# Patient Record
Sex: Male | Born: 1935 | Race: White | Hispanic: No | Marital: Married | Smoking: Former smoker
Health system: Southern US, Community
[De-identification: ages and names within clinical notes are randomized; demographics above are authoritative.]

## PROBLEM LIST (undated history)

## (undated) DIAGNOSIS — I2510 Atherosclerotic heart disease of native coronary artery without angina pectoris: Secondary | ICD-10-CM

## (undated) DIAGNOSIS — J449 Chronic obstructive pulmonary disease, unspecified: Secondary | ICD-10-CM

## (undated) DIAGNOSIS — J9601 Acute respiratory failure with hypoxia: Secondary | ICD-10-CM

## (undated) DIAGNOSIS — I495 Sick sinus syndrome: Secondary | ICD-10-CM

## (undated) DIAGNOSIS — J45909 Unspecified asthma, uncomplicated: Secondary | ICD-10-CM

## (undated) DIAGNOSIS — N200 Calculus of kidney: Secondary | ICD-10-CM

## (undated) DIAGNOSIS — I499 Cardiac arrhythmia, unspecified: Secondary | ICD-10-CM

## (undated) DIAGNOSIS — D631 Anemia in chronic kidney disease: Secondary | ICD-10-CM

## (undated) DIAGNOSIS — D472 Monoclonal gammopathy: Secondary | ICD-10-CM

## (undated) DIAGNOSIS — N189 Chronic kidney disease, unspecified: Secondary | ICD-10-CM

## (undated) DIAGNOSIS — I951 Orthostatic hypotension: Secondary | ICD-10-CM

## (undated) DIAGNOSIS — I249 Acute ischemic heart disease, unspecified: Secondary | ICD-10-CM

## (undated) DIAGNOSIS — R0602 Shortness of breath: Secondary | ICD-10-CM

## (undated) DIAGNOSIS — D509 Iron deficiency anemia, unspecified: Secondary | ICD-10-CM

## (undated) DIAGNOSIS — I480 Paroxysmal atrial fibrillation: Secondary | ICD-10-CM

## (undated) DIAGNOSIS — I672 Cerebral atherosclerosis: Secondary | ICD-10-CM

## (undated) HISTORY — DX: Chronic kidney disease, unspecified: N18.9

## (undated) HISTORY — PX: CATARACT EXTRACTION: SUR2

## (undated) HISTORY — DX: Cerebral atherosclerosis: I67.2

## (undated) HISTORY — DX: Monoclonal gammopathy: D47.2

## (undated) HISTORY — DX: Iron deficiency anemia, unspecified: D50.9

## (undated) HISTORY — DX: Sick sinus syndrome: I49.5

## (undated) HISTORY — DX: Unspecified asthma, uncomplicated: J45.909

## (undated) HISTORY — PX: INNER EAR SURGERY: SHX679

## (undated) HISTORY — PX: KIDNEY STONE SURGERY: SHX686

## (undated) HISTORY — DX: Atherosclerotic heart disease of native coronary artery without angina pectoris: I25.10

## (undated) HISTORY — DX: Paroxysmal atrial fibrillation: I48.0

## (undated) HISTORY — DX: Orthostatic hypotension: I95.1

## (undated) HISTORY — DX: Anemia in chronic kidney disease: D63.1

## (undated) HISTORY — DX: Chronic obstructive pulmonary disease, unspecified: J44.9

---

## 1998-11-04 ENCOUNTER — Encounter: Payer: Self-pay | Admitting: Emergency Medicine

## 1998-11-04 ENCOUNTER — Inpatient Hospital Stay (HOSPITAL_COMMUNITY): Admission: EM | Admit: 1998-11-04 | Discharge: 1998-11-05 | Payer: Self-pay | Admitting: Emergency Medicine

## 2000-11-05 ENCOUNTER — Emergency Department (HOSPITAL_COMMUNITY): Admission: EM | Admit: 2000-11-05 | Discharge: 2000-11-05 | Payer: Self-pay | Admitting: Emergency Medicine

## 2000-11-05 ENCOUNTER — Encounter: Payer: Self-pay | Admitting: Emergency Medicine

## 2002-08-24 ENCOUNTER — Encounter: Payer: Self-pay | Admitting: *Deleted

## 2002-08-24 ENCOUNTER — Inpatient Hospital Stay (HOSPITAL_COMMUNITY): Admission: EM | Admit: 2002-08-24 | Discharge: 2002-08-31 | Payer: Self-pay | Admitting: Emergency Medicine

## 2002-08-24 ENCOUNTER — Encounter: Payer: Self-pay | Admitting: Emergency Medicine

## 2002-08-27 ENCOUNTER — Encounter: Payer: Self-pay | Admitting: Internal Medicine

## 2002-09-19 ENCOUNTER — Observation Stay (HOSPITAL_COMMUNITY): Admission: EM | Admit: 2002-09-19 | Discharge: 2002-09-20 | Payer: Self-pay | Admitting: Emergency Medicine

## 2003-05-25 ENCOUNTER — Emergency Department (HOSPITAL_COMMUNITY): Admission: EM | Admit: 2003-05-25 | Discharge: 2003-05-25 | Payer: Self-pay | Admitting: Emergency Medicine

## 2005-12-05 ENCOUNTER — Inpatient Hospital Stay (HOSPITAL_COMMUNITY): Admission: AD | Admit: 2005-12-05 | Discharge: 2005-12-11 | Payer: Self-pay | Admitting: Cardiovascular Disease

## 2005-12-05 HISTORY — PX: CARDIAC CATHETERIZATION: SHX172

## 2005-12-06 HISTORY — PX: CORONARY ARTERY BYPASS GRAFT: SHX141

## 2005-12-26 ENCOUNTER — Emergency Department (HOSPITAL_COMMUNITY): Admission: EM | Admit: 2005-12-26 | Discharge: 2005-12-26 | Payer: Self-pay | Admitting: Emergency Medicine

## 2006-01-28 ENCOUNTER — Encounter: Admission: RE | Admit: 2006-01-28 | Discharge: 2006-01-28 | Payer: Self-pay | Admitting: Surgery

## 2006-06-06 ENCOUNTER — Ambulatory Visit (HOSPITAL_COMMUNITY): Admission: RE | Admit: 2006-06-06 | Discharge: 2006-06-06 | Payer: Self-pay | Admitting: Urology

## 2007-09-07 ENCOUNTER — Encounter: Payer: Self-pay | Admitting: Emergency Medicine

## 2008-02-17 ENCOUNTER — Ambulatory Visit (HOSPITAL_COMMUNITY): Admission: RE | Admit: 2008-02-17 | Discharge: 2008-02-17 | Payer: Self-pay | Admitting: Cardiovascular Disease

## 2008-02-17 ENCOUNTER — Encounter: Payer: Self-pay | Admitting: Emergency Medicine

## 2008-02-18 ENCOUNTER — Encounter: Payer: Self-pay | Admitting: Emergency Medicine

## 2008-02-25 ENCOUNTER — Encounter: Payer: Self-pay | Admitting: Emergency Medicine

## 2008-03-11 ENCOUNTER — Ambulatory Visit: Payer: Self-pay | Admitting: Emergency Medicine

## 2008-03-11 DIAGNOSIS — J449 Chronic obstructive pulmonary disease, unspecified: Secondary | ICD-10-CM

## 2008-03-11 DIAGNOSIS — J4489 Other specified chronic obstructive pulmonary disease: Secondary | ICD-10-CM | POA: Insufficient documentation

## 2008-03-11 DIAGNOSIS — J438 Other emphysema: Secondary | ICD-10-CM | POA: Insufficient documentation

## 2008-03-16 ENCOUNTER — Ambulatory Visit: Payer: Self-pay | Admitting: Emergency Medicine

## 2008-03-18 ENCOUNTER — Telehealth (INDEPENDENT_AMBULATORY_CARE_PROVIDER_SITE_OTHER): Payer: Self-pay | Admitting: *Deleted

## 2008-04-06 ENCOUNTER — Ambulatory Visit: Payer: Self-pay | Admitting: Emergency Medicine

## 2008-04-06 DIAGNOSIS — J309 Allergic rhinitis, unspecified: Secondary | ICD-10-CM | POA: Insufficient documentation

## 2008-05-11 ENCOUNTER — Ambulatory Visit: Payer: Self-pay | Admitting: Emergency Medicine

## 2008-05-16 ENCOUNTER — Telehealth (INDEPENDENT_AMBULATORY_CARE_PROVIDER_SITE_OTHER): Payer: Self-pay | Admitting: *Deleted

## 2008-06-06 ENCOUNTER — Encounter: Payer: Self-pay | Admitting: Emergency Medicine

## 2008-11-16 ENCOUNTER — Ambulatory Visit: Payer: Self-pay | Admitting: Emergency Medicine

## 2008-12-17 ENCOUNTER — Emergency Department (HOSPITAL_COMMUNITY): Admission: EM | Admit: 2008-12-17 | Discharge: 2008-12-17 | Payer: Self-pay | Admitting: Emergency Medicine

## 2008-12-19 ENCOUNTER — Telehealth (INDEPENDENT_AMBULATORY_CARE_PROVIDER_SITE_OTHER): Payer: Self-pay | Admitting: *Deleted

## 2008-12-21 ENCOUNTER — Ambulatory Visit: Payer: Self-pay | Admitting: Internal Medicine

## 2009-01-12 ENCOUNTER — Ambulatory Visit: Payer: Self-pay | Admitting: Emergency Medicine

## 2009-05-06 ENCOUNTER — Emergency Department (HOSPITAL_COMMUNITY): Admission: EM | Admit: 2009-05-06 | Discharge: 2009-05-06 | Payer: Self-pay | Admitting: Emergency Medicine

## 2009-05-15 ENCOUNTER — Telehealth (INDEPENDENT_AMBULATORY_CARE_PROVIDER_SITE_OTHER): Payer: Self-pay | Admitting: *Deleted

## 2009-06-01 ENCOUNTER — Ambulatory Visit: Payer: Self-pay | Admitting: Emergency Medicine

## 2009-07-16 ENCOUNTER — Inpatient Hospital Stay (HOSPITAL_COMMUNITY): Admission: EM | Admit: 2009-07-16 | Discharge: 2009-07-18 | Payer: Self-pay | Admitting: Emergency Medicine

## 2009-11-06 ENCOUNTER — Ambulatory Visit: Payer: Self-pay | Admitting: Emergency Medicine

## 2010-03-14 ENCOUNTER — Encounter: Admission: RE | Admit: 2010-03-14 | Discharge: 2010-03-14 | Payer: Self-pay | Admitting: Otolaryngology

## 2010-05-23 ENCOUNTER — Emergency Department (HOSPITAL_COMMUNITY): Admission: EM | Admit: 2010-05-23 | Discharge: 2010-05-23 | Payer: Self-pay | Admitting: Emergency Medicine

## 2010-05-25 ENCOUNTER — Ambulatory Visit: Payer: Self-pay | Admitting: Emergency Medicine

## 2010-10-01 ENCOUNTER — Inpatient Hospital Stay (HOSPITAL_COMMUNITY): Admission: EM | Admit: 2010-10-01 | Discharge: 2010-10-06 | Payer: Self-pay | Admitting: Emergency Medicine

## 2010-10-02 ENCOUNTER — Ambulatory Visit: Payer: Self-pay | Admitting: Vascular Surgery

## 2010-10-02 ENCOUNTER — Encounter (INDEPENDENT_AMBULATORY_CARE_PROVIDER_SITE_OTHER): Payer: Self-pay | Admitting: Family Medicine

## 2010-12-26 ENCOUNTER — Inpatient Hospital Stay (HOSPITAL_COMMUNITY)
Admission: EM | Admit: 2010-12-26 | Discharge: 2011-01-12 | Disposition: A | Discharge status: Nursing Home (Includes ICF) | Payer: Self-pay | Source: Home / Self Care | Attending: Internal Medicine | Admitting: Internal Medicine

## 2011-01-02 ENCOUNTER — Encounter (INDEPENDENT_AMBULATORY_CARE_PROVIDER_SITE_OTHER): Payer: Self-pay | Admitting: Internal Medicine

## 2011-01-02 LAB — BASIC METABOLIC PANEL
BUN: 13 mg/dL (ref 6–23)
CO2: 23 mEq/L (ref 19–32)
Calcium: 7.9 mg/dL — ABNORMAL LOW (ref 8.4–10.5)
Chloride: 107 mEq/L (ref 96–112)
Creatinine, Ser: 2.5 mg/dL — ABNORMAL HIGH (ref 0.4–1.5)
GFR calc Af Amer: 30 mL/min — ABNORMAL LOW (ref >60)
GFR calc non Af Amer: 25 mL/min — ABNORMAL LOW (ref >60)
Glucose, Bld: 117 mg/dL — ABNORMAL HIGH (ref 70–99)
Potassium: 3.4 mEq/L — ABNORMAL LOW (ref 3.5–5.1)
Sodium: 136 mEq/L (ref 135–145)

## 2011-01-03 LAB — BASIC METABOLIC PANEL
BUN: 14 mg/dL (ref 6–23)
CO2: 23 mEq/L (ref 19–32)
Calcium: 7.9 mg/dL — ABNORMAL LOW (ref 8.4–10.5)
Chloride: 108 mEq/L (ref 96–112)
Creatinine, Ser: 2.48 mg/dL — ABNORMAL HIGH (ref 0.4–1.5)
GFR calc Af Amer: 31 mL/min — ABNORMAL LOW (ref >60)
GFR calc non Af Amer: 25 mL/min — ABNORMAL LOW (ref >60)
Glucose, Bld: 136 mg/dL — ABNORMAL HIGH (ref 70–99)
Potassium: 3.7 mEq/L (ref 3.5–5.1)
Sodium: 137 mEq/L (ref 135–145)

## 2011-01-03 LAB — CBC
HCT: 31.2 % — ABNORMAL LOW (ref 39.0–52.0)
Hemoglobin: 10.4 g/dL — ABNORMAL LOW (ref 13.0–17.0)
MCH: 30.1 pg (ref 26.0–34.0)
MCHC: 33.3 g/dL (ref 30.0–36.0)
MCV: 90.4 fL (ref 78.0–100.0)
Platelets: 174 10*3/uL (ref 150–400)
RBC: 3.45 MIL/uL — ABNORMAL LOW (ref 4.22–5.81)
RDW: 13.3 % (ref 11.5–15.5)
WBC: 9.6 10*3/uL (ref 4.0–10.5)

## 2011-01-03 LAB — CREATININE, URINE, RANDOM: Creatinine, Urine: 66.3 mg/dL

## 2011-01-03 LAB — SODIUM, URINE, RANDOM: Sodium, Ur: 153 mEq/L

## 2011-01-04 LAB — BASIC METABOLIC PANEL
BUN: 13 mg/dL (ref 6–23)
CO2: 26 mEq/L (ref 19–32)
Calcium: 7.9 mg/dL — ABNORMAL LOW (ref 8.4–10.5)
Chloride: 108 mEq/L (ref 96–112)
Creatinine, Ser: 2.49 mg/dL — ABNORMAL HIGH (ref 0.4–1.5)
GFR calc Af Amer: 30 mL/min — ABNORMAL LOW (ref >60)
GFR calc non Af Amer: 25 mL/min — ABNORMAL LOW (ref >60)
Glucose, Bld: 119 mg/dL — ABNORMAL HIGH (ref 70–99)
Potassium: 3.6 mEq/L (ref 3.5–5.1)
Sodium: 139 mEq/L (ref 135–145)

## 2011-01-04 LAB — CBC
HCT: 29.4 % — ABNORMAL LOW (ref 39.0–52.0)
Hemoglobin: 9.7 g/dL — ABNORMAL LOW (ref 13.0–17.0)
MCH: 29.9 pg (ref 26.0–34.0)
MCHC: 33 g/dL (ref 30.0–36.0)
MCV: 90.7 fL (ref 78.0–100.0)
Platelets: 197 10*3/uL (ref 150–400)
RBC: 3.24 MIL/uL — ABNORMAL LOW (ref 4.22–5.81)
RDW: 13.4 % (ref 11.5–15.5)
WBC: 10.1 10*3/uL (ref 4.0–10.5)

## 2011-01-12 ENCOUNTER — Inpatient Hospital Stay
Admission: RE | Admit: 2011-01-12 | Discharge: 2011-02-07 | Disposition: A | Discharge status: Skilled Nursing Facility | Payer: Self-pay | Attending: Internal Medicine | Admitting: Internal Medicine

## 2011-01-14 LAB — PROTEIN ELECTROPHORESIS, SERUM
Albumin ELP: 48 % — ABNORMAL LOW (ref 55.8–66.1)
Alpha-1-Globulin: 8.7 % — ABNORMAL HIGH (ref 2.9–4.9)
Alpha-2-Globulin: 18.6 % — ABNORMAL HIGH (ref 7.1–11.8)
Beta 2: 4 % (ref 3.2–6.5)
Beta Globulin: 5.2 % (ref 4.7–7.2)
Gamma Globulin: 15.5 % (ref 11.1–18.8)
M-Spike, %: 0.24 g/dL
Total Protein ELP: 4.6 g/dL — ABNORMAL LOW (ref 6.0–8.3)

## 2011-01-14 LAB — BASIC METABOLIC PANEL
BUN: 16 mg/dL (ref 6–23)
BUN: 20 mg/dL (ref 6–23)
BUN: 17 mg/dL (ref 6–23)
BUN: 15 mg/dL (ref 6–23)
BUN: 15 mg/dL (ref 6–23)
BUN: 13 mg/dL (ref 6–23)
CO2: 28 mEq/L (ref 19–32)
CO2: 27 mEq/L (ref 19–32)
CO2: 25 mEq/L (ref 19–32)
CO2: 26 mEq/L (ref 19–32)
CO2: 25 mEq/L (ref 19–32)
CO2: 26 mEq/L (ref 19–32)
Calcium: 8.1 mg/dL — ABNORMAL LOW (ref 8.4–10.5)
Calcium: 8 mg/dL — ABNORMAL LOW (ref 8.4–10.5)
Calcium: 8.3 mg/dL — ABNORMAL LOW (ref 8.4–10.5)
Calcium: 8 mg/dL — ABNORMAL LOW (ref 8.4–10.5)
Calcium: 8 mg/dL — ABNORMAL LOW (ref 8.4–10.5)
Calcium: 8.2 mg/dL — ABNORMAL LOW (ref 8.4–10.5)
Chloride: 101 mEq/L (ref 96–112)
Chloride: 106 mEq/L (ref 96–112)
Chloride: 105 mEq/L (ref 96–112)
Chloride: 107 mEq/L (ref 96–112)
Chloride: 105 mEq/L (ref 96–112)
Chloride: 108 mEq/L (ref 96–112)
Creatinine, Ser: 2.74 mg/dL — ABNORMAL HIGH (ref 0.4–1.5)
Creatinine, Ser: 2.64 mg/dL — ABNORMAL HIGH (ref 0.4–1.5)
Creatinine, Ser: 2.35 mg/dL — ABNORMAL HIGH (ref 0.4–1.5)
Creatinine, Ser: 1.79 mg/dL — ABNORMAL HIGH (ref 0.4–1.5)
Creatinine, Ser: 1.48 mg/dL (ref 0.4–1.5)
Creatinine, Ser: 1.35 mg/dL (ref 0.4–1.5)
GFR calc Af Amer: 27 mL/min — ABNORMAL LOW (ref >60)
GFR calc Af Amer: 28 mL/min — ABNORMAL LOW (ref >60)
GFR calc Af Amer: 33 mL/min — ABNORMAL LOW (ref >60)
GFR calc Af Amer: 45 mL/min — ABNORMAL LOW (ref >60)
GFR calc Af Amer: 55 mL/min — ABNORMAL LOW (ref >60)
GFR calc Af Amer: >60 mL/min (ref >60)
GFR calc non Af Amer: 23 mL/min — ABNORMAL LOW (ref >60)
GFR calc non Af Amer: 24 mL/min — ABNORMAL LOW (ref >60)
GFR calc non Af Amer: 27 mL/min — ABNORMAL LOW (ref >60)
GFR calc non Af Amer: 37 mL/min — ABNORMAL LOW (ref >60)
GFR calc non Af Amer: 46 mL/min — ABNORMAL LOW (ref >60)
GFR calc non Af Amer: 51 mL/min — ABNORMAL LOW (ref >60)
Glucose, Bld: 117 mg/dL — ABNORMAL HIGH (ref 70–99)
Glucose, Bld: 111 mg/dL — ABNORMAL HIGH (ref 70–99)
Glucose, Bld: 115 mg/dL — ABNORMAL HIGH (ref 70–99)
Glucose, Bld: 113 mg/dL — ABNORMAL HIGH (ref 70–99)
Glucose, Bld: 116 mg/dL — ABNORMAL HIGH (ref 70–99)
Glucose, Bld: 103 mg/dL — ABNORMAL HIGH (ref 70–99)
Potassium: 3.3 mEq/L — ABNORMAL LOW (ref 3.5–5.1)
Potassium: 3.5 mEq/L (ref 3.5–5.1)
Potassium: 4.2 mEq/L (ref 3.5–5.1)
Potassium: 3.2 mEq/L — ABNORMAL LOW (ref 3.5–5.1)
Potassium: 3.1 mEq/L — ABNORMAL LOW (ref 3.5–5.1)
Potassium: 3.2 mEq/L — ABNORMAL LOW (ref 3.5–5.1)
Sodium: 137 mEq/L (ref 135–145)
Sodium: 141 mEq/L (ref 135–145)
Sodium: 137 mEq/L (ref 135–145)
Sodium: 140 mEq/L (ref 135–145)
Sodium: 138 mEq/L (ref 135–145)
Sodium: 140 mEq/L (ref 135–145)

## 2011-01-14 LAB — CBC
HCT: 30.1 % — ABNORMAL LOW (ref 39.0–52.0)
HCT: 34.6 % — ABNORMAL LOW (ref 39.0–52.0)
HCT: 29 % — ABNORMAL LOW (ref 39.0–52.0)
HCT: 30.1 % — ABNORMAL LOW (ref 39.0–52.0)
HCT: 29.1 % — ABNORMAL LOW (ref 39.0–52.0)
HCT: 30.4 % — ABNORMAL LOW (ref 39.0–52.0)
HCT: 28.4 % — ABNORMAL LOW (ref 39.0–52.0)
Hemoglobin: 9.8 g/dL — ABNORMAL LOW (ref 13.0–17.0)
Hemoglobin: 11.2 g/dL — ABNORMAL LOW (ref 13.0–17.0)
Hemoglobin: 9.5 g/dL — ABNORMAL LOW (ref 13.0–17.0)
Hemoglobin: 9.7 g/dL — ABNORMAL LOW (ref 13.0–17.0)
Hemoglobin: 9.5 g/dL — ABNORMAL LOW (ref 13.0–17.0)
Hemoglobin: 9.9 g/dL — ABNORMAL LOW (ref 13.0–17.0)
Hemoglobin: 9.2 g/dL — ABNORMAL LOW (ref 13.0–17.0)
MCH: 29.6 pg (ref 26.0–34.0)
MCH: 29.5 pg (ref 26.0–34.0)
MCH: 30.1 pg (ref 26.0–34.0)
MCH: 29.5 pg (ref 26.0–34.0)
MCH: 29.5 pg (ref 26.0–34.0)
MCH: 29.6 pg (ref 26.0–34.0)
MCH: 29.3 pg (ref 26.0–34.0)
MCHC: 32.6 g/dL (ref 30.0–36.0)
MCHC: 32.4 g/dL (ref 30.0–36.0)
MCHC: 32.8 g/dL (ref 30.0–36.0)
MCHC: 32.2 g/dL (ref 30.0–36.0)
MCHC: 32.6 g/dL (ref 30.0–36.0)
MCHC: 32.6 g/dL (ref 30.0–36.0)
MCHC: 32.4 g/dL (ref 30.0–36.0)
MCV: 90.9 fL (ref 78.0–100.0)
MCV: 91.1 fL (ref 78.0–100.0)
MCV: 91.8 fL (ref 78.0–100.0)
MCV: 91.5 fL (ref 78.0–100.0)
MCV: 90.4 fL (ref 78.0–100.0)
MCV: 91 fL (ref 78.0–100.0)
MCV: 90.4 fL (ref 78.0–100.0)
Platelets: 248 10*3/uL (ref 150–400)
Platelets: 298 10*3/uL (ref 150–400)
Platelets: 291 10*3/uL (ref 150–400)
Platelets: 341 10*3/uL (ref 150–400)
Platelets: 335 10*3/uL (ref 150–400)
Platelets: 356 10*3/uL (ref 150–400)
Platelets: 373 10*3/uL (ref 150–400)
RBC: 3.31 MIL/uL — ABNORMAL LOW (ref 4.22–5.81)
RBC: 3.8 MIL/uL — ABNORMAL LOW (ref 4.22–5.81)
RBC: 3.16 MIL/uL — ABNORMAL LOW (ref 4.22–5.81)
RBC: 3.29 MIL/uL — ABNORMAL LOW (ref 4.22–5.81)
RBC: 3.22 MIL/uL — ABNORMAL LOW (ref 4.22–5.81)
RBC: 3.34 MIL/uL — ABNORMAL LOW (ref 4.22–5.81)
RBC: 3.14 MIL/uL — ABNORMAL LOW (ref 4.22–5.81)
RDW: 13.6 % (ref 11.5–15.5)
RDW: 13.8 % (ref 11.5–15.5)
RDW: 13.9 % (ref 11.5–15.5)
RDW: 14 % (ref 11.5–15.5)
RDW: 14.1 % (ref 11.5–15.5)
RDW: 14.5 % (ref 11.5–15.5)
RDW: 14.2 % (ref 11.5–15.5)
WBC: 10.7 10*3/uL — ABNORMAL HIGH (ref 4.0–10.5)
WBC: 12.7 10*3/uL — ABNORMAL HIGH (ref 4.0–10.5)
WBC: 10.9 10*3/uL — ABNORMAL HIGH (ref 4.0–10.5)
WBC: 13.4 10*3/uL — ABNORMAL HIGH (ref 4.0–10.5)
WBC: 10.4 10*3/uL (ref 4.0–10.5)
WBC: 9.6 10*3/uL (ref 4.0–10.5)
WBC: 8.5 10*3/uL (ref 4.0–10.5)

## 2011-01-14 LAB — COMPREHENSIVE METABOLIC PANEL
ALT: 25 U/L (ref 0–53)
ALT: 19 U/L (ref 0–53)
AST: 28 U/L (ref 0–37)
AST: 23 U/L (ref 0–37)
Albumin: 2.4 g/dL — ABNORMAL LOW (ref 3.5–5.2)
Albumin: 2.1 g/dL — ABNORMAL LOW (ref 3.5–5.2)
Alkaline Phosphatase: 90 U/L (ref 39–117)
Alkaline Phosphatase: 101 U/L (ref 39–117)
BUN: 17 mg/dL (ref 6–23)
BUN: 19 mg/dL (ref 6–23)
CO2: 25 mEq/L (ref 19–32)
CO2: 23 mEq/L (ref 19–32)
Calcium: 8.2 mg/dL — ABNORMAL LOW (ref 8.4–10.5)
Calcium: 8 mg/dL — ABNORMAL LOW (ref 8.4–10.5)
Chloride: 106 mEq/L (ref 96–112)
Chloride: 108 mEq/L (ref 96–112)
Creatinine, Ser: 2.47 mg/dL — ABNORMAL HIGH (ref 0.4–1.5)
Creatinine, Ser: 2.41 mg/dL — ABNORMAL HIGH (ref 0.4–1.5)
GFR calc Af Amer: 31 mL/min — ABNORMAL LOW (ref >60)
GFR calc Af Amer: 32 mL/min — ABNORMAL LOW (ref >60)
GFR calc non Af Amer: 25 mL/min — ABNORMAL LOW (ref >60)
GFR calc non Af Amer: 26 mL/min — ABNORMAL LOW (ref >60)
Glucose, Bld: 108 mg/dL — ABNORMAL HIGH (ref 70–99)
Glucose, Bld: 110 mg/dL — ABNORMAL HIGH (ref 70–99)
Potassium: 3.4 mEq/L — ABNORMAL LOW (ref 3.5–5.1)
Potassium: 3.5 mEq/L (ref 3.5–5.1)
Sodium: 139 mEq/L (ref 135–145)
Sodium: 139 mEq/L (ref 135–145)
Total Bilirubin: 0.6 mg/dL (ref 0.3–1.2)
Total Bilirubin: 0.6 mg/dL (ref 0.3–1.2)
Total Protein: 5.5 g/dL — ABNORMAL LOW (ref 6.0–8.3)
Total Protein: 4.7 g/dL — ABNORMAL LOW (ref 6.0–8.3)

## 2011-01-14 LAB — PHOSPHORUS
Phosphorus: 3.3 mg/dL (ref 2.3–4.6)
Phosphorus: 2.4 mg/dL (ref 2.3–4.6)
Phosphorus: 2.7 mg/dL (ref 2.3–4.6)

## 2011-01-14 LAB — LACTIC ACID, PLASMA: Lactic Acid, Venous: 0.9 mmol/L (ref 0.5–2.2)

## 2011-01-14 LAB — UIFE/LIGHT CHAINS/TP QN, 24-HR UR
Albumin, U: DETECTED
Alpha 1, Urine: DETECTED — AB
Alpha 2, Urine: DETECTED — AB
Beta, Urine: DETECTED — AB
Free Kappa Lt Chains,Ur: 39.2 mg/dL — ABNORMAL HIGH (ref 0.04–1.51)
Free Kappa/Lambda Ratio: 2.48 ratio (ref 0.46–4.00)
Free Lambda Excretion/Day: 288.35 mg/d
Free Lambda Lt Chains,Ur: 15.8 mg/dL — ABNORMAL HIGH (ref 0.08–1.01)
Free Lt Chn Excr Rate: 715.4 mg/d
Gamma Globulin, Urine: DETECTED — AB
Time: 24 hours
Total Protein, Urine-Ur/day: 1088 mg/d — ABNORMAL HIGH (ref 10–140)
Total Protein, Urine: 59.6 mg/dL
Volume, Urine: 1825 mL

## 2011-01-14 LAB — AMYLASE: Amylase: 37 U/L (ref 0–105)

## 2011-01-14 LAB — MAGNESIUM
Magnesium: 1.4 mg/dL — ABNORMAL LOW (ref 1.5–2.5)
Magnesium: 1.9 mg/dL (ref 1.5–2.5)

## 2011-01-14 LAB — BRAIN NATRIURETIC PEPTIDE: Pro B Natriuretic peptide (BNP): 221 pg/mL — ABNORMAL HIGH (ref 0.0–100.0)

## 2011-01-14 LAB — LIPASE, BLOOD: Lipase: 23 U/L (ref 11–59)

## 2011-01-14 LAB — URIC ACID: Uric Acid, Serum: 4 mg/dL (ref 4.0–7.8)

## 2011-01-14 LAB — ALBUMIN: Albumin: 2.2 g/dL — ABNORMAL LOW (ref 3.5–5.2)

## 2011-01-16 LAB — BASIC METABOLIC PANEL
BUN: 12 mg/dL (ref 6–23)
BUN: 15 mg/dL (ref 6–23)
CO2: 23 mEq/L (ref 19–32)
CO2: 24 mEq/L (ref 19–32)
Calcium: 8.2 mg/dL — ABNORMAL LOW (ref 8.4–10.5)
Calcium: 8.7 mg/dL (ref 8.4–10.5)
Chloride: 105 mEq/L (ref 96–112)
Chloride: 109 mEq/L (ref 96–112)
Creatinine, Ser: 1.26 mg/dL (ref 0.4–1.5)
Creatinine, Ser: 1.26 mg/dL (ref 0.4–1.5)
GFR calc Af Amer: >60 mL/min (ref >60)
GFR calc Af Amer: >60 mL/min (ref >60)
GFR calc non Af Amer: 55 mL/min — ABNORMAL LOW (ref >60)
GFR calc non Af Amer: 55 mL/min — ABNORMAL LOW (ref >60)
Glucose, Bld: 103 mg/dL — ABNORMAL HIGH (ref 70–99)
Glucose, Bld: 95 mg/dL (ref 70–99)
Potassium: 3.3 mEq/L — ABNORMAL LOW (ref 3.5–5.1)
Potassium: 4.1 mEq/L (ref 3.5–5.1)
Sodium: 136 mEq/L (ref 135–145)
Sodium: 139 mEq/L (ref 135–145)

## 2011-01-16 LAB — MAGNESIUM: Magnesium: 1.6 mg/dL (ref 1.5–2.5)

## 2011-01-16 LAB — PREALBUMIN: Prealbumin: 22.8 mg/dL (ref 17.0–34.0)

## 2011-01-16 LAB — CBC
HCT: 29.1 % — ABNORMAL LOW (ref 39.0–52.0)
HCT: 29.6 % — ABNORMAL LOW (ref 39.0–52.0)
Hemoglobin: 9.4 g/dL — ABNORMAL LOW (ref 13.0–17.0)
Hemoglobin: 9.7 g/dL — ABNORMAL LOW (ref 13.0–17.0)
MCH: 29.3 pg (ref 26.0–34.0)
MCH: 29.5 pg (ref 26.0–34.0)
MCHC: 32.3 g/dL (ref 30.0–36.0)
MCHC: 32.8 g/dL (ref 30.0–36.0)
MCV: 90.7 fL (ref 78.0–100.0)
MCV: 90 fL (ref 78.0–100.0)
Platelets: 381 10*3/uL (ref 150–400)
Platelets: 383 10*3/uL (ref 150–400)
RBC: 3.21 MIL/uL — ABNORMAL LOW (ref 4.22–5.81)
RBC: 3.29 MIL/uL — ABNORMAL LOW (ref 4.22–5.81)
RDW: 14.3 % (ref 11.5–15.5)
RDW: 14.4 % (ref 11.5–15.5)
WBC: 7.6 10*3/uL (ref 4.0–10.5)
WBC: 7 10*3/uL (ref 4.0–10.5)

## 2011-01-16 LAB — PHOSPHORUS: Phosphorus: 3.4 mg/dL (ref 2.3–4.6)

## 2011-01-16 LAB — CLOSTRIDIUM DIFFICILE BY PCR: Toxigenic C. Difficile by PCR: NEGATIVE

## 2011-01-21 LAB — CBC
HCT: 30.3 % — ABNORMAL LOW (ref 39.0–52.0)
Hemoglobin: 9.5 g/dL — ABNORMAL LOW (ref 13.0–17.0)
MCHC: 31.4 g/dL (ref 30.0–36.0)
MCV: 92.1 fL (ref 78.0–100.0)
Platelets: 335 10*3/uL (ref 150–400)
RBC: 3.29 MIL/uL — ABNORMAL LOW (ref 4.22–5.81)
RDW: 14.5 % (ref 11.5–15.5)
WBC: 5.7 10*3/uL (ref 4.0–10.5)

## 2011-01-22 LAB — BASIC METABOLIC PANEL
BUN: 15 mg/dL (ref 6–23)
CO2: 29 mEq/L (ref 19–32)
Calcium: 8.7 mg/dL (ref 8.4–10.5)
Chloride: 103 mEq/L (ref 96–112)
GFR calc Af Amer: >60 mL/min (ref >60)
GFR calc non Af Amer: 54 mL/min — ABNORMAL LOW (ref >60)
Glucose, Bld: 113 mg/dL — ABNORMAL HIGH (ref 70–99)
Potassium: 4.2 mEq/L (ref 3.5–5.1)
Sodium: 138 mEq/L (ref 135–145)

## 2011-01-24 LAB — CBC
HCT: 28.6 % — ABNORMAL LOW (ref 39.0–52.0)
Hemoglobin: 9.3 g/dL — ABNORMAL LOW (ref 13.0–17.0)
MCH: 30.2 pg (ref 26.0–34.0)
MCHC: 32.5 g/dL (ref 30.0–36.0)
MCV: 92.9 fL (ref 78.0–100.0)

## 2011-01-24 LAB — BASIC METABOLIC PANEL
CO2: 29 mEq/L (ref 19–32)
Calcium: 8.9 mg/dL (ref 8.4–10.5)
Creatinine, Ser: 1.18 mg/dL (ref 0.4–1.5)
Glucose, Bld: 106 mg/dL — ABNORMAL HIGH (ref 70–99)

## 2011-01-24 NOTE — Progress Notes (Addendum)
NAMEJAMARIEN, RODKEY            ACCOUNT NO.:  [id number]  MEDICAL RECORD NO.:  [id number]          PATIENT TYPE:  INP  LOCATION:  1535                         FACILITY:  [facility]  PHYSICIAN:  Penne Rosenstock I Laisa Larrick, MD      DATE OF BIRTH:  Feb 06, 1929                                PROGRESS NOTE   DISCHARGE DIAGNOSES: 1. Clostridium difficile colitis. 2. Acute renal failure on chronic renal failure.  Baseline creatinine     1.5.  Today creatinine is 2.4. 3. Left proximal ureteric 1-cm stone, caused moderate left-sided     hydronephrosis. 4. Bilateral renal collecting system calculi. 5. Leukocytosis. 6. Anemia. 7. Hypertension. 8. Coronary artery disease status post coronary artery bypass graft. 9. History of emphysema. 10.Possible bladder outlet obstruction.  MEDICATIONS: To be dictated at the date of actual discharge.  CONSULTATIONS: 1. Gastroenterology consulted. 2. Nephrology consulted done by [facility] C. Lowell Guitar, M.D. 3. Urology consulted done by Courtney Paris, M.D., the patient's     urologist.  HISTORY OF PRESENT ILLNESS: This is a 77 year old male with a history of coronary artery disease status post CABG, hypertension, hyperlipidemia, emphysema, recurrent kidney stones who presented with abdominal pain to the hospital.  On Sunday he tried to move his bowels.  He was not able to move after which he started developing abdominal pain.  Pain mainly in the left lower quadrant.  He denies any fever or chills.  He has not used any antibiotics recently.  The patient has a CT scan which shows colitis.  PROCEDURES: 1. CT abdomen and pelvis with contrast:  Diffuse thickening of the     colon most prominent in the cecum.  This is compatible with colitis     and could be due to infectious, possible ischemia. 2. X-ray of the abdomen:  Mild bowel thickening involving the cecum     and ascending colon without acute findings. 3. Ultrasound of the kidney:  No hydronephrosis.  Small  bilateral     cysts.  Some bilateral nonobstructive stones. 4. CT of the abdomen and pelvis without contrast:  Left proximal     ureteric 1-cm stone causing moderate left-sided hydronephrosis.     This study was __________ report, increased ascending colitis     without obstruction or abscess.  Development of a small volume     abdomen and pelvic ascites.  New small bilateral effusion. 5. KUB.  Irregularity of the colon may be related to under distention     and/or colitis.  Appearance is less notable than that seen on the     CT scan.  HOSPITAL COURSE: Abdominal pain.  Colitis versus ischemia.  The patient was admitted to the hospital and started on Cipro and Flagyl and a clear-liquid diet. Gastroenterology consulted and the patient had a stool study for Clostridium difficile, which ended up positive.  The patient was started on Flagyl and Gastroenterology signed off.  His bowel is formed, but the patient continued to complain of left lower quadrant abdominal pain. Status post another x-ray and on his examination the abdomen looked distended, with present bowel sounds.  No rebound.  No guarding.  A repeat abdominal x-ray was negative.  Also, note that the patient has worsening of his renal function.  Renal function felt to be could be secondary to contrast versus bladder outlet obstruction.  The patient had a bladder scan which did show 360 and Foley catheter in varices. The patient has a significant amount of urine.  The patient on nonoliguric phase of renal insufficiency.  We tried with the patient IV fluid to improve his renal function, but renal function remained the same.  Nephrology consulted and as per discussion with Dr. Lowell Guitar he thinks it will improve gradually.  Anyhow secondary to worsening of the patient's left lower quadrant abdominal pain, CT scan without contrast was done which did show left ureteric stone in addition to moderate hydronephrosis.  The patient's  urologist, Dr. Aldean Ast, did see the patient and a KUB was done.  Dr. Aldean Ast for now did not feel the pain is secondary to the ureteric stone, but per his recommendation, may need placement of a stent during hospital stay after reviewing the CT scan. Also, I had discussion with __________ of [place] for courtesy visit just to evaluate the patient, but I have discussed with the patient regarding his diarrhea and the nurses did not feel this.  It is more softer, it is greenish, and it is firm rather than diarrhea.  They are not so frequent.  There are just 2 bowel movements.  Yes, the patient has bowel movement on January 06, 2011, and this was felt to be secondary to the stool softener status post stopping them.  Accordingly, we will continue to monitor the patient during the hospital stay for further evaluation by Dr. Aldean Ast and if the kidney function did not improve most probably the patient will be discharged to follow up with his primary care physician.     Genice Kimberlin Bosie Helper, MD     HIE/MEDQ  D:  01/08/2011  T:  01/09/2011  Job:  [id number]  Electronically Signed by Ebony Cargo MD on 01/24/2011 01:59:30 PM

## 2011-01-26 LAB — BASIC METABOLIC PANEL
BUN: 12 mg/dL (ref 6–23)
Chloride: 104 mEq/L (ref 96–112)
Creatinine, Ser: 1.13 mg/dL (ref 0.4–1.5)

## 2011-01-26 LAB — CBC
MCH: 29.9 pg (ref 26.0–34.0)
MCV: 93 fL (ref 78.0–100.0)
Platelets: 163 10*3/uL (ref 150–400)
RBC: 2.98 MIL/uL — ABNORMAL LOW (ref 4.22–5.81)
RDW: 15.1 % (ref 11.5–15.5)

## 2011-01-27 LAB — CLOSTRIDIUM DIFFICILE BY PCR: Toxigenic C. Difficile by PCR: NEGATIVE

## 2011-01-28 LAB — BASIC METABOLIC PANEL
BUN: 11 mg/dL (ref 6–23)
Calcium: 8.7 mg/dL (ref 8.4–10.5)
Creatinine, Ser: 1.11 mg/dL (ref 0.4–1.5)
GFR calc non Af Amer: >60 mL/min (ref >60)
Glucose, Bld: 112 mg/dL — ABNORMAL HIGH (ref 70–99)
Sodium: 139 mEq/L (ref 135–145)

## 2011-01-28 LAB — CBC
HCT: 27 % — ABNORMAL LOW (ref 39.0–52.0)
MCH: 29.9 pg (ref 26.0–34.0)
MCHC: 32.2 g/dL (ref 30.0–36.0)
RDW: 15.1 % (ref 11.5–15.5)

## 2011-01-29 LAB — FECAL LACTOFERRIN, QUANT: Fecal Lactoferrin: NEGATIVE

## 2011-01-30 LAB — STOOL CULTURE

## 2011-01-31 LAB — BASIC METABOLIC PANEL
CO2: 29 mEq/L (ref 19–32)
Chloride: 102 mEq/L (ref 96–112)
GFR calc Af Amer: >60 mL/min (ref >60)
Potassium: 3.3 mEq/L — ABNORMAL LOW (ref 3.5–5.1)
Sodium: 142 mEq/L (ref 135–145)

## 2011-01-31 LAB — CBC
Hemoglobin: 9.7 g/dL — ABNORMAL LOW (ref 13.0–17.0)
Platelets: 192 10*3/uL (ref 150–400)
RBC: 3.28 MIL/uL — ABNORMAL LOW (ref 4.22–5.81)
WBC: 4.9 10*3/uL (ref 4.0–10.5)

## 2011-01-31 NOTE — Assessment & Plan Note (Signed)
Summary: COPD   Visit Type:  Follow-up Copy to:  Dr Tresa Endo Primary Provider/Referring Provider:  Dr. Nathanial Rancher in [place]  CC:  COPD.  The patient says he is more sob due to higher outside temperatures but overall seems to be doing well.Marland Kitchen  History of Present Illness: 77yo man with long tobacco hx, CAD/CABG (06) followed by Dr Tresa Endo.    01/12/09 - was restarted on Symbicort 160/4.5 two times a day by Dr Sherene Sires last time. Feels his breathing is better, not using ProAir frequently. Also on Spiriva once a day.   06/01/09 -- Regular follow up visit. Has been doing well since last visit. No exertional dyspnea, able to participate in [employer]. No wheezing, no coughing.   ROV 11/06/09 -- Returns today for regular follow up. Has been doing well, takes Spiriva + Symbicort. Stays very active. Hasn't required ProAir at all. Continues to follow with Dr Tresa Endo - he is due for a stress test and follow up. Has been having more bruising when he bumps his hands.    ROV 05/21/10 -- returns for f/u COPD. last time we changed Symbicort to 80/4.5 two times a day. Also on Spiriva. Still having SOB with exertion, like working in the shed, the yard. He has not had any exacerbations. Family is concerned that he may have lost some ground since last visit in exertional tolerance. Still competes in the [employer].    Current Medications (verified): 1)  Metoprolol Tartrate 25 Mg  Tabs (Metoprolol Tartrate) .... Take 1 Tablet By Mouth Two Times A Day 2)  Crestor 20 Mg Tabs (Rosuvastatin Calcium) .Marland Kitchen.. 1 By Mouth Daily 3)  Aspirin 81 Mg Tabs (Aspirin) .Marland Kitchen.. 1 By Mouth Daily 4)  Niaspan 500 Mg  Tbcr (Niacin (Antihyperlipidemic)) .... Take 1 Tablet By Mouth Once A Day 5)  Nitroquick 0.4 Mg  Subl (Nitroglycerin) .... As Needed 6)  Proair Hfa 108 (90 Base) Mcg/act  Aers (Albuterol Sulfate) .... Inhale 2 Puffs Every 4 Hours As Needed 7)  Spiriva Handihaler 18 Mcg  Caps (Tiotropium Bromide Monohydrate) .... Take 1 Capsule  Each  Am 8)  Symbicort 80-4.5 Mcg/act Aero (Budesonide-Formoterol Fumarate) .... 2 Puffs Two Times A Day 9)  Tamsulosin Hcl 0.4 Mg Caps (Tamsulosin Hcl) .Marland Kitchen.. 1 By Mouth Daily 10)  Cvs Spectravite Senior  Tabs (Multiple Vitamins-Minerals) .Marland Kitchen.. 1 By Mouth Daily  Allergies (verified): 1)  ! Diclofenac Sodium (Diclofenac Sodium)  Vital Signs:  Patient profile:   77 year old male Height:      71 inches (180.34 cm) Weight:      194 pounds (88.18 kg) BMI:     27.16 O2 Sat:      91 % on Room air Temp:     98.3 degrees F (36.83 degrees C) oral Pulse rate:   84 / minute BP sitting:   118 / 80  (right arm) Cuff size:   regular  Vitals Entered By: Michel Bickers CMA (May 25, 2010 12:06 PM)  O2 Sat at Rest %:  91 O2 Flow:  Room air  Serial Vital Signs/Assessments:  Comments: 12:32 PM Ambulatory Pulse Oximetry  Resting; HR__79___    02 Sat__93% on room air___  Lap1 (185 feet)   HR_88____   02 Sat__93% on room air___ Lap2 (185 feet)   HR__97___   02 Sat_91% on room air____    Lap3 (185 feet)   HR_99____   02 Sat__92% on room air___  _x__Test Completed without Difficulty ___Test Stopped due to:  By:  Michel Bickers CMA   CC: COPD.  The patient says he is more sob due to higher outside temperatures but overall seems to be doing well. Comments Medications reviewed.  Daytime phone verified. Michel Bickers CMA  May 25, 2010 12:07 PM   Physical Exam  General:  Tall thin man, NAD Head:  normocephalic and atraumatic Nose:  no deformity, discharge, inflammation, or lesions Mouth:  no deformity or lesions Neck:  no masses, thyromegaly, or abnormal cervical nodes Lungs:  Clear bilaterally, distant, no wheeze on forced expiration. Heart:  regular rate and rhythm, S1, S2 without murmurs, rubs, gallops, or clicks Abdomen:  benign Extremities:  no clubbing, cyanosis, edema, or deformity noted Neurologic:  non-focal Psych:  alert and cooperative; normal mood and affect; normal attention span and  concentration   Impression & Recommendations:  Problem # 1:  C O P D (ICD-496) Appears to be stable, although some concern by daughter that he is more SOB in the hot weather - continue current symbicort 80 + spiriva - walking oximetry  - rov in 6 months or as needed.   Medications Added to Medication List This Visit: 1)  Crestor 20 Mg Tabs (Rosuvastatin calcium) .Marland Kitchen.. 1 by mouth daily 2)  Aspirin 81 Mg Tabs (Aspirin) .Marland Kitchen.. 1 by mouth daily 3)  Tamsulosin Hcl 0.4 Mg Caps (Tamsulosin hcl) .Marland Kitchen.. 1 by mouth daily 4)  Cvs Spectravite Senior Tabs (Multiple vitamins-minerals) .Marland Kitchen.. 1 by mouth daily  Other Orders: Est. Patient Level IV (18841)  Patient Instructions: 1)  Continue your Symbicort 80/4.21mcg, 2 puffs two times a day  2)  Continue your Spiriva once daily  3)  Walking oximetry today showed that your oxygen stays in the normal range.  4)  Follow up with Dr Delton Coombes in 6 months or as needed for any other problems

## 2011-02-01 LAB — BASIC METABOLIC PANEL
CO2: 30 mEq/L (ref 19–32)
GFR calc Af Amer: >60 mL/min (ref >60)
Glucose, Bld: 114 mg/dL — ABNORMAL HIGH (ref 70–99)
Potassium: 3.3 mEq/L — ABNORMAL LOW (ref 3.5–5.1)
Sodium: 142 mEq/L (ref 135–145)

## 2011-02-02 LAB — CBC
HCT: 28.4 % — ABNORMAL LOW (ref 39.0–52.0)
Hemoglobin: 8.9 g/dL — ABNORMAL LOW (ref 13.0–17.0)
MCH: 29.5 pg (ref 26.0–34.0)
MCHC: 31.3 g/dL (ref 30.0–36.0)
RBC: 3.02 MIL/uL — ABNORMAL LOW (ref 4.22–5.81)

## 2011-02-02 LAB — BASIC METABOLIC PANEL
CO2: 28 mEq/L (ref 19–32)
Calcium: 8.6 mg/dL (ref 8.4–10.5)
Chloride: 103 mEq/L (ref 96–112)
GFR calc Af Amer: >60 mL/min (ref >60)
Glucose, Bld: 162 mg/dL — ABNORMAL HIGH (ref 70–99)
Sodium: 141 mEq/L (ref 135–145)

## 2011-02-03 LAB — BASIC METABOLIC PANEL
BUN: 11 mg/dL (ref 6–23)
CO2: 31 mEq/L (ref 19–32)
GFR calc non Af Amer: >60 mL/min (ref >60)
Glucose, Bld: 108 mg/dL — ABNORMAL HIGH (ref 70–99)
Potassium: 3.6 mEq/L (ref 3.5–5.1)
Sodium: 141 mEq/L (ref 135–145)

## 2011-02-06 LAB — BASIC METABOLIC PANEL
BUN: 15 mg/dL (ref 6–23)
CO2: 29 mEq/L (ref 19–32)
Calcium: 9 mg/dL (ref 8.4–10.5)
GFR calc non Af Amer: 59 mL/min — ABNORMAL LOW (ref >60)
Glucose, Bld: 108 mg/dL — ABNORMAL HIGH (ref 70–99)

## 2011-02-06 LAB — CBC
HCT: 29.2 % — ABNORMAL LOW (ref 39.0–52.0)
Hemoglobin: 9.2 g/dL — ABNORMAL LOW (ref 13.0–17.0)
MCH: 29.9 pg (ref 26.0–34.0)
MCHC: 31.5 g/dL (ref 30.0–36.0)
MCV: 94.8 fL (ref 78.0–100.0)
RDW: 15.4 % (ref 11.5–15.5)

## 2011-02-20 ENCOUNTER — Encounter: Payer: Self-pay | Admitting: Oncology

## 2011-03-11 LAB — BASIC METABOLIC PANEL
CO2: 23 mEq/L (ref 19–32)
CO2: 26 mEq/L (ref 19–32)
Calcium: 8.2 mg/dL — ABNORMAL LOW (ref 8.4–10.5)
Calcium: 8.4 mg/dL (ref 8.4–10.5)
Chloride: 109 mEq/L (ref 96–112)
Creatinine, Ser: 1.6 mg/dL — ABNORMAL HIGH (ref 0.4–1.5)
GFR calc Af Amer: 31 mL/min — ABNORMAL LOW (ref >60)
GFR calc Af Amer: 51 mL/min — ABNORMAL LOW (ref >60)
GFR calc non Af Amer: 26 mL/min — ABNORMAL LOW (ref >60)
GFR calc non Af Amer: 28 mL/min — ABNORMAL LOW (ref >60)
GFR calc non Af Amer: 45 mL/min — ABNORMAL LOW (ref >60)
Glucose, Bld: 117 mg/dL — ABNORMAL HIGH (ref 70–99)
Glucose, Bld: 118 mg/dL — ABNORMAL HIGH (ref 70–99)
Glucose, Bld: 133 mg/dL — ABNORMAL HIGH (ref 70–99)
Potassium: 3.5 mEq/L (ref 3.5–5.1)
Potassium: 3.7 mEq/L (ref 3.5–5.1)
Potassium: 3.8 mEq/L (ref 3.5–5.1)
Sodium: 137 mEq/L (ref 135–145)
Sodium: 137 mEq/L (ref 135–145)
Sodium: 138 mEq/L (ref 135–145)
Sodium: 138 mEq/L (ref 135–145)
Sodium: 139 mEq/L (ref 135–145)

## 2011-03-11 LAB — COMPREHENSIVE METABOLIC PANEL
ALT: 26 U/L (ref 0–53)
Albumin: 3.4 g/dL — ABNORMAL LOW (ref 3.5–5.2)
Alkaline Phosphatase: 59 U/L (ref 39–117)
Alkaline Phosphatase: 65 U/L (ref 39–117)
BUN: 14 mg/dL (ref 6–23)
Chloride: 106 mEq/L (ref 96–112)
Creatinine, Ser: 1.45 mg/dL (ref 0.4–1.5)
Glucose, Bld: 111 mg/dL — ABNORMAL HIGH (ref 70–99)
Potassium: 3.8 mEq/L (ref 3.5–5.1)
Potassium: 3.9 mEq/L (ref 3.5–5.1)
Sodium: 139 mEq/L (ref 135–145)
Total Bilirubin: 1.1 mg/dL (ref 0.3–1.2)
Total Protein: 6 g/dL (ref 6.0–8.3)
Total Protein: 6.8 g/dL (ref 6.0–8.3)

## 2011-03-11 LAB — CBC
HCT: 32.8 % — ABNORMAL LOW (ref 39.0–52.0)
HCT: 34.4 % — ABNORMAL LOW (ref 39.0–52.0)
HCT: 35.2 % — ABNORMAL LOW (ref 39.0–52.0)
HCT: 36.9 % — ABNORMAL LOW (ref 39.0–52.0)
Hemoglobin: 10.9 g/dL — ABNORMAL LOW (ref 13.0–17.0)
Hemoglobin: 11.4 g/dL — ABNORMAL LOW (ref 13.0–17.0)
Hemoglobin: 12.4 g/dL — ABNORMAL LOW (ref 13.0–17.0)
MCH: 30.1 pg (ref 26.0–34.0)
MCH: 30.1 pg (ref 26.0–34.0)
MCH: 30.9 pg (ref 26.0–34.0)
MCHC: 33 g/dL (ref 30.0–36.0)
MCHC: 33.1 g/dL (ref 30.0–36.0)
MCHC: 33.6 g/dL (ref 30.0–36.0)
MCV: 91.4 fL (ref 78.0–100.0)
MCV: 92 fL (ref 78.0–100.0)
MCV: 92.1 fL (ref 78.0–100.0)
Platelets: 151 10*3/uL (ref 150–400)
Platelets: 182 10*3/uL (ref 150–400)
RBC: 3.43 MIL/uL — ABNORMAL LOW (ref 4.22–5.81)
RBC: 4.01 MIL/uL — ABNORMAL LOW (ref 4.22–5.81)
RDW: 12.7 % (ref 11.5–15.5)
RDW: 12.8 % (ref 11.5–15.5)
WBC: 10.7 10*3/uL — ABNORMAL HIGH (ref 4.0–10.5)
WBC: 12.5 10*3/uL — ABNORMAL HIGH (ref 4.0–10.5)
WBC: 13.4 10*3/uL — ABNORMAL HIGH (ref 4.0–10.5)
WBC: 13.6 10*3/uL — ABNORMAL HIGH (ref 4.0–10.5)

## 2011-03-11 LAB — GLUCOSE, CAPILLARY: Glucose-Capillary: 105 mg/dL — ABNORMAL HIGH (ref 70–99)

## 2011-03-11 LAB — URINALYSIS, MICROSCOPIC ONLY
Bilirubin Urine: NEGATIVE
Glucose, UA: NEGATIVE mg/dL
Ketones, ur: NEGATIVE mg/dL
Leukocytes, UA: NEGATIVE
Nitrite: NEGATIVE
Protein, ur: NEGATIVE mg/dL
Specific Gravity, Urine: 1.008 (ref 1.005–1.030)
Urobilinogen, UA: 0.2 mg/dL (ref 0.0–1.0)
pH: 5.5 (ref 5.0–8.0)

## 2011-03-11 LAB — LIPASE, BLOOD: Lipase: 21 U/L (ref 11–59)

## 2011-03-11 LAB — LACTIC ACID, PLASMA: Lactic Acid, Venous: 1.9 mmol/L (ref 0.5–2.2)

## 2011-03-11 LAB — URINALYSIS, ROUTINE W REFLEX MICROSCOPIC
Bilirubin Urine: NEGATIVE
Bilirubin Urine: NEGATIVE
Glucose, UA: NEGATIVE mg/dL
Ketones, ur: 40 mg/dL — AB
Nitrite: POSITIVE — AB
Protein, ur: 100 mg/dL — AB
Specific Gravity, Urine: 1.013 (ref 1.005–1.030)
Specific Gravity, Urine: 1.027 (ref 1.005–1.030)
Urobilinogen, UA: 0.2 mg/dL (ref 0.0–1.0)
Urobilinogen, UA: 0.2 mg/dL (ref 0.0–1.0)
pH: 5.5 (ref 5.0–8.0)

## 2011-03-11 LAB — URINE MICROSCOPIC-ADD ON

## 2011-03-11 LAB — URINE CULTURE
Colony Count: >=100000
Culture  Setup Time: 201201040127
Special Requests: NEGATIVE

## 2011-03-11 LAB — DIFFERENTIAL
Basophils Absolute: 0 10*3/uL (ref 0.0–0.1)
Basophils Relative: 0 % (ref 0–1)
Eosinophils Absolute: 0 10*3/uL (ref 0.0–0.7)
Eosinophils Relative: 0 % (ref 0–5)
Lymphocytes Relative: 8 % — ABNORMAL LOW (ref 12–46)
Lymphs Abs: 0.8 K/uL (ref 0.7–4.0)
Monocytes Absolute: 0.6 10*3/uL (ref 0.1–1.0)
Monocytes Relative: 6 % (ref 3–12)
Neutro Abs: 9.2 K/uL — ABNORMAL HIGH (ref 1.7–7.7)
Neutrophils Relative %: 86 % — ABNORMAL HIGH (ref 43–77)

## 2011-03-11 LAB — COMPREHENSIVE METABOLIC PANEL WITH GFR
AST: 23 U/L (ref 0–37)
BUN: 18 mg/dL (ref 6–23)
CO2: 21 meq/L (ref 19–32)
Calcium: 9.1 mg/dL (ref 8.4–10.5)
Creatinine, Ser: 1.45 mg/dL (ref 0.4–1.5)
GFR calc Af Amer: 57 mL/min — ABNORMAL LOW (ref >60)
GFR calc non Af Amer: 47 mL/min — ABNORMAL LOW (ref >60)
Glucose, Bld: 106 mg/dL — ABNORMAL HIGH (ref 70–99)

## 2011-03-13 ENCOUNTER — Ambulatory Visit (HOSPITAL_BASED_OUTPATIENT_CLINIC_OR_DEPARTMENT_OTHER): Payer: Medicare Other | Admitting: Hematology & Oncology

## 2011-03-13 ENCOUNTER — Other Ambulatory Visit: Payer: Self-pay | Admitting: Hematology & Oncology

## 2011-03-13 DIAGNOSIS — D472 Monoclonal gammopathy: Secondary | ICD-10-CM

## 2011-03-13 DIAGNOSIS — N189 Chronic kidney disease, unspecified: Secondary | ICD-10-CM

## 2011-03-13 DIAGNOSIS — D649 Anemia, unspecified: Secondary | ICD-10-CM

## 2011-03-13 LAB — CBC WITH DIFFERENTIAL (CANCER CENTER ONLY)
BASO#: 0 10*3/uL (ref 0.0–0.2)
EOS%: 2.5 % (ref 0.0–7.0)
Eosinophils Absolute: 0.1 10*3/uL (ref 0.0–0.5)
HGB: 11.1 g/dL — ABNORMAL LOW (ref 13.0–17.1)
LYMPH#: 1.4 10*3/uL (ref 0.9–3.3)
MCHC: 31.6 g/dL — ABNORMAL LOW (ref 32.0–35.9)
NEUT#: 3 10*3/uL (ref 1.5–6.5)
Platelets: 216 10*3/uL (ref 145–400)
RBC: 3.84 10*6/uL — ABNORMAL LOW (ref 4.20–5.70)

## 2011-03-13 LAB — CBC
HCT: 38.4 % — ABNORMAL LOW (ref 39.0–52.0)
Hemoglobin: 11.9 g/dL — ABNORMAL LOW (ref 13.0–17.0)
MCH: 29.5 pg (ref 26.0–34.0)
MCHC: 32.9 g/dL (ref 30.0–36.0)
MCV: 91.4 fL (ref 78.0–100.0)
Platelets: 151 10*3/uL (ref 150–400)
RBC: 4.2 MIL/uL — ABNORMAL LOW (ref 4.22–5.81)
RDW: 12.7 % (ref 11.5–15.5)
WBC: 5.5 10*3/uL (ref 4.0–10.5)

## 2011-03-13 LAB — BASIC METABOLIC PANEL
Calcium: 9.1 mg/dL (ref 8.4–10.5)
Calcium: 9.2 mg/dL (ref 8.4–10.5)
Chloride: 107 mEq/L (ref 96–112)
Chloride: 108 mEq/L (ref 96–112)
Creatinine, Ser: 1.45 mg/dL (ref 0.4–1.5)
GFR calc Af Amer: 57 mL/min — ABNORMAL LOW (ref >60)
GFR calc Af Amer: 57 mL/min — ABNORMAL LOW (ref >60)
GFR calc Af Amer: >60 mL/min (ref >60)
GFR calc non Af Amer: 47 mL/min — ABNORMAL LOW (ref >60)
GFR calc non Af Amer: 47 mL/min — ABNORMAL LOW (ref >60)
Glucose, Bld: 100 mg/dL — ABNORMAL HIGH (ref 70–99)
Potassium: 4.3 mEq/L (ref 3.5–5.1)
Sodium: 139 mEq/L (ref 135–145)
Sodium: 139 mEq/L (ref 135–145)

## 2011-03-13 LAB — DIFFERENTIAL
Eosinophils Relative: 3 % (ref 0–5)
Lymphocytes Relative: 18 % (ref 12–46)
Lymphs Abs: 1 10*3/uL (ref 0.7–4.0)
Monocytes Absolute: 0.4 10*3/uL (ref 0.1–1.0)

## 2011-03-13 LAB — URINALYSIS, ROUTINE W REFLEX MICROSCOPIC
Glucose, UA: NEGATIVE mg/dL
Specific Gravity, Urine: 1.015 (ref 1.005–1.030)
pH: 7.5 (ref 5.0–8.0)

## 2011-03-13 LAB — GLUCOSE, CAPILLARY: Glucose-Capillary: 103 mg/dL — ABNORMAL HIGH (ref 70–99)

## 2011-03-13 LAB — POCT I-STAT, CHEM 8
BUN: 19 mg/dL (ref 6–23)
Calcium, Ion: 1.12 mmol/L (ref 1.12–1.32)
Creatinine, Ser: 1.3 mg/dL (ref 0.4–1.5)
TCO2: 25 mmol/L (ref 0–100)

## 2011-03-13 LAB — CARDIAC PANEL(CRET KIN+CKTOT+MB+TROPI)
CK, MB: 1.3 ng/mL (ref 0.3–4.0)
CK, MB: 1.5 ng/mL (ref 0.3–4.0)
Total CK: 66 U/L (ref 7–232)

## 2011-03-13 LAB — CK TOTAL AND CKMB (NOT AT ARMC)
CK, MB: 1.6 ng/mL (ref 0.3–4.0)
Relative Index: INVALID (ref 0.0–2.5)

## 2011-03-13 LAB — CHCC SATELLITE - SMEAR

## 2011-03-13 LAB — TROPONIN I: Troponin I: 0.02 ng/mL (ref 0.00–0.06)

## 2011-03-15 LAB — SPEP & IFE WITH QIG
Albumin ELP: 49.5 % — ABNORMAL LOW (ref 55.8–66.1)
IgA: 191 mg/dL (ref 68–378)
IgG (Immunoglobin G), Serum: 1200 mg/dL (ref 694–1618)
IgM, Serum: 324 mg/dL — ABNORMAL HIGH (ref 60–263)
M-Spike, %: 0.4 g/dL
Total Protein, Serum Electrophoresis: 7.3 g/dL (ref 6.0–8.3)

## 2011-03-15 LAB — COMPREHENSIVE METABOLIC PANEL
ALT: 57 U/L — ABNORMAL HIGH (ref 0–53)
AST: 44 U/L — ABNORMAL HIGH (ref 0–37)
Albumin: 3.8 g/dL (ref 3.5–5.2)
Alkaline Phosphatase: 93 U/L (ref 39–117)
Potassium: 3.9 mEq/L (ref 3.5–5.3)
Sodium: 143 mEq/L (ref 135–145)
Total Protein: 7.3 g/dL (ref 6.0–8.3)

## 2011-04-07 LAB — COMPREHENSIVE METABOLIC PANEL
ALT: 27 U/L (ref 0–53)
ALT: 37 U/L (ref 0–53)
AST: 34 U/L (ref 0–37)
AST: 36 U/L (ref 0–37)
Albumin: 3.3 g/dL — ABNORMAL LOW (ref 3.5–5.2)
Alkaline Phosphatase: 57 U/L (ref 39–117)
BUN: 27 mg/dL — ABNORMAL HIGH (ref 6–23)
CO2: 26 mEq/L (ref 19–32)
Calcium: 8.5 mg/dL (ref 8.4–10.5)
Chloride: 103 mEq/L (ref 96–112)
Creatinine, Ser: 1.6 mg/dL — ABNORMAL HIGH (ref 0.4–1.5)
GFR calc Af Amer: 31 mL/min — ABNORMAL LOW (ref >60)
GFR calc Af Amer: 51 mL/min — ABNORMAL LOW (ref >60)
GFR calc non Af Amer: 42 mL/min — ABNORMAL LOW (ref >60)
Potassium: 4.5 mEq/L (ref 3.5–5.1)
Sodium: 135 mEq/L (ref 135–145)
Sodium: 138 mEq/L (ref 135–145)
Total Bilirubin: 1.4 mg/dL — ABNORMAL HIGH (ref 0.3–1.2)
Total Protein: 5.4 g/dL — ABNORMAL LOW (ref 6.0–8.3)
Total Protein: 6.8 g/dL (ref 6.0–8.3)

## 2011-04-07 LAB — URINALYSIS, ROUTINE W REFLEX MICROSCOPIC
Glucose, UA: NEGATIVE mg/dL
Ketones, ur: NEGATIVE mg/dL
Nitrite: NEGATIVE
Protein, ur: 30 mg/dL — AB

## 2011-04-07 LAB — CBC
HCT: 36.2 % — ABNORMAL LOW (ref 39.0–52.0)
MCHC: 34 g/dL (ref 30.0–36.0)
MCV: 95.8 fL (ref 78.0–100.0)
Platelets: 164 10*3/uL (ref 150–400)
RBC: 3.07 MIL/uL — ABNORMAL LOW (ref 4.22–5.81)
RDW: 14.8 % (ref 11.5–15.5)
RDW: 15 % (ref 11.5–15.5)
WBC: 11.5 10*3/uL — ABNORMAL HIGH (ref 4.0–10.5)

## 2011-04-07 LAB — URINE CULTURE
Colony Count: NO GROWTH
Colony Count: NO GROWTH
Culture: NO GROWTH
Culture: NO GROWTH

## 2011-04-07 LAB — DIFFERENTIAL
Basophils Absolute: 0 10*3/uL (ref 0.0–0.1)
Basophils Relative: 0 % (ref 0–1)
Eosinophils Absolute: 0 10*3/uL (ref 0.0–0.7)
Eosinophils Relative: 0 % (ref 0–5)
Monocytes Absolute: 0.9 10*3/uL (ref 0.1–1.0)
Monocytes Relative: 8 % (ref 3–12)

## 2011-04-07 LAB — URINE MICROSCOPIC-ADD ON

## 2011-04-07 LAB — FERRITIN: Ferritin: 334 ng/mL — ABNORMAL HIGH (ref 22–322)

## 2011-04-07 LAB — BASIC METABOLIC PANEL
BUN: 18 mg/dL (ref 6–23)
CO2: 26 mEq/L (ref 19–32)
Calcium: 8.6 mg/dL (ref 8.4–10.5)
Creatinine, Ser: 1.35 mg/dL (ref 0.4–1.5)
GFR calc non Af Amer: 51 mL/min — ABNORMAL LOW (ref >60)
Glucose, Bld: 103 mg/dL — ABNORMAL HIGH (ref 70–99)

## 2011-04-07 LAB — HEMOCCULT GUIAC POC 1CARD (OFFICE): Fecal Occult Bld: NEGATIVE

## 2011-04-07 LAB — FOLATE: Folate: 12.1 ng/mL

## 2011-04-07 LAB — RETICULOCYTES
RBC.: 3.2 MIL/uL — ABNORMAL LOW (ref 4.22–5.81)
Retic Ct Pct: 1 % (ref 0.4–3.1)

## 2011-04-07 LAB — HEMOGLOBIN A1C: Mean Plasma Glucose: 123 mg/dL

## 2011-04-08 ENCOUNTER — Ambulatory Visit (HOSPITAL_COMMUNITY): Payer: Medicare Other

## 2011-04-08 ENCOUNTER — Ambulatory Visit (HOSPITAL_COMMUNITY)
Admission: RE | Admit: 2011-04-08 | Discharge: 2011-04-08 | Disposition: A | Discharge status: Home / Self Care | Payer: Medicare Other | Source: Ambulatory Visit | Attending: Urology | Admitting: Urology

## 2011-04-08 DIAGNOSIS — Z951 Presence of aortocoronary bypass graft: Secondary | ICD-10-CM | POA: Insufficient documentation

## 2011-04-08 DIAGNOSIS — N200 Calculus of kidney: Secondary | ICD-10-CM | POA: Insufficient documentation

## 2011-04-08 DIAGNOSIS — Z01812 Encounter for preprocedural laboratory examination: Secondary | ICD-10-CM | POA: Insufficient documentation

## 2011-04-08 DIAGNOSIS — J438 Other emphysema: Secondary | ICD-10-CM | POA: Insufficient documentation

## 2011-04-08 LAB — COMPREHENSIVE METABOLIC PANEL
Albumin: 3.7 g/dL (ref 3.5–5.2)
BUN: 17 mg/dL (ref 6–23)
Calcium: 9.6 mg/dL (ref 8.4–10.5)
Chloride: 105 mEq/L (ref 96–112)
Creatinine, Ser: 1.45 mg/dL (ref 0.4–1.5)
Total Bilirubin: 0.8 mg/dL (ref 0.3–1.2)
Total Protein: 6.9 g/dL (ref 6.0–8.3)

## 2011-04-08 LAB — CBC
MCH: 29.6 pg (ref 26.0–34.0)
MCHC: 32.3 g/dL (ref 30.0–36.0)
MCV: 91.6 fL (ref 78.0–100.0)
Platelets: 189 10*3/uL (ref 150–400)
RDW: 14 % (ref 11.5–15.5)

## 2011-04-09 LAB — DIFFERENTIAL
Basophils Absolute: 0 10*3/uL (ref 0.0–0.1)
Eosinophils Relative: 1 % (ref 0–5)
Lymphocytes Relative: 13 % (ref 12–46)
Lymphs Abs: 0.7 10*3/uL (ref 0.7–4.0)
Monocytes Absolute: 0.4 10*3/uL (ref 0.1–1.0)

## 2011-04-09 LAB — POCT CARDIAC MARKERS: Myoglobin, poc: 104 ng/mL (ref 12–200)

## 2011-04-09 LAB — URINE MICROSCOPIC-ADD ON

## 2011-04-09 LAB — CBC
HCT: 33.3 % — ABNORMAL LOW (ref 39.0–52.0)
Hemoglobin: 11.6 g/dL — ABNORMAL LOW (ref 13.0–17.0)
RDW: 14 % (ref 11.5–15.5)

## 2011-04-09 LAB — URINALYSIS, ROUTINE W REFLEX MICROSCOPIC
Bilirubin Urine: NEGATIVE
Glucose, UA: NEGATIVE mg/dL
Ketones, ur: NEGATIVE mg/dL
pH: 6.5 (ref 5.0–8.0)

## 2011-04-09 LAB — BASIC METABOLIC PANEL
Chloride: 109 mEq/L (ref 96–112)
GFR calc non Af Amer: 35 mL/min — ABNORMAL LOW (ref >60)
Glucose, Bld: 124 mg/dL — ABNORMAL HIGH (ref 70–99)
Potassium: 4 mEq/L (ref 3.5–5.1)
Sodium: 136 mEq/L (ref 135–145)

## 2011-05-14 NOTE — H&P (Signed)
[name] NO.:  [id number]   MEDICAL RECORD NO.:  [id number]          PATIENT TYPE:  INP   LOCATION:  2922                         FACILITY:  [facility]   PHYSICIAN:  Vania Rea, M.D. DATE OF BIRTH:  [birth year]   DATE OF ADMISSION:  07/16/2009  DATE OF DISCHARGE:                              HISTORY & PHYSICAL   PRIMARY CARE PHYSICIAN:  Dr. Lewie Chamber, in [place], [place].   CARDIOLOGIST:  Dr. Nicki Guadalajara.   UROLOGIST:  [facility].   CHIEF COMPLAINT:  Colicky right flank pain for 5 days.   HISTORY OF PRESENT ILLNESS:  This is a 77 year old Caucasian gentleman  with a history of hypertension, coronary artery disease status post  CABG, emphysema and kidney stones with a history of recurrent ureteric  colic who presents with worsening right-sided flank pain for the past 5  days.  The patient says the pain has been waxing and waning and since he  has passed multiple kidney stones before thought it would resolve  eventually when he passed the stone but eventually came to the emergency  room because it was not resolving.  At its maximum the pain was an 8/10,  when initially seen in the emergency room pain was a 12/10.  Patient had  a CAT scan of the abdomen which demonstrated bilateral renal stones and  right-sided hydronephrosis but no stone noted in the urethra.  The  patient had blood work which also demonstrated worsening serum  creatinine, compared to a previous visit, and analysis of his urine  suggested urinary tract infection and the hospitalist service was called  to assist in management.   On Wednesday, that was around 4 days ago, patient had surgery on his  right ear, he said it was to replace his ear, questionable cochlea  transplant, and patient was started on Cipro after that surgery.  Other  than this, patient denies any acute problems.  He denies any fever,  cough or cold or chest pains.  He has been having some nausea and  vomiting.   PAST MEDICAL HISTORY:  1. Coronary artery disease status post CABG.  2. Hypertension.  3. Cataract status post bilateral lens implants.  4. Emphysema.  5. Recurrent renal stones.  6. History of ear transplant 4 days ago.   MEDICATIONS:  1. Lopressor 25 mg twice daily.  2. Altace 5 mg daily.  3. Aspirin 81 mg daily.  4. Crestor 20 mg daily.  5. Niaspan 500 mg daily.  6. Cipro 500 mg twice daily, started 4 days ago.  7. Nitrostat 0.4 mg sublingual every 5 minutes when necessary.  8. Darvocet-N 100 when necessary.  9. Spiriva 18 mcg each morning.  10.Symbicort 160 two puffs twice daily.  11.ProAir 180 two puffs when necessary.   ALLERGIES:  Listed as ARICEPT and DIFLUCAN, but patient only says he is  allergic to whatever they give him at the [place].   SOCIAL HISTORY:  Discontinued smoking 15 years ago, denies alcohol or  illicit drug use.  Formerly worked as a [employer] architect.  Lives with his  wife.   REVIEW OF  SYSTEMS:  On a 10-point review of systems, other than noted  above, significant only for bruises on his forearms which he says have  been there for about a week, may be due to his blood thinners, although  he is not sure what blood thinners he is taking.  He denies taking  Coumadin.  He does not think he is taking Plavix.   PHYSICAL EXAMINATION:  A very pleasant but very hard of hearing elderly  Caucasian gentleman lying in bed.  He seems quite comfortable.  VITALS:  His temperature is 97.9, his pulse is 63, respirations 14,  blood pressure 113/56, he is saturating 98% on room air.  His pupils are  round and equal.  Mucous membranes pink, anicteric.  No cervical  lymphadenopathy.  He has cotton stuffed in his right ear and he has a  hearing aid in his left ear.  No carotid bruit or jugular venous  distention.  CHEST:  Clear to auscultation bilaterally.  CARDIOVASCULAR:  Regular rhythm, no murmur heard.  ABDOMEN:  Soft, no tenderness is manifested.  EXTREMITIES:   Without edema.  He has 2+ dorsalis pedis pulses  bilaterally and his toes are warm.  He has ecchymosis on both forearms  but no ulcerations.  CENTRAL NERVOUS SYSTEM:  Cranial nerves II-XII with exception of his  hearing is grossly intact and he has no focal lateralizing signs.   LABS:  His white count is 11.5, hemoglobin 12.3, hematocrit 95.5,  platelets 164.  His absolute granulocyte count is 9.6, otherwise normal.  His sodium is 135, potassium 4.4, chloride 103, CO2 25, glucose 106, BUN  27, creatinine 2.44, calcium 9.2, albumin 3.3, total bilirubin 1.4.  Liver functions otherwise unremarkable.  Urinalysis - specific gravity  is 1.015, pH 6.5, a large amount of hemoglobin, 30 protein, nitrite  negative, a large amount of leukocyte esterase.  Microscopy shows a few  epithelial cells, 11-20 white cells, 11-20 red cells, a few bacteria.  Stool occult blood is negative, his lipase is 15.  CT scan of the  abdomen and pelvis without contrast demonstrates bilateral renal  calculi, the largest stone is in the left inferior pole measuring 7.6-  mm, there is no evidence of left-sided hydronephrosis.  There is right-  sided hydroureter without evidence of ureteral calculus.  No free fluid  or abdominal fluid collections.   ASSESSMENT:  1. Renal colic, resolved.  Possible status post passage of a stone,      questionable other.  2. Right-sided hydronephrosis.  3. Renal failure, acute and chronic versus acute.  4. Urinary tract infection, on Cipro.  5. Coronary artery disease.  6. Hypertension, controlled.   PLAN:  1. Will admit this gentleman for hydration for his renal failure and      will add ertapenem to his Cipro to treat his      urinary tract infection, will consult urologist for assistance with      management.  Will continue his home medications, particularly      Cipro, in view of his recent ear surgery.  2. Other plans as per orders.      Vania Rea, M.D.   Electronically Signed     LC/MEDQ  D:  07/16/2009  T:  07/16/2009  Job:  [id number]   cc:   Dr. Zenovia Jordan, M.D.  Courtney Paris, M.D.

## 2011-05-14 NOTE — Discharge Summary (Signed)
NAMEARSLAN, [name]            ACCOUNT NO.:  [id number]   MEDICAL RECORD NO.:  [id number]          PATIENT TYPE:  INP   LOCATION:  5029                         FACILITY:  [facility]   PHYSICIAN:  Theodosia Paling, MD    DATE OF BIRTH:  [birth year]   DATE OF ADMISSION:  07/15/2009  DATE OF DISCHARGE:  07/18/2009                               DISCHARGE SUMMARY   PRIMARY CARE PHYSICIAN:  Dr. Nathanial Rancher in [place], [place].   UROLOGIST:  [facility].   ADMITTING HISTORY:  Please refer to the excellent admission note  dictated by Dr. Vania Rea under admitting history.   DISCHARGE DIAGNOSES:  1. Renal colicky pain with persistent left ureterovesical stone 3 mm      in size.  2. Right-sided hydronephrosis without stone.  3. Bilateral nephrolithiasis.   SECONDARY DIAGNOSES:  1. History of coronary artery disease.  2. History of dementia.  3. History of hypertension.  4. History of recurrent stone.   DISCHARGE MEDICATIONS:  New medications added:  1. Percocet 5/325 mg p.o. q.6 h. p.r.n.  2. Ciprofloxacin 500 mg p.o. q.12 h. for 5 days.  3. Flomax 0.4 mg p.o. daily.   Continue home medications, following:  1. Lopressor 25 mg p.o. q.12 h.  2. Aspirin 81 mg p.o. daily.  3. Crestor 20 mg p.o. daily.  4. Niaspan 500 mg p.o. daily.  5. Nitrostat 0.4 mg sublingual every 5 minutes p.r.n.  6. Spiriva 18 mcg each morning.  7. Symbicort 160 mcg 2 puffs twice a day.  8. ProAir 180 mcg 2 puffs when necessary q.6 h. p.r.n.   HOSPITAL COURSE:  Following issues are addressed during the  hospitalization:  1. Renal colicky pain.  The patient underwent a CT scan, which showed      right-sided hydronephrosis without stone and also bilateral      nephrolithiasis.  Along with that, there was a persistent left-      sided ureterovesical junction stone measuring 2.6 mm without any      associated hydronephrosis, most likely nonobstructive.  The      patient's urology consult was  performed by Dr. Aldean Ast.  They      recommended the patient to go home and outpatient followup with      Flomax and pain medication on board.  The patient will be following      up with Dr. Valda Lamb office in 2 weeks' time.  The patient      remained hemodynamically stable, and his urine culture was      negative, so he did not have urinary tract infection.  2. Urinary retention.  The patient was found to have high-residual      urine retention; therefore, Foley catheter is left in place along      with Flomax.  3. Acute on chronic kidney disease.  At the time of admission, the      patient had elevated creatinine, which has resolved to baseline at      the time of discharge.   PROCEDURE PERFORMED:  None.   CONSULTATION PERFORMED:  As mentioned under hospital course.  IMAGING PERFORMED:  As mentioned under hospital course.   DISPOSITION:  1. The patient will follow up with the primary care physician Dr.      Nathanial Rancher in 1 week's time.  2. The patient will follow up with [facility] in 2 weeks' time.   Total time spent in discharge of this patient around 45 minutes.      Theodosia Paling, MD  Electronically Signed     NP/MEDQ  D:  07/18/2009  T:  07/19/2009  Job:  [id number]

## 2011-05-14 NOTE — Consult Note (Signed)
NAMEHADES, MATHEW            ACCOUNT NO.:  [id number]   MEDICAL RECORD NO.:  [id number]          PATIENT TYPE:  INP   LOCATION:  2922                         FACILITY:  [facility]   PHYSICIAN:  Courtney Paris, M.D.DATE OF BIRTH:  19-Aug-1929   DATE OF CONSULTATION:  07/16/2009  DATE OF DISCHARGE:                                 CONSULTATION   REASON FOR CONSULTATION:  Right hydronephrosis with flank pain but no  stone seen, left distal ureteral stone, and mild renal insufficiency.   BRIEF HISTORY:  This 77 year old patient previously saw Dr. Wanda Plump,  that has been many years.  He was admitted for right flank pain and  difficulty voiding.  He had some kind of operation on his ear few days  ago and apparently got constipated.  He went to the emergency room with  right-sided flank pain and difficulty voiding.  A catheter was inserted.  A CT scan showed right hydronephrosis down to the level of the bladder  without stone, although he did have bilateral renal calculi.  The  largest stone on the right was 6 mm and on the left was 3-4 mm.  He also  had a left distal ureteral stone 3.6 mm without obstruction in the same  place and size that it was on May 06, 2009 when he presented to the  emergency room with some left-sided pain.  He says he later passed a  stone but he is little confused and is not sure whether this was the  case.  His creatinine in May was 1.9, last night was 2.4.   Other medical problems include COPD, emphysema, questionable sleep  apnea, congestive heart failure, CABG in December 2006, childhood  tuberculosis, left knee surgery, and amputation of index finger, left  hand   He was just on ProAir on admission.  He has been having some difficulty  voiding but off and on.  He has not seen Dr. Wanda Plump since he did a  holmium lasertripsy of a left distal ureteral stone on June 06, 2006.  He  did pass stones when he lived up in [place] years before.   FAMILY  HISTORY AND SOCIAL HISTORY:  Lives with his wife.  He is little  confused and retired.  He does not smoke or drink.   REVIEW OF SYSTEMS:  Twelve point review of systems is negative except  for above.   PHYSICAL EXAMINATION:  VITAL SIGNS:  Stable.  GENERAL:  He is a pleasant somewhat hard-of-hearing white male in no  acute distress.  HEENT:  Clear.  NECK:  Supple.  ABDOMEN:  Soft.  He does have a midline sternotomy scar, well-healed.  He also has a lower abdominal midline scar that he is not sure why he  had.  His abdomen is little mildly distended.  He does have some mild  right CVA tenderness to fist percussion.  Foley catheter is in place  draining pink urine.  GENITOURINARY:  Penis is circumcised.  Bilaterally descended testes.  Prostate is moderately enlarged, not fixed or indurated.  EXTREMITIES:  Faint distal pulses.  Bruises noted on upper extremities.  IMPRESSION:  1. Right-sided flank pain.  2. Right hydronephrosis without stone.  3. Left ureterovesical junction, same location and size as May 2010.  4. Bilateral renal stones.  5. Possible urinary tract infection.  6. Urinary retention with Foley.  7. Coronary artery bypass graft, chronic obstructive pulmonary      disease, and congestive heart failure.  8. Hematuria.  9. Mild renal insufficiency.   RECOMMENDATIONS:  Stabilize medically.  We will try to get more  information when his wife is here.  We will follow renal function tests,  ambulate, and then a voiding trial on Flomax.  He will ultimately need a  cysto, possible left ureteroscopy and a right retrograde pyelogram to  determine the etiology of his hydronephrosis, but this can be done as an  outpatient preferably.      Courtney Paris, M.D.  Electronically Signed     HMK/MEDQ  D:  07/16/2009  T:  07/16/2009  Job:  [id number]

## 2011-05-17 NOTE — Op Note (Signed)
NAMEJAIRO, [name]                        ACCOUNT NO.:  [id number]   MEDICAL RECORD NO.:  [id number]                   PATIENT TYPE:  OBV   LOCATION:  5707                                 FACILITY:  [facility]   PHYSICIAN:  Lowell Bouton, M.D.      DATE OF BIRTH:  09-25-1929   DATE OF PROCEDURE:  DATE OF DISCHARGE:  09/20/2002                                 OPERATIVE REPORT   PREOPERATIVE DIAGNOSES:  Table-saw injury left hand, with complete  amputation left index finger, and multiple lacerations left palm and left  ring and small fingers, along with open fracture of middle and proximal  phalanx of the proximal interphalangeal joint of the small finger, and ulnar  digital nerve laceration left small finger, with flexor tendon injury and  digital nerve lacerations left ring finger.   POSTOPERATIVE DIAGNOSES:  Table-saw injury left hand, with complete  amputation left index finger, and multiple lacerations left palm and left  ring and small fingers, along with open fracture of middle and proximal  phalanx of the proximal interphalangeal joint of the small finger, and ulnar  digital nerve laceration left small finger, with flexor tendon injury and  digital nerve lacerations left ring finger.   PROCEDURE:  Revision of amputation left index finger, repair of laceration  left middle finger, repair of flexor digitorum profundus tendon, and  segmental digital nerve lacerations both radial and ulnarly, left ring  finger, with open reduction, internal fixation of proximal interphalangeal  and proximal phalanx left small finger with repair of ulnar digital nerve  left small finger.   SURGEON:  Lowell Bouton, M.D.   ANESTHESIA:  General.   OPERATIVE FINDINGS:  The patient had a jagged laceration involving mainly  the ring and small fingers.  The index finger was amputated through the  proximal phalanx and the amputated part was brought with the patient.  It  was  not felt to be in the best interests of the patient's recovery to  perform replantation of the index finger.  The middle finger had a  laceration that extended across the proximal digital crease and extended  across the palmar aspect of the ring and small fingers.  The ring finger had  a 95% transection of the profundus tendon beneath A2.  The A2 pulley was  also damaged.  The saw had taken portion of the skin and both digital nerves  away and translated it dorsally.  There was a segmental laceration of both  digital nerves and there was a complete laceration of the ulnar digital  artery.  The small finger had a comminuted intra-articular fracture of the  proximal interphalangeal joint mainly involving the proximal phalanx.  There  was half of the articular surface missing. The flexor tendons were intact  but the extensor tendon was completely chewed up dorsally. The ulnar digital  nerve and artery ulnarly were transected.   DESCRIPTION OF PROCEDURE:  Under general anesthesia with the tourniquet on  the left arm, the left hand was prepped and draped in the usual fashion and  the wounds were copiously irrigated with saline.  The small finger was  rotated 90 degrees and after de-rotating it, there was good circulation to  it.  There was good circulation of the ring finger and the middle finger.  The hand was elevated and the tourniquet was inflated to 250 mmHg.  The  small finger was worked on first and had horrendous jagged lacerations both  dorsally and volarly of the skin.  The 45 K-wire was passed retrograde  across the middle phalanx out across the DIP joint with the joint held in  full extension.  A second K-wire was placed parallel to the first.  The  fracture of the proximal phalanx was then reduced and held and the remaining  joint surface was aligned with the middle phalanx joint surface.  There was  half of the articular surface at the condyle of the proximal phalanx  missing.   The K-wires were then placed across the PIP joint and the fracture  site holding the digit in full extension.  X-ray showed good alignment.  The  flexor tendons appeared to be intact and extensor tendon was jaggedly cut in  multiple areas.  This was repaired with a 4-0 Mersilene suture.  The  attention was then focused on the ring finger and the flexor tendons were  examined.  The distal portion of the A2 pulley was avulsed with a piece of  bone off of the ulnar aspect radially.  The FDS tendon had one slip intact  and the profundus tendon was 95% transected. The profundus tendon was  repaired with a 3-0 Ethibond suture in a Kessler fashion and then 6-0  Prolene at the Tenon suture was used to tidy up the repair.  The ulnar slip  of the superficialis was not repaired.  The A2 pulley was repaired with 6-0  Prolene suture allowing good gliding of the tendon repair.  At this point  the microscope was brought in and the digital nerve of the ring finger were  examined.  There were segmental defects and the nerves were identified in  the palmar flap.  The nerve was dissected out and the segmental defects were  repaired, basically by performing a graft of the segment that was in the  volar flap.  Nylon 9-0 suture was used in an interrupted fashion performing  epineural repair of both the radial and ulnar digital nerves in a segmental  fashion with two repair sites on each nerve.  The ulnar digital artery on  the ring finger was also segmentally injured and was coagulated with  electrocautery.  The radial digital artery remained intact.  The microscope  was then brought to the small finger and the ulnar digital nerve was  identified and was dissected out.  The ends of it were trimmed back to good  nerve and a 9-0 nylon was used for an epineural repair.  The index finger  was then revised by using a freer elevator to remove any soft tissue off of the bone of the proximal phalanx.  A saw was then used  to make a transverse  cut through the proximal portion of the proximal phalanx allowing primary  closure.  The flexor tendons were pulled out in the wound and transected.  The tourniquet was then released and there was good circulation to the  digits.  The index finger was closed with a 4-0 nylon  suture after trimming  back and adjusting the flaps.  The laceration on the middle finger that  extended across the proximal digital crease and across to the ring finger  and around the dorsum of the finger measured about 8 cm in length.  This  laceration was closed with 4-0 nylon sutures.  The jagged lacerations on the  dorsum of the ring and small fingers were likewise closed with 4-0 nylon  sutures.  The palmar lacerations on the small finger were closed with 4-0  nylon.  There was one area where the volar flap did not have good  circulation but it appeared that it did gradually did develop some  circulation.  Sterile dressings were applied.  The pins were bent over and  left protruding from the skin on the small finger.  A dorsal protective  splint was applied with the wrist in slight flexion and the MP flexed. The  patient tolerated the procedure well and went to the recovery room awake in  stable and good condition.                                                Lowell Bouton, M.D.    EMM/MEDQ  D:  09/19/2002  T:  09/20/2002  Job:  [id number]   cc:   Holley Bouche, M.D.  [address]  [place], [place] [address]  Fax: [phone]

## 2011-05-17 NOTE — Discharge Summary (Signed)
NAMEBUDDY, LOEFFELHOLZ                        ACCOUNT NO.:  [id number]   MEDICAL RECORD NO.:  [id number]                   PATIENT TYPE:  INP   LOCATION:  5153                                 FACILITY:  [facility]   PHYSICIAN:  Thora Lance, M.D.               DATE OF BIRTH:  06/20/1929   DATE OF ADMISSION:  08/24/2002  DATE OF DISCHARGE:  08/31/2002                                 DISCHARGE SUMMARY   REASON FOR ADMISSION:  A 77 year old white male who was admitted with the  onset of abdominal pain one day prior to admission.  The pain was epigastric  and associated with nausea and had gotten progressively worse during the 24  hours prior to admission.  He denied any significant alcohol use.  He had  been started on Voltaren for knee pain two weeks prior at the [facility].   SIGNIFICANT FINDINGS:  Blood pressure was 192/98, heart rate 83,  respirations 16, temperature 97.8.  The abdomen was soft.  There was mild  epigastric tenderness.  Normal bowel sounds.  No mass.   LABORATORIES ON ADMISSION:  CBC:  WBC 8.1, hemoglobin 12.7, platelet count  175.  There was a left shift.  PT 13.4, PTT 33.  Sodium 133, potassium 3.9,  chloride 102, bicarbonate 24, glucose 131, BUN 22, creatinine 1.0.  Calcium  8.5, total protein 6.3, albumin 3.3, AST 26, ALT 33, alkaline phosphatase,  total bilirubin 1.1.  Amylase 1544, lipase 1066.  CK 7, CK-MB 2.3, troponin  I 0.04.  Urinalysis unremarkable.   HOSPITAL COURSE:  Acute pancreatitis.  The patient was admitted with acute  pancreatitis.  The etiology was unclear.  He did not drink alcohol.  A right  upper quadrant ultrasound was done and was unremarkable without evidence of  any gallstones, normal gallbladder, no ductal dilatation, and no obvious  pancreatic abnormality.  Triglycerides were normal at 146.  Voltaren had  been started two weeks prior, can very infrequently cause pancreatitis, was  considered a positive etiology.  Otherwise idiopathic.   The patient was  treated with IV fluids, made n.p.o. and treated with IV Dilaudid for pain  control.  His Ranson criteria all remained negative.  His lipase had  normalized by August 28.  By August 29, despite improvements in all his laboratory parameters, he  continued to have considerable abdominal pain.  He also had a low-grade  fever throughout the first three hospital days.  Blood cultures were drawn  on August 26 and remained negative.  A CT scan of the abdomen was done on  August 29 and showed evidence of pancreatitis but no evidence of phlegmon,  pseudocyst or abscess.  The patient was placed on clear liquids on August 29  and by August 31 he was advanced to a regular diet.  He tolerated this well.  By September 1 his abdominal pain was considerably better and by September 2  it had resolved.  IV Dilaudid was stopped on August 30 and he was switched  to Percocet.  This was stopped on the day of discharge because it was  causing sweats and his pain had resolved.  His liver function tests did  begin to rise towards the end of the hospitalization with a total bilirubin  of 2.1, alkaline phosphatase of 209, SGOT of 56, and SGPT of 98.  This was  felt related possibly to edema from his pancreatitis or from the effect of  narcotics.  The LFTs should be followed up as an outpatient.   On August 31 a set of blood cultures was obtained despite the fact that the  patient was afebrile.  One out of two sets showed gram-positive cocci in  clusters, ID pending at the time of this dictation.  The other set was  negative.  This was felt to be a probable contaminant and the patient was  discharged.   DISCHARGE DIAGNOSIS:  Acute pancreatitis.   PROCEDURES:  1. Right upper quadrant ultrasound.  2. V/Q scan of the abdomen.   DISCHARGE MEDICATIONS:  Tylenol up to 1 g q.6h. p.r.n. pain.   The patient's diclofenac was discontinued and he was instructed not to use  nonsteroidal any further.  The  patient was also treated with a PPI during  this admission but this was discontinued at discharge.   DISCHARGE DIET:  Regular.   ACTIVITY:  No restrictions.   FOLLOW UP:  Two weeks with Dr. Leonides Sake.                                                  Thora Lance, M.D.    Delorse Limber  D:  08/31/2002  T:  08/31/2002  Job:  [id number]   cc:   Leonides Sake, M.D.

## 2011-05-17 NOTE — Discharge Summary (Signed)
NAMELEQUAN, DOBRATZ NO.:  [id number]   MEDICAL RECORD NO.:  [id number]          PATIENT TYPE:  INP   LOCATION:  2038                         FACILITY:  [facility]   PHYSICIAN:  Evelene Croon, M.D.     DATE OF BIRTH:  [birth year]   DATE OF ADMISSION:  12/05/2005  DATE OF DISCHARGE:  12/11/2005                                 DISCHARGE SUMMARY   HISTORY OF PRESENT ILLNESS:  The patient is a 77 year old male medical  patient of Dr. Aida Puffer who is referred to [facility] for evaluation for increasing symptoms of chest discomfort. He  underwent a Cardiolite study which was positive for 2-mm inferolateral ST  depressions and ischemia. He subsequently was referred for cardiac  catheterization, which was done at the [facility] by  Dr. Tresa Endo, and this showed high-grade left main disease and multivessel  coronary disease which prompted admission to [facility] for further  medical treatment and surgical consultation.   PAST MEDICAL HISTORY:  1.  Hospitalization for TB instituted at age 59 or 73. His father had      tuberculosis.  2.  Emphysema.  3.  Spondylosis and spinal stenosis, L5 through S1.  4.  Neuropathy.  5.  Major memory loss with negative MRI evaluation for Alzheimer changes.  6.  Questionable sleep apnea.  7.  History of nephrolithiasis.  8.  Prior left knee surgery.   MEDICATIONS PRIOR TO ADMISSION:  1.  Toprol XL 25 mg daily.  2.  Imdur 60 mg daily.  3.  Baby aspirin 81  mg daily.   For family history, social history, review of symptoms, and physical exam,  please see history and physical done at the time of admission.   HOSPITAL COURSE:  The patient was admitted and started on intravenous  heparin. A cardiovascular surgery consultation was obtained with Dr. Evelene Croon who evaluated the patient's studies and agreed with recommendations  to proceed with surgical revascularization. The procedure  was scheduled on  December 06, 2005. He was taken to the operating room where he underwent the  following procedure coronary artery bypass grafting times four with the  following grafts placed: Left internal mammary artery to the left anterior  descending; saphenous vein graft to the right coronary artery, sequential  saphenous vein graft to the intermediate and first obtuse marginal. The  patient tolerated the procedure well and was taken to the surgical intensive  care unit in stable condition.   POSTOPERATIVE HOSPITAL COURSE:  The patient has done well. He has remained  hemodynamically stable and was extubated without difficulty. The patient's  chest tubes, routine lines, and other devices were all discontinued in the  standard fashion. The patient has had an episode of supraventricular  tachycardia, but has stabilized to a normal sinus rhythm with additional  beta blockers in the postoperative period. The patient also has a  postoperative anemia, but is stable from a clinical view point and  tolerating well. The most recent hemoglobin and hematocrit dated December 08, 2005, were 9.0 and 25.9 respectively. The  patient's oxygen has been  weaned and is maintaining good saturations on room air.  The patient has  responded well to gentle diuresis. The patient's incisions are all healing  well without evidence of infection. Electrolytes, BUN and creatinine, are  all stable and within normal limits, although did have a slight initial  increase during the postoperative period to 1.8; most recent dated December 10, 2005, was 1.2. Overall, the patient is felt to be tentatively stable for  discharge in the morning of December 11, 2005, pending morning round re-  evaluation.   MEDICATIONS ON DISCHARGE:  1.  Lopressor 25 mg b.i.d.  2.  Crestor 10 mg at bedtime.  3.  Tylox one or two q.4-6h. p.r.n.  4.  The patient also may be started on a low-dose ACE inhibitor pending      evaluation of  creatinine in the morning of December 11, 2005. If it is      stable, the patient will be sent home on 1.25 mg of Altace daily.   DISCHARGE INSTRUCTIONS:  The patient will receive instructions regarding  medication, activity, diet, and wound care.   FOLLOWUP:  With Dr. Laneta Simmers on January 28, 2006, at 12:30. Additionally, the  patient is instructed to follow up with Dr. Tresa Endo in two weeks at their  office.   FINAL DIAGNOSIS:  Severe multivessel coronary artery disease as described.  Also, 90% left main disease. Note ejection fraction at the time of  catheterization, 62%.   OTHER DIAGNOSES:  1.  History of emphysema.  2.  History of spondylosis and spinal stenosis.  3.  History of neuropathy.  4.  History of memory loss.  5.  History of questionable sleep apnea.  6.  History of nephrolithiasis.  7.  History of left knee surgery.  8.  Postoperative anemia.      Rowe Clack, P.A.-C.      Evelene Croon, M.D.  Electronically Signed    WEG/MEDQ  D:  12/10/2005  T:  12/11/2005  Job:  [id number]   cc:   Nicki Guadalajara, M.D.  Fax: [phone]   Aida Puffer  Fax: [phone]

## 2011-05-17 NOTE — Op Note (Signed)
NAMEJASPREET, [name]            ACCOUNT NO.:  [id number]   MEDICAL RECORD NO.:  [id number]          PATIENT TYPE:  INP   LOCATION:  2314                         FACILITY:  [facility]   PHYSICIAN:  Evelene Croon, M.D.     DATE OF BIRTH:  03-Mar-1929   DATE OF PROCEDURE:  12/06/2005  DATE OF DISCHARGE:                                 OPERATIVE REPORT   PREOPERATIVE DIAGNOSIS:  Left main and severe three-vessel coronary artery  disease.   POSTOPERATIVE DIAGNOSIS:  Left main and severe three-vessel coronary artery  disease.   OPERATIVE PROCEDURE:  1.  Median sternotomy.  2.  Extracorporeal circulation.  3.  Coronary bypass graft surgery x4 using a left internal mammary artery      graft to the left anterior descending coronary artery, with a saphenous      vein graft to the distal right coronary, and a sequential saphenous vein      graft to the intermediate and obtuse marginal branches of the left      circumflex coronary artery.  4.  Endoscopic vein harvesting from the right leg.   ATTENDING SURGEON:  Evelene Croon, MD   ASSISTANT:  Ammie Ferrier, PA-C   ANESTHESIA:  General endotracheal.   CLINICAL HISTORY:  This patient is a 77 year old gentleman with no prior  cardiac history, who has greater than a 1-year history of progressive  fatigue.  For the past several months, he has had worsening shortness of  breath with exertion and recently started having some chest discomfort.  He  had a markedly positive stress test and underwent cardiac catheterization  which showed a 90% distal left main stenosis.  There was about 40% to 50%  proximal LAD stenosis.  There was a small diagonal that had about 80%  stenosis.  The mid LAD had about 80% stenosis.  There was a small  intermediate branch.  The left circumflex gave off 1 large marginal branch.  The mid left circumflex had about 50% stenosis.  The right coronary had  about 40% proximal stenosis and a 60% to 70% mid-vessel stenosis.   Left  ventricular ejection fraction was 62%.  After review of the angiogram and  examination of the patient, it was felt that coronary artery bypass graft  surgery was the best treatment.  I discussed the operative procedure with  the patient and his wife including alternatives, benefits, and risks  including bleeding, blood transfusion, infection, stroke, myocardial  infarction, graft failure, and death.  They understood and agreed to  proceed.   OPERATIVE PROCEDURE:  The patient was taken to the operating room and placed  on the table in a supine position.  After induction of general endotracheal  anesthesia, a Foley catheter was placed in the bladder using sterile  technique.  Then the chest, abdomen and both lower extremities were prepped  and draped in the usual sterile manner.  The chest was entered through the  median sternotomy incision and the pericardium opened in the midline.  Examination of the heart showed good ventricular contractility.  The  ascending aorta had no palpable plaques in it.  Then the left internal mammary artery was harvested from the chest wall as a  pedicle graft.  This was a medium-caliber vessel with excellent blood flow  through it.  At the same time, a segment of greater saphenous vein was  harvested from the right leg using endoscopic vein harvest technique.  This  vein was of large caliber and had somewhat thickened walls.   Then the patient was heparinized and when an adequate activated clotting  time was achieved, the distal ascending aorta was cannulated using a 20-  [place] aortic cannula for arterial inflow.  Venous outflow was achieved  using a two-stage venous cannula through the right atrial appendage.  An  antegrade cardioplegia and vent cannula was inserted in the aortic root.   The patient was placed on cardiopulmonary bypass and distal coronaries were  identified.  The LAD was a large graftable vessel.  It was heavily diseased   proximally.  The intermediate was small, but graftable.  The obtuse marginal  was a moderate-sized graftable vessel.  The distal right coronary was free  of disease and was a large graftable vessel.   Then the aorta was crossclamped and 500 mL of cold blood antegrade  cardioplegia were administered and aortic root with quick arrest of the  heart.  Systemic hypothermia to 20 degrees centigrade and topical  hypothermia with iced saline was used.  A temperature probe was placed in  the septum and an insulating pad in the pericardium.   The first distal anastomosis was performed to the intermediate coronary  artery.  The internal diameter of this vessel was about 1.6 mm.  The conduit  that was used was a segment of greater saphenous vein and the anastomosis  performed in a sequential side-to-side manner using continuous 7-0 Prolene  suture.  Flow was measured through the graft and was excellent.   The second distal anastomosis was performed to the obtuse marginal branch.  The internal diameter was about 1.75 mm.  The conduit that was used was the  same segment of greater saphenous vein and the anastomosis performed in a  sequential end-to-side manner using continuous 7-0 Prolene suture.  Flow was  measured through the graft and was excellent.   The third distal anastomosis was performed to the distal right coronary.  The internal diameter was greater than 2.5 mm.  The conduit that was used  was the second segment of greater saphenous vein and the anastomosis  performed in an end-to-side manner using continuous 7-0 Prolene suture.  Flow was measured through the graft and was excellent.  Then another dose of  cardioplegia was given down the vein grafts and in the aortic root.   The fourth distal anastomosis was performed to the distal portion of the  left anterior descending coronary.  The internal diameter was about 2 mm. The conduit that was used was a left internal mammary graft and this  was  brought through an opening in the left pericardium anterior to the phrenic  nerve.  It was anastomosed to the LAD in an end-to-side manner using  continuous 8-0 Prolene suture.  The pedicle was sutured to the epicardium  with 6-0 Prolene sutures.  The patient was rewarmed to 37 degrees  centigrade.  With the crossclamp in place, the 2 proximal vein graft  anastomoses were performed to the aortic root in an end-to-side manner using  continuous 6-0 Prolene suture.  Then the clamp was removed from the mammary  artery pedicle.  There  was rapid warming of the ventricular septum and  return of spontaneous ventricular fibrillation.  The crossclamp was removed  with a time of 77 minutes and the patient spontaneously converted to sinus  rhythm.   The proximal and distal anastomoses appeared hemostatic and the liens of the  graft satisfactory.  Graft markers were placed around the proximal  anastomoses.  Two temporary right ventricular and right atrial pacing wires  were placed and brought out through the skin.   When the patient had rewarmed to 37 degrees centigrade, he was weaned from  cardiopulmonary bypass on no inotropic agents.  Total bypass time was 92  minutes.  Cardiac function appeared excellent with a cardiac output of 4.5 L  a minute.  Protamine was given and the venous and aortic cannulas were  removed without difficulty.  Hemostasis was achieved.  Three chest tubes  were placed with a tube in the posterior pericardium, 1 in the left pleural  space and 1 in the anterior mediastinum.  The pericardium was not closed  over the heart.  The sternum was number with #6 stainless steel wires.  Fascia was closed with a continuous #1 Vicryl suture.  Subcutaneous tissue  was closed with a continuous 2-0 Vicryl and the skin with 3-0 Vicryl  subcuticular closure.  The lower extremity vein harvest site was closed in  layers in a similar manner.  The sponge, needles and instrument counts were   correct according to the scrub nurse.  Dry sterile dressings were applied  over the incisions and around the chest tubes, which were hooked to Pleur-  evac suction.  The patient remained hemodynamically stable and was  transported to the SICU in guarded but stable condition.      Evelene Croon, M.D.  Electronically Signed     BB/MEDQ  D:  12/06/2005  T:  12/07/2005  Job:  [id number]   cc:   Nicki Guadalajara, M.D.  Fax: [phone]   [facility] Cardiac Catheter[facility]

## 2011-05-17 NOTE — Discharge Summary (Signed)
NAMEKALANI, STHILAIRE NO.:  [id number]   MEDICAL RECORD NO.:  [id number]          PATIENT TYPE:  INP   LOCATION:  2038                         FACILITY:  [facility]   PHYSICIAN:  Rowe Clack, P.A.-C. DATE OF BIRTH:  December 28, 1929   DATE OF ADMISSION:  12/05/2005  DATE OF DISCHARGE:                                 DISCHARGE SUMMARY   Audio too short to transcribe (less than 5 seconds)      Rowe Clack, P.A.-C.     Sherryll Burger  D:  12/10/2005  T:  12/10/2005  Job:  [id number]

## 2011-05-17 NOTE — Op Note (Signed)
NAMEKENECHUKWU, ECKSTEIN            ACCOUNT NO.:  [id number]   MEDICAL RECORD NO.:  [id number]          PATIENT TYPE:  AMB   LOCATION:  OMED                         FACILITY:  [facility]   PHYSICIAN:  Boston Service, M.D.DATE OF BIRTH:  [birth year]   DATE OF PROCEDURE:  06/06/2006  DATE OF DISCHARGE:                                 OPERATIVE REPORT   PREOPERATIVE DIAGNOSIS:  6 by 8 mm calcification left distal ureter.   POSTOPERATIVE DIAGNOSIS:  6 by 8 mm calcification left distal ureter.   PROCEDURE:  Cystoscopy, retrograde ureteroscopy, and Holmium laser  fragmentation of distal ureteral calculus.   ANESTHESIA:  General.   DRAINS:  6-French 26 cm double-J stent.   SPECIMENS:  Stone fragments.   DESCRIPTION OF PROCEDURE:  The patient was prepped and draped in the dorsal  lithotomy position after institution of an adequate level of general  anesthesia.  A well lubricated 21-French panendoscope was gently inserted at  the urethral meatus.  A normal urethra and sphincter, clear efflux of the  right orifice, minimal efflux at the left orifice.  Retrograde films were  then performed using a blocking catheter.  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 No filling defect or obstruction  noted on the right.  The patient has multiple nonobstructive calculi within  the right kidney as well as other nonobstructive calculi within the left  kidney. The patient was noted to have a 6 x 8 mm calcification in the left  distal ureter just above his significant J deformity.  Once retrograde films  were obtained, a floppy tip guidewire was inserted at the left ureteral  orifice, negotiated carefully into the proximal ureter. As the wire was  advanced, the J deformity straightened out.  An Indole catheter was passed  over the guidewire, left in place for five minutes, and then withdrawn.  The  ureteroscope was inserted alongside the guidewire, the stone was localized  in the distal ureter, was pushed back into the more dilated proximal ureter.  The spiculated nature of the stone and 8 mm sized indicated that Holmium  fragmentation would probably be better than basket extraction.  A 365  Holmium fiber was selected at a setting of 0.5.  The stone was then gently  fragmented over a period of about 20-30 minutes. Once all fragments had been  reduced to a size of 1 mm or less, the Holmium fiber was withdrawn.  The  ureteroscope was reinserted.  Careful inspection to the limit of the short 6-  [place] scope showed no evidence of ureteral abnormalities or large remaining  fragments.  The ureteroscope was  withdrawn.  The cystoscope was reinserted and a 6-French 26 cm double-J  stent was inserted over the indwelling guidewire with excellent pigtail  formation on guidewire removal.  The bladder was drained.  The patient was  given a BNO suppository and returned to recovery in satisfactory condition.           ______________________________  Boston Service, M.D.     RH/MEDQ  D:  06/06/2006  T:  06/06/2006  Job:  [id number]  cc:   Winn Jock. Little, M.D.   Nicki Guadalajara, M.D.  Fax: [phone]

## 2011-06-03 ENCOUNTER — Other Ambulatory Visit: Payer: Self-pay | Admitting: Hematology & Oncology

## 2011-06-03 ENCOUNTER — Encounter (HOSPITAL_BASED_OUTPATIENT_CLINIC_OR_DEPARTMENT_OTHER): Payer: Medicare Other | Admitting: Hematology & Oncology

## 2011-06-03 DIAGNOSIS — D649 Anemia, unspecified: Secondary | ICD-10-CM

## 2011-06-03 DIAGNOSIS — D472 Monoclonal gammopathy: Secondary | ICD-10-CM

## 2011-06-03 DIAGNOSIS — N289 Disorder of kidney and ureter, unspecified: Secondary | ICD-10-CM

## 2011-06-03 LAB — CBC WITH DIFFERENTIAL (CANCER CENTER ONLY)
BASO%: 0.8 % (ref 0.0–2.0)
Eosinophils Absolute: 0.4 10*3/uL (ref 0.0–0.5)
HCT: 37.3 % — ABNORMAL LOW (ref 38.7–49.9)
LYMPH%: 28.7 % (ref 14.0–48.0)
MCH: 30.1 pg (ref 28.0–33.4)
MCV: 89 fL (ref 82–98)
MONO#: 0.5 10*3/uL (ref 0.1–0.9)
NEUT%: 54.5 % (ref 40.0–80.0)
RDW: 13.7 % (ref 11.1–15.7)
WBC: 5 10*3/uL (ref 4.0–10.0)

## 2011-06-05 LAB — COMPREHENSIVE METABOLIC PANEL
ALT: 22 U/L (ref 0–53)
BUN: 23 mg/dL (ref 6–23)
CO2: 26 mEq/L (ref 19–32)
Calcium: 9.5 mg/dL (ref 8.4–10.5)
Chloride: 105 mEq/L (ref 96–112)
Creatinine, Ser: 1.37 mg/dL — ABNORMAL HIGH (ref 0.50–1.35)
Glucose, Bld: 136 mg/dL — ABNORMAL HIGH (ref 70–99)

## 2011-06-05 LAB — KAPPA/LAMBDA LIGHT CHAINS
Kappa free light chain: 5.12 mg/dL — ABNORMAL HIGH (ref 0.33–1.94)
Kappa:Lambda Ratio: 1.01 (ref 0.26–1.65)
Lambda Free Lght Chn: 5.05 mg/dL — ABNORMAL HIGH (ref 0.57–2.63)

## 2011-06-05 LAB — SPEP & IFE WITH QIG
Albumin ELP: 53.9 % — ABNORMAL LOW (ref 55.8–66.1)
Alpha-2-Globulin: 12.3 % — ABNORMAL HIGH (ref 7.1–11.8)
Beta Globulin: 6.1 % (ref 4.7–7.2)
IgG (Immunoglobin G), Serum: 1130 mg/dL (ref 650–1600)
M-Spike, %: 0.43 g/dL
Total Protein, Serum Electrophoresis: 7 g/dL (ref 6.0–8.3)

## 2011-08-09 ENCOUNTER — Encounter: Payer: Self-pay | Admitting: Emergency Medicine

## 2011-08-09 ENCOUNTER — Ambulatory Visit (INDEPENDENT_AMBULATORY_CARE_PROVIDER_SITE_OTHER): Payer: Medicare Other | Admitting: Emergency Medicine

## 2011-08-09 VITALS — BP 114/78 | HR 80 | Temp 98.3°F | Ht 70.0 in | Wt 163.0 lb

## 2011-08-09 DIAGNOSIS — J449 Chronic obstructive pulmonary disease, unspecified: Secondary | ICD-10-CM

## 2011-08-09 MED ORDER — TIOTROPIUM BROMIDE MONOHYDRATE 18 MCG IN CAPS: 18.0000 ug | ORAL_CAPSULE | Freq: Every day | RESPIRATORY_TRACT | Status: DC

## 2011-08-09 MED ORDER — BUDESONIDE-FORMOTEROL FUMARATE 80-4.5 MCG/ACT IN AERO: 2.0000 | INHALATION_SPRAY | Freq: Two times a day (BID) | RESPIRATORY_TRACT | Status: DC

## 2011-08-09 NOTE — Patient Instructions (Signed)
Walking oximetry today  Continue your Spiriva and Symbicort as you are taking them  Continue to work on building up your stamina by exercising Follow up with Dr Delton Coombes in 6 months.

## 2011-08-09 NOTE — Progress Notes (Signed)
  77yo man with COPD, CAD/CABG (06) followed by Dr Tresa Endo.   01/12/09 - was restarted on Symbicort 160/4.5 two times a day by Dr Sherene Sires last time. Feels his breathing is better, not using ProAir frequently. Also on Spiriva once a day.   06/01/09 -- Regular follow up visit. Has been doing well since last visit. No exertional dyspnea, able to participate in [employer]. No wheezing, no coughing.   ROV 11/06/09 -- Returns today for regular follow up. Has been doing well, takes Spiriva + Symbicort. Stays very active. Hasn't required ProAir at all. Continues to follow with Dr Tresa Endo - he is due for a stress test and follow up. Has been having more bruising when he bumps his hands.   ROV 05/21/10 -- returns for f/u COPD. last time we changed Symbicort to 80/4.5 two times a day. Also on Spiriva. Still having SOB with exertion, like working in the shed, the yard. He has not had any exacerbations. Family is concerned that he may have lost some ground since last visit in exertional tolerance. Still competes in the [employer].   ROV 08/09/11 -- COPD, maintained on . Since our last visit he has been admitted for C diff and subsequent ureteral obstruction with renal failure (12/11 - 1/12). Went to [facility], then to [facility] for PT. He tells me that he stopped his Spiriva and Symbicort from March to June, now back on them. His breathing is better since restarting, but he is not near baseline. No real wheeze,  No cough.   Gen: Pleasant, well-nourished, in no distress,  normal affect  ENT: No lesions,  mouth clear,  oropharynx clear, no postnasal drip  Neck: No JVD, no TMG, no carotid bruits  Lungs: No use of accessory muscles, distant, no wheeze or crackles.   Cardiovascular: RRR, heart sounds normal, no murmur or gallops, no peripheral edema  Musculoskeletal: No deformities, no cyanosis or clubbing  Neuro: alert, non focal  Skin: Warm, no lesions or rashes  C O P D I think his current limitations still reflect  his deconditioning following a serious hospitalization and long recovery. Would like to rule out occult hypoxemia or any other barriers to his continued rehab and recovery   Walking oximetry today Continue spiriva + symbicort  Follow up in 6 months or prn

## 2011-08-09 NOTE — Assessment & Plan Note (Addendum)
I think his current limitations still reflect his deconditioning following a serious hospitalization and long recovery. Would like to rule out occult hypoxemia or any other barriers to his continued rehab and recovery   Walking oximetry today Continue spiriva + symbicort  Follow up in 6 months or prn

## 2011-09-09 ENCOUNTER — Encounter (HOSPITAL_BASED_OUTPATIENT_CLINIC_OR_DEPARTMENT_OTHER): Payer: Medicare Other | Admitting: Hematology & Oncology

## 2011-09-09 ENCOUNTER — Other Ambulatory Visit: Payer: Self-pay | Admitting: Hematology & Oncology

## 2011-09-09 DIAGNOSIS — N289 Disorder of kidney and ureter, unspecified: Secondary | ICD-10-CM

## 2011-09-09 DIAGNOSIS — D472 Monoclonal gammopathy: Secondary | ICD-10-CM

## 2011-09-09 DIAGNOSIS — D649 Anemia, unspecified: Secondary | ICD-10-CM

## 2011-09-09 DIAGNOSIS — N189 Chronic kidney disease, unspecified: Secondary | ICD-10-CM

## 2011-09-09 LAB — CBC WITH DIFFERENTIAL (CANCER CENTER ONLY)
BASO%: 0.4 % (ref 0.0–2.0)
EOS%: 1.9 % (ref 0.0–7.0)
LYMPH%: 23 % (ref 14.0–48.0)
MCH: 32 pg (ref 28.0–33.4)
MCHC: 34.2 g/dL (ref 32.0–35.9)
MCV: 94 fL (ref 82–98)
MONO%: 10.8 % (ref 0.0–13.0)
NEUT%: 63.9 % (ref 40.0–80.0)
Platelets: 148 10*3/uL (ref 145–400)
RDW: 13.4 % (ref 11.1–15.7)
WBC: 4.7 10*3/uL (ref 4.0–10.0)

## 2011-09-09 LAB — CHCC SATELLITE - SMEAR

## 2011-09-11 LAB — PROTEIN ELECTROPHORESIS, SERUM
Alpha-1-Globulin: 4.8 % (ref 2.9–4.9)
Alpha-2-Globulin: 11.7 % (ref 7.1–11.8)
Beta 2: 5 % (ref 3.2–6.5)
Gamma Globulin: 16.1 % (ref 11.1–18.8)

## 2011-09-11 LAB — RETICULOCYTES (CHCC)
ABS Retic: 43 10*3/uL (ref 19.0–186.0)
RBC.: 3.91 MIL/uL — ABNORMAL LOW (ref 4.22–5.81)
Retic Ct Pct: 1.1 % (ref 0.4–2.3)

## 2011-09-11 LAB — FERRITIN: Ferritin: 51 ng/mL (ref 22–322)

## 2011-10-03 LAB — DIFFERENTIAL
Basophils Absolute: 0 10*3/uL (ref 0.0–0.1)
Eosinophils Absolute: 0.4 10*3/uL (ref 0.0–0.7)
Eosinophils Relative: 7 % — ABNORMAL HIGH (ref 0–5)
Lymphs Abs: 1.2 10*3/uL (ref 0.7–4.0)
Monocytes Absolute: 0.4 10*3/uL (ref 0.1–1.0)

## 2011-10-03 LAB — CBC
HCT: 37.3 % — ABNORMAL LOW (ref 39.0–52.0)
Hemoglobin: 12.4 g/dL — ABNORMAL LOW (ref 13.0–17.0)
MCV: 92.9 fL (ref 78.0–100.0)
Platelets: 140 10*3/uL — ABNORMAL LOW (ref 150–400)
RDW: 12.9 % (ref 11.5–15.5)

## 2011-10-03 LAB — POCT CARDIAC MARKERS

## 2011-10-18 ENCOUNTER — Telehealth: Payer: Self-pay | Admitting: Emergency Medicine

## 2011-10-18 NOTE — Telephone Encounter (Signed)
Called and spoke with [name] and she stated that the spiriva is too expensive and they are requesting an alternative if any to this med.  Pt is aware that RB is not in the office today and it will be Monday before she gets a call back and [name] is ok with this.  RB  Please advise if an alternative is avaliable.

## 2011-10-23 NOTE — Telephone Encounter (Signed)
Randa Evens called back.  States she was calling to follow up on this - was under the impression RB was back in this past Monday.  I apologized for this miscommunication and advised he will not be back in until Monday, Oct 29.  She is ok with this but states pt is completely out of spiriva.  Advised I would place 1 sample at the front for pick up to last until Monday when RB returns to office.  Randa Evens verbalized understanding of this and would also like me to inform RB that they are open to suggestions regarding switching spiriva and would be open to switching both spiriva and symbicort if RB knows of another medication that is more cost effective but would still help pt's breathing.  Advised I would send message to Dr. Delton Coombes to address and we will call back with his response.  Randa Evens verbalized understanding of this.  Spiriva Lot # [id number] A Exp Date 10/2012

## 2011-10-23 NOTE — Telephone Encounter (Signed)
[email or web address] - still awaiting RB's response regarding this.  He will be back in the office on Monday, Oct 29.   ? Was joanne calling to check on the status of this ?

## 2011-10-23 NOTE — Telephone Encounter (Signed)
[name] returning call.

## 2011-10-28 NOTE — Telephone Encounter (Signed)
LMTCB

## 2011-10-28 NOTE — Telephone Encounter (Signed)
We could change him to albuterol/atrovent nebs qid - this would be the most cost-effective option. We would probably lose some benefit since these are not long-acting meds. I am willing to make the change if the cost has become unmanageable.

## 2011-10-29 NOTE — Telephone Encounter (Signed)
Pt daughter aware of option for neb meds. She states she will talk to her father and ask him if he will agree to this and call us back and let us know. Carron Curie, CMA

## 2011-10-30 ENCOUNTER — Telehealth: Payer: Self-pay | Admitting: Emergency Medicine

## 2011-10-30 NOTE — Telephone Encounter (Signed)
OK thank you 

## 2011-10-30 NOTE — Telephone Encounter (Signed)
I spoke with pt daughter Chyrl Civatte and she states that she spoke with the pt and has decided he just wants to stay on the spiriva and symbicort. Chyrl Civatte states it more easy to use than having to do a medication 4 times a day. Chyrl Civatte states they have made some financial plans to cover for these 2 medications. They just wanted to make Dr. Delton Coombes aware of this. Will forward this to Dr. Delton Coombes as an Lorain Childes.

## 2011-12-05 ENCOUNTER — Other Ambulatory Visit (HOSPITAL_BASED_OUTPATIENT_CLINIC_OR_DEPARTMENT_OTHER): Payer: Medicare Other | Admitting: Lab

## 2011-12-05 ENCOUNTER — Other Ambulatory Visit: Payer: Self-pay | Admitting: Hematology & Oncology

## 2011-12-05 ENCOUNTER — Encounter: Payer: Self-pay | Admitting: Hematology & Oncology

## 2011-12-05 ENCOUNTER — Ambulatory Visit (HOSPITAL_BASED_OUTPATIENT_CLINIC_OR_DEPARTMENT_OTHER): Payer: Medicare Other | Admitting: Hematology & Oncology

## 2011-12-05 DIAGNOSIS — D509 Iron deficiency anemia, unspecified: Secondary | ICD-10-CM

## 2011-12-05 DIAGNOSIS — N189 Chronic kidney disease, unspecified: Secondary | ICD-10-CM | POA: Insufficient documentation

## 2011-12-05 DIAGNOSIS — D472 Monoclonal gammopathy: Secondary | ICD-10-CM

## 2011-12-05 DIAGNOSIS — D649 Anemia, unspecified: Secondary | ICD-10-CM

## 2011-12-05 DIAGNOSIS — N289 Disorder of kidney and ureter, unspecified: Secondary | ICD-10-CM

## 2011-12-05 DIAGNOSIS — D631 Anemia in chronic kidney disease: Secondary | ICD-10-CM

## 2011-12-05 HISTORY — DX: Anemia in chronic kidney disease: D63.1

## 2011-12-05 HISTORY — DX: Monoclonal gammopathy: D47.2

## 2011-12-05 HISTORY — DX: Iron deficiency anemia, unspecified: D50.9

## 2011-12-05 LAB — CBC WITH DIFFERENTIAL (CANCER CENTER ONLY)
BASO#: 0 10*3/uL (ref 0.0–0.2)
BASO%: 0.4 % (ref 0.0–2.0)
HCT: 36 % — ABNORMAL LOW (ref 38.7–49.9)
HGB: 12 g/dL — ABNORMAL LOW (ref 13.0–17.1)
LYMPH#: 1.1 10*3/uL (ref 0.9–3.3)
LYMPH%: 24.4 % (ref 14.0–48.0)
MCV: 97 fL (ref 82–98)
MONO#: 0.5 10*3/uL (ref 0.1–0.9)
NEUT%: 59.2 % (ref 40.0–80.0)
RDW: 12.3 % (ref 11.1–15.7)
WBC: 4.5 10*3/uL (ref 4.0–10.0)

## 2011-12-05 LAB — RETICULOCYTES (CHCC)
ABS Retic: 38.1 10*3/uL (ref 19.0–186.0)
ABS Retic: 38.1 10*3/uL (ref 19.0–186.0)
RBC.: 3.81 MIL/uL — ABNORMAL LOW (ref 4.22–5.81)
Retic Ct Pct: 1 % (ref 0.4–2.3)

## 2011-12-05 NOTE — Progress Notes (Signed)
This office note has been dictated.

## 2011-12-05 NOTE — Progress Notes (Signed)
CC:   Nicki Guadalajara, M.D. Burnell Blanks, MD Mindi Slicker. Lowell Guitar, M.D.  DIAGNOSES: 1. 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 2. Anemia secondary to renal insufficiency.  CURRENT THERAPY:  Observation.  INTERIM HISTORY:  Mr. Skorupski comes in for followup.  He is really doing well.  He looks great.  He has really had no issues since we started seeing him.  We initially saw him, I think, back in the spring. We have not had to do anything with him as his anemia when we initially saw him was really not all that bad.  We are following his MGUS.  When we saw him in September, his monoclonal spike was 0.37 g/dL.  His ferritin at that time was 51.  He is eating well.  He is not having any problems with pain.  He is having no leg swelling.  There have been no rashes. He has had no dyspnea or shortness of breath.  PHYSICAL EXAM:  General:  This is an elderly white gentleman in no obvious distress.  Vital Signs:  Temperature 97, pulse 73, respiratory rate 18, blood pressure 154/80.  Weight is 166.  Head/Neck: Normocephalic, atraumatic skull.  There are no ocular or oral lesions. There are no palpable cervical or supraclavicular lymph nodes.  Lungs: Clear bilaterally.  There are no rales, wheezes or rhonchi.  Cardiac: Regular rate and rhythm with a normal S1 and S2.  He has a 1/6 systolic ejection murmur.  Abdomen:  Soft.  Good bowel sounds.  There is no palpable abdominal mass.  There is no fluid wave.  There is no palpable hepatosplenomegaly.  Back:  Exam shows no tenderness over the spine, ribs, or hips.  Extremities:  No clubbing, cyanosis or edema.  He is missing the index finger from his left hand.  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  Neurologic:  Exam shows no focal neurological deficits.  LABORATORY STUDIES:  White cell count is 4.5, hemoglobin 12, hematocrit 36, platelet count 155.  IMPRESSION:  Mr. Chittick is an 77 year old gentleman  with multifactorial anemia.  He does have renal insufficiency.  It is hard to say if there is any significance to his monoclonal gammopathy of undetermined significance.  At his age, I think it would be very natural to have an MGUS.  So far, the MGUS mass has not shown itself to be a "problem."  We will plan to get him back in another 3 months or so for followup.  I do not see the need to get him back in between visits for any blood work.  Mr. Rushlow was very kind and brought me in a birdhouse.  I am very humbled by the gesture for the Christmas holidays.    ______________________________ Josph Macho, M.D. PRE/MEDQ  D:  12/05/2011  T:  12/05/2011  Job:  [id number]

## 2011-12-06 LAB — RETICULOCYTES (CHCC)
ABS Retic: 38.1 10*3/uL (ref 19.0–186.0)
Retic Ct Pct: 1 % (ref 0.4–2.3)

## 2011-12-06 LAB — LACTATE DEHYDROGENASE: LDH: 194 U/L (ref 94–250)

## 2011-12-06 LAB — IRON AND TIBC
TIBC: 322 ug/dL (ref 215–435)
UIBC: 263 ug/dL (ref 125–400)

## 2011-12-09 ENCOUNTER — Other Ambulatory Visit: Payer: Self-pay | Admitting: Hematology & Oncology

## 2011-12-09 DIAGNOSIS — D509 Iron deficiency anemia, unspecified: Secondary | ICD-10-CM

## 2011-12-09 LAB — SPEP & IFE WITH QIG
Albumin ELP: 57.2 % (ref 55.8–66.1)
Alpha-1-Globulin: 4.8 % (ref 2.9–4.9)
Beta 2: 4.6 % (ref 3.2–6.5)
Beta Globulin: 5.5 % (ref 4.7–7.2)

## 2011-12-09 LAB — IRON AND TIBC
%SAT: 18 % — ABNORMAL LOW (ref 20–55)
Iron: 59 ug/dL (ref 42–165)
TIBC: 322 ug/dL (ref 215–435)
UIBC: 263 ug/dL (ref 125–400)

## 2011-12-09 LAB — KAPPA/LAMBDA LIGHT CHAINS: Kappa:Lambda Ratio: 1.11 (ref 0.26–1.65)

## 2011-12-09 MED ORDER — FERUMOXYTOL INJECTION 510 MG/17 ML: 510.0000 mg | Freq: Once | INTRAVENOUS | Status: DC

## 2011-12-10 ENCOUNTER — Ambulatory Visit (HOSPITAL_BASED_OUTPATIENT_CLINIC_OR_DEPARTMENT_OTHER): Payer: Medicare Other

## 2011-12-10 VITALS — BP 141/74 | HR 70 | Temp 97.6°F | Wt 166.0 lb

## 2011-12-10 DIAGNOSIS — D649 Anemia, unspecified: Secondary | ICD-10-CM

## 2011-12-10 DIAGNOSIS — N289 Disorder of kidney and ureter, unspecified: Secondary | ICD-10-CM

## 2011-12-10 DIAGNOSIS — D631 Anemia in chronic kidney disease: Secondary | ICD-10-CM

## 2011-12-10 MED ORDER — FERUMOXYTOL INJECTION 510 MG/17 ML
510.0000 mg | Freq: Once | INTRAVENOUS | Status: AC
  Administered 2011-12-10: 510 mg via INTRAVENOUS
  Filled 2011-12-10: qty 17

## 2012-01-15 DIAGNOSIS — H903 Sensorineural hearing loss, bilateral: Secondary | ICD-10-CM | POA: Diagnosis not present

## 2012-01-28 DIAGNOSIS — N200 Calculus of kidney: Secondary | ICD-10-CM | POA: Diagnosis not present

## 2012-02-11 ENCOUNTER — Ambulatory Visit (INDEPENDENT_AMBULATORY_CARE_PROVIDER_SITE_OTHER): Payer: Medicare Other | Admitting: Emergency Medicine

## 2012-02-11 ENCOUNTER — Encounter: Payer: Self-pay | Admitting: Emergency Medicine

## 2012-02-11 DIAGNOSIS — J449 Chronic obstructive pulmonary disease, unspecified: Secondary | ICD-10-CM | POA: Diagnosis not present

## 2012-02-11 NOTE — Patient Instructions (Signed)
Please continue Spiriva and Symbicort Use ProAir 2 puffs if needed for shortness of breath Follow with Dr Delton Coombes in 6 months or sooner if you have any problems.

## 2012-02-11 NOTE — Assessment & Plan Note (Signed)
Stable at this time Will continue same meds, f/u in 6 mo or prn

## 2012-02-11 NOTE — Progress Notes (Signed)
  78 yo man with COPD, CAD/CABG (06) followed by Dr Tresa Endo.   01/12/09 - was restarted on Symbicort 160/4.5 two times a day by Dr Sherene Sires last time. Feels his breathing is better, not using ProAir frequently. Also on Spiriva once a day.   06/01/09 -- Regular follow up visit. Has been doing well since last visit. No exertional dyspnea, able to participate in [employer]. No wheezing, no coughing.   ROV 11/06/09 -- Returns today for regular follow up. Has been doing well, takes Spiriva + Symbicort. Stays very active. Hasn't required ProAir at all. Continues to follow with Dr Tresa Endo - he is due for a stress test and follow up. Has been having more bruising when he bumps his hands.   ROV 05/21/10 -- returns for f/u COPD. last time we changed Symbicort to 80/4.5 two times a day. Also on Spiriva. Still having SOB with exertion, like working in the shed, the yard. He has not had any exacerbations. Family is concerned that he may have lost some ground since last visit in exertional tolerance. Still competes in the [employer].   ROV 08/09/11 -- COPD, maintained on Spiriva + Symbicort. Since our last visit he has been admitted for C diff and subsequent ureteral obstruction with renal failure (12/11 - 1/12). Went to [facility], then to [facility] for PT. He tells me that he stopped his Spiriva and Symbicort from March to June, now back on them. His breathing is better since restarting, but he is not near baseline. No real wheeze,  No cough.   ROV 02/11/12 -- COPD on Spiriva + Symbicort (restarted just prior to last visit). Also followed for MGUS by Dr Monika Salk.  He continues to improve post-hospitalization. Does get some dyspnea w heavy exertion. Continues to do yard work and to bowl.  He thinks breathing is about the same. No flares, no hospitalizations.    Gen: Pleasant, well-nourished, in no distress,  normal affect  ENT: No lesions,  mouth clear,  oropharynx clear, no postnasal drip  Neck: No JVD, no TMG, no carotid  bruits  Lungs: No use of accessory muscles, distant, no wheeze or crackles.   Cardiovascular: RRR, heart sounds normal, no murmur or gallops, no peripheral edema  Musculoskeletal: No deformities, no cyanosis or clubbing  Neuro: alert, non focal  Skin: Warm, no lesions or rashes  C O P D Stable at this time Will continue same meds, f/u in 6 mo or prn

## 2012-02-19 DIAGNOSIS — N139 Obstructive and reflux uropathy, unspecified: Secondary | ICD-10-CM | POA: Diagnosis not present

## 2012-02-19 DIAGNOSIS — N200 Calculus of kidney: Secondary | ICD-10-CM | POA: Diagnosis not present

## 2012-03-01 ENCOUNTER — Other Ambulatory Visit: Payer: Self-pay | Admitting: Emergency Medicine

## 2012-03-05 ENCOUNTER — Ambulatory Visit (HOSPITAL_BASED_OUTPATIENT_CLINIC_OR_DEPARTMENT_OTHER): Payer: Medicare Other | Admitting: Hematology & Oncology

## 2012-03-05 ENCOUNTER — Other Ambulatory Visit (HOSPITAL_BASED_OUTPATIENT_CLINIC_OR_DEPARTMENT_OTHER): Payer: Medicare Other | Admitting: Lab

## 2012-03-05 DIAGNOSIS — D649 Anemia, unspecified: Secondary | ICD-10-CM

## 2012-03-05 DIAGNOSIS — D631 Anemia in chronic kidney disease: Secondary | ICD-10-CM | POA: Diagnosis not present

## 2012-03-05 DIAGNOSIS — D509 Iron deficiency anemia, unspecified: Secondary | ICD-10-CM

## 2012-03-05 DIAGNOSIS — N289 Disorder of kidney and ureter, unspecified: Secondary | ICD-10-CM | POA: Diagnosis not present

## 2012-03-05 DIAGNOSIS — N189 Chronic kidney disease, unspecified: Secondary | ICD-10-CM

## 2012-03-05 DIAGNOSIS — D472 Monoclonal gammopathy: Secondary | ICD-10-CM

## 2012-03-05 LAB — CBC WITH DIFFERENTIAL (CANCER CENTER ONLY)
BASO#: 0 10*3/uL (ref 0.0–0.2)
BASO%: 0.7 % (ref 0.0–2.0)
EOS%: 1.5 % (ref 0.0–7.0)
LYMPH#: 1.1 10*3/uL (ref 0.9–3.3)
MCH: 32 pg (ref 28.0–33.4)
MCHC: 33.4 g/dL (ref 32.0–35.9)
MONO%: 11.4 % (ref 0.0–13.0)
NEUT#: 2.9 10*3/uL (ref 1.5–6.5)
Platelets: 154 10*3/uL (ref 145–400)
RDW: 12.9 % (ref 11.1–15.7)

## 2012-03-05 NOTE — Progress Notes (Signed)
This office note has been dictated.

## 2012-03-06 ENCOUNTER — Other Ambulatory Visit: Payer: Self-pay | Admitting: Emergency Medicine

## 2012-03-06 ENCOUNTER — Telehealth: Payer: Self-pay | Admitting: Emergency Medicine

## 2012-03-06 MED ORDER — TIOTROPIUM BROMIDE MONOHYDRATE 18 MCG IN CAPS: 18.0000 ug | ORAL_CAPSULE | Freq: Every day | RESPIRATORY_TRACT | Status: DC

## 2012-03-06 NOTE — Telephone Encounter (Signed)
Pt's daughter is aware that RX sent to [facility] in [place].

## 2012-03-06 NOTE — Progress Notes (Signed)
CC:   Nicki Guadalajara, M.D. Mindi Slicker. Lowell Guitar, M.D. Burnell Blanks, MD  DIAGNOSES: 1. IgG lambda MGUS. 2. 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  CURRENT THERAPY:  IV iron as indicated.  INTERIM HISTORY:  Mr. Christmas comes in for followup.  He is looking good.  He is feeling pretty good.  We last saw him back in December.  So far, we have not seen any evidence of progression of his monoclonal gammopathy.  When we saw back in December, his ferritin was 49 with iron saturation of only 18%.  He did go ahead and get a dose of IV iron back on 12/11. He got 510 mg.  His monoclonal studies back in December showed a monoclonal spike of 0.35 mg/dL.  His IgG level was 988 mg/dL.  His serum kappa light chain was 5.23 mg/dL.  He has had no bony pain.  He has had no change in bowel or bladder habits.  He has had no fevers, sweats or chills.  He has had some mild renal insufficiency.  PHYSICAL EXAMINATION:  This is a elderly but well-nourished white gentleman in no obvious distress.  Vital signs:  Temperature of 98.4, pulse 91, respiratory rate 18,  blood pressure 123/67.  Weight is 169. Head and neck exam shows a normocephalic, atraumatic skull.  There are no ocular or oral lesions.  There is no palpable cervical or supraclavicular lymph nodes.  Heart:  He has 1/6 systolic ejection murmur.  Abdomen:  Soft with good bowel sounds. There is no  palpable abdominal mass.  There is no palpable hepatosplenomegaly.  Extremities: No  clubbing, cyanosis or edema.  Skin:  No rashes, ecchymosis or petechia.  LABORATORY STUDIES:  White cell count 4.6, hemoglobin 12.3, hematocrit 36.8, platelet count 154.  MCV is 96.  IMPRESSION:  Mr. Hunnell is an 78 year old gentleman with an IgG lambda MGUS.  Currently, this is not a problem from my point of view.  I do not see any evidence of progression to myeloma or smoldering myeloma.  I think the iron that we gave him did help him out a  little bit.  I think we can probably get him back in about 3 months now.  I do not see that we need to do any blood work in between visits.    ______________________________ Josph Macho, M.D. PRE/MEDQ  D:  03/05/2012  T:  03/06/2012  Job:  [id number]   ADDENDUM:  Ferritin is 142. %Sat is 24.  IgG is 1020 mg/dL.  Kappa is 6.79mg /dL.

## 2012-03-06 NOTE — Telephone Encounter (Signed)
PT'S LAST OV WAS 02/11/12 MED LIST HAS SPIRIVIA ON IT

## 2012-03-09 ENCOUNTER — Other Ambulatory Visit: Payer: Self-pay | Admitting: Emergency Medicine

## 2012-03-09 LAB — PROTEIN ELECTROPHORESIS, SERUM, WITH REFLEX
Albumin ELP: 56.8 % (ref 55.8–66.1)
Alpha-1-Globulin: 4.6 % (ref 2.9–4.9)
Alpha-2-Globulin: 12 % — ABNORMAL HIGH (ref 7.1–11.8)
Beta 2: 4.9 % (ref 3.2–6.5)
Beta Globulin: 5.2 % (ref 4.7–7.2)
Total Protein, Serum Electrophoresis: 6.7 g/dL (ref 6.0–8.3)

## 2012-03-09 LAB — COMPREHENSIVE METABOLIC PANEL
Albumin: 4.1 g/dL (ref 3.5–5.2)
Alkaline Phosphatase: 175 U/L — ABNORMAL HIGH (ref 39–117)
BUN: 20 mg/dL (ref 6–23)
CO2: 27 mEq/L (ref 19–32)
Calcium: 9.3 mg/dL (ref 8.4–10.5)
Chloride: 107 mEq/L (ref 96–112)
Glucose, Bld: 71 mg/dL (ref 70–99)
Potassium: 4.2 mEq/L (ref 3.5–5.3)

## 2012-03-09 LAB — IRON AND TIBC
Iron: 63 ug/dL (ref 42–165)
TIBC: 264 ug/dL (ref 215–435)
UIBC: 201 ug/dL (ref 125–400)

## 2012-03-09 LAB — KAPPA/LAMBDA LIGHT CHAINS: Kappa free light chain: 6.97 mg/dL — ABNORMAL HIGH (ref 0.33–1.94)

## 2012-03-09 LAB — FERRITIN: Ferritin: 154 ng/mL (ref 22–322)

## 2012-03-10 ENCOUNTER — Telehealth: Payer: Self-pay | Admitting: *Deleted

## 2012-03-10 NOTE — Telephone Encounter (Signed)
Called daughter to let her know that her dads labwork looks good per dr. Myna Hidalgo.

## 2012-03-10 NOTE — Telephone Encounter (Signed)
Message copied by Anselm Jungling on Tue Mar 10, 2012 12:54 PM ------      Message from: Arlan Organ R      Created: Mon Mar 09, 2012  6:53 PM       Call dgtr - her dad's labs are stable, Iron is ok.

## 2012-03-30 IMAGING — US US RENAL
1 series · 14 of 25 positions shown · non-contrast
Comparison: None

CLINICAL DATA: Acute renal failure

RENAL/URINARY TRACT ULTRASOUND COMPLETE

[Series 1: us renal · 0.28mm/px · 14 of 59 slices shown]
[im 1/59]
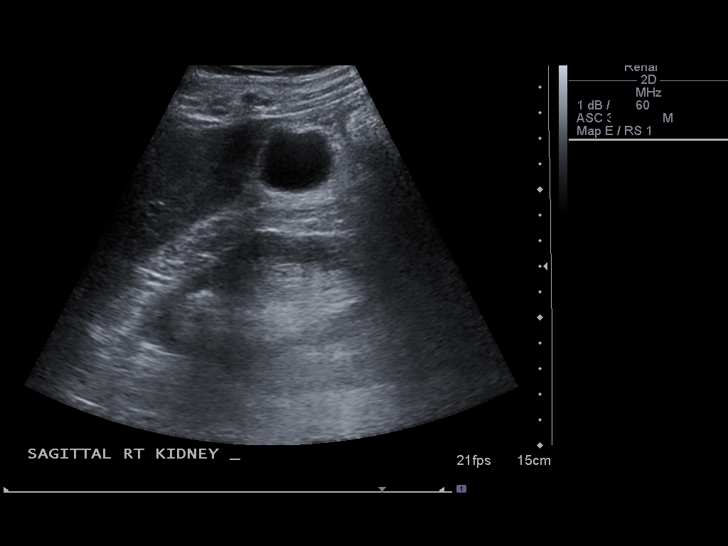
[im 5/59]
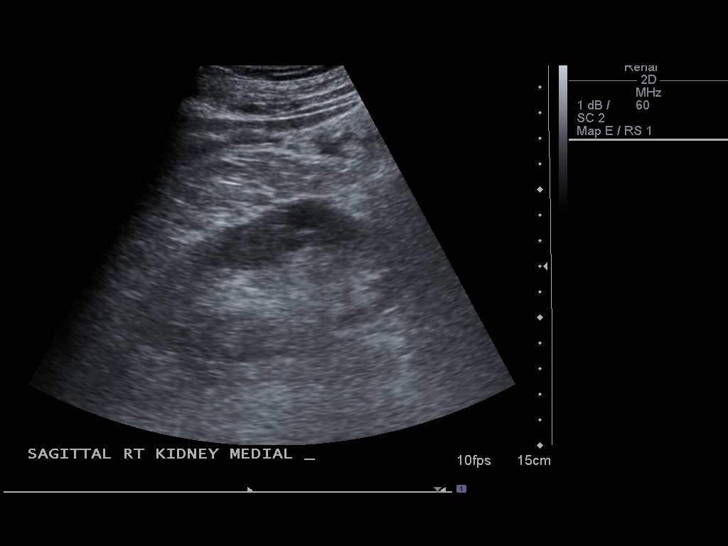
[im 10/59]
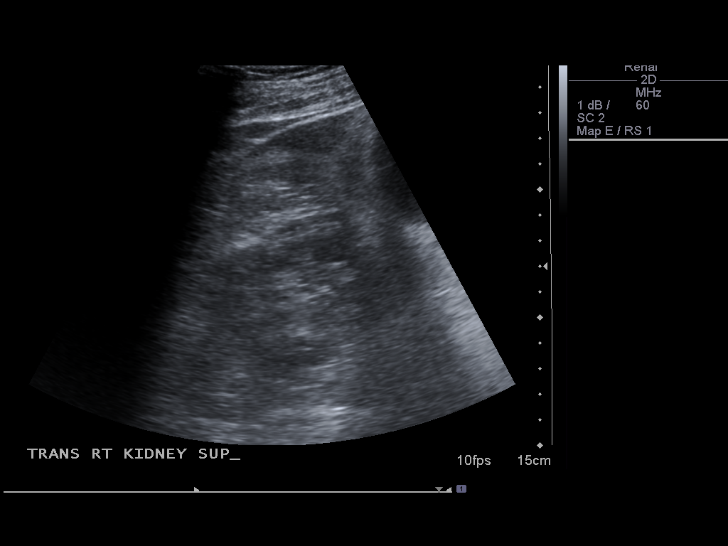
[im 15/59]
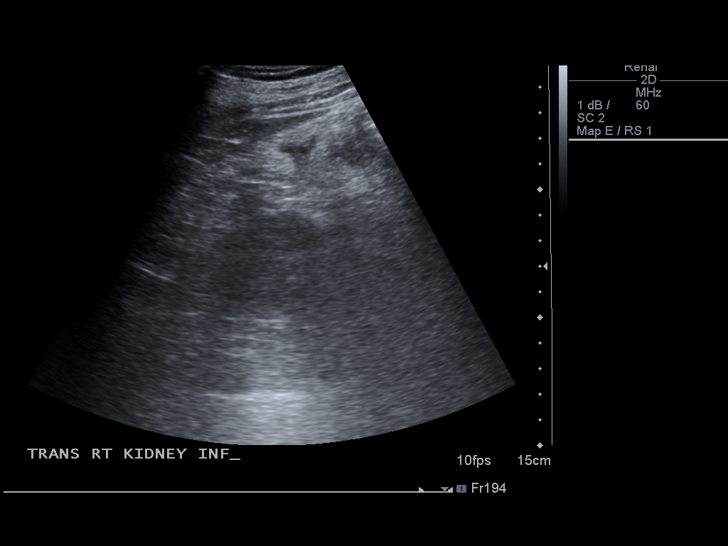
[im 20/59]
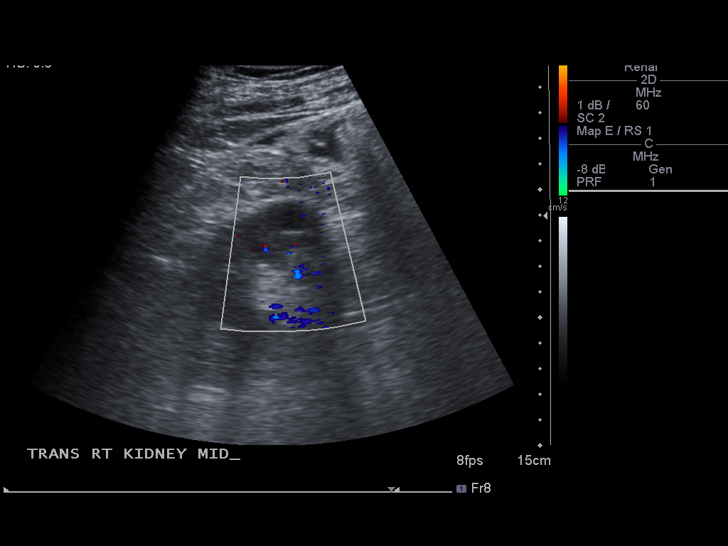
[im 22/59]
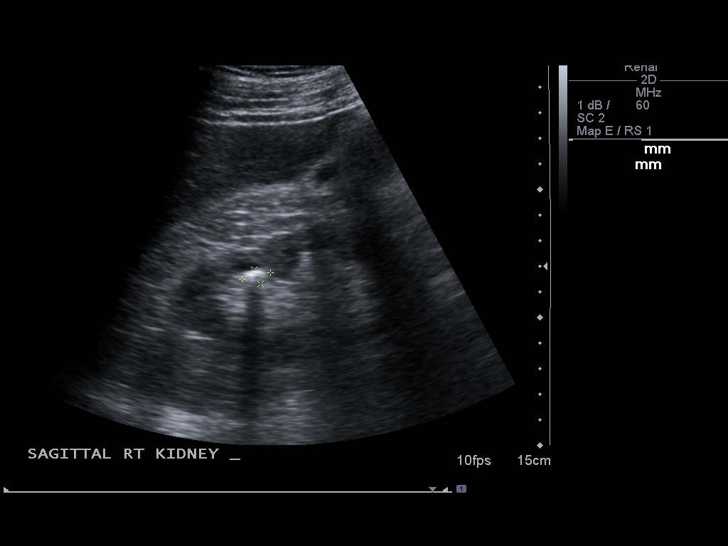
[im 27/59]
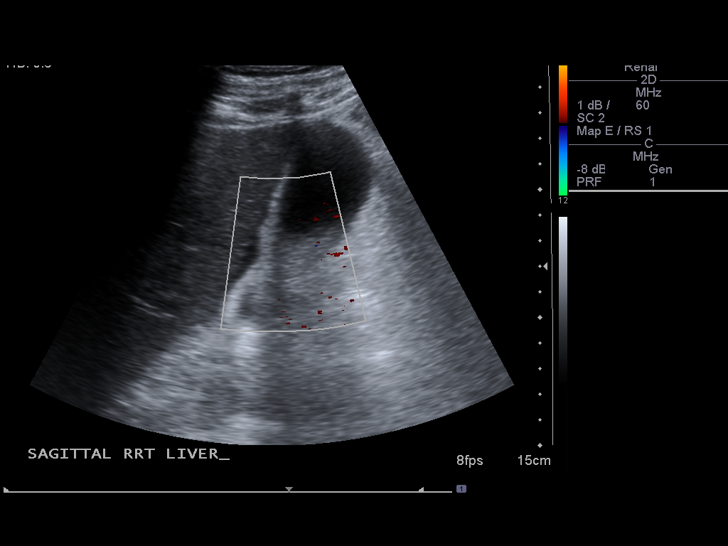
[im 32/59]
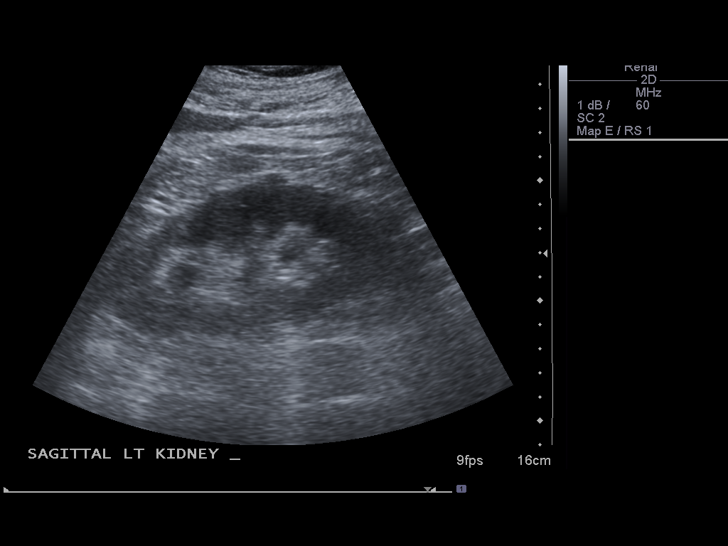
[im 37/59]
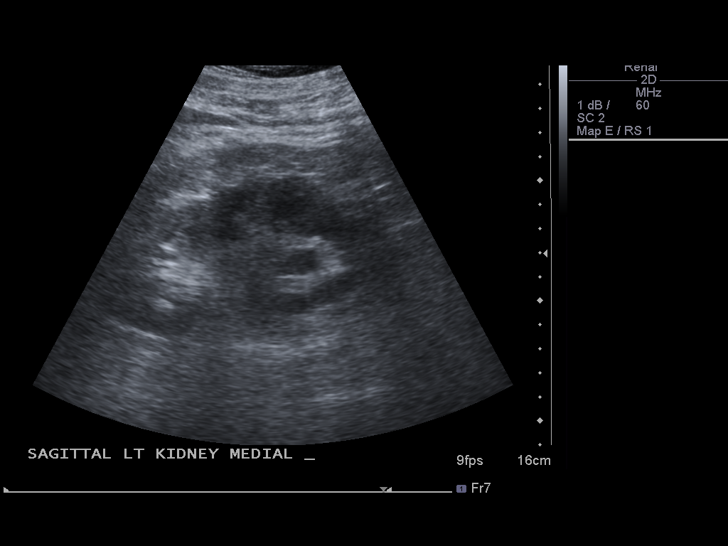
[im 39/59]
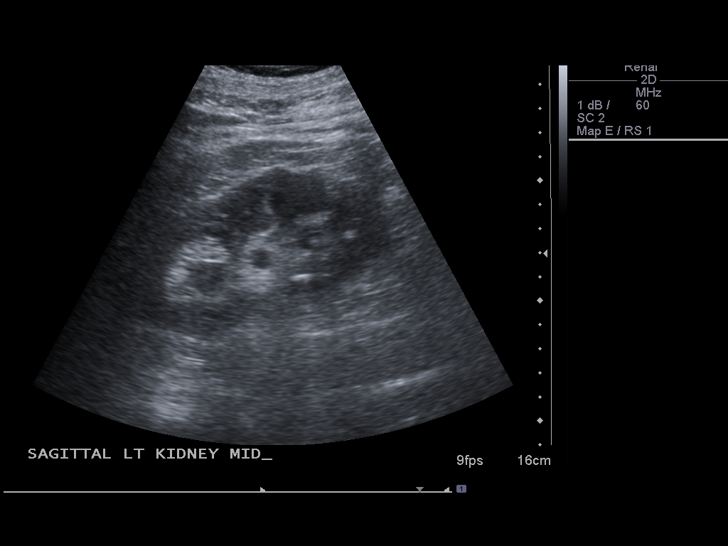
[im 44/59]
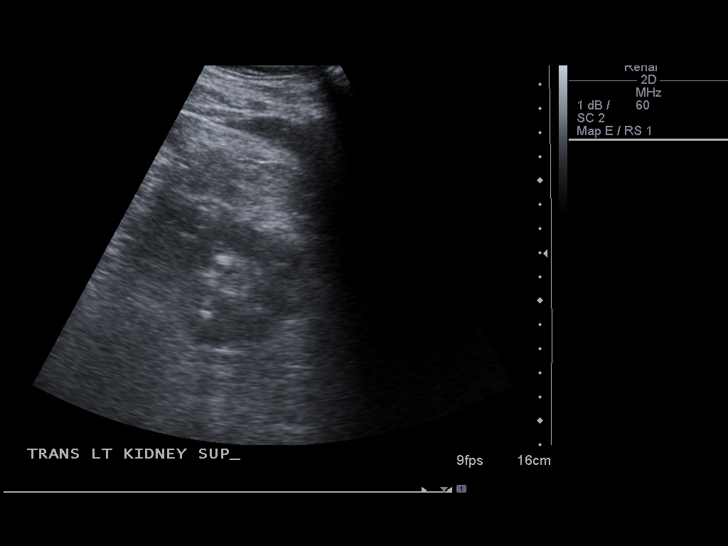
[im 49/59]
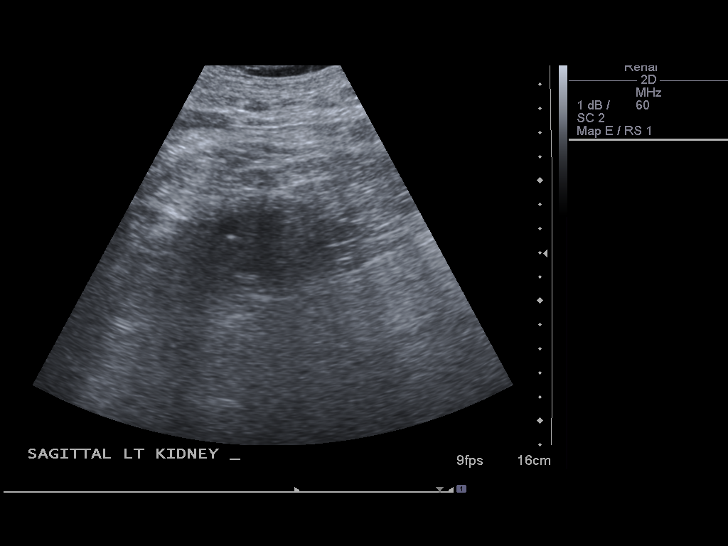
[im 54/59]
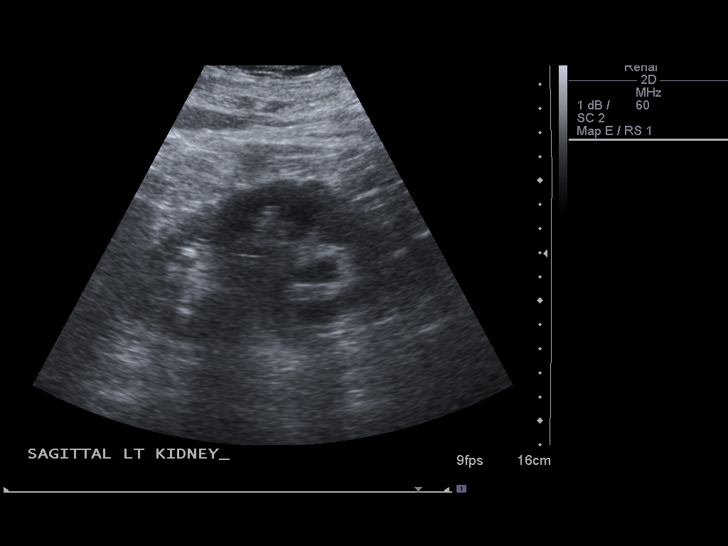
[im 59/59]
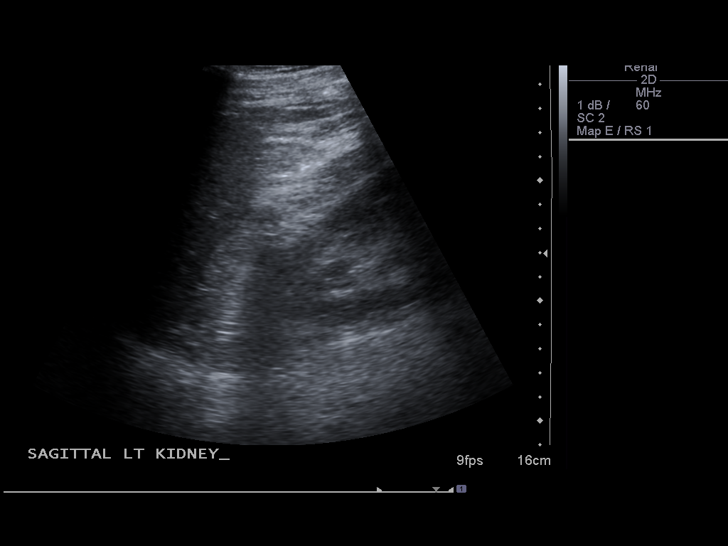

[14 of 25 positions shown; findings below may reference images not displayed]

FINDINGS: Right Kidney:  11.2 cm in length.  No hydronephrosis.  There are
several small cysts and one or more nonobstructing stones.

Left Kidney:  13.2 cm in length.  There is a probable duplex
collecting system.  There are several small cysts.  No
hydronephrosis or other significant findings.  There are several
small nonobstructing stones.

Bladder:  Foley catheter in place and so there is no appreciable
amount of urine in the bladder.
IMPRESSION: 1.  No hydronephrosis.
2.  Small bilateral cysts and bilateral nonobstructing stones.

## 2012-04-09 DIAGNOSIS — I6529 Occlusion and stenosis of unspecified carotid artery: Secondary | ICD-10-CM | POA: Diagnosis not present

## 2012-04-16 DIAGNOSIS — Z951 Presence of aortocoronary bypass graft: Secondary | ICD-10-CM | POA: Diagnosis not present

## 2012-04-16 DIAGNOSIS — I2510 Atherosclerotic heart disease of native coronary artery without angina pectoris: Secondary | ICD-10-CM | POA: Diagnosis not present

## 2012-05-15 ENCOUNTER — Encounter: Payer: Self-pay | Admitting: Emergency Medicine

## 2012-05-15 ENCOUNTER — Ambulatory Visit (INDEPENDENT_AMBULATORY_CARE_PROVIDER_SITE_OTHER): Payer: Medicare Other | Admitting: Emergency Medicine

## 2012-05-15 VITALS — BP 138/78 | HR 83 | Temp 98.4°F | Ht 70.5 in | Wt 171.8 lb

## 2012-05-15 DIAGNOSIS — J449 Chronic obstructive pulmonary disease, unspecified: Secondary | ICD-10-CM

## 2012-05-15 DIAGNOSIS — J309 Allergic rhinitis, unspecified: Secondary | ICD-10-CM

## 2012-05-15 MED ORDER — LORATADINE 10 MG PO TABS: 10.0000 mg | ORAL_TABLET | Freq: Every day | ORAL | Status: DC

## 2012-05-15 MED ORDER — DOXYCYCLINE HYCLATE 100 MG PO TABS: 100.0000 mg | ORAL_TABLET | Freq: Two times a day (BID) | ORAL | Status: AC

## 2012-05-15 NOTE — Progress Notes (Signed)
  78 yo man with COPD, CAD/CABG (06) followed by Dr Tresa Endo.   01/12/09 - was restarted on Symbicort 160/4.5 two times a day by Dr Sherene Sires last time. Feels his breathing is better, not using ProAir frequently. Also on Spiriva once a day.   06/01/09 -- Regular follow up visit. Has been doing well since last visit. No exertional dyspnea, able to participate in [employer]. No wheezing, no coughing.   ROV 11/06/09 -- Returns today for regular follow up. Has been doing well, takes Spiriva + Symbicort. Stays very active. Hasn't required ProAir at all. Continues to follow with Dr Tresa Endo - he is due for a stress test and follow up. Has been having more bruising when he bumps his hands.   ROV 05/21/10 -- returns for f/u COPD. last time we changed Symbicort to 80/4.5 two times a day. Also on Spiriva. Still having SOB with exertion, like working in the shed, the yard. He has not had any exacerbations. Family is concerned that he may have lost some ground since last visit in exertional tolerance. Still competes in the [employer].   ROV 08/09/11 -- COPD, maintained on Spiriva + Symbicort. Since our last visit he has been admitted for C diff and subsequent ureteral obstruction with renal failure (12/11 - 1/12). Went to [facility], then to [facility] for PT. He tells me that he stopped his Spiriva and Symbicort from March to June, now back on them. His breathing is better since restarting, but he is not near baseline. No real wheeze,  No cough.   ROV 02/11/12 -- COPD on Spiriva + Symbicort (restarted just prior to last visit). Also followed for MGUS by Dr Monika Salk.  He continues to improve post-hospitalization. Does get some dyspnea w heavy exertion. Continues to do yard work and to bowl.  He thinks breathing is about the same. No flares, no hospitalizations.   ROV 05/15/12 -- COPD on Spiriva + Symbicort. Has been well until about 1.5 weeks ago when he developed nasal congestion and drainage, cough, tickle in throat, worst in evening.  No wheezing or dyspnea.    Filed Vitals:   05/15/12 1629  BP: 138/78  Pulse: 83  Temp: 98.4 F (36.9 C)   Gen: Pleasant, well-nourished, in no distress,  normal affect  ENT: No lesions,  mouth clear,  oropharynx clear, no postnasal drip  Neck: No JVD, no TMG, no carotid bruits  Lungs: No use of accessory muscles, distant, no wheeze or crackles.   Cardiovascular: RRR, heart sounds normal, no murmur or gallops, no peripheral edema  Musculoskeletal: No deformities, no cyanosis or clubbing  Neuro: alert, non focal  Skin: Warm, no lesions or rashes  ALLERGIC RHINITIS Start loratadine and nasonex  Bronchitis with airway obstruction No clear bronchitis at this time, but he is at risk to develop. Will give script for doxy to fill if he shows signs of bronchitis. Reviewed these with him.   C O P D Continue same BD's

## 2012-05-15 NOTE — Assessment & Plan Note (Signed)
Continue same BD's  

## 2012-05-15 NOTE — Patient Instructions (Signed)
Please continue your inhaled medications as you are taking them Start using loratadine 10mg  daily and Nasonex 2 sprays each nostril daily Start taking doxycycline if you develop increased cough, increased mucous production, a change in the color of your mucous, fevers, chills or any change in your breathing.  Follow with Tammy Parrett in 3-4 weeks to insure that you are improved.

## 2012-05-15 NOTE — Assessment & Plan Note (Signed)
No clear bronchitis at this time, but he is at risk to develop. Will give script for doxy to fill if he shows signs of bronchitis. Reviewed these with him.

## 2012-05-15 NOTE — Assessment & Plan Note (Signed)
Start loratadine and nasonex

## 2012-06-02 DIAGNOSIS — I2510 Atherosclerotic heart disease of native coronary artery without angina pectoris: Secondary | ICD-10-CM | POA: Diagnosis not present

## 2012-06-02 DIAGNOSIS — Z951 Presence of aortocoronary bypass graft: Secondary | ICD-10-CM | POA: Diagnosis not present

## 2012-06-02 DIAGNOSIS — I10 Essential (primary) hypertension: Secondary | ICD-10-CM | POA: Diagnosis not present

## 2012-06-02 DIAGNOSIS — I739 Peripheral vascular disease, unspecified: Secondary | ICD-10-CM | POA: Diagnosis not present

## 2012-06-03 ENCOUNTER — Other Ambulatory Visit (HOSPITAL_BASED_OUTPATIENT_CLINIC_OR_DEPARTMENT_OTHER): Payer: Medicare Other | Admitting: Lab

## 2012-06-03 ENCOUNTER — Ambulatory Visit (HOSPITAL_BASED_OUTPATIENT_CLINIC_OR_DEPARTMENT_OTHER): Payer: Medicare Other | Admitting: Hematology & Oncology

## 2012-06-03 VITALS — BP 124/73 | HR 51 | Temp 97.6°F | Ht 69.0 in | Wt 172.0 lb

## 2012-06-03 DIAGNOSIS — D472 Monoclonal gammopathy: Secondary | ICD-10-CM

## 2012-06-03 DIAGNOSIS — D509 Iron deficiency anemia, unspecified: Secondary | ICD-10-CM | POA: Diagnosis not present

## 2012-06-03 DIAGNOSIS — N289 Disorder of kidney and ureter, unspecified: Secondary | ICD-10-CM | POA: Diagnosis not present

## 2012-06-03 DIAGNOSIS — D649 Anemia, unspecified: Secondary | ICD-10-CM | POA: Diagnosis not present

## 2012-06-03 DIAGNOSIS — N189 Chronic kidney disease, unspecified: Secondary | ICD-10-CM

## 2012-06-03 LAB — CBC WITH DIFFERENTIAL (CANCER CENTER ONLY)
BASO#: 0.1 10*3/uL (ref 0.0–0.2)
BASO%: 1 % (ref 0.0–2.0)
EOS%: 2.9 % (ref 0.0–7.0)
HCT: 37 % — ABNORMAL LOW (ref 38.7–49.9)
LYMPH%: 22.1 % (ref 14.0–48.0)
MCHC: 33.5 g/dL (ref 32.0–35.9)
MCV: 97 fL (ref 82–98)
NEUT%: 65.7 % (ref 40.0–80.0)
RDW: 12.4 % (ref 11.1–15.7)

## 2012-06-03 NOTE — Progress Notes (Signed)
This office note has been dictated.

## 2012-06-04 NOTE — Progress Notes (Signed)
CC:   Nicki Guadalajara, M.D. Mindi Slicker. Lowell Guitar, M.D. Burnell Blanks, MD  DIAGNOSES: 1. 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 2. Anemia secondary to renal insufficiency/iron-deficiency anemia.  CURRENT THERAPY:  Observation.  INTERIM HISTORY:  Mr. Marczak comes in for his followup.  We saw him back in March.  When we saw him, his lab studies showed an IgG level of 1020 mg per dL.  His serum kappa light chain was 6.79 mg per dL.  His ferritin was 142 with an iron saturation of 24%.  He is doing real well.  He is participating in the [employer].  He won about [employer], he says.  He is having no problems with fatigue or weakness.  He has had a little bit of a cough.  This may have been from the pollen.  He has had no problems with fever.  He has had no bony pain.  He has had no change in bowel or bladder habits.  He has not noticed any problems with headache or blurred vision.  Of note, when we did last see him, his monoclonal spike was 0.44 g per dL.  This is holding stable.  PHYSICAL EXAMINATION:  This is an elderly white gentleman in no obvious distress.  Vital signs:  97.6, pulse 51, respiratory rate 20, blood pressure 124/73.  Weight is 172.  Head and neck:  A normocephalic, atraumatic skull.  There are no ocular or oral lesions.  There are no palpable cervical or supraclavicular lymph nodes.  Lungs:  Clear bilaterally.  Cardiac:  Regular rate and rhythm with a normal S1 and S2. There are no murmurs, rubs or bruits.  Abdomen:  Soft with good bowel sounds.  There is no palpable abdominal mass.  There is no fluid wave. There is no palpable hepatosplenomegaly.  Extremities:  No clubbing, cyanosis or edema.  Neurologic:  No focal neurological deficits.  LABORATORY STUDIES:  White cell count is 5.2, hemoglobin 12.4, hematocrit 37, platelet count 150.  IMPRESSION:  Mr. Devera is an 78 year old gentleman with an IgG lambda monoclonal gammopathy of  undetermined significance.  This really is not an issue from my point of view.  He is not anemic.  He has no problems with his kidney function from a worsening point of view.  I think we can probably get him back now in 4 months.  I do not see that we need any blood work in between visits.    ______________________________ Josph Macho, M.D. PRE/MEDQ  D:  06/03/2012  T:  06/04/2012  Job:  [id number]   ADDENEDUM:  M-Spike is 0.42 g/dL.  IgG is 1070 mg/dL. LAMBDA is 5.41 mg/dL.               Ferritin is 152.

## 2012-06-05 LAB — PROTEIN ELECTROPHORESIS, SERUM, WITH REFLEX
Albumin ELP: 56.7 % (ref 55.8–66.1)
Alpha-1-Globulin: 4.5 % (ref 2.9–4.9)
Gamma Globulin: 16.5 % (ref 11.1–18.8)

## 2012-06-05 LAB — KAPPA/LAMBDA LIGHT CHAINS
Kappa free light chain: 7.72 mg/dL — ABNORMAL HIGH (ref 0.33–1.94)
Kappa:Lambda Ratio: 1.43 (ref 0.26–1.65)
Lambda Free Lght Chn: 5.41 mg/dL — ABNORMAL HIGH (ref 0.57–2.63)

## 2012-06-05 LAB — IGG, IGA, IGM
IgA: 168 mg/dL (ref 68–379)
IgM, Serum: 281 mg/dL — ABNORMAL HIGH (ref 41–251)

## 2012-06-05 LAB — IRON AND TIBC: TIBC: 277 ug/dL (ref 215–435)

## 2012-06-09 ENCOUNTER — Encounter: Payer: Self-pay | Admitting: Adult Health

## 2012-06-09 ENCOUNTER — Ambulatory Visit (INDEPENDENT_AMBULATORY_CARE_PROVIDER_SITE_OTHER): Payer: Medicare Other | Admitting: Adult Health

## 2012-06-09 VITALS — BP 132/78 | HR 81 | Temp 98.0°F | Ht 71.0 in | Wt 172.6 lb

## 2012-06-09 DIAGNOSIS — I2510 Atherosclerotic heart disease of native coronary artery without angina pectoris: Secondary | ICD-10-CM | POA: Diagnosis not present

## 2012-06-09 DIAGNOSIS — J309 Allergic rhinitis, unspecified: Secondary | ICD-10-CM

## 2012-06-09 DIAGNOSIS — J449 Chronic obstructive pulmonary disease, unspecified: Secondary | ICD-10-CM | POA: Diagnosis not present

## 2012-06-09 DIAGNOSIS — Z79899 Other long term (current) drug therapy: Secondary | ICD-10-CM | POA: Diagnosis not present

## 2012-06-09 DIAGNOSIS — E782 Mixed hyperlipidemia: Secondary | ICD-10-CM | POA: Diagnosis not present

## 2012-06-09 MED ORDER — MOMETASONE FUROATE 50 MCG/ACT NA SUSP: 2.0000 | Freq: Every day | NASAL | Status: DC

## 2012-06-09 MED ORDER — LORATADINE 10 MG PO TABS: 10.0000 mg | ORAL_TABLET | Freq: Every day | ORAL | Status: DC

## 2012-06-09 NOTE — Progress Notes (Signed)
78 yo man with COPD, CAD/CABG (06) followed by Dr Tresa Endo.   01/12/09 - was restarted on Symbicort 160/4.5 two times a day by Dr Sherene Sires last time. Feels his breathing is better, not using ProAir frequently. Also on Spiriva once a day.   06/01/09 -- Regular follow up visit. Has been doing well since last visit. No exertional dyspnea, able to participate in [employer]. No wheezing, no coughing.   ROV 11/06/09 -- Returns today for regular follow up. Has been doing well, takes Spiriva + Symbicort. Stays very active. Hasn't required ProAir at all. Continues to follow with Dr Tresa Endo - he is due for a stress test and follow up. Has been having more bruising when he bumps his hands.   ROV 05/21/10 -- returns for f/u COPD. last time we changed Symbicort to 80/4.5 two times a day. Also on Spiriva. Still having SOB with exertion, like working in the shed, the yard. He has not had any exacerbations. Family is concerned that he may have lost some ground since last visit in exertional tolerance. Still competes in the [employer].   ROV 08/09/11 -- COPD, maintained on Spiriva + Symbicort. Since our last visit he has been admitted for C diff and subsequent ureteral obstruction with renal failure (12/11 - 1/12). Went to [facility], then to [facility] for PT. He tells me that he stopped his Spiriva and Symbicort from March to June, now back on them. His breathing is better since restarting, but he is not near baseline. No real wheeze,  No cough.   ROV 02/11/12 -- COPD on Spiriva + Symbicort (restarted just prior to last visit). Also followed for MGUS by Dr Monika Salk.  He continues to improve post-hospitalization. Does get some dyspnea w heavy exertion. Continues to do yard work and to bowl.  He thinks breathing is about the same. No flares, no hospitalizations.   ROV 05/15/12 -- COPD on Spiriva + Symbicort. Has been well until about 1.5 weeks ago when he developed nasal congestion and drainage, cough, tickle in throat, worst in evening. No  wheezing or dyspnea.    06/09/2012 Follow up  Returns for 3 week follow up . Last ov with cough/AR flare.  Tx w/ claritin and Nasonex.  Cough and drainage improved greatly. No tickle in throat.  No hemoptysis or chest pain .  No fever .    ROS:  Constitutional:   No  weight loss, night sweats,  Fevers, chills, fatigue, or  lassitude.  HEENT:   No headaches,  Difficulty swallowing,  Tooth/dental problems, or  Sore throat,                No sneezing, itching, ear ache, nasal congestion, post nasal drip,   CV:  No chest pain,  Orthopnea, PND, swelling in lower extremities, anasarca, dizziness, palpitations, syncope.   GI  No heartburn, indigestion, abdominal pain, nausea, vomiting, diarrhea, change in bowel habits, loss of appetite, bloody stools.   Resp:  .  No excess mucus, no productive cough,  No non-productive cough,  No coughing up of blood.  No change in color of mucus.  No wheezing.  No chest wall deformity  Skin: no rash or lesions.  GU: no dysuria, change in color of urine, no urgency or frequency.  No flank pain, no hematuria   MS:  No joint pain or swelling.  No decreased range of motion.  No back pain.  Psych:  No change in mood or affect. No depression or anxiety.  No memory loss.      EXAM :  Gen: Pleasant,  in no distress,  normal affect  ENT: No lesions,  mouth clear,  oropharynx clear, no postnasal drip  Neck: No JVD, no TMG, no carotid bruits  Lungs: No use of accessory muscles, distant, no wheeze or crackles. , diminshed BS in bases   Cardiovascular: RRR, heart sounds normal, no murmur or gallops, no peripheral edema  Musculoskeletal: No deformities, no cyanosis or clubbing  Neuro: alert, non focal  Skin: Warm, no lesions or rashes

## 2012-06-09 NOTE — Assessment & Plan Note (Signed)
Compensated on present regimen.   

## 2012-06-09 NOTE — Patient Instructions (Signed)
Keep up good work  Continue on same regimen  Brush/rinse/gargle after inhaler use.  follow up Dr. Delton Coombes  In 3 months and As needed

## 2012-06-09 NOTE — Assessment & Plan Note (Signed)
Improved with Claritin and Nasonex

## 2012-06-10 DIAGNOSIS — E782 Mixed hyperlipidemia: Secondary | ICD-10-CM | POA: Diagnosis not present

## 2012-06-10 DIAGNOSIS — I2510 Atherosclerotic heart disease of native coronary artery without angina pectoris: Secondary | ICD-10-CM | POA: Diagnosis not present

## 2012-06-11 ENCOUNTER — Ambulatory Visit: Payer: Self-pay | Admitting: Adult Health

## 2012-06-23 ENCOUNTER — Telehealth: Payer: Self-pay | Admitting: Hematology & Oncology

## 2012-06-23 NOTE — Telephone Encounter (Addendum)
Message copied by Cathi Roan on Tue Jun 23, 2012  2:00 PM ------      Message from: Arlan Organ R      Created: Wed Jun 10, 2012  6:49 PM       Please call his daughter and let her know that his lab work looks stable with no changes that are troublesome. Cindee Lame  [phone]  1:50pm   Called patients daughter Chyrl Civatte @ [phone] and left message regarding above MD note, also gave phone # if daughter wanted to call  Back. Alonna Minium LPN

## 2012-09-03 ENCOUNTER — Telehealth: Payer: Self-pay | Admitting: Emergency Medicine

## 2012-09-03 ENCOUNTER — Other Ambulatory Visit: Payer: Self-pay | Admitting: Emergency Medicine

## 2012-09-03 MED ORDER — TIOTROPIUM BROMIDE MONOHYDRATE 18 MCG IN CAPS: 18.0000 ug | ORAL_CAPSULE | Freq: Every day | RESPIRATORY_TRACT | Status: DC

## 2012-09-03 NOTE — Telephone Encounter (Signed)
I spoke with daughter and is aware rx has been sent. Nothing further was needed

## 2012-09-10 ENCOUNTER — Ambulatory Visit (INDEPENDENT_AMBULATORY_CARE_PROVIDER_SITE_OTHER): Payer: Medicare Other | Admitting: Emergency Medicine

## 2012-09-10 ENCOUNTER — Encounter: Payer: Self-pay | Admitting: Emergency Medicine

## 2012-09-10 VITALS — BP 130/62 | HR 83 | Temp 98.3°F | Ht 70.0 in | Wt 175.4 lb

## 2012-09-10 DIAGNOSIS — J449 Chronic obstructive pulmonary disease, unspecified: Secondary | ICD-10-CM

## 2012-09-10 DIAGNOSIS — J309 Allergic rhinitis, unspecified: Secondary | ICD-10-CM | POA: Diagnosis not present

## 2012-09-10 DIAGNOSIS — Z23 Encounter for immunization: Secondary | ICD-10-CM

## 2012-09-10 NOTE — Assessment & Plan Note (Signed)
Continue spiriva + symbicort SABA prn Flu shot today rov 6

## 2012-09-10 NOTE — Assessment & Plan Note (Signed)
continue loratadine  Could restart fluticasone if allergies flare or in the Spring

## 2012-09-10 NOTE — Patient Instructions (Addendum)
Continue your Spiriva and Symbicort Use albuterol if needed Continue your loratadine daily.  If your allergies worsen, call our off and we can give you a generic version of nasonex spray Follow with Dr Delton Coombes in 6 months or sooner if you have any problems

## 2012-09-10 NOTE — Progress Notes (Signed)
78 yo man with COPD, CAD/CABG (06) followed by Dr Tresa Endo.   01/12/09 - was restarted on Symbicort 160/4.5 two times a day by Dr Sherene Sires last time. Feels his breathing is better, not using ProAir frequently. Also on Spiriva once a day.   06/01/09 -- Regular follow up visit. Has been doing well since last visit. No exertional dyspnea, able to participate in [employer]. No wheezing, no coughing.   ROV 11/06/09 -- Returns today for regular follow up. Has been doing well, takes Spiriva + Symbicort. Stays very active. Hasn't required ProAir at all. Continues to follow with Dr Tresa Endo - he is due for a stress test and follow up. Has been having more bruising when he bumps his hands.   ROV 05/21/10 -- returns for f/u COPD. last time we changed Symbicort to 80/4.5 two times a day. Also on Spiriva. Still having SOB with exertion, like working in the shed, the yard. He has not had any exacerbations. Family is concerned that he may have lost some ground since last visit in exertional tolerance. Still competes in the [employer].   ROV 08/09/11 -- COPD, maintained on Spiriva + Symbicort. Since our last visit he has been admitted for C diff and subsequent ureteral obstruction with renal failure (12/11 - 1/12). Went to [facility], then to [facility] for PT. He tells me that he stopped his Spiriva and Symbicort from March to June, now back on them. His breathing is better since restarting, but he is not near baseline. No real wheeze,  No cough.   ROV 02/11/12 -- COPD on Spiriva + Symbicort (restarted just prior to last visit). Also followed for MGUS by Dr Monika Salk.  He continues to improve post-hospitalization. Does get some dyspnea w heavy exertion. Continues to do yard work and to bowl.  He thinks breathing is about the same. No flares, no hospitalizations.   ROV 05/15/12 -- COPD on Spiriva + Symbicort. Has been well until about 1.5 weeks ago when he developed nasal congestion and drainage, cough, tickle in throat, worst in evening. No  wheezing or dyspnea.    Follow up 06/09/12 --  Returns for 3 week follow up . Last ov with cough/AR flare.  Tx w/ claritin and Nasonex.  Cough and drainage improved greatly. No tickle in throat.  No hemoptysis or chest pain .  No fever .   ROV 09/10/12 -- COPD, allergic rhinitis and associated cough. Not on nasonex right now. He is on loratadine, spiriva + symbicort. Doing well. Last AE was this Spring. Needs the flu shot.     EXAM :  Filed Vitals:   09/10/12 1331  BP: 130/62  Pulse: 83  Temp: 98.3 F (36.8 C)   Gen: Pleasant,  in no distress,  normal affect  ENT: No lesions,  mouth clear,  oropharynx clear, no postnasal drip  Neck: No JVD, no TMG, no carotid bruits  Lungs: No use of accessory muscles, distant, no wheeze or crackles. , diminshed BS in bases   Cardiovascular: RRR, heart sounds normal, no murmur or gallops, no peripheral edema  Musculoskeletal: No deformities, no cyanosis or clubbing  Neuro: alert, non focal  Skin: Warm, no lesions or rashes  C O P D Continue spiriva + symbicort SABA prn Flu shot today rov 6  ALLERGIC RHINITIS continue loratadine  Could restart fluticasone if allergies flare or in the Spring

## 2012-09-16 ENCOUNTER — Other Ambulatory Visit: Payer: Self-pay | Admitting: Emergency Medicine

## 2012-09-16 ENCOUNTER — Telehealth: Payer: Self-pay | Admitting: Emergency Medicine

## 2012-09-16 MED ORDER — BUDESONIDE-FORMOTEROL FUMARATE 80-4.5 MCG/ACT IN AERO: 2.0000 | INHALATION_SPRAY | Freq: Two times a day (BID) | RESPIRATORY_TRACT | Status: DC

## 2012-09-16 NOTE — Telephone Encounter (Signed)
lmomtcb x1--rx has been sent 

## 2012-09-16 NOTE — Telephone Encounter (Signed)
Daughter aware rx has been sent

## 2012-09-29 ENCOUNTER — Ambulatory Visit (HOSPITAL_BASED_OUTPATIENT_CLINIC_OR_DEPARTMENT_OTHER): Payer: Medicare Other | Admitting: Hematology & Oncology

## 2012-09-29 ENCOUNTER — Other Ambulatory Visit (HOSPITAL_BASED_OUTPATIENT_CLINIC_OR_DEPARTMENT_OTHER): Payer: Medicare Other | Admitting: Lab

## 2012-09-29 VITALS — BP 151/67 | HR 83 | Temp 97.9°F | Resp 20 | Ht 70.0 in | Wt 174.0 lb

## 2012-09-29 DIAGNOSIS — D472 Monoclonal gammopathy: Secondary | ICD-10-CM

## 2012-09-29 DIAGNOSIS — D649 Anemia, unspecified: Secondary | ICD-10-CM

## 2012-09-29 DIAGNOSIS — N289 Disorder of kidney and ureter, unspecified: Secondary | ICD-10-CM

## 2012-09-29 DIAGNOSIS — D509 Iron deficiency anemia, unspecified: Secondary | ICD-10-CM | POA: Diagnosis not present

## 2012-09-29 DIAGNOSIS — D631 Anemia in chronic kidney disease: Secondary | ICD-10-CM

## 2012-09-29 LAB — CBC WITH DIFFERENTIAL (CANCER CENTER ONLY)
BASO#: 0 10*3/uL (ref 0.0–0.2)
EOS%: 3.3 % (ref 0.0–7.0)
HCT: 37.5 % — ABNORMAL LOW (ref 38.7–49.9)
HGB: 12.4 g/dL — ABNORMAL LOW (ref 13.0–17.1)
LYMPH%: 30.9 % (ref 14.0–48.0)
MCH: 32 pg (ref 28.0–33.4)
MCHC: 33.1 g/dL (ref 32.0–35.9)
MCV: 97 fL (ref 82–98)
MONO%: 9.5 % (ref 0.0–13.0)
NEUT%: 55.8 % (ref 40.0–80.0)

## 2012-09-29 NOTE — Progress Notes (Signed)
CC:   Burnell Blanks, MD Nicki Guadalajara, M.D. Mindi Slicker. Lowell Guitar, M.D.  DIAGNOSES: 1. IgG lambda MGUS (monoclonal gammopathy of undetermined     significance). 2. Anemia secondary to renal insufficiency. 3. Intermittent iron deficiency anemia.  CURRENT THERAPY:  Observation.  INTERIM HISTORY:  Mr. Matsumura comes in for followup.  We saw him back in June.  Back then, his monoclonal studies showed a stable monoclonal spike of 0.42 g/dL.  His IgG level was 1070 mg/dL.  His lambda light chain was 5.41 mg/dL.  In addition, we looked at iron studies.  Ferritin was 152 with iron saturation of 27%.  He has been doing well.  He has been participating in senior games.  He has won quite a Research scientist (life sciences).  He has been physically active.  He feels great.  He has had no change in medications.  He has had no problems with cough or shortness breath.  He has had no change in bowel or bladder habits. He has had no leg swelling.  There have been no rashes.  PHYSICAL EXAMINATION:  General:  This is a well-developed, well- nourished white gentleman in no obvious distress.  Vital signs:  97.9, pulse 83, respiratory rate 18, blood pressure 151/67.  Weight is 174. Head and neck:  Normocephalic, atraumatic skull.  There are no ocular or oral lesions.  There are no palpable cervical or supraclavicular lymph nodes.  Lungs:  Clear bilaterally.  Cardiac:  Regular rate and rhythm with normal S1, S2.  There are no murmurs, rubs or bruits.  Abdomen: Soft with good bowel sounds.  There is no palpable abdominal mass. There is no fluid wave.  There is no palpable hepatosplenomegaly.  Back: Shows no tenderness over the spine, ribs or hips.  Extremities:  Shows no clubbing, cyanosis or edema.  Skin:  No rashes, ecchymoses or petechiae.  LABORATORY STUDIES:  White cell count 3.7, hemoglobin 12.4, hematocrit 37.5, platelet count is 147.  IMPRESSION:  [name] is an 78 year old gentleman with an IgG lambda MGUS  (monoclonal gammopathy of undetermined significance).  He does have some mild renal insufficiency.  Because of this he has some mild anemia.  This really has not been a problem for him.  I think we can probably get him back in 6 months' time now.  I do not think we need to do any blood work in-between visits.    ______________________________ Josph Macho, M.D. PRE/MEDQ  D:  09/29/2012  T:  09/29/2012  Job:  [id number]

## 2012-09-29 NOTE — Progress Notes (Signed)
This office note has been dictated.

## 2012-10-01 ENCOUNTER — Telehealth: Payer: Self-pay | Admitting: *Deleted

## 2012-10-01 LAB — PROTEIN ELECTROPHORESIS, SERUM, WITH REFLEX
Albumin ELP: 56.4 % (ref 55.8–66.1)
Alpha-1-Globulin: 4.3 % (ref 2.9–4.9)
Alpha-2-Globulin: 11.7 % (ref 7.1–11.8)
Beta 2: 5.5 % (ref 3.2–6.5)
Beta Globulin: 5.5 % (ref 4.7–7.2)
Gamma Globulin: 16.6 % (ref 11.1–18.8)
M-Spike, %: 0.45 g/dL
Total Protein, Serum Electrophoresis: 6.8 g/dL (ref 6.0–8.3)

## 2012-10-01 LAB — KAPPA/LAMBDA LIGHT CHAINS: Kappa:Lambda Ratio: 1.73 — ABNORMAL HIGH (ref 0.26–1.65)

## 2012-10-01 LAB — COMPREHENSIVE METABOLIC PANEL
ALT: 40 U/L (ref 0–53)
AST: 39 U/L — ABNORMAL HIGH (ref 0–37)
Albumin: 4.2 g/dL (ref 3.5–5.2)
Alkaline Phosphatase: 91 U/L (ref 39–117)
BUN: 22 mg/dL (ref 6–23)
CO2: 26 mEq/L (ref 19–32)
Calcium: 9.5 mg/dL (ref 8.4–10.5)
Chloride: 108 mEq/L (ref 96–112)
Creatinine, Ser: 1.41 mg/dL — ABNORMAL HIGH (ref 0.50–1.35)
Glucose, Bld: 98 mg/dL (ref 70–99)
Potassium: 4.7 mEq/L (ref 3.5–5.3)
Sodium: 141 mEq/L (ref 135–145)
Total Bilirubin: 1 mg/dL (ref 0.3–1.2)
Total Protein: 6.8 g/dL (ref 6.0–8.3)

## 2012-10-01 NOTE — Telephone Encounter (Signed)
Message copied by Anselm Jungling on Thu Oct 01, 2012  2:22 PM ------      Message from: Arlan Organ R      Created: Thu Oct 01, 2012  7:16 AM       Call his daughter and let her know that all lab work looks good with nothing that shows any suspicious findings. Thanks. Cindee Lame

## 2012-10-01 NOTE — Telephone Encounter (Signed)
Called patients daughter to let her know that all the labwork looks good with nothing that shows any suspicious finding.patient daughter very appreciative

## 2013-01-12 DIAGNOSIS — E782 Mixed hyperlipidemia: Secondary | ICD-10-CM | POA: Diagnosis not present

## 2013-01-12 DIAGNOSIS — J449 Chronic obstructive pulmonary disease, unspecified: Secondary | ICD-10-CM | POA: Diagnosis not present

## 2013-01-12 DIAGNOSIS — I2510 Atherosclerotic heart disease of native coronary artery without angina pectoris: Secondary | ICD-10-CM | POA: Diagnosis not present

## 2013-01-15 ENCOUNTER — Other Ambulatory Visit (HOSPITAL_COMMUNITY): Payer: Self-pay | Admitting: Cardiovascular Disease

## 2013-01-15 DIAGNOSIS — I2510 Atherosclerotic heart disease of native coronary artery without angina pectoris: Secondary | ICD-10-CM

## 2013-02-22 ENCOUNTER — Telehealth: Payer: Self-pay | Admitting: Emergency Medicine

## 2013-02-22 NOTE — Telephone Encounter (Signed)
Spoke with patients daughter, she states she received a phone call from our offce. I can not find anywhere where someone has call from our office. Patient last seen 08/2012 and to have 6 month follow up, so as I stated to patients daughter this could have possibly been the reason for the call to schedule appt. Appt has been set for Thurs 03/18/13 at 2:30pm. Nothing further needed at this time.

## 2013-03-01 ENCOUNTER — Other Ambulatory Visit: Payer: Self-pay | Admitting: Emergency Medicine

## 2013-03-18 ENCOUNTER — Ambulatory Visit (INDEPENDENT_AMBULATORY_CARE_PROVIDER_SITE_OTHER): Payer: Medicare Other | Admitting: Emergency Medicine

## 2013-03-18 ENCOUNTER — Encounter: Payer: Self-pay | Admitting: Emergency Medicine

## 2013-03-18 VITALS — BP 140/80 | HR 101 | Temp 98.6°F | Ht 70.0 in | Wt 178.0 lb

## 2013-03-18 DIAGNOSIS — J449 Chronic obstructive pulmonary disease, unspecified: Secondary | ICD-10-CM | POA: Diagnosis not present

## 2013-03-18 DIAGNOSIS — J309 Allergic rhinitis, unspecified: Secondary | ICD-10-CM

## 2013-03-18 NOTE — Progress Notes (Signed)
79 yo man with COPD, CAD/CABG (06) followed by Dr Tresa Endo.   ROV 09/10/12 -- COPD, allergic rhinitis and associated cough. Not on nasonex right now. He is on loratadine, spiriva + symbicort. Doing well. Last AE was this Spring. Needs the flu shot.   ROV 03/18/13 -- COPD, allergies, CAD, MGUS (Enniver). Has been doing well since last time. Never uses SABA. No exacerbations since last time.  He follows with Dr Tresa Endo for TTE 04/13/13.  On loratadine, allergies well control.     EXAM :  Filed Vitals:   03/18/13 1424  BP: 140/80  Pulse: 101  Temp: 98.6 F (37 C)   Gen: Pleasant,  in no distress,  normal affect  ENT: No lesions,  mouth clear,  oropharynx clear, no postnasal drip  Neck: No JVD, no TMG, no carotid bruits  Lungs: No use of accessory muscles, distant, no wheeze or crackles. , diminshed BS in bases   Cardiovascular: RRR, heart sounds normal, no murmur or gallops, no peripheral edema  Musculoskeletal: No deformities, no cyanosis or clubbing  Neuro: alert, non focal  Skin: Warm, no lesions or rashes  C O P D Appears to be quite stable, doing well.  - continue same regimen - follow in 6 mon or prn  ALLERGIC RHINITIS - continue loratadine

## 2013-03-18 NOTE — Assessment & Plan Note (Signed)
Appears to be quite stable, doing well.  - continue same regimen - follow in 6 mon or prn

## 2013-03-18 NOTE — Assessment & Plan Note (Signed)
-   continue loratadine

## 2013-03-18 NOTE — Patient Instructions (Addendum)
Please continue your medications as you have been taking them  Follow with Dr Evy Lutterman in 6 months or sooner if you have any problems  

## 2013-03-24 ENCOUNTER — Telehealth: Payer: Self-pay | Admitting: Hematology & Oncology

## 2013-03-24 NOTE — Telephone Encounter (Signed)
Pt moved 4-1 to 4-11

## 2013-03-30 ENCOUNTER — Ambulatory Visit: Payer: Self-pay | Admitting: Hematology & Oncology

## 2013-03-30 ENCOUNTER — Other Ambulatory Visit: Payer: Self-pay | Admitting: Lab

## 2013-04-01 ENCOUNTER — Other Ambulatory Visit: Payer: Self-pay | Admitting: Emergency Medicine

## 2013-04-05 ENCOUNTER — Telehealth: Payer: Self-pay | Admitting: Emergency Medicine

## 2013-04-05 MED ORDER — BUDESONIDE-FORMOTEROL FUMARATE 80-4.5 MCG/ACT IN AERO: 2.0000 | INHALATION_SPRAY | Freq: Two times a day (BID) | RESPIRATORY_TRACT | Status: DC

## 2013-04-05 NOTE — Telephone Encounter (Signed)
Refill sent and pt daughter is aware. Carron Curie, CMA

## 2013-04-09 ENCOUNTER — Ambulatory Visit (HOSPITAL_BASED_OUTPATIENT_CLINIC_OR_DEPARTMENT_OTHER): Payer: Medicare Other | Admitting: Lab

## 2013-04-09 ENCOUNTER — Ambulatory Visit (HOSPITAL_BASED_OUTPATIENT_CLINIC_OR_DEPARTMENT_OTHER): Payer: Medicare Other | Admitting: Hematology & Oncology

## 2013-04-09 VITALS — BP 134/64 | HR 76 | Temp 98.4°F | Resp 18 | Ht 70.0 in | Wt 174.0 lb

## 2013-04-09 DIAGNOSIS — D472 Monoclonal gammopathy: Secondary | ICD-10-CM | POA: Diagnosis not present

## 2013-04-09 DIAGNOSIS — D509 Iron deficiency anemia, unspecified: Secondary | ICD-10-CM

## 2013-04-09 DIAGNOSIS — D649 Anemia, unspecified: Secondary | ICD-10-CM

## 2013-04-09 DIAGNOSIS — N289 Disorder of kidney and ureter, unspecified: Secondary | ICD-10-CM

## 2013-04-09 DIAGNOSIS — N039 Chronic nephritic syndrome with unspecified morphologic changes: Secondary | ICD-10-CM | POA: Diagnosis not present

## 2013-04-09 DIAGNOSIS — D631 Anemia in chronic kidney disease: Secondary | ICD-10-CM | POA: Diagnosis not present

## 2013-04-09 DIAGNOSIS — N189 Chronic kidney disease, unspecified: Secondary | ICD-10-CM

## 2013-04-09 LAB — CBC WITH DIFFERENTIAL (CANCER CENTER ONLY)
EOS%: 1.6 % (ref 0.0–7.0)
LYMPH%: 25.7 % (ref 14.0–48.0)
MCH: 31.1 pg (ref 28.0–33.4)
MCHC: 33 g/dL (ref 32.0–35.9)
MONO%: 11.9 % (ref 0.0–13.0)
NEUT#: 2.3 10*3/uL (ref 1.5–6.5)
Platelets: 149 10*3/uL (ref 145–400)
RDW: 13.1 % (ref 11.1–15.7)

## 2013-04-09 NOTE — Progress Notes (Signed)
This office note has been dictated.

## 2013-04-10 NOTE — Progress Notes (Signed)
CC:   Lance Blanks, MD [name], M.D. [name]. [name], M.D.  DIAGNOSES: 1. 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 2. Anemia secondary to renal insufficiency. 3. Intermittent iron-deficiency anemia.  CURRENT THERAPY:  Observation.  INTERIM HISTORY:  [name] comes in for followup.  We basically see him every 6 months.  Since we last saw him in October, he has been doing well.  He has had no specific complaints.  He has had no fatigue or weakness.  There has been no bleeding or bruising.  His appetite has been good.  There has been no change in his medications.  When we last saw him, his monoclonal spike was 0.45 gm/dL.  IgG level was 1050 mg/dL.  His lambda light chain was 3.9 mg/dL.  He has had no leg swelling.  There have been no rashes.  He has had no fever, sweats or chills.  His iron studies have been okay.  PHYSICAL EXAMINATION:  General:  This is an elderly white gentleman in no obvious distress.  Vital signs:  Temperature of 97.4, pulse 76, respiratory rate 18, blood pressure 134/64.  Weight is 174.  Head and neck:  Normocephalic, atraumatic skull.  There are no ocular or oral lesions.  There are no palpable cervical or supraclavicular lymph nodes. Lungs:  Clear to percussion and auscultation bilaterally.  Cardiac: Regular rate and rhythm, with a normal S1, S2.  There are no murmurs, rubs or bruits.  Abdomen:  Soft with good bowel sounds.  There is no palpable abdominal mass.  There is no fluid wave.  There is no palpable hepatosplenomegaly.  Extremities:  Show no clubbing, cyanosis or edema. Neurological:  Shows no focal neurological deficits.  LABORATORY STUDIES:  White cell count 3.9, hemoglobin 11.6, hematocrit 35.1, platelet count 149,000.  MCV is 94.  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  So far, this has not been an issue for  [place].  He has been asymptomatic.  He has really not changed with respect to his anemia.  We are checking his monoclonal studies.  We are checking his iron studies.  We will plan to get him back in 6 more months.  We certainly can see him back sooner if necessary.    ______________________________ [name], M.D. PRE/MEDQ  D:  04/09/2013  T:  04/10/2013  Job:  [id number]

## 2013-04-12 ENCOUNTER — Telehealth: Payer: Self-pay | Admitting: Hematology & Oncology

## 2013-04-12 ENCOUNTER — Telehealth: Payer: Self-pay

## 2013-04-12 ENCOUNTER — Ambulatory Visit: Payer: Self-pay

## 2013-04-12 DIAGNOSIS — D509 Iron deficiency anemia, unspecified: Secondary | ICD-10-CM

## 2013-04-12 NOTE — Telephone Encounter (Signed)
Pt made 4-24 iron appointment

## 2013-04-12 NOTE — Telephone Encounter (Addendum)
Message copied by Leana Gamer on Mon Apr 12, 2013 11:06 AM ------      Message from: Josph Macho      Created: Sun Apr 11, 2013  7:54 PM       Call his dgtr -- His iron is lower.  He could benefit from Feraheme 1020mg  x 1 dose.  Please set up!! Cindee Lame -----Spoke with patient daughter Chyrl Civatte regarding her dad iron being low. And needed to make appointment for iron infusion. Sent message to Raiford Noble to call pt daughter to make appointment for 1 hour iron infusion.

## 2013-04-13 ENCOUNTER — Ambulatory Visit (HOSPITAL_COMMUNITY): Payer: Self-pay

## 2013-04-13 ENCOUNTER — Ambulatory Visit (HOSPITAL_COMMUNITY)
Admission: RE | Admit: 2013-04-13 | Discharge: 2013-04-13 | Disposition: A | Discharge status: Home / Self Care | Payer: Medicare Other | Source: Ambulatory Visit | Attending: Cardiovascular Disease | Admitting: Cardiovascular Disease

## 2013-04-13 ENCOUNTER — Telehealth: Payer: Self-pay | Admitting: Hematology & Oncology

## 2013-04-13 DIAGNOSIS — I2510 Atherosclerotic heart disease of native coronary artery without angina pectoris: Secondary | ICD-10-CM | POA: Diagnosis not present

## 2013-04-13 LAB — IGG, IGA, IGM
IgA: 162 mg/dL (ref 68–379)
IgG (Immunoglobin G), Serum: 1070 mg/dL (ref 650–1600)

## 2013-04-13 LAB — KAPPA/LAMBDA LIGHT CHAINS
Kappa free light chain: 6.92 mg/dL — ABNORMAL HIGH (ref 0.33–1.94)
Kappa:Lambda Ratio: 1.31 (ref 0.26–1.65)
Lambda Free Lght Chn: 5.29 mg/dL — ABNORMAL HIGH (ref 0.57–2.63)

## 2013-04-13 LAB — PROTEIN ELECTROPHORESIS, SERUM, WITH REFLEX
Albumin ELP: 55.6 % — ABNORMAL LOW (ref 55.8–66.1)
Beta 2: 4.2 % (ref 3.2–6.5)
M-Spike, %: 0.47 g/dL
Total Protein, Serum Electrophoresis: 6.5 g/dL (ref 6.0–8.3)

## 2013-04-13 NOTE — Telephone Encounter (Signed)
pof for 4/14 was sent to [facility] scheduling pool. Rick alerted.

## 2013-04-13 NOTE — Progress Notes (Signed)
[facility]   2D echo completed 04/13/2013.   Cindy Mckay Tegtmeyer, RDCS  

## 2013-04-22 ENCOUNTER — Ambulatory Visit (HOSPITAL_BASED_OUTPATIENT_CLINIC_OR_DEPARTMENT_OTHER): Payer: Medicare Other

## 2013-04-22 VITALS — BP 140/80 | HR 76 | Temp 98.0°F

## 2013-04-22 DIAGNOSIS — D509 Iron deficiency anemia, unspecified: Secondary | ICD-10-CM

## 2013-04-22 MED ORDER — SODIUM CHLORIDE 0.9 % IV SOLN
1020.0000 mg | Freq: Once | INTRAVENOUS | Status: AC
  Administered 2013-04-22: 1020 mg via INTRAVENOUS
  Filled 2013-04-22: qty 34

## 2013-04-22 MED ORDER — SODIUM CHLORIDE 0.9 % IV SOLN
Freq: Once | INTRAVENOUS | Status: AC
  Administered 2013-04-22: 13:00:00 via INTRAVENOUS

## 2013-04-22 NOTE — Patient Instructions (Addendum)
fFerumoxytol injection What is this medicine? FERUMOXYTOL is an iron complex. Iron is used to make healthy red blood cells, which carry oxygen and nutrients throughout the body. This medicine is used to treat iron deficiency anemia in people with chronic kidney disease. This medicine may be used for other purposes; ask your health care provider or pharmacist if you have questions. What should I tell my health care provider before I take this medicine? They need to know if you have any of these conditions: -anemia not caused by low iron levels -high levels of iron in the blood -magnetic resonance imaging (MRI) test scheduled -an unusual or allergic reaction to iron, other medicines, foods, dyes, or preservatives -pregnant or trying to get pregnant -breast-feeding How should I use this medicine? This medicine is for infusion into a vein. It is given by a health care professional in a hospital or clinic setting. Talk to your pediatrician regarding the use of this medicine in children. Special care may be needed. Overdosage: If you think you've taken too much of this medicine contact a poison control center or emergency room at once. Overdosage: If you think you have taken too much of this medicine contact a poison control center or emergency room at once. NOTE: This medicine is only for you. Do not share this medicine with others. What if I miss a dose? It is important not to miss your dose. Call your doctor or health care professional if you are unable to keep an appointment. What may interact with this medicine? This medicine may interact with the following medications: -other iron products This list may not describe all possible interactions. Give your health care provider a list of all the medicines, herbs, non-prescription drugs, or dietary supplements you use. Also tell them if you smoke, drink alcohol, or use illegal drugs. Some items may interact with your medicine. What should I watch  for while using this medicine? Visit your doctor or healthcare professional regularly. Tell your doctor or healthcare professional if your symptoms do not start to get better or if they get worse. You may need blood work done while you are taking this medicine. You may need to follow a special diet. Talk to your doctor. Foods that contain iron include: whole grains/cereals, dried fruits, beans, or peas, leafy green vegetables, and organ meats (liver, kidney). What side effects may I notice from receiving this medicine? Side effects that you should report to your doctor or health care professional as soon as possible: -allergic reactions like skin rash, itching or hives, swelling of the face, lips, or tongue -breathing problems -changes in blood pressure -feeling faint or lightheaded, falls -fever or chills -flushing, sweating, or hot feelings -swelling of the ankles or feet Side effects that usually do not require medical attention (Report these to your doctor or health care professional if they continue or are bothersome.): -diarrhea -headache -nausea, vomiting -stomach pain This list may not describe all possible side effects. Call your doctor for medical advice about side effects. You may report side effects to FDA at 1-800-FDA-1088. Where should I keep my medicine? This drug is given in a hospital or clinic and will not be stored at home. NOTE: This sheet is a summary. It may not cover all possible information. If you have questions about this medicine, talk to your doctor, pharmacist, or health care provider.  2013, Elsevier/Gold Standard. (09/07/2008 9:48:25 PM)

## 2013-05-05 DIAGNOSIS — I119 Hypertensive heart disease without heart failure: Secondary | ICD-10-CM | POA: Diagnosis not present

## 2013-05-05 DIAGNOSIS — E782 Mixed hyperlipidemia: Secondary | ICD-10-CM | POA: Diagnosis not present

## 2013-05-05 DIAGNOSIS — I2510 Atherosclerotic heart disease of native coronary artery without angina pectoris: Secondary | ICD-10-CM | POA: Diagnosis not present

## 2013-05-20 ENCOUNTER — Other Ambulatory Visit (HOSPITAL_COMMUNITY): Payer: Self-pay | Admitting: Cardiovascular Disease

## 2013-06-22 DIAGNOSIS — I2510 Atherosclerotic heart disease of native coronary artery without angina pectoris: Secondary | ICD-10-CM | POA: Diagnosis not present

## 2013-06-22 DIAGNOSIS — Z87891 Personal history of nicotine dependence: Secondary | ICD-10-CM | POA: Diagnosis not present

## 2013-06-22 DIAGNOSIS — E785 Hyperlipidemia, unspecified: Secondary | ICD-10-CM | POA: Diagnosis not present

## 2013-06-22 DIAGNOSIS — I10 Essential (primary) hypertension: Secondary | ICD-10-CM | POA: Diagnosis not present

## 2013-06-22 DIAGNOSIS — F411 Generalized anxiety disorder: Secondary | ICD-10-CM | POA: Diagnosis not present

## 2013-06-22 DIAGNOSIS — Z79899 Other long term (current) drug therapy: Secondary | ICD-10-CM | POA: Diagnosis not present

## 2013-06-22 DIAGNOSIS — M545 Low back pain: Secondary | ICD-10-CM | POA: Diagnosis not present

## 2013-06-22 DIAGNOSIS — J449 Chronic obstructive pulmonary disease, unspecified: Secondary | ICD-10-CM | POA: Diagnosis not present

## 2013-06-22 DIAGNOSIS — N400 Enlarged prostate without lower urinary tract symptoms: Secondary | ICD-10-CM | POA: Diagnosis not present

## 2013-06-22 DIAGNOSIS — Z8679 Personal history of other diseases of the circulatory system: Secondary | ICD-10-CM | POA: Diagnosis not present

## 2013-06-22 DIAGNOSIS — R404 Transient alteration of awareness: Secondary | ICD-10-CM | POA: Diagnosis not present

## 2013-06-22 DIAGNOSIS — R55 Syncope and collapse: Secondary | ICD-10-CM | POA: Diagnosis not present

## 2013-06-22 DIAGNOSIS — R296 Repeated falls: Secondary | ICD-10-CM | POA: Diagnosis not present

## 2013-06-22 DIAGNOSIS — N200 Calculus of kidney: Secondary | ICD-10-CM | POA: Diagnosis not present

## 2013-06-23 DIAGNOSIS — N400 Enlarged prostate without lower urinary tract symptoms: Secondary | ICD-10-CM | POA: Diagnosis not present

## 2013-06-23 DIAGNOSIS — I951 Orthostatic hypotension: Secondary | ICD-10-CM | POA: Diagnosis not present

## 2013-06-23 DIAGNOSIS — R55 Syncope and collapse: Secondary | ICD-10-CM | POA: Diagnosis not present

## 2013-06-23 DIAGNOSIS — E785 Hyperlipidemia, unspecified: Secondary | ICD-10-CM | POA: Diagnosis not present

## 2013-06-23 DIAGNOSIS — F411 Generalized anxiety disorder: Secondary | ICD-10-CM | POA: Diagnosis not present

## 2013-06-23 DIAGNOSIS — I2510 Atherosclerotic heart disease of native coronary artery without angina pectoris: Secondary | ICD-10-CM | POA: Diagnosis not present

## 2013-06-23 DIAGNOSIS — J449 Chronic obstructive pulmonary disease, unspecified: Secondary | ICD-10-CM | POA: Diagnosis not present

## 2013-06-23 DIAGNOSIS — I10 Essential (primary) hypertension: Secondary | ICD-10-CM | POA: Diagnosis not present

## 2013-06-23 DIAGNOSIS — N200 Calculus of kidney: Secondary | ICD-10-CM | POA: Diagnosis not present

## 2013-06-24 ENCOUNTER — Ambulatory Visit (INDEPENDENT_AMBULATORY_CARE_PROVIDER_SITE_OTHER): Payer: Medicare Other | Admitting: Physician Assistant

## 2013-06-24 ENCOUNTER — Encounter: Payer: Self-pay | Admitting: Physician Assistant

## 2013-06-24 VITALS — BP 120/60 | HR 72 | Ht 70.5 in | Wt 174.7 lb

## 2013-06-24 DIAGNOSIS — R55 Syncope and collapse: Secondary | ICD-10-CM

## 2013-06-24 NOTE — Progress Notes (Signed)
Date:  06/24/2013   ID:  [name], DOB [birth year], MRN [id number]  PCP:  Ailene Ravel, MD  Primary Cardiologist:  Tresa Endo     History of Present Illness: [name] is a 79 y.o. 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  He has a history of coronary artery disease and coronary artery bypass grafting x4 in December 2006 after he was found to have 90% left main stenosis as well as high-grade RCA stenosis. His history also includes COPD/asthma, history of syncope with positive tilt table test.,g lambda monoclonal gammopathy followed by Dr. Myna Hidalgo. Recent 2-D echocardiogram 04/13/2013 showed mild concentric LVH and normal systolic function ejection fraction of 55-60% grade 1 diastolic dysfunction. Mitral annular calcification with subvalvular cord structure calcification as well as mild MR. Right atrium was mildly dilated. The left ventricle is mildly dilated. Moderate tricuspid regurgitation with mild pulmonary hypertension.  Patient presents today in followup to a hospitalization at [facility]. He states heate lunch on Tuesday in his car at the bowling alley. When he finished he went into the bowling alley and we got to the other end the next thing he noticed he was looking up and saw a whole bunch of the EMS people taking care of him. He simply woke up he felt fine he was ready to bowl however he was taken to [facility] instead. He had a CT scan of his head and abdomen which apparently was normal although do not have the records at this time. A chest x-ray.  His had no recurrence of symptoms. The patient currently denies nausea, vomiting, fever, chest pain, shortness of breath, orthopnea, dizziness, PND, cough, congestion, abdominal pain, hematochezia, melena, lower extremity edema.  We checked his orthostatic vitals here in the office and it was normal.   Wt Readings from Last 3 Encounters:    06/24/13 174 lb 11.2 oz (79.243 kg)  04/09/13 174 lb (78.926 kg)  03/18/13 178 lb (80.74 kg)     Past Medical History  Diagnosis Date  . Coronary heart disease   . COPD (chronic obstructive pulmonary disease)   . MGUS (monoclonal gammopathy of unknown significance) 12/05/2011  . Anemia of renal disease 12/05/2011  . Anemia, iron deficiency 12/05/2011    Current Outpatient Prescriptions  Medication Sig Dispense Refill  . albuterol (PROAIR HFA) 108 (90 BASE) MCG/ACT inhaler Inhale 2 puffs into the lungs every 6 (six) hours as needed.      Marland Kitchen aspirin 81 MG tablet Take 81 mg by mouth daily.        Marland Kitchen atorvastatin (LIPITOR) 40 MG tablet TAKE 1 TABLET BY MOUTH EVERY DAY  30 tablet  11  . budesonide-formoterol (SYMBICORT) 80-4.5 MCG/ACT inhaler Inhale 2 puffs into the lungs 2 (two) times daily.  1 Inhaler  5  . diphenhydramine-acetaminophen (TYLENOL PM) 25-500 MG TABS Take 1 tablet by mouth at bedtime as needed.        . loratadine (CLARITIN) 10 MG tablet Take 10 mg by mouth daily.      . midodrine (PROAMATINE) 5 MG tablet One by mouth three times daily      . Multiple Vitamins-Minerals (CVS SPECTRAVITE SENIOR) TABS Take 1 tablet by mouth daily.        . niacin (NIASPAN) 500 MG CR tablet Take 500 mg by mouth daily.        . nitroGLYCERIN (NITROSTAT) 0.4 MG SL tablet Place  0.4 mg under the tongue every 5 (five) minutes as needed.        . Probiotic Product (CVS SENIOR PROBIOTIC PO) Once daily       . sertraline (ZOLOFT) 25 MG tablet Once daily      . SPIRIVA HANDIHALER 18 MCG inhalation capsule PLACE 1 CAPSULE (18 MCG TOTAL) INTO INHALER AND INHALE DAILY.  30 capsule  3  . Tamsulosin HCl (FLOMAX) 0.4 MG CAPS Take 0.4 mg by mouth daily.         No current facility-administered medications for this visit.    Allergies:   No Known Allergies  Social History:  The patient  reports that he quit smoking about 19 years ago. His smoking use included Cigarettes. He has a 47 pack-year smoking history.  He has never used smokeless tobacco. He reports that he does not use illicit drugs.   Family History  Problem Relation Age of Onset  . Tuberculosis Father   . Heart attack Father   . Heart disease Mother   . Heart attack Mother   . Heart disease Brother   . Emphysema Daughter     premature without tobacco exposure    ROS:  Please see the history of present illness.  All other systems reviewed and negative.   PHYSICAL EXAM: VS:  BP 120/60  Pulse 72  Ht 5' 10.5" (1.791 m)  Wt 174 lb 11.2 oz (79.243 kg)  BMI 24.7 kg/m2 Well nourished, well developed, in no acute distress HEENT: Pupils are equal round react to light accommodation extraocular movements are intact.  Neck: no JVDNo cervical lymphadenopathy. Cardiac: Regular rate and rhythm without murmurs rubs or gallops. Lungs:  clear to auscultation bilaterally, no wheezing, rhonchi or rales Abd: soft, nontender, positive bowel sounds all quadrants, no hepatosplenomegaly Ext: no lower extremity edema.  2+ radial and dorsalis pedis pulses. Skin: warm and dry Neuro:  Grossly normal    ASSESSMENT AND PLAN:  Problem List Items Addressed This Visit   Syncope - Primary     Patient be set up with a CardioNet monitor for 30 days.  He is not orthostatic. Also asked not to drive at least until Monday.      Relevant Orders      Cardiac event monitor

## 2013-06-24 NOTE — Assessment & Plan Note (Signed)
Patient be set up with a CardioNet monitor for 30 days.  He is not orthostatic. Also asked not to drive at least until Monday.

## 2013-06-24 NOTE — Patient Instructions (Addendum)
Followup Dr. Tresa Endo in one month.  No driving until Monday.  Your physician has recommended that you wear an event monitor. Event monitors are medical devices that record the heart's electrical activity. Doctors most often [place] these monitors to diagnose arrhythmias. Arrhythmias are problems with the speed or rhythm of the heartbeat. The monitor is a small, portable device. You can wear one while you do your normal daily activities. This is usually used to diagnose what is causing palpitations/syncope (passing out).  [id number]  ;[id number] cardionet monitor

## 2013-07-01 ENCOUNTER — Other Ambulatory Visit: Payer: Self-pay | Admitting: Emergency Medicine

## 2013-07-04 ENCOUNTER — Other Ambulatory Visit: Payer: Self-pay | Admitting: Emergency Medicine

## 2013-07-05 NOTE — Telephone Encounter (Signed)
Rx filled 07/01/13

## 2013-07-13 ENCOUNTER — Encounter: Payer: Self-pay | Admitting: Cardiovascular Disease

## 2013-07-14 DIAGNOSIS — N200 Calculus of kidney: Secondary | ICD-10-CM | POA: Diagnosis not present

## 2013-07-14 DIAGNOSIS — N401 Enlarged prostate with lower urinary tract symptoms: Secondary | ICD-10-CM | POA: Diagnosis not present

## 2013-08-04 ENCOUNTER — Encounter: Payer: Self-pay | Admitting: Cardiovascular Disease

## 2013-08-04 ENCOUNTER — Ambulatory Visit (INDEPENDENT_AMBULATORY_CARE_PROVIDER_SITE_OTHER): Payer: Medicare Other | Admitting: Cardiovascular Disease

## 2013-08-04 VITALS — BP 118/62 | HR 86 | Ht 70.0 in | Wt 171.0 lb

## 2013-08-04 DIAGNOSIS — R55 Syncope and collapse: Secondary | ICD-10-CM

## 2013-08-04 DIAGNOSIS — I471 Supraventricular tachycardia: Secondary | ICD-10-CM

## 2013-08-04 DIAGNOSIS — J438 Other emphysema: Secondary | ICD-10-CM

## 2013-08-04 DIAGNOSIS — I4949 Other premature depolarization: Secondary | ICD-10-CM | POA: Diagnosis not present

## 2013-08-04 DIAGNOSIS — J449 Chronic obstructive pulmonary disease, unspecified: Secondary | ICD-10-CM

## 2013-08-04 DIAGNOSIS — R5381 Other malaise: Secondary | ICD-10-CM | POA: Diagnosis not present

## 2013-08-04 DIAGNOSIS — I493 Ventricular premature depolarization: Secondary | ICD-10-CM

## 2013-08-04 DIAGNOSIS — I2510 Atherosclerotic heart disease of native coronary artery without angina pectoris: Secondary | ICD-10-CM

## 2013-08-04 DIAGNOSIS — R5383 Other fatigue: Secondary | ICD-10-CM | POA: Diagnosis not present

## 2013-08-04 LAB — COMPREHENSIVE METABOLIC PANEL
ALT: 34 U/L (ref 0–53)
AST: 31 U/L (ref 0–37)
Albumin: 3.9 g/dL (ref 3.5–5.2)
Alkaline Phosphatase: 112 U/L (ref 39–117)
BUN: 26 mg/dL — ABNORMAL HIGH (ref 6–23)
Calcium: 9.3 mg/dL (ref 8.4–10.5)
Chloride: 109 mEq/L (ref 96–112)
Potassium: 4.3 mEq/L (ref 3.5–5.3)
Sodium: 142 mEq/L (ref 135–145)

## 2013-08-04 LAB — MAGNESIUM: Magnesium: 1.7 mg/dL (ref 1.5–2.5)

## 2013-08-04 LAB — CBC
Hemoglobin: 11.7 g/dL — ABNORMAL LOW (ref 13.0–17.0)
MCH: 31.4 pg (ref 26.0–34.0)
MCHC: 33.9 g/dL (ref 30.0–36.0)
RDW: 13.9 % (ref 11.5–15.5)

## 2013-08-04 MED ORDER — METOPROLOL TARTRATE 25 MG PO TABS: 25.0000 mg | ORAL_TABLET | Freq: Two times a day (BID) | ORAL | Status: DC

## 2013-08-04 MED ORDER — MIDODRINE HCL 5 MG PO TABS: 5.0000 mg | ORAL_TABLET | Freq: Three times a day (TID) | ORAL | Status: DC

## 2013-08-04 NOTE — Patient Instructions (Signed)
Your physician has recommended you make the following change in your medication: START new prescription for Metoprolol 25 mg . This has already been sent to your pharmacy.  Your physician recommends that you return for lab work.  Your physician recommends that you schedule a follow-up appointment in: 4 WEEKS.

## 2013-08-15 ENCOUNTER — Encounter: Payer: Self-pay | Admitting: Cardiovascular Disease

## 2013-08-15 DIAGNOSIS — I4892 Unspecified atrial flutter: Secondary | ICD-10-CM | POA: Insufficient documentation

## 2013-08-15 NOTE — Progress Notes (Signed)
Patient ID: [name], male   DOB: [birth year], 79 y.o.   MRN: [id number]     HPI: [name], is a 79 y.o. male who presents for cardiology followup evaluation. I last saw him in May 2014.  [name] check has known coronary artery disease in December 2006 was found to have 90% left main stenosis as well as high grade RCA stenosis. He underwent CABG surgery x4 by [name] with the LIMA to the LAD, a vein to the distal circumflex, and sequential vein to the intermediate and obtuse marginal vessel. Additional problems include mixed hyperlipidemia, hypertension, COPD/asthma, a history of IgG lambda monoclonal mammography. He has remained fairly active. There also is a history of having a positive tilt table test for which he has done well with Midrin the apparently recently, he was seen by [name] the recently completed a CardioNet monitor which did show predominant sinus rhythm with PACs and PVCs but he did have several bursts of paroxysmal supraventricular tachycardia on multiple occasions with some rates noted to be as high as 150-180 beats per minute. He presents for evaluation.  Past Medical History  Diagnosis Date  . Coronary heart disease   . COPD (chronic obstructive pulmonary disease)   . MGUS (monoclonal gammopathy of unknown significance) 12/05/2011  . Anemia of renal disease 12/05/2011  . Anemia, iron deficiency 12/05/2011    History reviewed. No pertinent past surgical history.  No Known Allergies  Current Outpatient Prescriptions  Medication Sig Dispense Refill  . albuterol (PROAIR HFA) 108 (90 BASE) MCG/ACT inhaler Inhale 2 puffs into the lungs every 6 (six) hours as needed.      Marland Kitchen aspirin 81 MG tablet Take 81 mg by mouth daily.        Marland Kitchen atorvastatin (LIPITOR) 40 MG tablet TAKE 1 TABLET BY MOUTH EVERY DAY  30 tablet  11  . budesonide-formoterol (SYMBICORT) 80-4.5 MCG/ACT inhaler Inhale 2 puffs into the lungs 2 (two) times daily.  1 Inhaler  5  .  diphenhydramine-acetaminophen (TYLENOL PM) 25-500 MG TABS Take 1 tablet by mouth at bedtime as needed.        . loratadine (CLARITIN) 10 MG tablet Take 10 mg by mouth daily.      . midodrine (PROAMATINE) 5 MG tablet Take 1 tablet (5 mg total) by mouth 3 (three) times daily. One by mouth three times daily  270 tablet  3  . Multiple Vitamins-Minerals (CVS SPECTRAVITE SENIOR) TABS Take 1 tablet by mouth daily.        . niacin (NIASPAN) 500 MG CR tablet Take 500 mg by mouth daily.        . nitroGLYCERIN (NITROSTAT) 0.4 MG SL tablet Place 0.4 mg under the tongue every 5 (five) minutes as needed.        . Probiotic Product (CVS SENIOR PROBIOTIC PO) Once daily       . sertraline (ZOLOFT) 25 MG tablet Once daily      . SPIRIVA HANDIHALER 18 MCG inhalation capsule PLACE 1 CAPSULE (18 MCG TOTAL) INTO INHALER AND INHALE DAILY.  30 capsule  3  . Tamsulosin HCl (FLOMAX) 0.4 MG CAPS Take 0.4 mg by mouth daily.        . metoprolol tartrate (LOPRESSOR) 25 MG tablet Take 1 tablet (25 mg total) by mouth 2 (two) times daily.  60 tablet  6   No current facility-administered medications for this visit.    History   Social History  . Marital Status: Married  Spouse Name: N/A    Number of Children: N/A  . Years of Education: N/A   Occupational History  . 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 Smoking status: Former Smoker -- 1.00 packs/day for 47 years    Types: Cigarettes    Quit date: 12/30/1993  . Smokeless tobacco: Never Used  . Alcohol Use: Not on file  . Drug Use: No  . Sexual Activity: Not on file   Other Topics Concern  . Not on file   Social History Narrative  . No narrative on file      ROS is negative for fevers, chills or night sweats. He denies chest pain. He does note presyncope the is status post a recent evaluation at [facility]. He denies bleeding. Does have a history of anemia of renal disease and iron deficiency. He does  have COPD. He denies recent wheezing. He denies significant leg swelling. He denies claudication.Marland Kitchen He does note palpitations.  Other system review is negative.  PE BP 118/62  Pulse 86  Ht 5\' 10"  (1.778 m)  Wt 171 lb (77.565 kg)  BMI 24.54 kg/m2  Repeat blood pressure by me was 110/70 supine and 114/74 standing without orthostatic pulse rise. General: Alert, oriented, no distress.  Skin: normal turgor, no rashes HEENT: Normocephalic, atraumatic. Pupils round and reactive; sclera anicteric;no lid lag.  Nose without nasal septal hypertrophy Mouth/Parynx benign; Mallinpatti scale 3 Neck: No JVD, no carotid briuts Lungs: clear to ausculatation and percussion; no wheezing or rales Heart: RRR, s1 s2 normal over 6 systolic murmur Abdomen: soft, nontender; no hepatosplenomehaly, BS+; abdominal aorta nontender and not dilated by palpation. Pulses 2+ Extremities: no clubbing cyanosis or edema, Homan's sign negative  Neurologic: grossly nonfocal  ECG: Sinus rhythm with PAC. Nonspecific ST changes. Normal intervals.  LABS:  BMET    Component Value Date/Time   NA 142 08/04/2013 1611   K 4.3 08/04/2013 1611   CL 109 08/04/2013 1611   CO2 25 08/04/2013 1611   GLUCOSE 90 08/04/2013 1611   BUN 26* 08/04/2013 1611   CREATININE 1.37* 08/04/2013 1611   CREATININE 1.41* 09/29/2012 1117   CALCIUM 9.3 08/04/2013 1611   GFRNONAA 47* 04/08/2011 1005   GFRAA  Value: 57        The eGFR has been calculated using the MDRD equation. This calculation has not been validated in all clinical situations. eGFR's persistently <60 mL/min signify possible Chronic Kidney Disease.* 04/08/2011 1005     Hepatic Function Panel     Component Value Date/Time   PROT 6.6 08/04/2013 1611   ALBUMIN 3.9 08/04/2013 1611   AST 31 08/04/2013 1611   ALT 34 08/04/2013 1611   ALKPHOS 112 08/04/2013 1611   BILITOT 0.8 08/04/2013 1611     CBC    Component Value Date/Time   WBC 4.3 08/04/2013 1611   WBC 3.9* 04/09/2013 1402   RBC 3.73* 08/04/2013 1611    RBC 3.81* 12/05/2011 1452   RBC 3.81* 12/05/2011 1452   RBC 3.81* 12/05/2011 1452   RBC 3.81* 12/05/2011 1452   RBC 3.81* 12/05/2011 1452   HGB 11.7* 08/04/2013 1611   HGB 11.6* 04/09/2013 1402   HCT 34.5* 08/04/2013 1611   HCT 35.1* 04/09/2013 1402   PLT 171 08/04/2013 1611   PLT 149 04/09/2013 1402   MCV 92.5 08/04/2013 1611   MCV 94 04/09/2013 1402   MCH 31.4 08/04/2013 1611   MCH 31.1  04/09/2013 1402   MCHC 33.9 08/04/2013 1611   MCHC 33.0 04/09/2013 1402   RDW 13.9 08/04/2013 1611   RDW 13.1 04/09/2013 1402   LYMPHSABS 1.0 04/09/2013 1402   LYMPHSABS 0.8 12/26/2010 1743   MONOABS 0.6 12/26/2010 1743   EOSABS 0.1 04/09/2013 1402   EOSABS 0.0 12/26/2010 1743   BASOSABS 0.0 04/09/2013 1402   BASOSABS 0.0 12/26/2010 1743     BNP    Component Value Date/Time   PROBNP 221.0* 01/08/2011 0430    Lipid Panel  No results found for this basename: chol, trig, hdl, cholhdl, vldl, ldlcalc     RADIOLOGY: No results found.    ASSESSMENT AND PLAN: [name] check is now almost 8 years following CABG surgery it was found that high-grade left main and RCA disease the in the past, he did have a positive tilt table test. He recently has been noted to have bursts of paroxysmal atrial tachycardia and SVT on his most recent CardioNet monitor. His resting pulse is 86 and his current ECG. I'm recommending initiation of low-dose beta blocker therapy with Lopressor initially at 25 mg twice a day. He continues to be active and bowls at least 2 times per week. He denies any anginal symptoms. His last echo Doppler study in April 2014 showed normal systolic function with grade 1 diastolic dysfunction and evidence for moderate tricuspid regurgitation and mild pulmonary hypertension. Repeat laboratory be obtained a CMP, CBC, magnesium and TSH level . I will see him in 4 weeks for followup evaluation or sooner if problems arise.     Lennette Bihari, MD, [facility]  08/15/2013 8:42 AM

## 2013-08-16 ENCOUNTER — Encounter: Payer: Self-pay | Admitting: *Deleted

## 2013-08-23 ENCOUNTER — Other Ambulatory Visit: Payer: Self-pay | Admitting: Cardiovascular Disease

## 2013-08-23 NOTE — Telephone Encounter (Signed)
Requesting refill on Zoloft. Will defer to Dr. Tresa Endo and Burna Mortimer

## 2013-08-23 NOTE — Telephone Encounter (Signed)
zoloft refused. This will need to be fillled by his PCP.

## 2013-08-26 ENCOUNTER — Ambulatory Visit (INDEPENDENT_AMBULATORY_CARE_PROVIDER_SITE_OTHER): Payer: Medicare Other | Admitting: Emergency Medicine

## 2013-08-26 ENCOUNTER — Encounter: Payer: Self-pay | Admitting: Emergency Medicine

## 2013-08-26 ENCOUNTER — Ambulatory Visit (INDEPENDENT_AMBULATORY_CARE_PROVIDER_SITE_OTHER)
Admission: RE | Admit: 2013-08-26 | Discharge: 2013-08-26 | Disposition: A | Discharge status: Home / Self Care | Payer: Medicare Other | Source: Ambulatory Visit | Attending: Emergency Medicine | Admitting: Emergency Medicine

## 2013-08-26 VITALS — BP 130/60 | HR 83 | Ht 69.0 in | Wt 175.2 lb

## 2013-08-26 DIAGNOSIS — J4489 Other specified chronic obstructive pulmonary disease: Secondary | ICD-10-CM

## 2013-08-26 DIAGNOSIS — J449 Chronic obstructive pulmonary disease, unspecified: Secondary | ICD-10-CM

## 2013-08-26 DIAGNOSIS — R0609 Other forms of dyspnea: Secondary | ICD-10-CM | POA: Diagnosis not present

## 2013-08-26 NOTE — Progress Notes (Signed)
79 yo man with COPD, CAD/CABG (06) followed by Dr Tresa Endo.   ROV 09/10/12 -- COPD, allergic rhinitis and associated cough. Not on nasonex right now. He is on loratadine, spiriva + symbicort. Doing well. Last AE was this Spring. Needs the flu shot.   ROV 03/18/13 -- COPD, allergies, CAD, MGUS ([place]). Has been doing well since last time. Never uses SABA. No exacerbations since last time.  He follows with Dr Tresa Endo for TTE 04/13/13.  On loratadine, allergies well control.   ROV 08/26/13 -- COPD, allergies, CAD. Hx cough. He has noticed more exertional SOB for last 5-6 months. 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 He is on spiriva and symbicort. He was asked by Dr Tresa Endo to back off some on his aerobic exercise and at the [employer]. Wt is stable. Very little cough, no wheeze. Never uses albuterol. He had an episode of syncope at bowling alley June 24 > daughter reports that he had SVT, started on metoprolol.     EXAM :  Filed Vitals:   08/26/13 1335  BP: 130/60  Pulse: 83  Height: 5\' 9"  (1.753 m)  Weight: 175 lb 3.2 oz (79.47 kg)  SpO2: 95%   Gen: Pleasant,  in no distress,  normal affect  ENT: No lesions,  mouth clear,  oropharynx clear, no postnasal drip  Neck: No JVD, no TMG, no carotid bruits  Lungs: No use of accessory muscles, distant, no wheeze or crackles. , diminshed BS in bases   Cardiovascular: RRR, heart sounds normal, no murmur or gallops, no peripheral edema  Musculoskeletal: No deformities, no cyanosis or clubbing  Neuro: alert, non focal  Skin: Warm, no lesions or rashes  C O P D With more dyspnea. Not clear that this relates to his COPD, but will check CXR, spiro, walking oximetry today. Continue spiriva + symbicort.

## 2013-08-26 NOTE — Patient Instructions (Addendum)
Spirometry today CXR today Walking oximetry today-- o2 levels remain at good levels Please continue your Spiriva and Symbicort Use albuterol as needed.  Follow with Dr Delton Coombes in 1 month with PFTs

## 2013-08-26 NOTE — Assessment & Plan Note (Addendum)
With more dyspnea. Not clear that this relates to his COPD, but will check CXR, spiro, walking oximetry today. Continue spiriva + symbicort.   Suspicious that this may relate to his newly identified SVT. The dyspnea was present before he started metoprolol

## 2013-08-27 NOTE — Telephone Encounter (Signed)
Dr. Tresa Endo please review.

## 2013-08-27 NOTE — Progress Notes (Signed)
Quick Note:  Advised patient of results and patient verbalized understanding and has no questions or concerns at this time ______

## 2013-09-01 ENCOUNTER — Encounter: Payer: Self-pay | Admitting: Cardiovascular Disease

## 2013-09-02 ENCOUNTER — Ambulatory Visit (INDEPENDENT_AMBULATORY_CARE_PROVIDER_SITE_OTHER): Payer: Medicare Other | Admitting: Cardiovascular Disease

## 2013-09-02 ENCOUNTER — Encounter: Payer: Self-pay | Admitting: Cardiovascular Disease

## 2013-09-02 VITALS — BP 100/60 | HR 66 | Ht 70.5 in | Wt 174.3 lb

## 2013-09-02 DIAGNOSIS — J449 Chronic obstructive pulmonary disease, unspecified: Secondary | ICD-10-CM

## 2013-09-02 DIAGNOSIS — I2510 Atherosclerotic heart disease of native coronary artery without angina pectoris: Secondary | ICD-10-CM

## 2013-09-02 DIAGNOSIS — R55 Syncope and collapse: Secondary | ICD-10-CM

## 2013-09-02 DIAGNOSIS — I471 Supraventricular tachycardia: Secondary | ICD-10-CM

## 2013-09-02 MED ORDER — NIACIN ER (ANTIHYPERLIPIDEMIC) 500 MG PO TBCR: 500.0000 mg | EXTENDED_RELEASE_TABLET | Freq: Every day | ORAL | Status: DC

## 2013-09-02 MED ORDER — SERTRALINE HCL 25 MG PO TABS: 25.0000 mg | ORAL_TABLET | Freq: Every day | ORAL | Status: DC

## 2013-09-02 NOTE — Progress Notes (Signed)
Patient ID: [name], male   DOB: 06-29-1929, 79 y.o.   MRN: [id number]     HPI: [name], is a 79 y.o. male who presents for cardiology followup evaluation. I last saw him on August 04, 2013.  [name] has known coronary artery disease. In December 2006 was found to have 90% left main stenosis as well as high grade RCA stenosis. [name] underwent CABG surgery x4 by Dr. Lavinia Sharps with the LIMA to the LAD, a vein to the distal circumflex, and sequential vein to the intermediate and obtuse marginal vessel. Additional problems include mixed hyperlipidemia, hypertension, COPD/asthma, a history of IgG lambda monoclonal mammography. [name] has remained fairly active. [name] has a history of having a positive tilt table test for which [name] has done well with Midodrin.   When I last saw him, I reviewed a CardioNet monitor which was done for episodes of dizziness and this demonstrated frequent PACs and PVCs but several bursts of paroxysmal supraventricular tachycardia with rates as high as 1 5180 beats per minute. When I saw him, I did initiate therapy with Lopressor 25 mg twice a day. [name] is felt well with this therapy. [name] is unaware of any further breakthrough tachycardia palpitations. [name] denies any significant dizziness orthostatic symptoms.  I also checked laboratory which revealed a BUN of 26 currently 1.37. Normal electrolytes. Magnesium was 1.7. Hemoglobin 11.7 hematocrit 34.5. TSH was normal at 3.8. [name] presents now for followup evaluation.  Past Medical History  Diagnosis Date  . Coronary heart disease     2D Echo, 04/13/2013 - EF 55-60%, moderate regurg of the tricuspid valve  . COPD (chronic obstructive pulmonary disease)   . MGUS (monoclonal gammopathy of unknown significance) 12/05/2011  . Anemia of renal disease 12/05/2011  . Anemia, iron deficiency 12/05/2011  . CAS (cerebral atherosclerosis)     Carotid Dopplers, 04/09/2012 - Bilateral Bulb/Proximal ICAs-mild to moderate amount of fibrous  plaque of fibrous soft plaque w/o evidence of a significant diameter reduction, dissection, or any other vascular abnormality  . Nonspecific ST-T wave electrocardiographic changes     Lexiscan, 06/02/2012 - EKG negative for ischemia, compared to previous study-there is no significant change, normal myocardial perfusion study  . Asthma     Past Surgical History  Procedure Laterality Date  . Cardiac catheterization  12/05/2005    Recommended CABG  . Coronary artery bypass graft  12/06/2005    x4, LIMA to LAD, vein to distal circumflex, sequential vein to intermediate and obtuse marginal vessel    No Known Allergies  Current Outpatient Prescriptions  Medication Sig Dispense Refill  . albuterol (PROAIR HFA) 108 (90 BASE) MCG/ACT inhaler Inhale 2 puffs into the lungs every 6 (six) hours as needed.      Marland Kitchen aspirin 81 MG tablet Take 81 mg by mouth daily.        Marland Kitchen atorvastatin (LIPITOR) 40 MG tablet TAKE 1 TABLET BY MOUTH EVERY DAY  30 tablet  11  . budesonide-formoterol (SYMBICORT) 80-4.5 MCG/ACT inhaler Inhale 2 puffs into the lungs 2 (two) times daily.  1 Inhaler  5  . diphenhydramine-acetaminophen (TYLENOL PM) 25-500 MG TABS Take 1 tablet by mouth at bedtime as needed.        . loratadine (CLARITIN) 10 MG tablet Take 10 mg by mouth daily.      . metoprolol tartrate (LOPRESSOR) 25 MG tablet Take 1 tablet (25 mg total) by mouth 2 (two) times daily.  60 tablet  6  . midodrine (PROAMATINE)  5 MG tablet Take 1 tablet (5 mg total) by mouth 3 (three) times daily. One by mouth three times daily  270 tablet  3  . Multiple Vitamins-Minerals (CVS SPECTRAVITE SENIOR) TABS Take 1 tablet by mouth daily.        . niacin (NIASPAN) 500 MG CR tablet Take 1 tablet (500 mg total) by mouth at bedtime.  30 tablet  11  . nitroGLYCERIN (NITROSTAT) 0.4 MG SL tablet Place 0.4 mg under the tongue every 5 (five) minutes as needed.        . Probiotic Product (CVS SENIOR PROBIOTIC PO) Once daily       . sertraline (ZOLOFT) 25  MG tablet Take 1 tablet (25 mg total) by mouth daily. Once daily  30 tablet  6  . SPIRIVA HANDIHALER 18 MCG inhalation capsule PLACE 1 CAPSULE (18 MCG TOTAL) INTO INHALER AND INHALE DAILY.  30 capsule  3  . Tamsulosin HCl (FLOMAX) 0.4 MG CAPS Take 0.4 mg by mouth daily.         No current facility-administered medications for this visit.    History   Social History  . Marital Status: Married    Spouse Name: N/A    Number of Children: N/A  . Years of Education: N/A   Occupational History  . 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 Smoking status: Former Smoker -- 1.00 packs/day for 47 years    Types: Cigarettes    Quit date: 12/30/1993  . Smokeless tobacco: Never Used  . Alcohol Use: Not on file  . Drug Use: No  . Sexual Activity: Not on file   Other Topics Concern  . Not on file   Social History Narrative  . No narrative on file      ROS is negative for fevers, chills or night sweats. [name] denies chest pain. [name] continues to be active and bowls. [name] denies any further episodes of tachycardia palpitations. [name] is unaware of his rhythm. [name] denies bleeding. Does have a history of anemia of renal disease and iron deficiency. [name] does have COPD. [name] denies recent wheezing. [name] is followed by Dr. Myna Hidalgo for his monoclonal gammopathy [name] denies significant leg swelling. [name] denies claudication. [name] does note palpitations.  Other system review is negative.  PE BP 100/60  Pulse 66  Ht 5' 10.5" (1.791 m)  Wt 174 lb 4.8 oz (79.062 kg)  BMI 24.65 kg/m2  Repeat blood pressure by me was 122/70 supine. In the standing position blood pressure was 120/70. [name] did not have significant orthostatic pulse rise General: Alert, oriented, no distress.  Skin: normal turgor, no rashes HEENT: Normocephalic, atraumatic. Pupils round and reactive; sclera anicteric;no lid lag.  Nose without nasal septal hypertrophy Mouth/Parynx benign; Mallinpatti scale 3 Neck: No  JVD, no carotid briuts Lungs: clear to ausculatation and percussion; no wheezing or rales Heart: RRR, s1 s2 normal  16 systolic murmur Abdomen: Mild diastases recti; soft, nontender; no hepatosplenomehaly, BS+; abdominal aorta nontender and not dilated by palpation. Pulses 2+ Extremities: no clubbing cyanosis or edema, Homan's sign negative  Neurologic: grossly nonfocal  ECG: Sinus rhythm at 66 with mild sinus arrhythmia.. Nonspecific ST changes. Normal intervals.  LABS:  BMET    Component Value Date/Time   NA 142 08/04/2013 1611   K 4.3 08/04/2013 1611   CL 109 08/04/2013 1611   CO2 25 08/04/2013 1611   GLUCOSE 90 08/04/2013 1611   BUN 26*  08/04/2013 1611   CREATININE 1.37* 08/04/2013 1611   CREATININE 1.41* 09/29/2012 1117   CALCIUM 9.3 08/04/2013 1611   GFRNONAA 47* 04/08/2011 1005   GFRAA  Value: 57        The eGFR has been calculated using the MDRD equation. This calculation has not been validated in all clinical situations. eGFR's persistently <60 mL/min signify possible Chronic Kidney Disease.* 04/08/2011 1005     Hepatic Function Panel     Component Value Date/Time   PROT 6.6 08/04/2013 1611   ALBUMIN 3.9 08/04/2013 1611   AST 31 08/04/2013 1611   ALT 34 08/04/2013 1611   ALKPHOS 112 08/04/2013 1611   BILITOT 0.8 08/04/2013 1611     CBC    Component Value Date/Time   WBC 4.3 08/04/2013 1611   WBC 3.9* 04/09/2013 1402   RBC 3.73* 08/04/2013 1611   RBC 3.81* 12/05/2011 1452   RBC 3.81* 12/05/2011 1452   RBC 3.81* 12/05/2011 1452   RBC 3.81* 12/05/2011 1452   RBC 3.81* 12/05/2011 1452   HGB 11.7* 08/04/2013 1611   HGB 11.6* 04/09/2013 1402   HCT 34.5* 08/04/2013 1611   HCT 35.1* 04/09/2013 1402   PLT 171 08/04/2013 1611   PLT 149 04/09/2013 1402   MCV 92.5 08/04/2013 1611   MCV 94 04/09/2013 1402   MCH 31.4 08/04/2013 1611   MCH 31.1 04/09/2013 1402   MCHC 33.9 08/04/2013 1611   MCHC 33.0 04/09/2013 1402   RDW 13.9 08/04/2013 1611   RDW 13.1 04/09/2013 1402   LYMPHSABS 1.0 04/09/2013 1402   LYMPHSABS 0.8  12/26/2010 1743   MONOABS 0.6 12/26/2010 1743   EOSABS 0.1 04/09/2013 1402   EOSABS 0.0 12/26/2010 1743   BASOSABS 0.0 04/09/2013 1402   BASOSABS 0.0 12/26/2010 1743     BNP    Component Value Date/Time   PROBNP 221.0* 01/08/2011 0430    Lipid Panel  No results found for this basename: chol,  trig,  hdl,  cholhdl,  vldl,  ldlcalc     RADIOLOGY: No results found.    ASSESSMENT AND PLAN: [name] is now 8 years following CABG surgery for high-grade left main and RCA disease. [name] has a history of a positive tilt table test and has done well with Midrin. [name] recently was found to have personally SVT. Since initiating low-dose Lopressor at 25 mg twice a day [name] feels his symptoms have significantly improved. [name] denies any significant episodes of dizziness or where [name] does have tachycardia palpitations. [name] is unaware of any significant bradycardia arrhythmias. [name] is not orthostatic on physical exam today. I did review his recent laboratory with him in detail. Clinically [name] seems to have done wellhis medication adjustments. His ECG shows sinus rhythm in the 60s with mild sinus arrhythmia . As long as [name] remains stable I will see him in 6 months for cardiology followup evaluation or sooner if problems arise    Lennette Bihari, MD, [facility]  09/02/2013 2:25 PM

## 2013-09-02 NOTE — Patient Instructions (Addendum)
Your physician wants you to follow-up in:  6 months. You will receive a reminder letter in the mail two months in advance. If you don't receive a letter, please call our office to schedule the follow-up appointment.   

## 2013-09-03 ENCOUNTER — Other Ambulatory Visit: Payer: Self-pay | Admitting: Cardiovascular Disease

## 2013-09-03 NOTE — Telephone Encounter (Signed)
Daughter says she called the pharmacist says there is still no refill on Sertraline.She thought you did it while they here in the office yesterday.

## 2013-10-07 ENCOUNTER — Encounter: Payer: Self-pay | Admitting: Emergency Medicine

## 2013-10-07 ENCOUNTER — Ambulatory Visit (INDEPENDENT_AMBULATORY_CARE_PROVIDER_SITE_OTHER): Payer: Medicare Other | Admitting: Emergency Medicine

## 2013-10-07 VITALS — BP 130/80 | HR 104 | Temp 97.8°F | Ht 70.0 in | Wt 178.0 lb

## 2013-10-07 DIAGNOSIS — J4489 Other specified chronic obstructive pulmonary disease: Secondary | ICD-10-CM

## 2013-10-07 DIAGNOSIS — Z23 Encounter for immunization: Secondary | ICD-10-CM

## 2013-10-07 DIAGNOSIS — J449 Chronic obstructive pulmonary disease, unspecified: Secondary | ICD-10-CM | POA: Diagnosis not present

## 2013-10-07 NOTE — Assessment & Plan Note (Signed)
Stable by PFT's compared with 2009. CXR also stable.  - will work on cardiopulm conditioning - offered cardiopulm rehab [facility] - flu shot - same BDs - rov 6

## 2013-10-07 NOTE — Patient Instructions (Signed)
Your PFT's and CXR are stable compared with 2009. Try to increase your exercise routine to see if this helps your shortness of breath.  Flu Shot today Call us if you would like to set up Pulmonary rehab at [facility] Follow with Dr Delton Coombes in 6 months or sooner if you have any problems

## 2013-10-07 NOTE — Progress Notes (Signed)
PFT done today. 

## 2013-10-07 NOTE — Progress Notes (Signed)
79 yo man with COPD, CAD/CABG (06) followed by Dr Tresa Endo.   ROV 09/10/12 -- COPD, allergic rhinitis and associated cough. Not on nasonex right now. He is on loratadine, spiriva + symbicort. Doing well. Last AE was this Spring. Needs the flu shot.   ROV 03/18/13 -- COPD, allergies, CAD, MGUS (Enniver). Has been doing well since last time. Never uses SABA. No exacerbations since last time.  He follows with Dr Tresa Endo for TTE 04/13/13.  On loratadine, allergies well control.   ROV 08/26/13 -- COPD, allergies, CAD. Hx cough. He has noticed more exertional SOB for last 5-6 months. 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 He is on spiriva and symbicort. He was asked by Dr Tresa Endo to back off some on his aerobic exercise and at the [employer]. Wt is stable. Very little cough, no wheeze. Never uses albuterol. He had an episode of syncope [place] bowling [place] > daughter reports that he had SVT, started on metoprolol.   ROV 10/07/13 -- COPD and allergic rhinitis, cough. Last time we ordered repeat PFT due to worsening SOB. The breathing is still about the same - not as good as 1-2 years ago. His PFT show stable spirometry. The lung volumes show a decreased TLC compared with 2009.     EXAM :  Filed Vitals:   10/07/13 1555  BP: 130/80  Pulse: 104  Temp: 97.8 F (36.6 C)  TempSrc: Oral  Height: 5\' 10"  (1.778 m)  Weight: 178 lb (80.74 kg)  SpO2: 96%   Gen: Pleasant,  in no distress,  normal affect  ENT: No lesions,  mouth clear,  oropharynx clear, no postnasal drip  Neck: No JVD, no TMG, no carotid bruits  Lungs: No use of accessory muscles, distant, no wheeze or crackles. , diminshed BS in bases   Cardiovascular: RRR, heart sounds normal, no murmur or gallops, no peripheral edema  Musculoskeletal: No deformities, no cyanosis or clubbing  Neuro: alert, non focal  Skin: Warm, no lesions or rashes  C O P D Stable by PFT's compared with 2009. CXR also stable.   - will work on cardiopulm conditioning - offered [facility]ulm re[facility] - flu shot - same BDs - rov 6

## 2013-10-08 ENCOUNTER — Other Ambulatory Visit (HOSPITAL_BASED_OUTPATIENT_CLINIC_OR_DEPARTMENT_OTHER): Payer: Medicare Other | Admitting: Lab

## 2013-10-08 ENCOUNTER — Ambulatory Visit: Payer: Medicare Other

## 2013-10-08 ENCOUNTER — Ambulatory Visit (HOSPITAL_BASED_OUTPATIENT_CLINIC_OR_DEPARTMENT_OTHER): Payer: Medicare Other | Admitting: Hematology & Oncology

## 2013-10-08 ENCOUNTER — Other Ambulatory Visit: Payer: Self-pay | Admitting: *Deleted

## 2013-10-08 VITALS — BP 137/78 | HR 98 | Temp 98.3°F | Resp 18 | Ht 70.0 in | Wt 178.0 lb

## 2013-10-08 DIAGNOSIS — D472 Monoclonal gammopathy: Secondary | ICD-10-CM

## 2013-10-08 DIAGNOSIS — N189 Chronic kidney disease, unspecified: Secondary | ICD-10-CM

## 2013-10-08 DIAGNOSIS — D649 Anemia, unspecified: Secondary | ICD-10-CM

## 2013-10-08 DIAGNOSIS — N289 Disorder of kidney and ureter, unspecified: Secondary | ICD-10-CM

## 2013-10-08 DIAGNOSIS — D631 Anemia in chronic kidney disease: Secondary | ICD-10-CM

## 2013-10-08 DIAGNOSIS — D509 Iron deficiency anemia, unspecified: Secondary | ICD-10-CM

## 2013-10-08 LAB — CBC WITH DIFFERENTIAL (CANCER CENTER ONLY)
BASO%: 1.5 % (ref 0.0–2.0)
EOS%: 2 % (ref 0.0–7.0)
HCT: 35.2 % — ABNORMAL LOW (ref 38.7–49.9)
LYMPH%: 20.2 % (ref 14.0–48.0)
MCH: 32 pg (ref 28.0–33.4)
MCHC: 32.4 g/dL (ref 32.0–35.9)
MCV: 99 fL — ABNORMAL HIGH (ref 82–98)
MONO#: 0.4 10*3/uL (ref 0.1–0.9)
MONO%: 10.1 % (ref 0.0–13.0)
NEUT%: 66.2 % (ref 40.0–80.0)
Platelets: 148 10*3/uL (ref 145–400)
RBC: 3.56 10*6/uL — ABNORMAL LOW (ref 4.20–5.70)
RDW: 12.9 % (ref 11.1–15.7)
WBC: 4.1 10*3/uL (ref 4.0–10.0)

## 2013-10-08 NOTE — Progress Notes (Signed)
Pt seen by Dr. Myna Hidalgo, no order for any injections. Hgb 11.4.

## 2013-10-08 NOTE — Progress Notes (Signed)
This office note has been dictated.

## 2013-10-09 NOTE — Progress Notes (Signed)
CC:   Lance Hamrick, MD  DIAGNOSES: 1. 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 2. Anemia secondary to renal insufficiency. 3. Intermittent iron-deficiency anemia.  CURRENT THERAPY:  Observation.  INTERIM HISTORY:  [name] comes in for followup.  We saw him back in April.  Since then, he has been doing well.  He had a good summer. He is still doing his woodworking.  He thoroughly enjoys this.  Of note, the last time he got iron was back in April.  At that point in time, his iron saturation was 20% with a ferritin of 135.  He has had no problems with bleeding or bruising.  He continues on his medications.  He is on quite a few medications.  When we last checked his monoclonal studies, his monoclonal spike was stable at 0.47 g/dL.  His IgG level was 1070 mg/dL.  Lambda light chain was 5.29 mg/dL.  He has had no fever.  He has had no cough.  He has had no change in bowel or bladder habits.  He recently saw Dr. Byrum of Pulmonary Medicine.  Dr. Byrum did not think that anything else needed to be done with respect to his COPD.  PHYSICAL EXAMINATION:  General:  This is an this is an elderly white gentleman in no obvious distress.  Vital signs:  Temperature of 98.3, pulse 110, respiratory rate 18, blood pressure 137/78.  Weight is 178 pounds.  Head and neck:  Normocephalic, atraumatic skull.  There are no ocular or oral lesions.  There are no palpable cervical or supraclavicular lymph nodes.  Lungs:  Clear bilaterally.  May be some slight decrease at the bases.  Cardiac:  Tachycardic but regular.  He has no murmurs, rubs or bruits.  Abdomen:  Soft.  He has good bowel sounds.  There is no fluid wave.  There is no palpable hepatosplenomegaly.  Back:  No tenderness over the spine, ribs, or hips. Extremities:  No clubbing, cyanosis or edema.  He has osteoarthritic changes in his joints.  He has 4/5 muscle strength bilaterally.  Skin: Some scattered  ecchymoses.  LABORATORY STUDIES:  White cell count is 4.1, hemoglobin 11.4, hematocrit 35.2, platelet count 148.  MCV is 99.  IMPRESSION:  [name] is an 79-year-old gentleman with anemia.  The anemia is secondary to iron deficiency and, to some degree, renal insufficiency.  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  His hemoglobin really is holding steady.  We will see what his iron studies show.  I think we can probably get him back in 6 months.    ______________________________ [name], M.D. PRE/MEDQ  D:  10/08/2013  T:  10/09/2013  Job:  [id number] 

## 2013-10-11 LAB — IRON AND TIBC CHCC
%SAT: 25 % (ref 20–55)
UIBC: 175 ug/dL (ref 117–376)

## 2013-10-11 LAB — FERRITIN CHCC: Ferritin: 322 ng/ml — ABNORMAL HIGH (ref 22–316)

## 2013-10-12 LAB — COMPREHENSIVE METABOLIC PANEL
ALT: 33 U/L (ref 0–53)
AST: 28 U/L (ref 0–37)
Alkaline Phosphatase: 92 U/L (ref 39–117)
CO2: 26 mEq/L (ref 19–32)
Calcium: 9.1 mg/dL (ref 8.4–10.5)
Creatinine, Ser: 1.31 mg/dL (ref 0.50–1.35)
Sodium: 139 mEq/L (ref 135–145)
Total Bilirubin: 1.1 mg/dL (ref 0.3–1.2)
Total Protein: 6.5 g/dL (ref 6.0–8.3)

## 2013-10-12 LAB — PROTEIN ELECTROPHORESIS, SERUM
Alpha-1-Globulin: 6.6 % — ABNORMAL HIGH (ref 2.9–4.9)
Alpha-2-Globulin: 11.4 % (ref 7.1–11.8)
Gamma Globulin: 17.8 % (ref 11.1–18.8)
M-Spike, %: 0.37 g/dL

## 2013-10-12 LAB — KAPPA/LAMBDA LIGHT CHAINS
Kappa free light chain: 12.8 mg/dL — ABNORMAL HIGH (ref 0.33–1.94)
Kappa:Lambda Ratio: 1.86 — ABNORMAL HIGH (ref 0.26–1.65)
Lambda Free Lght Chn: 6.89 mg/dL — ABNORMAL HIGH (ref 0.57–2.63)

## 2013-10-12 LAB — IGG, IGA, IGM
IgG (Immunoglobin G), Serum: 972 mg/dL (ref 650–1600)
IgM, Serum: 322 mg/dL — ABNORMAL HIGH (ref 41–251)

## 2013-10-14 ENCOUNTER — Telehealth: Payer: Self-pay | Admitting: Nurse Practitioner

## 2013-10-14 NOTE — Telephone Encounter (Addendum)
Message copied by Glee Arvin on Thu Oct 14, 2013  4:46 PM ------      Message from: Josph Macho      Created: Mon Oct 11, 2013  6:49 PM       Please call his daughter and let her know that his labs are looking good no evidence of problems. Thanks. Pete ------ LVM on daughter's cell phone and instructed to return call if they have any questions.

## 2013-10-29 ENCOUNTER — Encounter: Payer: Self-pay | Admitting: Emergency Medicine

## 2013-11-01 ENCOUNTER — Other Ambulatory Visit: Payer: Self-pay | Admitting: Emergency Medicine

## 2013-11-05 ENCOUNTER — Other Ambulatory Visit: Payer: Self-pay | Admitting: Emergency Medicine

## 2013-11-08 ENCOUNTER — Inpatient Hospital Stay (HOSPITAL_COMMUNITY)
Admission: EM | Admit: 2013-11-08 | Discharge: 2013-11-10 | DRG: 310 | Disposition: A | Discharge status: Home / Self Care | Payer: Medicare Other | Attending: Internal Medicine | Admitting: Internal Medicine

## 2013-11-08 ENCOUNTER — Encounter (HOSPITAL_COMMUNITY): Payer: Self-pay | Admitting: Emergency Medicine

## 2013-11-08 ENCOUNTER — Emergency Department (HOSPITAL_COMMUNITY): Payer: Medicare Other

## 2013-11-08 DIAGNOSIS — I471 Supraventricular tachycardia, unspecified: Principal | ICD-10-CM | POA: Diagnosis present

## 2013-11-08 DIAGNOSIS — R404 Transient alteration of awareness: Secondary | ICD-10-CM | POA: Diagnosis not present

## 2013-11-08 DIAGNOSIS — I4949 Other premature depolarization: Secondary | ICD-10-CM | POA: Diagnosis present

## 2013-11-08 DIAGNOSIS — D472 Monoclonal gammopathy: Secondary | ICD-10-CM | POA: Diagnosis present

## 2013-11-08 DIAGNOSIS — Z951 Presence of aortocoronary bypass graft: Secondary | ICD-10-CM | POA: Diagnosis not present

## 2013-11-08 DIAGNOSIS — R55 Syncope and collapse: Secondary | ICD-10-CM | POA: Diagnosis present

## 2013-11-08 DIAGNOSIS — Z79899 Other long term (current) drug therapy: Secondary | ICD-10-CM

## 2013-11-08 DIAGNOSIS — I4892 Unspecified atrial flutter: Secondary | ICD-10-CM | POA: Diagnosis not present

## 2013-11-08 DIAGNOSIS — R000 Tachycardia, unspecified: Secondary | ICD-10-CM | POA: Diagnosis not present

## 2013-11-08 DIAGNOSIS — Z7982 Long term (current) use of aspirin: Secondary | ICD-10-CM

## 2013-11-08 DIAGNOSIS — D509 Iron deficiency anemia, unspecified: Secondary | ICD-10-CM

## 2013-11-08 DIAGNOSIS — J4489 Other specified chronic obstructive pulmonary disease: Secondary | ICD-10-CM | POA: Diagnosis present

## 2013-11-08 DIAGNOSIS — R9431 Abnormal electrocardiogram [ECG] [EKG]: Secondary | ICD-10-CM

## 2013-11-08 DIAGNOSIS — I2510 Atherosclerotic heart disease of native coronary artery without angina pectoris: Secondary | ICD-10-CM | POA: Diagnosis not present

## 2013-11-08 DIAGNOSIS — J449 Chronic obstructive pulmonary disease, unspecified: Secondary | ICD-10-CM

## 2013-11-08 DIAGNOSIS — J309 Allergic rhinitis, unspecified: Secondary | ICD-10-CM

## 2013-11-08 DIAGNOSIS — I4891 Unspecified atrial fibrillation: Secondary | ICD-10-CM | POA: Diagnosis not present

## 2013-11-08 DIAGNOSIS — E782 Mixed hyperlipidemia: Secondary | ICD-10-CM | POA: Diagnosis present

## 2013-11-08 DIAGNOSIS — Z87891 Personal history of nicotine dependence: Secondary | ICD-10-CM

## 2013-11-08 DIAGNOSIS — D649 Anemia, unspecified: Secondary | ICD-10-CM | POA: Diagnosis present

## 2013-11-08 DIAGNOSIS — I10 Essential (primary) hypertension: Secondary | ICD-10-CM | POA: Diagnosis present

## 2013-11-08 DIAGNOSIS — J438 Other emphysema: Secondary | ICD-10-CM

## 2013-11-08 DIAGNOSIS — D631 Anemia in chronic kidney disease: Secondary | ICD-10-CM

## 2013-11-08 DIAGNOSIS — R0602 Shortness of breath: Secondary | ICD-10-CM | POA: Diagnosis not present

## 2013-11-08 HISTORY — DX: Shortness of breath: R06.02

## 2013-11-08 HISTORY — DX: Cardiac arrhythmia, unspecified: I49.9

## 2013-11-08 HISTORY — DX: Calculus of kidney: N20.0

## 2013-11-08 LAB — COMPREHENSIVE METABOLIC PANEL
Albumin: 3.5 g/dL (ref 3.5–5.2)
Alkaline Phosphatase: 107 U/L (ref 39–117)
BUN: 17 mg/dL (ref 6–23)
CO2: 26 mEq/L (ref 19–32)
Calcium: 9.1 mg/dL (ref 8.4–10.5)
Chloride: 104 mEq/L (ref 96–112)
Creatinine, Ser: 1.3 mg/dL (ref 0.50–1.35)
GFR calc non Af Amer: 49 mL/min — ABNORMAL LOW (ref >90)
Potassium: 4.9 mEq/L (ref 3.5–5.1)
Sodium: 139 mEq/L (ref 135–145)
Total Bilirubin: 0.8 mg/dL (ref 0.3–1.2)

## 2013-11-08 LAB — CBC
HCT: 33.5 % — ABNORMAL LOW (ref 39.0–52.0)
Hemoglobin: 11.2 g/dL — ABNORMAL LOW (ref 13.0–17.0)
MCV: 94.9 fL (ref 78.0–100.0)
RBC: 3.53 MIL/uL — ABNORMAL LOW (ref 4.22–5.81)
RDW: 13 % (ref 11.5–15.5)
WBC: 8 10*3/uL (ref 4.0–10.5)

## 2013-11-08 LAB — PROTIME-INR: Prothrombin Time: 13.9 seconds (ref 11.6–15.2)

## 2013-11-08 LAB — POCT I-STAT TROPONIN I: Troponin i, poc: 0.01 ng/mL (ref 0.00–0.08)

## 2013-11-08 LAB — ETHANOL: Alcohol, Ethyl (B): <11 mg/dL (ref 0–11)

## 2013-11-08 MED ORDER — DILTIAZEM HCL 100 MG IV SOLR
5.0000 mg/h | INTRAVENOUS | Status: DC
  Administered 2013-11-08: 5 mg/h via INTRAVENOUS
  Administered 2013-11-09: 10 mg/h via INTRAVENOUS
  Filled 2013-11-08 (×2): qty 100

## 2013-11-08 MED ORDER — SODIUM CHLORIDE 0.9 % IJ SOLN
3.0000 mL | Freq: Two times a day (BID) | INTRAMUSCULAR | Status: DC
  Administered 2013-11-08 – 2013-11-10 (×3): 3 mL via INTRAVENOUS

## 2013-11-08 MED ORDER — ATORVASTATIN CALCIUM 40 MG PO TABS
40.0000 mg | ORAL_TABLET | Freq: Every day | ORAL | Status: DC
  Administered 2013-11-09: 40 mg via ORAL
  Filled 2013-11-08 (×2): qty 1

## 2013-11-08 MED ORDER — METOPROLOL TARTRATE 25 MG PO TABS
25.0000 mg | ORAL_TABLET | Freq: Two times a day (BID) | ORAL | Status: DC
  Administered 2013-11-08 – 2013-11-10 (×4): 25 mg via ORAL
  Filled 2013-11-08 (×5): qty 1

## 2013-11-08 MED ORDER — BUDESONIDE-FORMOTEROL FUMARATE 80-4.5 MCG/ACT IN AERO
2.0000 | INHALATION_SPRAY | Freq: Two times a day (BID) | RESPIRATORY_TRACT | Status: DC
  Administered 2013-11-09 – 2013-11-10 (×3): 2 via RESPIRATORY_TRACT
  Filled 2013-11-08: qty 6.9

## 2013-11-08 MED ORDER — HEPARIN SODIUM (PORCINE) 5000 UNIT/ML IJ SOLN
5000.0000 [IU] | Freq: Three times a day (TID) | INTRAMUSCULAR | Status: DC
  Administered 2013-11-08: 5000 [IU] via SUBCUTANEOUS
  Filled 2013-11-08 (×2): qty 1

## 2013-11-08 MED ORDER — HEPARIN (PORCINE) IN NACL 100-0.45 UNIT/ML-% IJ SOLN
1100.0000 [IU]/h | INTRAMUSCULAR | Status: AC
  Administered 2013-11-08: 1100 [IU]/h via INTRAVENOUS
  Filled 2013-11-08 (×2): qty 250

## 2013-11-08 MED ORDER — SERTRALINE HCL 25 MG PO TABS
25.0000 mg | ORAL_TABLET | Freq: Every day | ORAL | Status: DC
  Administered 2013-11-09 – 2013-11-10 (×2): 25 mg via ORAL
  Filled 2013-11-08 (×2): qty 1

## 2013-11-08 MED ORDER — TAMSULOSIN HCL 0.4 MG PO CAPS
0.4000 mg | ORAL_CAPSULE | Freq: Every day | ORAL | Status: DC
  Administered 2013-11-09 – 2013-11-10 (×2): 0.4 mg via ORAL
  Filled 2013-11-08 (×3): qty 1

## 2013-11-08 MED ORDER — ASPIRIN 81 MG PO CHEW
81.0000 mg | CHEWABLE_TABLET | Freq: Every day | ORAL | Status: DC
Start: 2013-11-08 — End: 2013-11-10
  Administered 2013-11-09 – 2013-11-10 (×2): 81 mg via ORAL
  Filled 2013-11-08 (×3): qty 1

## 2013-11-08 MED ORDER — NIACIN ER 500 MG PO CPCR
500.0000 mg | ORAL_CAPSULE | Freq: Every day | ORAL | Status: DC
  Administered 2013-11-09: 500 mg via ORAL
  Filled 2013-11-08 (×3): qty 1

## 2013-11-08 MED ORDER — TIOTROPIUM BROMIDE MONOHYDRATE 18 MCG IN CAPS
18.0000 ug | ORAL_CAPSULE | Freq: Every day | RESPIRATORY_TRACT | Status: DC
  Administered 2013-11-09 – 2013-11-10 (×2): 18 ug via RESPIRATORY_TRACT
  Filled 2013-11-08: qty 5

## 2013-11-08 MED ORDER — MIDODRINE HCL 5 MG PO TABS
5.0000 mg | ORAL_TABLET | Freq: Three times a day (TID) | ORAL | Status: DC
  Administered 2013-11-09 – 2013-11-10 (×5): 5 mg via ORAL
  Filled 2013-11-08 (×7): qty 1

## 2013-11-08 MED ORDER — DILTIAZEM LOAD VIA INFUSION
15.0000 mg | Freq: Once | INTRAVENOUS | Status: AC
  Administered 2013-11-08: 15 mg via INTRAVENOUS
  Filled 2013-11-08: qty 15

## 2013-11-08 MED ORDER — METOPROLOL TARTRATE 1 MG/ML IV SOLN
5.0000 mg | Freq: Once | INTRAVENOUS | Status: AC
  Administered 2013-11-08: 5 mg via INTRAVENOUS
  Filled 2013-11-08: qty 5

## 2013-11-08 MED ORDER — LORATADINE 10 MG PO TABS
10.0000 mg | ORAL_TABLET | Freq: Every day | ORAL | Status: DC
  Administered 2013-11-09 – 2013-11-10 (×2): 10 mg via ORAL
  Filled 2013-11-08 (×2): qty 1
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
## 2013-11-08 NOTE — Plan of Care (Signed)
Cardiology consult reviewed: patient felt to be in A.Flutter with 2:1 conduction, cardizem gtt ordered, NPO after midnight ordered, TSH ordered, and heparin gtt ordered.

## 2013-11-08 NOTE — ED Notes (Signed)
Patient transported to X-ray and CT 
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
## 2013-11-09 ENCOUNTER — Encounter (HOSPITAL_COMMUNITY)
Admission: EM | Disposition: A | Discharge status: Home / Self Care | Payer: Self-pay | Source: Home / Self Care | Attending: Internal Medicine

## 2013-11-09 ENCOUNTER — Inpatient Hospital Stay (HOSPITAL_COMMUNITY): Payer: Medicare Other

## 2013-11-09 ENCOUNTER — Encounter (HOSPITAL_COMMUNITY): Payer: Self-pay | Admitting: General Practice

## 2013-11-09 ENCOUNTER — Inpatient Hospital Stay (HOSPITAL_COMMUNITY): Payer: Medicare Other | Admitting: Certified Registered"

## 2013-11-09 ENCOUNTER — Encounter (HOSPITAL_COMMUNITY): Payer: Medicare Other | Admitting: Certified Registered"

## 2013-11-09 DIAGNOSIS — I4892 Unspecified atrial flutter: Secondary | ICD-10-CM | POA: Diagnosis not present

## 2013-11-09 DIAGNOSIS — J449 Chronic obstructive pulmonary disease, unspecified: Secondary | ICD-10-CM | POA: Diagnosis not present

## 2013-11-09 DIAGNOSIS — R55 Syncope and collapse: Secondary | ICD-10-CM | POA: Diagnosis not present

## 2013-11-09 DIAGNOSIS — Z951 Presence of aortocoronary bypass graft: Secondary | ICD-10-CM

## 2013-11-09 DIAGNOSIS — I4891 Unspecified atrial fibrillation: Secondary | ICD-10-CM | POA: Diagnosis not present

## 2013-11-09 DIAGNOSIS — D509 Iron deficiency anemia, unspecified: Secondary | ICD-10-CM | POA: Diagnosis not present

## 2013-11-09 DIAGNOSIS — R9431 Abnormal electrocardiogram [ECG] [EKG]: Secondary | ICD-10-CM | POA: Insufficient documentation

## 2013-11-09 DIAGNOSIS — I499 Cardiac arrhythmia, unspecified: Secondary | ICD-10-CM

## 2013-11-09 DIAGNOSIS — I471 Supraventricular tachycardia: Secondary | ICD-10-CM | POA: Diagnosis not present

## 2013-11-09 DIAGNOSIS — I2510 Atherosclerotic heart disease of native coronary artery without angina pectoris: Secondary | ICD-10-CM

## 2013-11-09 HISTORY — PX: CARDIOVERSION: SHX1299

## 2013-11-09 HISTORY — DX: Cardiac arrhythmia, unspecified: I49.9

## 2013-11-09 HISTORY — PX: TEE WITHOUT CARDIOVERSION: SHX5443

## 2013-11-09 LAB — TSH: TSH: 2.49 u[IU]/mL (ref 0.350–4.500)

## 2013-11-09 LAB — CBC
HCT: 31.8 % — ABNORMAL LOW (ref 39.0–52.0)
HCT: 32.5 % — ABNORMAL LOW (ref 39.0–52.0)
Hemoglobin: 10.4 g/dL — ABNORMAL LOW (ref 13.0–17.0)
Hemoglobin: 10.6 g/dL — ABNORMAL LOW (ref 13.0–17.0)
MCH: 31.4 pg (ref 26.0–34.0)
MCHC: 32.6 g/dL (ref 30.0–36.0)
MCV: 96.7 fL (ref 78.0–100.0)
Platelets: 178 10*3/uL (ref 150–400)
RBC: 3.29 MIL/uL — ABNORMAL LOW (ref 4.22–5.81)
RBC: 3.38 MIL/uL — ABNORMAL LOW (ref 4.22–5.81)
RDW: 13.2 % (ref 11.5–15.5)
WBC: 6.9 10*3/uL (ref 4.0–10.5)

## 2013-11-09 LAB — BASIC METABOLIC PANEL
BUN: 17 mg/dL (ref 6–23)
CO2: 25 mEq/L (ref 19–32)
Chloride: 105 mEq/L (ref 96–112)
Creatinine, Ser: 1.3 mg/dL (ref 0.50–1.35)
GFR calc Af Amer: 57 mL/min — ABNORMAL LOW (ref >90)
Glucose, Bld: 92 mg/dL (ref 70–99)
Potassium: 4.3 mEq/L (ref 3.5–5.1)

## 2013-11-09 LAB — HEPARIN LEVEL (UNFRACTIONATED): Heparin Unfractionated: 0.4 IU/mL (ref 0.30–0.70)

## 2013-11-09 SURGERY — ECHOCARDIOGRAM, TRANSESOPHAGEAL
Anesthesia: Moderate Sedation

## 2013-11-09 MED ORDER — FENTANYL CITRATE 0.05 MG/ML IJ SOLN
INTRAMUSCULAR | Status: AC
  Filled 2013-11-09: qty 2

## 2013-11-09 MED ORDER — APIXABAN 5 MG PO TABS
5.0000 mg | ORAL_TABLET | Freq: Two times a day (BID) | ORAL | Status: DC
  Administered 2013-11-09 – 2013-11-10 (×2): 5 mg via ORAL
  Filled 2013-11-09 (×4): qty 1

## 2013-11-09 MED ORDER — BUTAMBEN-TETRACAINE-BENZOCAINE 2-2-14 % EX AERO
INHALATION_SPRAY | CUTANEOUS | Status: DC | PRN
  Administered 2013-11-09: 2 via TOPICAL

## 2013-11-09 MED ORDER — LIDOCAINE VISCOUS 2 % MT SOLN
OROMUCOSAL | Status: DC | PRN
  Administered 2013-11-09: 10 mL via OROMUCOSAL

## 2013-11-09 MED ORDER — FENTANYL CITRATE 0.05 MG/ML IJ SOLN
INTRAMUSCULAR | Status: DC | PRN
  Administered 2013-11-09 (×2): 25 ug via INTRAVENOUS

## 2013-11-09 MED ORDER — MIDAZOLAM HCL 5 MG/ML IJ SOLN
INTRAMUSCULAR | Status: AC
  Filled 2013-11-09: qty 2

## 2013-11-09 MED ORDER — SODIUM CHLORIDE 0.9 % IV SOLN
INTRAVENOUS | Status: DC
  Administered 2013-11-09: 500 mL via INTRAVENOUS

## 2013-11-09 MED ORDER — SODIUM CHLORIDE 0.9 % IV SOLN: INTRAVENOUS | Status: DC

## 2013-11-09 MED ORDER — MIDAZOLAM HCL 10 MG/2ML IJ SOLN
INTRAMUSCULAR | Status: DC | PRN
  Administered 2013-11-09 (×2): 2 mg via INTRAVENOUS
  Administered 2013-11-09 (×2): 1 mg via INTRAVENOUS

## 2013-11-09 MED ORDER — PROPOFOL 10 MG/ML IV BOLUS
INTRAVENOUS | Status: DC | PRN
  Administered 2013-11-09: 20 mg via INTRAVENOUS

## 2013-11-09 MED ORDER — LIDOCAINE VISCOUS 2 % MT SOLN
OROMUCOSAL | Status: AC
  Filled 2013-11-09: qty 15

## 2013-11-09 NOTE — Progress Notes (Signed)
ANTICOAGULATION CONSULT NOTE - Follow Up Consult  Pharmacy Consult for Heparin Indication: atrial fibrillation  No Known Allergies  Patient Measurements: Height: 5\' 10"  (177.8 cm) Weight: 173 lb (78.472 kg) IBW/kg (Calculated) : 73 Heparin Dosing Weight:   Vital Signs: Temp: 98.4 F (36.9 C) (11/11 0514) Temp src: Oral (11/11 0514) BP: 108/67 mmHg (11/11 0807) Pulse Rate: 91 (11/11 0807)  Labs:  Recent Labs  11/08/13 1830 11/09/13 0446 11/09/13 0806  HGB 11.2* 10.4* 10.6*  HCT 33.5* 31.8* 32.5*  PLT 199 186 178  LABPROT 13.9  --   --   INR 1.09  --   --   HEPARINUNFRC  --   --  0.40  CREATININE 1.30 1.30  --     Estimated Creatinine Clearance: 45.2 ml/min (by C-G formula based on Cr of 1.3).   Medications:  Scheduled:  . aspirin  81 mg Oral Daily  . atorvastatin  40 mg Oral q1800  . budesonide-formoterol  2 puff Inhalation BID  . loratadine  10 mg Oral Daily  . metoprolol tartrate  25 mg Oral BID  . midodrine  5 mg Oral TID WC  . niacin  500 mg Oral QHS  . sertraline  25 mg Oral Daily  . sodium chloride  3 mL Intravenous Q12H  . tamsulosin  0.4 mg Oral Daily  . tiotropium  18 mcg Inhalation Daily    Assessment: 79yo male with AFlutter and syncopal episode.  Heparin level is within goal at 0.4 this AM, Hg and pltc are ~stable.  No bleeding problems noted.   Goal of Therapy:  Heparin level 0.3-0.7 units/ml Monitor platelets by anticoagulation protocol: Yes   Plan:  1.  Continue Heparin at current rate 2.  Repeat HL in 6hr to verify therapeutic  Marisue Humble, PharmD Clinical Pharmacist [facility]

## 2013-11-09 NOTE — Progress Notes (Addendum)
[facility]                                                                                Patient Demographics  [name], is a 79 y.o. male, DOB - 1929-03-30, ZOX:[id number]  Admit date - 11/08/2013   Admitting Physician Hillary Bow, DO  Outpatient Primary MD for the patient is Ailene Ravel, MD  LOS - 1   Chief Complaint  Patient presents with  . Loss of Consciousness        Assessment & Plan    1. Recurrent Syncope ( once few mths ago) - new found a flutter, cardiology following, he is currently on heparin drip on with Cardizem infusion per cardiology, echo TSH has been ordered, he is symptom free, since he has had 2 episodes of syncope I am also getting an EEG and carotid duplex . This episode happened while he was driving have requested him not to drive for the next few months he sees a neurologist and is cleared by them at that time.     2. Chronically low blood pressures. He is on midodrine at home which will be continued, will monitor orthostatics. Gentle IV fluids. Blood pressure hanging low. He is on combination of beta blocker and Cardizem we'll have to monitor closely. We'll deferred changing rate control agents to cardiology.    3. History of CAD. Stable no acute issues. Symptom-free. Provide supportive care. Continue aspirin, statin and beta blocker for secondary prevention.    4. COPD. No acute issues nebulizer treatments and oxygen as needed. No wheezing on exam    5. Dyslipidemia. On home dose statin. Continue.      Code Status: Full  Family Communication:  Called daughter on cell - no response 8.55 am  Disposition Plan: home   Procedures Echo ordered   Consults  Cards   Medications  Scheduled Meds: . aspirin  81 mg Oral Daily  . atorvastatin  40 mg Oral q1800  . budesonide-formoterol  2 puff Inhalation BID  . loratadine  10 mg Oral Daily  . metoprolol tartrate  25 mg Oral BID  . midodrine  5 mg Oral TID WC  .  niacin  500 mg Oral QHS  . sertraline  25 mg Oral Daily  . sodium chloride  3 mL Intravenous Q12H  . tamsulosin  0.4 mg Oral Daily  . tiotropium  18 mcg Inhalation Daily   Continuous Infusions: . diltiazem (CARDIZEM) infusion 10 mg/hr (11/09/13 0802)  . heparin 1,100 Units/hr (11/08/13 2347)   PRN Meds:.  DVT Prophylaxis   Heparin   Lab Results  Component Value Date   PLT 178 11/09/2013    Antibiotics     Anti-infectives   None          Subjective:   [name] today has, No headache, No chest pain, No abdominal pain - No Nausea, No new weakness tingling or numbness, No Cough - SOB.   Objective:   Filed Vitals:   11/08/13 2200 11/08/13 2241 11/09/13 0514 11/09/13 0807  BP: 120/80 146/96 101/55 108/67  Pulse: 120 124 97 91  Temp:  98.3 F (36.8 C) 98.4 F (36.9 C)  TempSrc:  Oral Oral   Resp: 19 22 18    Height:  5\' 10"  (1.778 m)    Weight:  78.472 kg (173 lb)    SpO2: 98% 99% 97%     Wt Readings from Last 3 Encounters:  11/08/13 78.472 kg (173 lb)  10/08/13 80.74 kg (178 lb)  10/07/13 80.74 kg (178 lb)     Intake/Output Summary (Last 24 hours) at 11/09/13 0858 Last data filed at 11/08/13 2328  Gross per 24 hour  Intake      3 ml  Output      0 ml  Net      3 ml    Exam Awake Alert, Oriented X 3, No new F.N deficits, Normal affect [place].AT,PERRAL Supple Neck,No JVD, No cervical lymphadenopathy appriciated.  Symmetrical Chest wall movement, Good air movement bilaterally, CTAB iRRR,No Gallops,Rubs or new Murmurs, No Parasternal Heave +ve B.Sounds, Abd Soft, Non tender, No organomegaly appriciated, No rebound - guarding or rigidity. No Cyanosis, Clubbing or edema, No new Rash or bruise      Data Review   Micro Results No results found for this or any previous visit (from the past 240 hour(s)).  Radiology Reports Dg Chest 2 View  11/08/2013   CLINICAL DATA:  COPD. Syncope.  EXAM: CHEST  2 VIEW  COMPARISON:  08/26/2013  FINDINGS: There is  hyperinflation of the lungs compatible with COPD. Prior CABG. No confluent airspace opacities. No effusions. No acute bony abnormality.  IMPRESSION: No active cardiopulmonary disease.   Electronically Signed   By: Charlett Nose M.D.   On: 11/08/2013 19:51   Ct Head Wo Contrast  11/08/2013   CLINICAL DATA:  Loss of consciousness  EXAM: CT HEAD WITHOUT CONTRAST  TECHNIQUE: Contiguous axial images were obtained from the base of the skull through the vertex without intravenous contrast.  COMPARISON:  10/03/2010  FINDINGS: The brain shows generalized atrophy. No sign of old or acute focal infarction, mass lesion, hemorrhage, hydrocephalus or extra-axial collection. There is been previous sinus surgery. No active sinus inflammation visible presently.  IMPRESSION: No acute finding. Age related atrophy.   Electronically Signed   By: Paulina Fusi M.D.   On: 11/08/2013 19:35    CBC  Recent Labs Lab 11/08/13 1830 11/09/13 0446 11/09/13 0806  WBC 8.0 6.9 5.9  HGB 11.2* 10.4* 10.6*  HCT 33.5* 31.8* 32.5*  PLT 199 186 178  MCV 94.9 96.7 96.2  MCH 31.7 31.6 31.4  MCHC 33.4 32.7 32.6  RDW 13.0 13.2 13.3    Chemistries   Recent Labs Lab 11/08/13 1830 11/09/13 0446  NA 139 140  K 4.9 4.3  CL 104 105  CO2 26 25  GLUCOSE 106* 92  BUN 17 17  CREATININE 1.30 1.30  CALCIUM 9.1 9.1  AST 29  --   ALT 32  --   ALKPHOS 107  --   BILITOT 0.8  --    ------------------------------------------------------------------------------------------------------------------ estimated creatinine clearance is 45.2 ml/min (by C-G formula based on Cr of 1.3). ------------------------------------------------------------------------------------------------------------------ No results found for this basename: HGBA1C,  in the last 72 hours ------------------------------------------------------------------------------------------------------------------ No results found for this basename: CHOL, HDL, LDLCALC, TRIG,  CHOLHDL, LDLDIRECT,  in the last 72 hours ------------------------------------------------------------------------------------------------------------------ No results found for this basename: TSH, T4TOTAL, FREET3, T3FREE, THYROIDAB,  in the last 72 hours ------------------------------------------------------------------------------------------------------------------ No results found for this basename: VITAMINB12, FOLATE, FERRITIN, TIBC, IRON, RETICCTPCT,  in the last 72 hours  Coagulation profile  Recent Labs Lab 11/08/13 1830  INR  1.09    No results found for this basename: DDIMER,  in the last 72 hours  Cardiac Enzymes No results found for this basename: CK, CKMB, TROPONINI, MYOGLOBIN,  in the last 168 hours ------------------------------------------------------------------------------------------------------------------ No components found with this basename: POCBNP,      Time Spent in minutes  35   Susa Raring K M.D on 11/09/2013 at 8:58 AM  Between 7am to 7pm - Pager - [phone]  After 7pm go to [email or web address] - password [email or web address]  And look for the night coverage person covering for me after hours  [facility] Office  [phone]

## 2013-11-09 NOTE — CV Procedure (Signed)
TEE/CARDIOVERSION NOTE  TRANSESOPHAGEAL ECHOCARDIOGRAM (TEE):  Indictation: Atrial Flutter  Consent:   Informed consent was obtained prior to the procedure. The risks, benefits and alternatives for the procedure were discussed and the patient comprehended these risks.  Risks include, but are not limited to, cough, sore throat, vomiting, nausea, somnolence, esophageal and stomach trauma or perforation, bleeding, low blood pressure, aspiration, pneumonia, infection, trauma to the teeth and death.    Time Out: Verified patient identification, verified procedure, site/side was marked, verified correct patient position, special equipment/implants available, medications/allergies/relevent history reviewed, required imaging and test results available. Performed  Procedure:  After a procedural time-out, the patient was given 6 mg versed and 50 mcg fentanyl for moderate sedation.  The oropharynx was anesthetized 10 cc of topical 1% viscous lidocaine and 2 cetacaine sprays.  The transesophageal probe was inserted in the esophagus and stomach without difficulty and multiple views were obtained.  The patient was kept under observation until the patient left the procedure room.  The patient left the procedure room in stable condition.   Agitated microbubble saline contrast was not administered.  Complications:    Complications: None Patient did tolerate procedure well.  Findings:  1. LEFT VENTRICLE: The left ventricular wall thickness is mildly increased.  The left ventricular cavity is normal in size. Wall motion is normal.  LVEF is 55%.  2. RIGHT VENTRICLE:  The right ventricle is normal in structure and function without any thrombus or masses.    3. LEFT ATRIUM:  The left atrium is dilated in size without any thrombus or masses.  There is not spontaneous echo contrast ("smoke") in the left atrium consistent with a low flow state.  4. LEFT ATRIAL APPENDAGE:  The left atrial appendage is  free of any thrombus or masses. The appendage has single lobes. Pulse doppler indicates moderate flow in the appendage.  5. ATRIAL SEPTUM:  The atrial septum appears intact and is free of thrombus and/or masses.  There is no evidence for interatrial shunting by color doppler and saline microbubble.  6. RIGHT ATRIUM:  The right atrium is normal in size and function without any thrombus or masses.  7. MITRAL VALVE:  The mitral valve is normal in structure and function with Mild regurgitation.  There were no vegetations or stenosis.  8. AORTIC VALVE:  The aortic valve is normal in structure and function with no regurgitation.  There were no vegetations or stenosis  9. TRICUSPID VALVE:  The tricuspid valve is normal in structure and function with Moderate regurgitation. RVSP is estimated to be 42 mmHg + RAP.  There were no vegetations or stenosis  10.  PULMONIC VALVE:  The pulmonic valve is normal in structure and function with trivial regurgitation.  There were no vegetations or stenosis.   11. AORTIC ARCH, ASCENDING AND DESCENDING AORTA:  There was grade 2 Myrtis Ser et. Al, 1992) atherosclerosis of the distal aortic arch and proximal descending aorta.  CARDIOVERSION:     Second Time Out: Verified patient identification, verified procedure, site/side was marked, verified correct patient position, special equipment/implants available, medications/allergies/relevent history reviewed, required imaging and test results available.  Performed  Procedure:  1. Patient placed on cardiac monitor, pulse oximetry, supplemental oxygen as necessary.  2. Sedation administered per anesthesia 3. Pacer pads placed anterior and posterior chest. 4. Cardioverted 1 time(s).  5. Cardioverted at 120J biphasic - successful cardioversion to sinus rhythm.  Complications:  Complications: None Patient did tolerate procedure well.  Impression:  1. No  LAA thrombus. 2. Normal LV function. 3.   Moderate TR with mild  pulmonary hypertension. 4.   Small amount of plaque in the aortic arch. 5.   Successful cardioversion x 1 shock (120 J biphasic) into sinus  Recommendations:  1. Start Eliquis per pharmacy for anticoagulation.  Monitor overnight. 2. Recommend outpatient 30 day monitor for additional arrhythmias or possible recurrence/development of a-fib.  Time Spent Directly with the Patient:  60 minutes   Chrystie Nose, MD, [facility] Attending Cardiologist [facility]  11/09/2013, 2:45 PM

## 2013-11-09 NOTE — Progress Notes (Signed)
DAILY PROGRESS NOTE  Subjective:  No complaints. Had EEG this am.  Telemetry clearly shows 4:1 atrial flutter which is counterclockwise, typical.  He is on heparin and cardizem.  Objective:  Temp:  [98.3 F (36.8 C)-98.9 F (37.2 C)] 98.4 F (36.9 C) (11/11 0514) Pulse Rate:  [91-124] 95 (11/11 0959) Resp:  [12-25] 18 (11/11 0514) BP: (101-146)/(55-96) 113/63 mmHg (11/11 0959) SpO2:  [93 %-100 %] 93 % (11/11 0909) Weight:  [173 lb (78.472 kg)] 173 lb (78.472 kg) (11/10 2241) Weight change:   Intake/Output from previous day: 11/10 0701 - 11/11 0700 In: 3 [I.V.:3] Out: -   Intake/Output from this shift:    Medications: Current Facility-Administered Medications  Medication Dose Route Frequency Provider Last Rate Last Dose  . aspirin chewable tablet 81 mg  81 mg Oral Daily Hillary Bow, DO   81 mg at 11/09/13 1610  . atorvastatin (LIPITOR) tablet 40 mg  40 mg Oral q1800 Hillary Bow, DO      . budesonide-formoterol [facility]) 80-4.5 MCG/ACT inhaler 2 puff  2 puff Inhalation BID Hillary Bow, DO   2 puff at 11/09/13 0908  . diltiazem (CARDIZEM) 100 mg in dextrose 5 % 100 mL infusion  5-15 mg/hr Intravenous Continuous Hillary Bow, DO 10 mL/hr at 11/09/13 0802 10 mg/hr at 11/09/13 0802  . heparin ADULT infusion 100 units/mL (25000 units/250 mL)  1,100 Units/hr Intravenous Continuous Colleen Can, [place] 11 mL/hr at 11/08/13 2347 1,100 Units/hr at 11/08/13 2347  . loratadine (CLARITIN) tablet 10 mg  10 mg Oral Daily Hillary Bow, DO   10 mg at 11/09/13 0959  . metoprolol tartrate (LOPRESSOR) tablet 25 mg  25 mg Oral BID Hillary Bow, DO   25 mg at 11/09/13 0959  . midodrine (PROAMATINE) tablet 5 mg  5 mg Oral TID WC Hillary Bow, DO   5 mg at 11/09/13 9604  . niacin CR capsule 500 mg  500 mg Oral QHS Hillary Bow, DO      . sertraline (ZOLOFT) tablet 25 mg  25 mg Oral Daily Hillary Bow, DO   25 mg at 11/09/13 0959  . sodium chloride 0.9 %  injection 3 mL  3 mL Intravenous Q12H Hillary Bow, DO   3 mL at 11/08/13 2328  . tamsulosin (FLOMAX) capsule 0.4 mg  0.4 mg Oral Daily Hillary Bow, DO   0.4 mg at 11/09/13 1000  . tiotropium (SPIRIVA) inhalation capsule 18 mcg  18 mcg Inhalation Daily Hillary Bow, DO   18 mcg at 11/09/13 0908    Physical Exam: General appearance: alert and no distress Neck: no carotid bruit and no JVD Lungs: clear to auscultation bilaterally Heart: regular rate and rhythm, S1, S2 normal, no murmur, click, rub or gallop Abdomen: soft, non-tender; bowel sounds normal; no masses,  no organomegaly Extremities: extremities normal, atraumatic, no cyanosis or edema Pulses: 2+ and symmetric Skin: Skin color, texture, turgor normal. No rashes or lesions Neurologic: Grossly normal  Lab Results: Results for orders placed during the hospital encounter of 11/08/13 (from the past 48 hour(s))  CBC     Status: Abnormal   Collection Time    11/08/13  6:30 PM      Result Value Range   WBC 8.0  4.0 - 10.5 K/uL   RBC 3.53 (*) 4.22 - 5.81 MIL/uL   Hemoglobin 11.2 (*) 13.0 - 17.0 g/dL   HCT 54.0 (*) 98.1 - 19.1 %  MCV 94.9  78.0 - 100.0 fL   MCH 31.7  26.0 - 34.0 pg   MCHC 33.4  30.0 - 36.0 g/dL   RDW 16.1  09.6 - 04.5 %   Platelets 199  150 - 400 K/uL  COMPREHENSIVE METABOLIC PANEL     Status: Abnormal   Collection Time    11/08/13  6:30 PM      Result Value Range   Sodium 139  135 - 145 mEq/L   Potassium 4.9  3.5 - 5.1 mEq/L   Chloride 104  96 - 112 mEq/L   CO2 26  19 - 32 mEq/L   Glucose, Bld 106 (*) 70 - 99 mg/dL   BUN 17  6 - 23 mg/dL   Creatinine, Ser 4.09  0.50 - 1.35 mg/dL   Calcium 9.1  8.4 - 81.1 mg/dL   Total Protein 7.0  6.0 - 8.3 g/dL   Albumin 3.5  3.5 - 5.2 g/dL   AST 29  0 - 37 U/L   ALT 32  0 - 53 U/L   Alkaline Phosphatase 107  39 - 117 U/L   Total Bilirubin 0.8  0.3 - 1.2 mg/dL   GFR calc non Af Amer 49 (*) >90 mL/min   GFR calc Af Amer 57 (*) >90 mL/min   Comment: (NOTE)       The eGFR has been calculated using the CKD EPI equation.     This calculation has not been validated in all clinical situations.     eGFR's persistently <90 mL/min signify possible Chronic Kidney     Disease.  ETHANOL     Status: None   Collection Time    11/08/13  6:30 PM      Result Value Range   Alcohol, Ethyl (B) <11  0 - 11 mg/dL   Comment:            LOWEST DETECTABLE LIMIT FOR     SERUM ALCOHOL IS 11 mg/dL     FOR MEDICAL PURPOSES ONLY  PROTIME-INR     Status: None   Collection Time    11/08/13  6:30 PM      Result Value Range   Prothrombin Time 13.9  11.6 - 15.2 seconds   INR 1.09  0.00 - 1.49  POCT I-STAT TROPONIN I     Status: None   Collection Time    11/08/13  6:41 PM      Result Value Range   Troponin i, poc 0.01  0.00 - 0.08 ng/mL   Comment 3            Comment: Due to the release kinetics of cTnI,     a negative result within the first hours     of the onset of symptoms does not rule out     myocardial infarction with certainty.     If myocardial infarction is still suspected,     repeat the test at appropriate intervals.  CBC     Status: Abnormal   Collection Time    11/09/13  4:46 AM      Result Value Range   WBC 6.9  4.0 - 10.5 K/uL   RBC 3.29 (*) 4.22 - 5.81 MIL/uL   Hemoglobin 10.4 (*) 13.0 - 17.0 g/dL   HCT 91.4 (*) 78.2 - 95.6 %   MCV 96.7  78.0 - 100.0 fL   MCH 31.6  26.0 - 34.0 pg   MCHC 32.7  30.0 - 36.0 g/dL  RDW 13.2  11.5 - 15.5 %   Platelets 186  150 - 400 K/uL  BASIC METABOLIC PANEL     Status: Abnormal   Collection Time    11/09/13  4:46 AM      Result Value Range   Sodium 140  135 - 145 mEq/L   Potassium 4.3  3.5 - 5.1 mEq/L   Chloride 105  96 - 112 mEq/L   CO2 25  19 - 32 mEq/L   Glucose, Bld 92  70 - 99 mg/dL   BUN 17  6 - 23 mg/dL   Creatinine, Ser 1.61  0.50 - 1.35 mg/dL   Calcium 9.1  8.4 - 09.6 mg/dL   GFR calc non Af Amer 49 (*) >90 mL/min   GFR calc Af Amer 57 (*) >90 mL/min   Comment: (NOTE)     The eGFR has  been calculated using the CKD EPI equation.     This calculation has not been validated in all clinical situations.     eGFR's persistently <90 mL/min signify possible Chronic Kidney     Disease.  HEPARIN LEVEL (UNFRACTIONATED)     Status: None   Collection Time    11/09/13  8:06 AM      Result Value Range   Heparin Unfractionated 0.40  0.30 - 0.70 IU/mL   Comment:            IF HEPARIN RESULTS ARE BELOW     EXPECTED VALUES, AND PATIENT     DOSAGE HAS BEEN CONFIRMED,     SUGGEST FOLLOW UP TESTING     OF ANTITHROMBIN III LEVELS.  CBC     Status: Abnormal   Collection Time    11/09/13  8:06 AM      Result Value Range   WBC 5.9  4.0 - 10.5 K/uL   RBC 3.38 (*) 4.22 - 5.81 MIL/uL   Hemoglobin 10.6 (*) 13.0 - 17.0 g/dL   HCT 04.5 (*) 40.9 - 81.1 %   MCV 96.2  78.0 - 100.0 fL   MCH 31.4  26.0 - 34.0 pg   MCHC 32.6  30.0 - 36.0 g/dL   RDW 91.4  78.2 - 95.6 %   Platelets 178  150 - 400 K/uL    Imaging: Dg Chest 2 View  11/08/2013   CLINICAL DATA:  COPD. Syncope.  EXAM: CHEST  2 VIEW  COMPARISON:  08/26/2013  FINDINGS: There is hyperinflation of the lungs compatible with COPD. Prior CABG. No confluent airspace opacities. No effusions. No acute bony abnormality.  IMPRESSION: No active cardiopulmonary disease.   Electronically Signed   By: Charlett Nose M.D.   On: 11/08/2013 19:51   Ct Head Wo Contrast  11/08/2013   CLINICAL DATA:  Loss of consciousness  EXAM: CT HEAD WITHOUT CONTRAST  TECHNIQUE: Contiguous axial images were obtained from the base of the skull through the vertex without intravenous contrast.  COMPARISON:  10/03/2010  FINDINGS: The brain shows generalized atrophy. No sign of old or acute focal infarction, mass lesion, hemorrhage, hydrocephalus or extra-axial collection. There is been previous sinus surgery. No active sinus inflammation visible presently.  IMPRESSION: No acute finding. Age related atrophy.   Electronically Signed   By: Paulina Fusi M.D.   On: 11/08/2013 19:35     Assessment:  Principal Problem:   Syncope Active Problems:   CORONARY HEART DISEASE   Atrial flutter   Coronary heart disease   S/P CABG x 4   Plan:  1. Discussed possible TEE/DCCV today with Mr. Staples, he is agreeable to this. Will try to arrange with endoscopy and anesthesia. Keep NPO on heparin and cardizem. He may need long-term anti-arrythymic therapy. I would recommend outpatient 30 day monitor if he successfully converts to look for re-occurrence or a-fib. If no a-fib, he may also be an ablation candidate.  Time Spent Directly with Patient:  15 minutes  Length of Stay:  LOS: 1 day   Chrystie Nose, MD, [facility] Attending Cardiologist [facility]  Tyishia Aune C 11/09/2013, 11:26 AM

## 2013-11-09 NOTE — Progress Notes (Signed)
SBP 70's- 80's patient on cardizem drip at 10 mg/hr Dr Rennis Golden called and  made aware , with verbal order to discontinue cardizem drip

## 2013-11-09 NOTE — Progress Notes (Signed)
UR COMPLETED.   Patient changed to IP status r/t continuing need for monitoring and  IV heparin gtt and IV Cardizem gtt's.

## 2013-11-09 NOTE — H&P (Signed)
     INTERVAL PROCEDURE H&P  History and Physical Interval Note:  11/09/2013 12:04 PM  [name] has presented today for their planned procedure. The various methods of treatment have been discussed with the patient and family. After consideration of risks, benefits and other options for treatment, the patient has consented to the procedure.  The patients' outpatient history has been reviewed, patient examined, and no change in status from most recent office note within the past 30 days. I have reviewed the patients' chart and labs and will proceed as planned. Questions were answered to the patient's satisfaction.   Lance Nose, MD, [facility] Attending Cardiologist [facility]  [name] 11/09/2013, 12:04 PM

## 2013-11-09 NOTE — Procedures (Signed)
EEG report.  Brief clinical history: 79 y/o with recurrent syncope ( once few mths ago) : .  Technique: this is a 17 channel routine scalp EEG performed at the bedside with bipolar and monopolar montages arranged in accordance to the international 10/20 system of electrode placement. One channel was dedicated to EKG recording.  The study was performed during wakefulness and drowsiness No activating procedures performed.  Description:In the wakeful state, the best background consisted of a medium amplitude, posterior dominant, well sustained, symmetric and reactive 10 Hz rhythm. Drowsiness demonstrated dropout of the alpha rhythm. No focal or generalized epileptiform discharges noted.  No slowing seen.  EKG showed sinus rhythm.  Impression: this is a normal awake and drowsy EEG. Please, be aware that a normal EEG does not exclude the possibility of epilepsy.  Clinical correlation is advised.  Wyatt Portela, MD

## 2013-11-09 NOTE — Anesthesia Preprocedure Evaluation (Signed)
Anesthesia Evaluation  Patient identified by MRN, date of birth, ID bandGeneral Assessment Comment:Patient sedated for TEE  Reviewed: Allergy & Precautions, H&P , NPO status , Patient's Chart, lab work & pertinent test results, reviewed documented beta blocker date and time   History of Anesthesia Complications Negative for: history of anesthetic complications  Airway Mallampati: II      Dental  (+) Edentulous Upper and Edentulous Lower   Pulmonary COPDformer smoker,  breath sounds clear to auscultation  Pulmonary exam normal       Cardiovascular + CAD, + CABG and + Peripheral Vascular Disease + dysrhythmias Atrial Fibrillation Rhythm:Irregular Rate:Normal  TEE today: LVF normal, mod TR   Neuro/Psych    GI/Hepatic negative GI ROS, Neg liver ROS,   Endo/Other  negative endocrine ROS  Renal/GU Renal InsufficiencyRenal disease (creat 1.30)     Musculoskeletal   Abdominal   Peds  Hematology   Anesthesia Other Findings   Reproductive/Obstetrics                           Anesthesia Physical Anesthesia Plan  ASA: III  Anesthesia Plan: General   Post-op Pain Management:    Induction: Intravenous  Airway Management Planned: Mask  Additional Equipment:   Intra-op Plan:   Post-operative Plan:   Informed Consent: I have reviewed the patients History and Physical, chart, labs and discussed the procedure including the risks, benefits and alternatives for the proposed anesthesia with the patient or authorized representative who has indicated his/her understanding and acceptance.     Plan Discussed with: CRNA and Surgeon  Anesthesia Plan Comments: (Plan routine monitors, GA for cardioversion)        Anesthesia Quick Evaluation

## 2013-11-09 NOTE — Transfer of Care (Signed)
Immediate Anesthesia Transfer of Care Note  Patient: [name]  Procedure(s) Performed: Procedure(s): TRANSESOPHAGEAL ECHOCARDIOGRAM (TEE) (N/A) CARDIOVERSION (N/A)  Patient Location: PACU  Anesthesia Type:General  Level of Consciousness: sedated, patient cooperative and responds to stimulation  Airway & Oxygen Therapy: Patient Spontanous Breathing and Patient connected to nasal cannula oxygen  Post-op Assessment: Report given to PACU RN, Post -op Vital signs reviewed and stable and Patient moving all extremities X 4  Post vital signs: Reviewed and stable  Complications: No apparent anesthesia complications

## 2013-11-09 NOTE — Progress Notes (Signed)
EEG completed; results pending.    

## 2013-11-09 NOTE — Progress Notes (Signed)
ANTICOAGULATION CONSULT NOTE - Follow Up Consult  Pharmacy Consult for Apixaban Indication: Aflutter  No Known Allergies  Patient Measurements: Height: 5\' 10"  (177.8 cm) Weight: 173 lb (78.472 kg) IBW/kg (Calculated) : 73 Heparin Dosing Weight:   Vital Signs: Temp: 97.7 F (36.5 C) (11/11 1443) Temp src: Oral (11/11 1443) BP: 95/63 mmHg (11/11 1540) Pulse Rate: 66 (11/11 1540)  Labs:  Recent Labs  11/08/13 1830 11/09/13 0446 11/09/13 0806  HGB 11.2* 10.4* 10.6*  HCT 33.5* 31.8* 32.5*  PLT 199 186 178  LABPROT 13.9  --   --   INR 1.09  --   --   HEPARINUNFRC  --   --  0.40  CREATININE 1.30 1.30  --     Estimated Creatinine Clearance: 45.2 ml/min (by C-G formula based on Cr of 1.3).   Medications:  Heparin 1100 units/hr  Assessment: 79yom on heparin for aflutter. Patient had successful cardioversion today and now pharmacy has been consulted to transition patient to apixaban. - H/H and Plts stable - No significant bleeding reported - Weight: 79kg, SCr 1.3, Age 79 - LFTs wnl    Plan:  1. Start Apixaban 5mg  BID - first dose ~1800 tonight 2. Stop heparin drip at time of first apixaban dose  Cleon Dew [phone] 11/09/2013,5:16 PM

## 2013-11-09 NOTE — Progress Notes (Signed)
Echocardiogram Echocardiogram Transesophageal has been performed.  [name] 11/09/2013, 2:38 PM

## 2013-11-09 NOTE — Anesthesia Postprocedure Evaluation (Signed)
  Anesthesia Post-op Note  Patient: [name]  Procedure(s) Performed: Procedure(s): TRANSESOPHAGEAL ECHOCARDIOGRAM (TEE) (N/A) CARDIOVERSION (N/A)  Patient Location: PACU  Anesthesia Type:General  Level of Consciousness: awake, alert , oriented and patient cooperative  Airway and Oxygen Therapy: Patient Spontanous Breathing and Patient connected to nasal cannula oxygen  Post-op Pain: none  Post-op Assessment: Post-op Vital signs reviewed, Patient's Cardiovascular Status Stable, Respiratory Function Stable, Patent Airway, No signs of Nausea or vomiting and Pain level controlled  Post-op Vital Signs: Reviewed and stable  Complications: No apparent anesthesia complications

## 2013-11-10 ENCOUNTER — Encounter (HOSPITAL_COMMUNITY): Payer: Self-pay | Admitting: Internal Medicine

## 2013-11-10 ENCOUNTER — Other Ambulatory Visit: Payer: Self-pay | Admitting: Cardiology

## 2013-11-10 DIAGNOSIS — R55 Syncope and collapse: Secondary | ICD-10-CM | POA: Diagnosis not present

## 2013-11-10 DIAGNOSIS — I4892 Unspecified atrial flutter: Secondary | ICD-10-CM | POA: Diagnosis not present

## 2013-11-10 DIAGNOSIS — I480 Paroxysmal atrial fibrillation: Secondary | ICD-10-CM

## 2013-11-10 DIAGNOSIS — J449 Chronic obstructive pulmonary disease, unspecified: Secondary | ICD-10-CM | POA: Diagnosis not present

## 2013-11-10 LAB — BASIC METABOLIC PANEL
BUN: 19 mg/dL (ref 6–23)
CO2: 24 mEq/L (ref 19–32)
Calcium: 8.7 mg/dL (ref 8.4–10.5)
Chloride: 105 mEq/L (ref 96–112)
Creatinine, Ser: 1.35 mg/dL (ref 0.50–1.35)
Glucose, Bld: 103 mg/dL — ABNORMAL HIGH (ref 70–99)
Potassium: 4.5 mEq/L (ref 3.5–5.1)

## 2013-11-10 MED ORDER — APIXABAN 5 MG PO TABS: 5.0000 mg | ORAL_TABLET | Freq: Two times a day (BID) | ORAL | Status: DC

## 2013-11-10 NOTE — Care Management (Signed)
1216 11-10-13 Case Manager provided pt with 30 day free eliquis card. Pt will need prior authorization for medication. CM did send a text to Dr. B to call for authorization at [phone]. No further needs from CM at this time. Gala Lewandowsky, RN,BSN [id number]

## 2013-11-10 NOTE — Discharge Summary (Signed)
Physician Discharge Summary  [name] ZOX:[id number] DOB: 06-16-1929 DOA: 11/08/2013  PCP: Ailene Ravel, MD  Admit date: 11/08/2013 Discharge date: 11/10/2013  Time spent:  minutes  Recommendations for Outpatient Follow-up:  F/u with cardiology for event monitor; defer to cardiology  F/u with PCP next week   Discharge Diagnoses:  Principal Problem:   Syncope Active Problems:   CORONARY HEART DISEASE   Atrial flutter   Coronary heart disease   S/P CABG x 4   Discharge Condition: stable   Diet recommendation: heart healthy   Filed Weights   11/08/13 2241  Weight: 78.472 kg (173 lb)    History of present illness/Hospital Course:  79 y/o male with PMH of CAD s/p CABG, autonomic dysfunction, orthostatism on midodrine, COPD presented with syncope found to have a fib RVR;  -patient underwent successful TEE/DCCV and started on eliquis per cardiology, defer to cardiology outpatient event monitor; -patient had no new symptoms, CT head, EEG unremarkable;  He is ambulating no ataxia;'    Procedures:  TEE/DCCV (i.e. Studies not automatically included, echos, thoracentesis, etc; not x-rays)  Consultations:  cardiology  Discharge Exam: Filed Vitals:   11/10/13 0549  BP: 147/66  Pulse:   Temp:   Resp:     General: alert Cardiovascular: s1,s2 rrr Respiratory: CTA BL   Discharge Instructions  Discharge Orders   Future Appointments Provider Department Dept Phone   11/22/2013 2:30 PM Lennette Bihari, MD [facility] [phone]   04/08/2014 1:15 PM Rachael Fee [facility] [phone]   04/08/2014 1:45 PM Josph Macho, MD [facility] [phone]   04/08/2014 2:15 PM [facility]  [facility] [phone]   Future Orders Complete By Expires   Diet - low sodium heart healthy  As directed    Discharge instructions  As directed    Comments:     Please  follow up with cardiologist to scheduled heart monitor next week   Increase activity slowly  As directed        Medication List         apixaban 5 MG Tabs tablet  Commonly known as:  ELIQUIS  Take 1 tablet (5 mg total) by mouth every 12 (twelve) hours.     aspirin 81 MG tablet  Take 81 mg by mouth daily.     atorvastatin 40 MG tablet  Commonly known as:  LIPITOR  TAKE 1 TABLET BY MOUTH EVERY DAY     [employer] SENIOR PROBIOTIC PO  Once daily     [employer]S SPECTRAVITE SENIOR Tabs  Take 1 tablet by mouth daily.     diphenhydramine-acetaminophen 25-500 MG Tabs  Commonly known as:  TYLENOL PM  Take 1 tablet by mouth at bedtime as needed.     loratadine 10 MG tablet  Commonly known as:  CLARITIN  Take 10 mg by mouth daily.     metoprolol tartrate 25 MG tablet  Commonly known as:  LOPRESSOR  Take 1 tablet (25 mg total) by mouth 2 (two) times daily.     midodrine 5 MG tablet  Commonly known as:  PROAMATINE  Take 1 tablet (5 mg total) by mouth 3 (three) times daily. One by mouth three times daily     niacin 500 MG CR tablet  Commonly known as:  NIASPAN  Take 1 tablet (500 mg total) by mouth at bedtime.     nitroGLYCERIN 0.4 MG SL  tablet  Commonly known as:  NITROSTAT  Place 0.4 mg under the tongue every 5 (five) minutes as needed.     sertraline 25 MG tablet  Commonly known as:  ZOLOFT  Take 1 tablet (25 mg total) by mouth daily. Once daily     SPIRIVA HANDIHALER 18 MCG inhalation capsule  Generic drug:  tiotropium  PLACE 1 CAPSULE (18 MCG TOTAL) INTO INHALER AND INHALE DAILY.     SYMBICORT 80-4.5 MCG/ACT inhaler  Generic drug:  budesonide-formoterol  INHALE 2 PUFFS INTO THE LUNGS 2 (TWO) TIMES DAILY.     tamsulosin 0.4 MG Caps capsule  Commonly known as:  FLOMAX  Take 0.4 mg by mouth daily.       No Known Allergies     Follow-up Information   Follow up with HILTY,Kenneth C, MD In 1 week.   Specialty:  Cardiology   Contact information:   [address] [place]  [address] [place] [address] [phone]        The results of significant diagnostics from this hospitalization (including imaging, microbiology, ancillary and laboratory) are listed below for reference.    Significant Diagnostic Studies: Dg Chest 2 View  11/08/2013   CLINICAL DATA:  COPD. Syncope.  EXAM: CHEST  2 VIEW  COMPARISON:  08/26/2013  FINDINGS: There is hyperinflation of the lungs compatible with COPD. Prior CABG. No confluent airspace opacities. No effusions. No acute bony abnormality.  IMPRESSION: No active cardiopulmonary disease.   Electronically Signed   By: Charlett Nose M.D.   On: 11/08/2013 19:51   Ct Head Wo Contrast  11/08/2013   CLINICAL DATA:  Loss of consciousness  EXAM: CT HEAD WITHOUT CONTRAST  TECHNIQUE: Contiguous axial images were obtained from the base of the skull through the vertex without intravenous contrast.  COMPARISON:  10/03/2010  FINDINGS: The brain shows generalized atrophy. No sign of old or acute focal infarction, mass lesion, hemorrhage, hydrocephalus or extra-axial collection. There is been previous sinus surgery. No active sinus inflammation visible presently.  IMPRESSION: No acute finding. Age related atrophy.   Electronically Signed   By: Paulina Fusi M.D.   On: 11/08/2013 19:35    Microbiology: No results found for this or any previous visit (from the past 240 hour(s)).   Labs: Basic Metabolic Panel:  Recent Labs Lab 11/08/13 1830 11/09/13 0446 11/10/13 0700  NA 139 140 139  K 4.9 4.3 4.5  CL 104 105 105  CO2 26 25 24   GLUCOSE 106* 92 103*  BUN 17 17 19   CREATININE 1.30 1.30 1.35  CALCIUM 9.1 9.1 8.7   Liver Function Tests:  Recent Labs Lab 11/08/13 1830  AST 29  ALT 32  ALKPHOS 107  BILITOT 0.8  PROT 7.0  ALBUMIN 3.5   No results found for this basename: LIPASE, AMYLASE,  in the last 168 hours No results found for this basename: AMMONIA,  in the last 168 hours CBC:  Recent Labs Lab 11/08/13 1830 11/09/13 0446  11/09/13 0806  WBC 8.0 6.9 5.9  HGB 11.2* 10.4* 10.6*  HCT 33.5* 31.8* 32.5*  MCV 94.9 96.7 96.2  PLT 199 186 178   Cardiac Enzymes: No results found for this basename: CKTOTAL, CKMB, CKMBINDEX, TROPONINI,  in the last 168 hours BNP: BNP (last 3 results) No results found for this basename: PROBNP,  in the last 8760 hours CBG: No results found for this basename: GLUCAP,  in the last 168 hours     Signed:  Esperanza Sheets  [facility] 11/10/2013, 11:45 AM

## 2013-11-12 ENCOUNTER — Other Ambulatory Visit: Payer: Self-pay | Admitting: *Deleted

## 2013-11-12 DIAGNOSIS — R55 Syncope and collapse: Secondary | ICD-10-CM

## 2013-11-22 ENCOUNTER — Ambulatory Visit (INDEPENDENT_AMBULATORY_CARE_PROVIDER_SITE_OTHER): Payer: Medicare Other | Admitting: Cardiovascular Disease

## 2013-11-22 VITALS — BP 122/70 | HR 73

## 2013-11-22 DIAGNOSIS — R55 Syncope and collapse: Secondary | ICD-10-CM | POA: Diagnosis not present

## 2013-11-22 DIAGNOSIS — Z951 Presence of aortocoronary bypass graft: Secondary | ICD-10-CM | POA: Diagnosis not present

## 2013-11-22 DIAGNOSIS — J438 Other emphysema: Secondary | ICD-10-CM

## 2013-11-22 DIAGNOSIS — J449 Chronic obstructive pulmonary disease, unspecified: Secondary | ICD-10-CM

## 2013-11-22 DIAGNOSIS — I259 Chronic ischemic heart disease, unspecified: Secondary | ICD-10-CM | POA: Diagnosis not present

## 2013-11-22 DIAGNOSIS — I2510 Atherosclerotic heart disease of native coronary artery without angina pectoris: Secondary | ICD-10-CM | POA: Diagnosis not present

## 2013-11-22 DIAGNOSIS — I4892 Unspecified atrial flutter: Secondary | ICD-10-CM

## 2013-11-22 NOTE — Patient Instructions (Signed)
Your physician recommends that you schedule a follow-up appointment in: with Dr C for consultation for a loop recorder

## 2013-11-24 ENCOUNTER — Ambulatory Visit (INDEPENDENT_AMBULATORY_CARE_PROVIDER_SITE_OTHER): Payer: Medicare Other | Admitting: Cardiovascular Disease

## 2013-11-24 ENCOUNTER — Encounter: Payer: Self-pay | Admitting: Cardiovascular Disease

## 2013-11-24 VITALS — BP 118/62 | HR 68 | Ht 70.0 in | Wt 175.7 lb

## 2013-11-24 DIAGNOSIS — R55 Syncope and collapse: Secondary | ICD-10-CM | POA: Diagnosis not present

## 2013-11-24 NOTE — Patient Instructions (Signed)
Dr. Royann Shivers wants you to have a Loop recorder implanted.  This is done at [facility] as an outpatient.

## 2013-11-25 ENCOUNTER — Encounter: Payer: Self-pay | Admitting: Cardiovascular Disease
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
## 2013-11-26 ENCOUNTER — Encounter (HOSPITAL_COMMUNITY): Payer: Self-pay | Admitting: Pharmacy Technician
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
## 2013-11-28 NOTE — Assessment & Plan Note (Signed)
Implantable loop recorder is appropriate.  This procedure has been fully reviewed with the patient and written informed consent has been obtained.  He should not drive for at least 6 months since his last syncopal event.

## 2013-11-29 ENCOUNTER — Encounter: Payer: Self-pay | Admitting: Cardiovascular Disease

## 2013-11-30 ENCOUNTER — Encounter (HOSPITAL_COMMUNITY)
Admission: RE | Disposition: A | Discharge status: Home / Self Care | Payer: Self-pay | Source: Ambulatory Visit | Attending: Cardiovascular Disease

## 2013-11-30 ENCOUNTER — Ambulatory Visit (HOSPITAL_COMMUNITY)
Admission: RE | Admit: 2013-11-30 | Discharge: 2013-11-30 | Disposition: A | Discharge status: Home / Self Care | Payer: Medicare Other | Source: Ambulatory Visit | Attending: Cardiovascular Disease | Admitting: Cardiovascular Disease

## 2013-11-30 DIAGNOSIS — Z951 Presence of aortocoronary bypass graft: Secondary | ICD-10-CM | POA: Insufficient documentation

## 2013-11-30 DIAGNOSIS — J4489 Other specified chronic obstructive pulmonary disease: Secondary | ICD-10-CM | POA: Insufficient documentation

## 2013-11-30 DIAGNOSIS — D509 Iron deficiency anemia, unspecified: Secondary | ICD-10-CM | POA: Insufficient documentation

## 2013-11-30 DIAGNOSIS — J449 Chronic obstructive pulmonary disease, unspecified: Secondary | ICD-10-CM | POA: Insufficient documentation

## 2013-11-30 DIAGNOSIS — Z79899 Other long term (current) drug therapy: Secondary | ICD-10-CM | POA: Insufficient documentation

## 2013-11-30 DIAGNOSIS — I672 Cerebral atherosclerosis: Secondary | ICD-10-CM | POA: Insufficient documentation

## 2013-11-30 DIAGNOSIS — R55 Syncope and collapse: Secondary | ICD-10-CM | POA: Insufficient documentation

## 2013-11-30 DIAGNOSIS — I2510 Atherosclerotic heart disease of native coronary artery without angina pectoris: Secondary | ICD-10-CM | POA: Diagnosis not present

## 2013-11-30 DIAGNOSIS — Z7901 Long term (current) use of anticoagulants: Secondary | ICD-10-CM | POA: Diagnosis not present

## 2013-11-30 DIAGNOSIS — Z87891 Personal history of nicotine dependence: Secondary | ICD-10-CM | POA: Insufficient documentation

## 2013-11-30 DIAGNOSIS — D472 Monoclonal gammopathy: Secondary | ICD-10-CM | POA: Insufficient documentation

## 2013-11-30 HISTORY — PX: LOOP RECORDER IMPLANT: SHX5477

## 2013-11-30 SURGERY — LOOP RECORDER IMPLANT
Anesthesia: LOCAL

## 2013-11-30 MED ORDER — LIDOCAINE HCL (PF) 1 % IJ SOLN
INTRAMUSCULAR | Status: AC
  Filled 2013-11-30: qty 30

## 2013-11-30 MED ORDER — LIDOCAINE-EPINEPHRINE 1 %-1:100000 IJ SOLN
INTRAMUSCULAR | Status: AC
  Filled 2013-11-30: qty 1

## 2013-11-30 NOTE — Interval H&P Note (Signed)
History and Physical Interval Note:  11/30/2013 1:01 PM  [name]  has presented today for surgery, with the diagnosis of Syncope  The various methods of treatment have been discussed with the patient and family. After consideration of risks, benefits and other options for treatment, the patient has consented to  Procedure(s): LOOP RECORDER IMPLANT (N/A) as a surgical intervention .  The patient's history has been reviewed, patient examined, no change in status, stable for surgery.  I have reviewed the patient's chart and labs.  Questions were answered to the patient's satisfaction.     Alroy Portela
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
## 2013-11-30 NOTE — CV Procedure (Addendum)
LOOP RECORDER IMPLANT   Procedure report  Procedure performed:  1. Loop recorder implantation  2. Light sedation  Reason for procedure:  1. Recurrent syncope Procedure performed by:  Thurmon Fair, MD  Complications:  None  Estimated blood loss:  <5 mL  Medications administered during procedure:  Lidocaine w epi 1% 10 mL locally; no sedation was administered in the lab  Device details:  Medtronic Reveal Linq model number [id number], serial number [id number] Procedure details:  After the risks and benefits of the procedure were discussed the patient provided informed consent. He was brought to the short stay area in the fasting state. The patient was prepped and draped in usual sterile fashion. Local anesthesia with 1% lidocaine was administered to an area 2 cm to the left of the sternum in the 4th intercostal space. A horizontal incision was made using the incision tool. The introducer was then used to create a subcutaneous tunnel and carefully deploy the device. Local pressure was held to ensure hemostasis.  The incision was closed with SteriStrips and a sterile dressing was applied.   Thurmon Fair, MD, [facility] [phone] office [phone] pager 11/30/2013 2:22 PM

## 2013-12-07 ENCOUNTER — Telehealth: Payer: Self-pay | Admitting: Cardiovascular Disease

## 2013-12-07 NOTE — Telephone Encounter (Signed)
Spoke with patient's daughter Chyrl Civatte. Patient had loop recorder placed by Dr. Salena Saner 1 week ago and a [employer] event monitor arrived at patient's home yesterday. Appears to have been ordered sometime prior to loop recorder implant. Called [employer] to explain situation and to cancel order for monitor.

## 2013-12-07 NOTE — Telephone Encounter (Signed)
Please call-confused because he received a cardionet monitor yesterday,He already has a loop recorder.

## 2013-12-08 ENCOUNTER — Ambulatory Visit: Payer: Medicare Other | Admitting: Cardiology

## 2013-12-09 ENCOUNTER — Ambulatory Visit (INDEPENDENT_AMBULATORY_CARE_PROVIDER_SITE_OTHER): Payer: Medicare Other | Admitting: Cardiology

## 2013-12-09 ENCOUNTER — Encounter: Payer: Self-pay | Admitting: Cardiology

## 2013-12-09 VITALS — BP 138/80 | HR 64 | Ht 70.0 in | Wt 177.0 lb

## 2013-12-09 DIAGNOSIS — Z95818 Presence of other cardiac implants and grafts: Secondary | ICD-10-CM | POA: Insufficient documentation

## 2013-12-09 NOTE — Patient Instructions (Signed)
Your physician recommends that you schedule a follow-up appointment in: one month with Dr Royann Shivers

## 2013-12-09 NOTE — Progress Notes (Signed)
12/09/2013 Celso Sickle Fadden   05-05-1929  [id number]  Primary Physicia Ailene Ravel, MD Primary Cardiologist: Dr Tresa Endo  HPI:  79 y/o seen today S/P loop recorder implant. See Dr Landry Dyke complete note from 11/27.   Current Outpatient Prescriptions  Medication Sig Dispense Refill  . apixaban (ELIQUIS) 5 MG TABS tablet Take 1 tablet (5 mg total) by mouth every 12 (twelve) hours.  60 tablet  1  . atorvastatin (LIPITOR) 40 MG tablet TAKE 1 TABLET BY MOUTH EVERY DAY  30 tablet  11  . diphenhydramine-acetaminophen (TYLENOL PM) 25-500 MG TABS Take 2 tablets by mouth at bedtime as needed.       . loratadine (CLARITIN) 10 MG tablet Take 10 mg by mouth daily.      . metoprolol tartrate (LOPRESSOR) 25 MG tablet Take 1 tablet (25 mg total) by mouth 2 (two) times daily.  60 tablet  6  . midodrine (PROAMATINE) 5 MG tablet Take 1 tablet (5 mg total) by mouth 3 (three) times daily. One by mouth three times daily  270 tablet  3  . Multiple Vitamins-Minerals ([employer]S SPECTRAVITE SENIOR) TABS Take 1 tablet by mouth daily.        . niacin (NIASPAN) 500 MG CR tablet Take 1 tablet (500 mg total) by mouth at bedtime.  30 tablet  11  . Probiotic Product ([employer]S SENIOR PROBIOTIC PO) Once daily       . sertraline (ZOLOFT) 25 MG tablet Take 1 tablet (25 mg total) by mouth daily. Once daily  30 tablet  6  . SPIRIVA HANDIHALER 18 MCG inhalation capsule PLACE 1 CAPSULE (18 MCG TOTAL) INTO INHALER AND INHALE DAILY.  90 capsule  3  . SYMBICORT 80-4.5 MCG/ACT inhaler INHALE 2 PUFFS INTO THE LUNGS 2 (TWO) TIMES DAILY.  1 Inhaler  5  . Tamsulosin HCl (FLOMAX) 0.4 MG CAPS Take 0.4 mg by mouth daily.         No current facility-administered medications for this visit.    No Known Allergies  History   Social History  . Marital Status: Married    Spouse Name: N/A    Number of Children: N/A  . Years of Education: N/A   Occupational History  . 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 Smoking status: Former Smoker -- 1.00 packs/day for 47 years    Types: Cigarettes    Quit date: 12/30/1993  . Smokeless tobacco: Never Used  . Alcohol Use: No  . Drug Use: No  . Sexual Activity: Not on file   Other Topics Concern  . Not on file   Social History Narrative  . No narrative on file     Review of Systems: General: negative for chills, fever, night sweats or weight changes.  Cardiovascular: negative for chest pain, dyspnea on exertion, edema, orthopnea, palpitations, paroxysmal nocturnal dyspnea or shortness of breath Dermatological: negative for rash Respiratory: negative for cough or wheezing Urologic: negative for hematuria Abdominal: negative for nausea, vomiting, diarrhea, bright red blood per rectum, melena, or hematemesis Neurologic: negative for visual changes, syncope, or dizziness All other systems reviewed and are otherwise negative except as noted above.    Blood pressure 138/80, pulse 64, height 5\' 10"  (1.778 m), weight 177 lb (80.287 kg).  Loop recorder site without hematoma    ASSESSMENT AND PLAN:   Status post placement of implantable loop recorder .   PLAN  Steri strips removed, wound looks good.  Pt will follow up with Dr Royann Shivers in one month.  Levonia Wolfley KPA-C 12/09/2013 4:21 PM

## 2014-01-06 ENCOUNTER — Encounter: Payer: Self-pay | Admitting: Cardiovascular Disease

## 2014-01-06 ENCOUNTER — Ambulatory Visit (INDEPENDENT_AMBULATORY_CARE_PROVIDER_SITE_OTHER): Payer: Medicare Other | Admitting: Cardiovascular Disease

## 2014-01-06 VITALS — BP 132/70 | HR 76 | Resp 18 | Ht 70.0 in | Wt 180.2 lb

## 2014-01-06 DIAGNOSIS — Z79899 Other long term (current) drug therapy: Secondary | ICD-10-CM

## 2014-01-06 DIAGNOSIS — R5381 Other malaise: Secondary | ICD-10-CM

## 2014-01-06 DIAGNOSIS — I469 Cardiac arrest, cause unspecified: Secondary | ICD-10-CM

## 2014-01-06 DIAGNOSIS — R5383 Other fatigue: Secondary | ICD-10-CM

## 2014-01-06 DIAGNOSIS — Z951 Presence of aortocoronary bypass graft: Secondary | ICD-10-CM | POA: Diagnosis not present

## 2014-01-06 DIAGNOSIS — I441 Atrioventricular block, second degree: Secondary | ICD-10-CM | POA: Diagnosis not present

## 2014-01-06 DIAGNOSIS — I4892 Unspecified atrial flutter: Secondary | ICD-10-CM

## 2014-01-06 DIAGNOSIS — D689 Coagulation defect, unspecified: Secondary | ICD-10-CM

## 2014-01-06 DIAGNOSIS — R55 Syncope and collapse: Secondary | ICD-10-CM

## 2014-01-06 MED ORDER — APIXABAN 5 MG PO TABS: 5.0000 mg | ORAL_TABLET | Freq: Two times a day (BID) | ORAL | Status: DC

## 2014-01-06 NOTE — Patient Instructions (Signed)
Your physician has recommended that you have a [place]  pacemaker inserted. A pacemaker is a small device that is placed under the skin of your chest or abdomen to help control abnormal heart rhythms. This device uses electrical pulses to prompt the heart to beat at a normal rate. Pacemakers are used to treat heart rhythms that are too slow. Wire (leads) are attached to the pacemaker that goes into the chambers of you heart. This is done in the hospital and usually requires and overnight stay. Please see the instruction sheet given to you today for more information.  Have blood work done 5-7 days prior to the pacemaker implant.

## 2014-01-06 NOTE — Progress Notes (Signed)
Patient ID: [name], male   DOB: [birth year], 80 y.o.   MRN: [id number]      Reason for office visit Syncope  Roughly one month following loop recorder implantation, [name] returns for device interrogation. His loop recorder has demonstrated high-grade atrioventricular block with pauses of at least 3 seconds in duration. 2 episodes have been noted both of them occurring when he was either sitting down or lying down. They're both asymptomatic. One of the episodes looks more consistent with atrial standstill, the other demonstrates relatively convincing high-grade AV block for about 7 atrial beats. He is not taking any negative chronotropic agents.   No Known Allergies  Current Outpatient Prescriptions  Medication Sig Dispense Refill  . apixaban (ELIQUIS) 5 MG TABS tablet Take 1 tablet (5 mg total) by mouth every 12 (twelve) hours.  180 tablet  3  . atorvastatin (LIPITOR) 40 MG tablet TAKE 1 TABLET BY MOUTH EVERY DAY  30 tablet  11  . diphenhydramine-acetaminophen (TYLENOL PM) 25-500 MG TABS Take 2 tablets by mouth at bedtime as needed.       . loratadine (CLARITIN) 10 MG tablet Take 10 mg by mouth daily.      . metoprolol tartrate (LOPRESSOR) 25 MG tablet Take 1 tablet (25 mg total) by mouth 2 (two) times daily.  60 tablet  6  . midodrine (PROAMATINE) 5 MG tablet Take 1 tablet (5 mg total) by mouth 3 (three) times daily. One by mouth three times daily  270 tablet  3  . Multiple Vitamins-Minerals ([employer] SPECTRAVITE SENIOR) TABS Take 1 tablet by mouth daily.        . niacin (NIASPAN) 500 MG CR tablet Take 1 tablet (500 mg total) by mouth at bedtime.  30 tablet  11  . Probiotic Product ([employer] SENIOR PROBIOTIC PO) Once daily       . sertraline (ZOLOFT) 25 MG tablet Take 1 tablet (25 mg total) by mouth daily. Once daily  30 tablet  6  . SPIRIVA HANDIHALER 18 MCG inhalation capsule PLACE 1 CAPSULE (18 MCG TOTAL) INTO INHALER AND INHALE DAILY.  90 capsule  3  . SYMBICORT 80-4.5 MCG/ACT inhaler  INHALE 2 PUFFS INTO THE LUNGS 2 (TWO) TIMES DAILY.  1 Inhaler  5  . Tamsulosin HCl (FLOMAX) 0.4 MG CAPS Take 0.4 mg by mouth daily.         No current facility-administered medications for this visit.    Past Medical History  Diagnosis Date  . Coronary heart disease     2D Echo, 04/13/2013 - EF 55-60%, moderate regurg of the tricuspid valve  . COPD (chronic obstructive pulmonary disease)   . MGUS (monoclonal gammopathy of unknown significance) 12/05/2011  . Anemia of renal disease 12/05/2011  . Anemia, iron deficiency 12/05/2011  . CAS (cerebral atherosclerosis)     Carotid Dopplers, 04/09/2012 - Bilateral Bulb/Proximal ICAs-mild to moderate amount of fibrous plaque of fibrous soft plaque w/o evidence of a significant diameter reduction, dissection, or any other vascular abnormality  . Nonspecific ST-T wave electrocardiographic changes     Lexiscan, 06/02/2012 - EKG negative for ischemia, compared to previous study-there is no significant change, normal myocardial perfusion study  . Asthma   . Dysrhythmia 11/09/2013    SVT VS A FLUTTER   . Shortness of breath   . Kidney stones     Past Surgical History  Procedure Laterality Date  . Cardiac catheterization  12/05/2005    Recommended CABG  . Coronary artery bypass graft  12/06/2005    x4, LIMA to LAD, vein to distal circumflex, sequential vein to intermediate and obtuse marginal vessel  . Kidney stone surgery    . Inner ear surgery    . Cataract extraction    . Tee without cardioversion N/A 11/09/2013    Procedure: TRANSESOPHAGEAL ECHOCARDIOGRAM (TEE);  Surgeon: Pixie Casino, MD;  Location: [facility] ENDOSCOPY;  Service: Cardiovascular;  Laterality: N/A;  . Cardioversion N/A 11/09/2013    Procedure: CARDIOVERSION;  Surgeon: Pixie Casino, MD;  Location: [facility] ENDOSCOPY;  Service: Cardiovascular;  Laterality: N/A;    Family History  Problem Relation Age of Onset  . Tuberculosis Father   . Heart attack Father   . Heart disease Mother   .  Heart attack Mother   . Heart disease Brother   . Emphysema Daughter     premature without tobacco exposure    History   Social History  . Marital Status: Married    Spouse Name: N/A    Number of Children: N/A  . Years of Education: N/A   Occupational History  . retired long distance Administrator   . maintenence    Social History Main Topics  . Smoking status: Former Smoker -- 1.00 packs/day for 47 years    Types: Cigarettes    Quit date: 12/30/1993  . Smokeless tobacco: Never Used  . Alcohol Use: No  . Drug Use: No  . Sexual Activity: Not on file   Other Topics Concern  . Not on file   Social History Narrative  . No narrative on file    Review of systems: Review of systems:  The patient specifically denies any chest pain at rest or with exertion, dyspnea at rest or with exertion, orthopnea, paroxysmal nocturnal dyspnea, palpitations, focal neurological deficits, intermittent claudication, lower extremity edema, unexplained weight gain, cough, hemoptysis or wheezing.  The patient also denies abdominal pain, nausea, vomiting, dysphagia, diarrhea, constipation, polyuria, polydipsia, dysuria, hematuria, frequency, urgency, abnormal bleeding or bruising, fever, chills, unexpected weight changes, mood swings, change in skin or hair texture, change in voice quality, auditory or visual problems, allergic reactions or rashes, new musculoskeletal complaints other than usual "aches and pains".     PHYSICAL EXAM BP 132/70  Pulse 76  Resp 18  Ht _0  (1.778 m)  Wt 180 lb 3.2 oz (81.738 kg)  BMI 25.86 kg/m2 General: Alert, oriented x3, no distress  Head: no evidence of trauma, PERRL, EOMI, no exophtalmos or lid lag, no myxedema, no xanthelasma; normal ears, nose and oropharynx  Neck: normal jugular venous pulsations and no hepatojugular reflux; brisk carotid pulses without delay and no carotid bruits  Chest: clear to auscultation, no signs of consolidation by percussion or  palpation, normal fremitus, symmetrical and full respiratory excursions  Cardiovascular: normal position and quality of the apical impulse, regular rhythm, normal first and second heart sounds, no rubs or gallops, 1/6 systolic murmur, likely aortic ejection  Abdomen: no tenderness or distention, no masses by palpation, no abnormal pulsatility or arterial bruits, normal bowel sounds, no hepatosplenomegaly  Extremities: no clubbing, cyanosis or edema; 2+ radial, ulnar and brachial pulses bilaterally; 2+ right femoral, posterior tibial and dorsalis pedis pulses; 2+ left femoral, posterior tibial and dorsalis pedis pulses; no subclavian or femoral bruits  Neurological: grossly nonfocal   BMET    Component Value Date/Time   NA 139 11/10/2013 0700   K 4.5 11/10/2013 0700   CL 105 11/10/2013 0700   CO2 24 11/10/2013 0700   GLUCOSE  103* 11/10/2013 0700   BUN 19 11/10/2013 0700   CREATININE 1.35 11/10/2013 0700   CREATININE 1.37* 08/04/2013 1611   CALCIUM 8.7 11/10/2013 0700   GFRNONAA 47* 11/10/2013 0700   GFRAA 55* 11/10/2013 0700     ASSESSMENT AND PLAN Syncope His loop recorder has proven to be diagnostic sooner than expected. He has evidence of paroxysmal complete heart block or at least very high-grade second-degree AV block. Implantation of a dual chamber permanent pacemaker is appropriate. He is reminded that he should not drive. The technical details of pacemaker implantation, the risks and benefits of these devices and long-term followup as well as potential immediate complications were discussed in detail with the patient and his family. He agrees to proceed. We will plan to remove the loop recorder at the time of pacemaker implantation.  Atrial flutter No atrial tachyarrhythmias were recorded in the brief period since implanted this loop recorder. His and device will serve as a good monitor for future SVT  S/P CABG x 4 8 years status post bypass surgery he does not have symptoms or  signs of active coronary insufficiency . He has preserved left ventricular systolic function.  after pacemaker implantation it may be appropriate to restart beta blocker therapy for neurally mediated syncope and SVT/atrial flutter Orders Placed This Encounter  Procedures  . Comp Met (CMET)  . CBC  . Protime-INR  . APTT  . PERMANENT PACEMAKER INSERTION   Meds ordered this encounter  Medications  . DISCONTD: apixaban (ELIQUIS) 5 MG TABS tablet    Sig: Take 1 tablet (5 mg total) by mouth every 12 (twelve) hours.    Dispense:  60 tablet    Refill:  1  . apixaban (ELIQUIS) 5 MG TABS tablet    Sig: Take 1 tablet (5 mg total) by mouth every 12 (twelve) hours.    Dispense:  180 tablet    Refill:  Knik River Fremon Zacharia, MD, [facility] [phone] office [phone] pager

## 2014-01-06 NOTE — Assessment & Plan Note (Signed)
His loop recorder has proven to be diagnostic sooner than expected. He has evidence of paroxysmal complete heart block or at least very high-grade second-degree AV block. Implantation of a dual chamber permanent pacemaker is appropriate. He is reminded that he should not drive. The technical details of pacemaker implantation, the risks and benefits of these devices and long-term followup as well as potential immediate complications were discussed in detail with the patient and his family. He agrees to proceed. We will plan to remove the loop recorder at the time of pacemaker implantation.

## 2014-01-06 NOTE — Assessment & Plan Note (Signed)
No atrial tachyarrhythmias were recorded in the brief period since implanted this loop recorder. His and device will serve as a good monitor for future SVT

## 2014-01-06 NOTE — Assessment & Plan Note (Signed)
8 years status post bypass surgery he does not have symptoms or signs of active coronary insufficiency . He has preserved left ventricular systolic function.

## 2014-01-11 LAB — MDC_IDC_ENUM_SESS_TYPE_INCLINIC

## 2014-01-20 ENCOUNTER — Other Ambulatory Visit: Payer: Self-pay | Admitting: *Deleted

## 2014-01-20 DIAGNOSIS — I469 Cardiac arrest, cause unspecified: Secondary | ICD-10-CM

## 2014-01-20 DIAGNOSIS — I441 Atrioventricular block, second degree: Secondary | ICD-10-CM

## 2014-01-21 ENCOUNTER — Telehealth: Payer: Self-pay | Admitting: Hematology & Oncology

## 2014-01-21 NOTE — Telephone Encounter (Signed)
Left message moved 4-10 to 4-3 °

## 2014-01-25 ENCOUNTER — Encounter (HOSPITAL_COMMUNITY): Payer: Self-pay | Admitting: Pharmacy Technician

## 2014-01-27 DIAGNOSIS — D689 Coagulation defect, unspecified: Secondary | ICD-10-CM | POA: Diagnosis not present

## 2014-01-27 DIAGNOSIS — R5383 Other fatigue: Secondary | ICD-10-CM | POA: Diagnosis not present

## 2014-01-27 DIAGNOSIS — R5381 Other malaise: Secondary | ICD-10-CM | POA: Diagnosis not present

## 2014-01-27 DIAGNOSIS — Z79899 Other long term (current) drug therapy: Secondary | ICD-10-CM | POA: Diagnosis not present

## 2014-01-27 LAB — COMPREHENSIVE METABOLIC PANEL
ALBUMIN: 4 g/dL (ref 3.5–5.2)
ALK PHOS: 94 U/L (ref 39–117)
ALT: 33 U/L (ref 0–53)
AST: 29 U/L (ref 0–37)
BUN: 24 mg/dL — ABNORMAL HIGH (ref 6–23)
CALCIUM: 9.1 mg/dL (ref 8.4–10.5)
CHLORIDE: 108 meq/L (ref 96–112)
CO2: 26 mEq/L (ref 19–32)
Creat: 1.29 mg/dL (ref 0.50–1.35)
Glucose, Bld: 96 mg/dL (ref 70–99)
POTASSIUM: 4.6 meq/L (ref 3.5–5.3)
Sodium: 140 mEq/L (ref 135–145)
Total Bilirubin: 1 mg/dL (ref 0.2–1.2)
Total Protein: 7.1 g/dL (ref 6.0–8.3)

## 2014-01-27 LAB — CBC
HCT: 37.3 % — ABNORMAL LOW (ref 39.0–52.0)
Hemoglobin: 12.3 g/dL — ABNORMAL LOW (ref 13.0–17.0)
MCH: 31.5 pg (ref 26.0–34.0)
MCHC: 33 g/dL (ref 30.0–36.0)
MCV: 95.4 fL (ref 78.0–100.0)
Platelets: 178 10*3/uL (ref 150–400)
RBC: 3.91 MIL/uL — ABNORMAL LOW (ref 4.22–5.81)
RDW: 14.8 % (ref 11.5–15.5)
WBC: 4.3 10*3/uL (ref 4.0–10.5)

## 2014-01-27 LAB — APTT: aPTT: 38 seconds — ABNORMAL HIGH (ref 24–37)

## 2014-01-27 LAB — PROTIME-INR
INR: 1.26 (ref <1.50)
PROTHROMBIN TIME: 15.6 s — AB (ref 11.6–15.2)

## 2014-02-01 DIAGNOSIS — N200 Calculus of kidney: Secondary | ICD-10-CM | POA: Diagnosis not present

## 2014-02-01 DIAGNOSIS — N401 Enlarged prostate with lower urinary tract symptoms: Secondary | ICD-10-CM | POA: Diagnosis not present

## 2014-02-01 DIAGNOSIS — N139 Obstructive and reflux uropathy, unspecified: Secondary | ICD-10-CM | POA: Diagnosis not present

## 2014-02-03 DIAGNOSIS — I2510 Atherosclerotic heart disease of native coronary artery without angina pectoris: Secondary | ICD-10-CM | POA: Diagnosis not present

## 2014-02-03 DIAGNOSIS — Z87891 Personal history of nicotine dependence: Secondary | ICD-10-CM | POA: Diagnosis not present

## 2014-02-03 DIAGNOSIS — I441 Atrioventricular block, second degree: Secondary | ICD-10-CM | POA: Diagnosis not present

## 2014-02-03 DIAGNOSIS — Z951 Presence of aortocoronary bypass graft: Secondary | ICD-10-CM | POA: Diagnosis not present

## 2014-02-03 DIAGNOSIS — J449 Chronic obstructive pulmonary disease, unspecified: Secondary | ICD-10-CM | POA: Diagnosis not present

## 2014-02-03 DIAGNOSIS — D472 Monoclonal gammopathy: Secondary | ICD-10-CM | POA: Diagnosis not present

## 2014-02-03 DIAGNOSIS — I442 Atrioventricular block, complete: Secondary | ICD-10-CM | POA: Diagnosis not present

## 2014-02-03 DIAGNOSIS — Z4509 Encounter for adjustment and management of other cardiac device: Secondary | ICD-10-CM | POA: Diagnosis not present

## 2014-02-03 DIAGNOSIS — D509 Iron deficiency anemia, unspecified: Secondary | ICD-10-CM | POA: Diagnosis not present

## 2014-02-03 DIAGNOSIS — Z7901 Long term (current) use of anticoagulants: Secondary | ICD-10-CM | POA: Diagnosis not present

## 2014-02-03 DIAGNOSIS — D631 Anemia in chronic kidney disease: Secondary | ICD-10-CM | POA: Diagnosis not present

## 2014-02-03 DIAGNOSIS — I455 Other specified heart block: Secondary | ICD-10-CM | POA: Diagnosis not present

## 2014-02-03 DIAGNOSIS — I672 Cerebral atherosclerosis: Secondary | ICD-10-CM | POA: Diagnosis not present

## 2014-02-03 DIAGNOSIS — I495 Sick sinus syndrome: Secondary | ICD-10-CM | POA: Diagnosis not present

## 2014-02-03 MED ORDER — SODIUM CHLORIDE 0.9 % IR SOLN
80.0000 mg | Status: DC
  Filled 2014-02-03: qty 2

## 2014-02-03 MED ORDER — CEFAZOLIN SODIUM-DEXTROSE 2-3 GM-% IV SOLR
2.0000 g | INTRAVENOUS | Status: DC
  Filled 2014-02-03 (×2): qty 50

## 2014-02-04 ENCOUNTER — Ambulatory Visit (HOSPITAL_COMMUNITY)
Admission: RE | Admit: 2014-02-04 | Discharge: 2014-02-05 | Disposition: A | Discharge status: Home / Self Care | Payer: Medicare Other | Source: Ambulatory Visit | Attending: Cardiovascular Disease | Admitting: Cardiovascular Disease

## 2014-02-04 ENCOUNTER — Encounter (HOSPITAL_COMMUNITY)
Admission: RE | Disposition: A | Discharge status: Home / Self Care | Payer: Self-pay | Source: Ambulatory Visit | Attending: Cardiovascular Disease

## 2014-02-04 DIAGNOSIS — I495 Sick sinus syndrome: Secondary | ICD-10-CM | POA: Diagnosis not present

## 2014-02-04 DIAGNOSIS — I441 Atrioventricular block, second degree: Secondary | ICD-10-CM | POA: Diagnosis not present

## 2014-02-04 DIAGNOSIS — I4430 Unspecified atrioventricular block: Secondary | ICD-10-CM | POA: Diagnosis present

## 2014-02-04 DIAGNOSIS — I672 Cerebral atherosclerosis: Secondary | ICD-10-CM | POA: Insufficient documentation

## 2014-02-04 DIAGNOSIS — I455 Other specified heart block: Secondary | ICD-10-CM | POA: Diagnosis not present

## 2014-02-04 DIAGNOSIS — I442 Atrioventricular block, complete: Secondary | ICD-10-CM | POA: Insufficient documentation

## 2014-02-04 DIAGNOSIS — Z4509 Encounter for adjustment and management of other cardiac device: Secondary | ICD-10-CM | POA: Insufficient documentation

## 2014-02-04 DIAGNOSIS — D509 Iron deficiency anemia, unspecified: Secondary | ICD-10-CM | POA: Insufficient documentation

## 2014-02-04 DIAGNOSIS — Z87891 Personal history of nicotine dependence: Secondary | ICD-10-CM | POA: Insufficient documentation

## 2014-02-04 DIAGNOSIS — J4489 Other specified chronic obstructive pulmonary disease: Secondary | ICD-10-CM | POA: Insufficient documentation

## 2014-02-04 DIAGNOSIS — R55 Syncope and collapse: Secondary | ICD-10-CM | POA: Diagnosis present

## 2014-02-04 DIAGNOSIS — I2510 Atherosclerotic heart disease of native coronary artery without angina pectoris: Secondary | ICD-10-CM | POA: Insufficient documentation

## 2014-02-04 DIAGNOSIS — N039 Chronic nephritic syndrome with unspecified morphologic changes: Secondary | ICD-10-CM

## 2014-02-04 DIAGNOSIS — I469 Cardiac arrest, cause unspecified: Secondary | ICD-10-CM

## 2014-02-04 DIAGNOSIS — Z951 Presence of aortocoronary bypass graft: Secondary | ICD-10-CM

## 2014-02-04 DIAGNOSIS — J449 Chronic obstructive pulmonary disease, unspecified: Secondary | ICD-10-CM | POA: Diagnosis present

## 2014-02-04 DIAGNOSIS — D472 Monoclonal gammopathy: Secondary | ICD-10-CM | POA: Insufficient documentation

## 2014-02-04 DIAGNOSIS — Z7901 Long term (current) use of anticoagulants: Secondary | ICD-10-CM | POA: Insufficient documentation

## 2014-02-04 DIAGNOSIS — D631 Anemia in chronic kidney disease: Secondary | ICD-10-CM | POA: Insufficient documentation

## 2014-02-04 DIAGNOSIS — Z950 Presence of cardiac pacemaker: Secondary | ICD-10-CM | POA: Diagnosis present

## 2014-02-04 HISTORY — PX: PERMANENT PACEMAKER INSERTION: SHX5480

## 2014-02-04 LAB — SURGICAL PCR SCREEN
MRSA, PCR: NEGATIVE
STAPHYLOCOCCUS AUREUS: NEGATIVE

## 2014-02-04 SURGERY — PERMANENT PACEMAKER INSERTION
Anesthesia: LOCAL

## 2014-02-04 MED ORDER — NIACIN ER (ANTIHYPERLIPIDEMIC) 500 MG PO TBCR
500.0000 mg | EXTENDED_RELEASE_TABLET | Freq: Every day | ORAL | Status: DC
  Administered 2014-02-04: 500 mg via ORAL
  Filled 2014-02-04 (×2): qty 1

## 2014-02-04 MED ORDER — MUPIROCIN 2 % EX OINT
TOPICAL_OINTMENT | Freq: Two times a day (BID) | CUTANEOUS | Status: DC
  Administered 2014-02-04: 1 via NASAL
  Filled 2014-02-04: qty 22

## 2014-02-04 MED ORDER — TAMSULOSIN HCL 0.4 MG PO CAPS
0.4000 mg | ORAL_CAPSULE | Freq: Every day | ORAL | Status: DC
  Administered 2014-02-04: 0.4 mg via ORAL
  Filled 2014-02-04 (×2): qty 1

## 2014-02-04 MED ORDER — SERTRALINE HCL 25 MG PO TABS
25.0000 mg | ORAL_TABLET | Freq: Every morning | ORAL | Status: DC
  Administered 2014-02-05: 25 mg via ORAL
  Filled 2014-02-04: qty 1

## 2014-02-04 MED ORDER — BUDESONIDE-FORMOTEROL FUMARATE 80-4.5 MCG/ACT IN AERO
2.0000 | INHALATION_SPRAY | Freq: Two times a day (BID) | RESPIRATORY_TRACT | Status: DC
  Filled 2014-02-04: qty 6.9

## 2014-02-04 MED ORDER — MIDAZOLAM HCL 5 MG/5ML IJ SOLN
INTRAMUSCULAR | Status: AC
  Filled 2014-02-04: qty 5

## 2014-02-04 MED ORDER — ATORVASTATIN CALCIUM 40 MG PO TABS
40.0000 mg | ORAL_TABLET | Freq: Every evening | ORAL | Status: DC
  Administered 2014-02-04: 40 mg via ORAL
  Filled 2014-02-04 (×3): qty 1

## 2014-02-04 MED ORDER — CHLORHEXIDINE GLUCONATE 4 % EX LIQD: 60.0000 mL | Freq: Once | CUTANEOUS | Status: DC

## 2014-02-04 MED ORDER — ONDANSETRON HCL 4 MG/2ML IJ SOLN: 4.0000 mg | Freq: Four times a day (QID) | INTRAMUSCULAR | Status: DC | PRN

## 2014-02-04 MED ORDER — LIDOCAINE HCL (PF) 1 % IJ SOLN
INTRAMUSCULAR | Status: AC
  Filled 2014-02-04: qty 60

## 2014-02-04 MED ORDER — SODIUM CHLORIDE 0.9 % IV SOLN
INTRAVENOUS | Status: AC
  Administered 2014-02-04: 19:00:00 via INTRAVENOUS

## 2014-02-04 MED ORDER — CEFAZOLIN SODIUM 1-5 GM-% IV SOLN
1.0000 g | Freq: Four times a day (QID) | INTRAVENOUS | Status: AC
  Administered 2014-02-04 – 2014-02-05 (×3): 1 g via INTRAVENOUS
  Filled 2014-02-04 (×3): qty 50

## 2014-02-04 MED ORDER — ACETAMINOPHEN 325 MG PO TABS: 325.0000 mg | ORAL_TABLET | ORAL | Status: DC | PRN

## 2014-02-04 MED ORDER — SODIUM CHLORIDE 0.9 % IJ SOLN: 3.0000 mL | INTRAMUSCULAR | Status: DC | PRN

## 2014-02-04 MED ORDER — DIAZEPAM 5 MG PO TABS
5.0000 mg | ORAL_TABLET | ORAL | Status: DC
  Filled 2014-02-04: qty 1

## 2014-02-04 MED ORDER — HEPARIN (PORCINE) IN NACL 2-0.9 UNIT/ML-% IJ SOLN
INTRAMUSCULAR | Status: AC
  Filled 2014-02-04: qty 1000

## 2014-02-04 MED ORDER — TIOTROPIUM BROMIDE MONOHYDRATE 18 MCG IN CAPS
18.0000 ug | ORAL_CAPSULE | Freq: Every morning | RESPIRATORY_TRACT | Status: DC
  Filled 2014-02-04: qty 5

## 2014-02-04 MED ORDER — METOPROLOL TARTRATE 25 MG PO TABS
25.0000 mg | ORAL_TABLET | Freq: Two times a day (BID) | ORAL | Status: DC
  Administered 2014-02-04 – 2014-02-05 (×2): 25 mg via ORAL
  Filled 2014-02-04 (×3): qty 1

## 2014-02-04 MED ORDER — SODIUM CHLORIDE 0.9 % IV SOLN
INTRAVENOUS | Status: DC
  Administered 2014-02-04: 13:00:00 via INTRAVENOUS

## 2014-02-04 MED ORDER — LORATADINE 10 MG PO TABS
10.0000 mg | ORAL_TABLET | Freq: Every day | ORAL | Status: DC | PRN
  Filled 2014-02-04: qty 1

## 2014-02-04 NOTE — Interval H&P Note (Signed)
History and Physical Interval Note:  02/04/2014 12:31 PM  [name]  has presented today for surgery, with the diagnosis of Heart block  The various methods of treatment have been discussed with the patient and family. After consideration of risks, benefits and other options for treatment, the patient has consented to  Procedure(s): PERMANENT PACEMAKER INSERTION (N/A) as a surgical intervention .  The patient's history has been reviewed, patient examined, no change in status, stable for surgery.  I have reviewed the patient's chart and labs.  Questions were answered to the patient's satisfaction.     [name]

## 2014-02-04 NOTE — CV Procedure (Signed)
Procedure report  Procedure performed:  1. Implantation of new dual chamber permanent pacemaker 2. Fluoroscopy 3. Light sedation 4. Loop recorder explantation  Reason for procedure: Symptomatic bradycardia (syncope) due to: Sinus node dysfunction Sinus arrest Tachycardia-bradycardia syndrome  Procedure performed by: Sanda Klein, MD  Complications: None  Estimated blood loss: <10 mL  Medications administered during procedure: Ancef 2 g intravenously Lidocaine 1% 30 mL locally,  Versed 2 mg intravenously  Device details: Generator Medtronic Adapta model ADDRL1 serial number [id number] Right atrial lead Medtronic [id number] serial number [id number] Right ventricular lead Medtronic [id number] serial number [id number]  Procedure details:  After the risks and benefits of the procedure were discussed the patient provided informed consent and was brought to the cardiac cath lab in the fasting state. The patient was prepped and draped in usual sterile fashion. Local anesthesia with 1% lidocaine was administered to to the left infraclavicular area. A 5-6 cm horizontal incision was made parallel with and 2-3 cm caudal to the left clavicle. Using electrocautery and blunt dissection a prepectoral pocket was created down to the level of the pectoralis major muscle fascia. The pocket was carefully inspected for hemostasis. An antibiotic-soaked sponge was placed in the pocket.  Under fluoroscopic guidance and using the modified Seldinger technique 2 separate venipunctures were performed to access the left subclavian vein. Some difficulty was encountered accessing the vein.  Two J-tip guidewires were subsequently exchanged for two 7 French safe sheaths.  Under fluoroscopic guidance the ventricular lead was advanced to level of the mid to apical right ventricular septum and thet active-fixation helix was deployed. Prominent current of injury was seen. Satisfactory pacing and sensing parameters were  recorded. There was no evidence of diaphragmatic stimulation at maximum device output. The safe sheath was peeled away and the lead was secured in place with 2-0 silk.  In similar fashion the right atrial lead was advanced to the level of the atrial appendage. The active-fixation helix was deployed. There was prominent current of injury. Satisfactory  pacing and sensing parameters were recorded. There was no evidence of diaphragmatic stimulation with pacing at maximum device output. The safe sheath was peeled away and the lead was secured in place with 2-0 silk.  The antibiotic-soaked sponge was removed from the pocket. The pocket was flushed with copious amounts of antibiotic solution. Reinspection showed excellent hemostasis..  The ventricular lead was connected to the generator and appropriate ventricular pacing was seen. Subsequently the atrial lead was also connected. Repeat testing of the lead parameters later showed excellent values.  The entire system was then carefully inserted in the pocket with care been taking that the leads and device assumed a comfortable position without pressure on the incision. Great care was taken that the leads be located deep to the generator. The pocket was then closed in layers using 2 layers of 2-0 Vicryl and cutaneous staples, after which a sterile dressing was applied.  The loop recorder was subsequently extracted without difficulty and the site was closed with staples and covered with a dressing.  At the end of the procedure the following lead parameters were encountered:  Right atrial lead  sensed P waves 6.6, impedance 653 ohms, threshold 1.3 V at 0.5 ms pulse width.  Right ventricular lead sensed R waves 8.2 mV, impedance 1060 ohms, threshold 0.6 V at 0.5 ms pulse width.  Sanda Klein, MD, [facility] [phone] office [phone] pager

## 2014-02-04 NOTE — H&P (View-Only) (Signed)
Patient ID: [name], male   DOB: [birth year], 80 y.o.   MRN: [id number]      Reason for office visit Syncope  Roughly one month following loop recorder implantation, [name] returns for device interrogation. His loop recorder has demonstrated high-grade atrioventricular block with pauses of at least 3 seconds in duration. 2 episodes have been noted both of them occurring when he was either sitting down or lying down. They're both asymptomatic. One of the episodes looks more consistent with atrial standstill, the other demonstrates relatively convincing high-grade AV block for about 7 atrial beats. He is not taking any negative chronotropic agents.   No Known Allergies  Current Outpatient Prescriptions  Medication Sig Dispense Refill  . apixaban (ELIQUIS) 5 MG TABS tablet Take 1 tablet (5 mg total) by mouth every 12 (twelve) hours.  180 tablet  3  . atorvastatin (LIPITOR) 40 MG tablet TAKE 1 TABLET BY MOUTH EVERY DAY  30 tablet  11  . diphenhydramine-acetaminophen (TYLENOL PM) 25-500 MG TABS Take 2 tablets by mouth at bedtime as needed.       . loratadine (CLARITIN) 10 MG tablet Take 10 mg by mouth daily.      . metoprolol tartrate (LOPRESSOR) 25 MG tablet Take 1 tablet (25 mg total) by mouth 2 (two) times daily.  60 tablet  6  . midodrine (PROAMATINE) 5 MG tablet Take 1 tablet (5 mg total) by mouth 3 (three) times daily. One by mouth three times daily  270 tablet  3  . Multiple Vitamins-Minerals ([employer] SPECTRAVITE SENIOR) TABS Take 1 tablet by mouth daily.        . niacin (NIASPAN) 500 MG CR tablet Take 1 tablet (500 mg total) by mouth at bedtime.  30 tablet  11  . Probiotic Product ([employer] SENIOR PROBIOTIC PO) Once daily       . sertraline (ZOLOFT) 25 MG tablet Take 1 tablet (25 mg total) by mouth daily. Once daily  30 tablet  6  . SPIRIVA HANDIHALER 18 MCG inhalation capsule PLACE 1 CAPSULE (18 MCG TOTAL) INTO INHALER AND INHALE DAILY.  90 capsule  3  . SYMBICORT 80-4.5 MCG/ACT inhaler  INHALE 2 PUFFS INTO THE LUNGS 2 (TWO) TIMES DAILY.  1 Inhaler  5  . Tamsulosin HCl (FLOMAX) 0.4 MG CAPS Take 0.4 mg by mouth daily.         No current facility-administered medications for this visit.    Past Medical History  Diagnosis Date  . Coronary heart disease     2D Echo, 04/13/2013 - EF 55-60%, moderate regurg of the tricuspid valve  . COPD (chronic obstructive pulmonary disease)   . MGUS (monoclonal gammopathy of unknown significance) 12/05/2011  . Anemia of renal disease 12/05/2011  . Anemia, iron deficiency 12/05/2011  . CAS (cerebral atherosclerosis)     Carotid Dopplers, 04/09/2012 - Bilateral Bulb/Proximal ICAs-mild to moderate amount of fibrous plaque of fibrous soft plaque w/o evidence of a significant diameter reduction, dissection, or any other vascular abnormality  . Nonspecific ST-T wave electrocardiographic changes     Lexiscan, 06/02/2012 - EKG negative for ischemia, compared to previous study-there is no significant change, normal myocardial perfusion study  . Asthma   . Dysrhythmia 11/09/2013    SVT VS A FLUTTER   . Shortness of breath   . Kidney stones     Past Surgical History  Procedure Laterality Date  . Cardiac catheterization  12/05/2005    Recommended CABG  . Coronary artery bypass graft  12/06/2005    x4, LIMA to LAD, vein to distal circumflex, sequential vein to intermediate and obtuse marginal vessel  . Kidney stone surgery    . Inner ear surgery    . Cataract extraction    . Tee without cardioversion N/A 11/09/2013    Procedure: TRANSESOPHAGEAL ECHOCARDIOGRAM (TEE);  Surgeon: Pixie Casino, MD;  Location: [facility] ENDOSCOPY;  Service: Cardiovascular;  Laterality: N/A;  . Cardioversion N/A 11/09/2013    Procedure: CARDIOVERSION;  Surgeon: Pixie Casino, MD;  Location: [facility] ENDOSCOPY;  Service: Cardiovascular;  Laterality: N/A;    Family History  Problem Relation Age of Onset  . Tuberculosis Father   . Heart attack Father   . Heart disease Mother   .  Heart attack Mother   . Heart disease Brother   . Emphysema Daughter     premature without tobacco exposure    History   Social History  . Marital Status: Married    Spouse Name: N/A    Number of Children: N/A  . Years of Education: N/A   Occupational History  . retired long distance Administrator   . maintenence    Social History Main Topics  . Smoking status: Former Smoker -- 1.00 packs/day for 47 years    Types: Cigarettes    Quit date: 12/30/1993  . Smokeless tobacco: Never Used  . Alcohol Use: No  . Drug Use: No  . Sexual Activity: Not on file   Other Topics Concern  . Not on file   Social History Narrative  . No narrative on file    Review of systems: Review of systems:  The patient specifically denies any chest pain at rest or with exertion, dyspnea at rest or with exertion, orthopnea, paroxysmal nocturnal dyspnea, palpitations, focal neurological deficits, intermittent claudication, lower extremity edema, unexplained weight gain, cough, hemoptysis or wheezing.  The patient also denies abdominal pain, nausea, vomiting, dysphagia, diarrhea, constipation, polyuria, polydipsia, dysuria, hematuria, frequency, urgency, abnormal bleeding or bruising, fever, chills, unexpected weight changes, mood swings, change in skin or hair texture, change in voice quality, auditory or visual problems, allergic reactions or rashes, new musculoskeletal complaints other than usual "aches and pains".     PHYSICAL EXAM BP 132/70  Pulse 76  Resp 18  Ht _0  (1.778 m)  Wt 180 lb 3.2 oz (81.738 kg)  BMI 25.86 kg/m2 General: Alert, oriented x3, no distress  Head: no evidence of trauma, PERRL, EOMI, no exophtalmos or lid lag, no myxedema, no xanthelasma; normal ears, nose and oropharynx  Neck: normal jugular venous pulsations and no hepatojugular reflux; brisk carotid pulses without delay and no carotid bruits  Chest: clear to auscultation, no signs of consolidation by percussion or  palpation, normal fremitus, symmetrical and full respiratory excursions  Cardiovascular: normal position and quality of the apical impulse, regular rhythm, normal first and second heart sounds, no rubs or gallops, 1/6 systolic murmur, likely aortic ejection  Abdomen: no tenderness or distention, no masses by palpation, no abnormal pulsatility or arterial bruits, normal bowel sounds, no hepatosplenomegaly  Extremities: no clubbing, cyanosis or edema; 2+ radial, ulnar and brachial pulses bilaterally; 2+ right femoral, posterior tibial and dorsalis pedis pulses; 2+ left femoral, posterior tibial and dorsalis pedis pulses; no subclavian or femoral bruits  Neurological: grossly nonfocal   BMET    Component Value Date/Time   NA 139 11/10/2013 0700   K 4.5 11/10/2013 0700   CL 105 11/10/2013 0700   CO2 24 11/10/2013 0700   GLUCOSE  103* 11/10/2013 0700   BUN 19 11/10/2013 0700   CREATININE 1.35 11/10/2013 0700   CREATININE 1.37* 08/04/2013 1611   CALCIUM 8.7 11/10/2013 0700   GFRNONAA 47* 11/10/2013 0700   GFRAA 55* 11/10/2013 0700     ASSESSMENT AND PLAN Syncope His loop recorder has proven to be diagnostic sooner than expected. He has evidence of paroxysmal complete heart block or at least very high-grade second-degree AV block. Implantation of a dual chamber permanent pacemaker is appropriate. He is reminded that he should not drive. The technical details of pacemaker implantation, the risks and benefits of these devices and long-term followup as well as potential immediate complications were discussed in detail with the patient and his family. He agrees to proceed. We will plan to remove the loop recorder at the time of pacemaker implantation.  Atrial flutter No atrial tachyarrhythmias were recorded in the brief period since implanted this loop recorder. His and device will serve as a good monitor for future SVT  S/P CABG x 4 8 years status post bypass surgery he does not have symptoms or  signs of active coronary insufficiency . He has preserved left ventricular systolic function.  after pacemaker implantation it may be appropriate to restart beta blocker therapy for neurally mediated syncope and SVT/atrial flutter Orders Placed This Encounter  Procedures  . Comp Met (CMET)  . CBC  . Protime-INR  . APTT  . PERMANENT PACEMAKER INSERTION   Meds ordered this encounter  Medications  . DISCONTD: apixaban (ELIQUIS) 5 MG TABS tablet    Sig: Take 1 tablet (5 mg total) by mouth every 12 (twelve) hours.    Dispense:  60 tablet    Refill:  1  . apixaban (ELIQUIS) 5 MG TABS tablet    Sig: Take 1 tablet (5 mg total) by mouth every 12 (twelve) hours.    Dispense:  180 tablet    Refill:  [place] River Melvine Julin, MD, [facility] [phone] office [phone] pager

## 2014-02-04 NOTE — Progress Notes (Signed)
UR Completed Kia Stavros Graves-Bigelow, RN,BSN [phone]  

## 2014-02-05 ENCOUNTER — Ambulatory Visit (HOSPITAL_COMMUNITY): Payer: Medicare Other

## 2014-02-05 DIAGNOSIS — D472 Monoclonal gammopathy: Secondary | ICD-10-CM | POA: Diagnosis not present

## 2014-02-05 DIAGNOSIS — I455 Other specified heart block: Secondary | ICD-10-CM | POA: Diagnosis not present

## 2014-02-05 DIAGNOSIS — Z95818 Presence of other cardiac implants and grafts: Secondary | ICD-10-CM | POA: Diagnosis not present

## 2014-02-05 DIAGNOSIS — Z4509 Encounter for adjustment and management of other cardiac device: Secondary | ICD-10-CM | POA: Diagnosis not present

## 2014-02-05 DIAGNOSIS — I2510 Atherosclerotic heart disease of native coronary artery without angina pectoris: Secondary | ICD-10-CM | POA: Diagnosis not present

## 2014-02-05 DIAGNOSIS — I495 Sick sinus syndrome: Secondary | ICD-10-CM | POA: Diagnosis not present

## 2014-02-05 DIAGNOSIS — R0989 Other specified symptoms and signs involving the circulatory and respiratory systems: Secondary | ICD-10-CM | POA: Diagnosis not present

## 2014-02-05 DIAGNOSIS — I441 Atrioventricular block, second degree: Secondary | ICD-10-CM | POA: Diagnosis not present

## 2014-02-05 DIAGNOSIS — I4430 Unspecified atrioventricular block: Secondary | ICD-10-CM | POA: Diagnosis present

## 2014-02-05 MED ORDER — YOU HAVE A PACEMAKER BOOK
Freq: Once | Status: AC
  Administered 2014-02-05: 06:00:00
  Filled 2014-02-05: qty 1

## 2014-02-05 MED ORDER — ACETAMINOPHEN 325 MG PO TABS: 325.0000 mg | ORAL_TABLET | ORAL | Status: DC | PRN

## 2014-02-05 MED ORDER — APIXABAN 5 MG PO TABS: 5.0000 mg | ORAL_TABLET | Freq: Two times a day (BID) | ORAL | Status: DC

## 2014-02-05 NOTE — Discharge Instructions (Signed)
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
## 2014-02-05 NOTE — Progress Notes (Signed)
Subjective:  Minimal soreness. Some oozing on surgical dressing. No pocket hematoma.  Objective:  Temp:  [97.7 F (36.5 C)-98.3 F (36.8 C)] 97.7 F (36.5 C) (02/07 0649) Pulse Rate:  [68-86] 86 (02/07 0649) Resp:  [18] 18 (02/07 0649) BP: (120-144)/(62-72) 130/72 mmHg (02/07 0649) SpO2:  [95 %-98 %] 95 % (02/07 0649) Weight:  [86.183 kg (190 lb)] 86.183 kg (190 lb) (02/06 1202) Weight change:   Intake/Output from previous day:    Intake/Output from this shift:    Medications: Current Facility-Administered Medications  Medication Dose Route Frequency Provider Last Rate Last Dose  . acetaminophen (TYLENOL) tablet 325-650 mg  325-650 mg Oral Q4H PRN Maddyx Wieck, MD      . atorvastatin (LIPITOR) tablet 40 mg  40 mg Oral QPM Catia Todorov, MD   40 mg at 02/04/14 2157  . budesonide-formoterol (SYMBICORT) 80-4.5 MCG/ACT inhaler 2 puff  2 puff Inhalation BID Tavius Turgeon, MD      . loratadine (CLARITIN) tablet 10 mg  10 mg Oral Daily PRN Curtisha Bendix, MD      . metoprolol tartrate (LOPRESSOR) tablet 25 mg  25 mg Oral BID Sanda Klein, MD   25 mg at 02/04/14 2158  . mupirocin ointment (BACTROBAN) 2 %   Nasal BID Sanda Klein, MD   1 application at 76/73/41 1253  . niacin (NIASPAN) CR tablet 500 mg  500 mg Oral QHS Henretta Quist, MD   500 mg at 02/04/14 2158  . ondansetron (ZOFRAN) injection 4 mg  4 mg Intravenous Q6H PRN Kristina Bertone, MD      . sertraline (ZOLOFT) tablet 25 mg  25 mg Oral q morning - 10a Danne Scardina, MD      . tamsulosin (FLOMAX) capsule 0.4 mg  0.4 mg Oral QHS Rosilyn Coachman, MD   0.4 mg at 02/04/14 2157  . tiotropium (SPIRIVA) inhalation capsule 18 mcg  18 mcg Inhalation q morning - 10a Yuritzy Zehring, MD        Physical Exam: General appearance: alert, cooperative and no distress Neck: no adenopathy, no carotid bruit, no JVD, supple, symmetrical, trachea midline and thyroid not enlarged, symmetric, no tenderness/mass/nodules Lungs: clear to  auscultation bilaterally Heart: regular rate and rhythm, S1, S2 normal, no murmur, click, rub or gallop Abdomen: soft, non-tender; bowel sounds normal; no masses,  no organomegaly Extremities: extremities normal, atraumatic, no cyanosis or edema Pulses: 2+ and symmetric Skin: Skin color, texture, turgor normal. No rashes or lesions Neurologic: Grossly normal  Hard of hearing.  Lab Results: Results for orders placed during the hospital encounter of 02/04/14 (from the past 48 hour(s))  SURGICAL PCR SCREEN     Status: None   Collection Time    02/04/14 12:21 PM      Result Value Range   MRSA, PCR NEGATIVE  NEGATIVE   Staphylococcus aureus NEGATIVE  NEGATIVE   Comment:            The Xpert SA Assay (FDA     approved for NASAL specimens     in patients over 78 years of age),     is one component of     a comprehensive surveillance     program.  Test performance has     been validated by [employer] for patients greater     than or equal to 77 year old.     It is not intended     to diagnose infection nor to  guide or monitor treatment.    Imaging: Imaging results have been reviewed and Dg Chest 2 View  02/05/2014   CLINICAL DATA:  Status post pacemaker placement  EXAM: CHEST  2 VIEW  COMPARISON:  11/08/2013  FINDINGS: Cardiac shadow is stable. Postoperative changes are again seen. A pacing device is now noted. No pneumothorax is seen. The lungs are clear bilaterally. No bony abnormality is noted.  IMPRESSION: No evidence of post procedure pneumothorax.   Electronically Signed   By: Inez Catalina M.D.   On: 02/05/2014 07:16    Assessment:  1. Active Problems: 2.   Pacemaker 3.   Plan:  1. Lead parameters are good. Atrial P wave 4 mV, imp 537 ohm, threshold 0.4V@0 .41ms Ventr R wave 11.2 mV, imp 798 ohm, threshold 0.5V@0 .91ms  2. Normal CXR findings 3. Surgical sites healthy.  DC home, sling x 2 wks, wound care and arm restrictions discussed, wound check 7-10 days,  office appt with me and pacer check in 30 days.  Time Spent Directly with Patient:  30 minutes  Length of Stay:  LOS: 1 day    Xana Bradt 02/05/2014, 8:36 AM

## 2014-02-10 ENCOUNTER — Ambulatory Visit (INDEPENDENT_AMBULATORY_CARE_PROVIDER_SITE_OTHER): Payer: Medicare Other | Admitting: Cardiology

## 2014-02-10 VITALS — BP 152/90 | HR 68 | Ht 70.0 in | Wt 183.0 lb

## 2014-02-10 DIAGNOSIS — Z950 Presence of cardiac pacemaker: Secondary | ICD-10-CM

## 2014-02-11 ENCOUNTER — Encounter: Payer: Self-pay | Admitting: Cardiology

## 2014-02-11 NOTE — Progress Notes (Signed)
Patient ID: TESLA BOCHICCHIO, male   DOB: [birth year], 80 y.o.   MRN: [id number]  02/11/2014 Zohan Shiflet Knezevic   [birth year]  [id number]  Primary Physicia HAMRICK,MAURA L, MD Primary Cardiologist: Dr. Sallyanne Kuster  HPI:  The patient is a 80 y/o WM who presents to clinic for wound check/ staple removal, following insertion of a PPM for high grade second degree AV block on 02/03/14. He denies any pain, swelling, erythema, drainage, fever, chills, n/v. He has been compliant with his left arm sling and has avoided overhead activities. He denies CP/SOB. He has resumed his Eliquis.    Current Outpatient Prescriptions  Medication Sig Dispense Refill  . acetaminophen (TYLENOL) 325 MG tablet Take 1-2 tablets (325-650 mg total) by mouth every 4 (four) hours as needed for mild pain.      Marland Kitchen apixaban (ELIQUIS) 5 MG TABS tablet Take 1 tablet (5 mg total) by mouth every 12 (twelve) hours.  180 tablet  3  . atorvastatin (LIPITOR) 40 MG tablet Take 40 mg by mouth every evening.      . budesonide-formoterol (SYMBICORT) 80-4.5 MCG/ACT inhaler Inhale 2 puffs into the lungs 2 (two) times daily.      . diphenhydramine-acetaminophen (TYLENOL PM) 25-500 MG TABS Take 2 tablets by mouth at bedtime.       . ENSURE (ENSURE) Take 1 Can by mouth every morning.      . loratadine (CLARITIN) 10 MG tablet Take 10 mg by mouth daily as needed for allergies.       . metoprolol tartrate (LOPRESSOR) 25 MG tablet Take 1 tablet (25 mg total) by mouth 2 (two) times daily.  60 tablet  6  . midodrine (PROAMATINE) 5 MG tablet Take 1 tablet (5 mg total) by mouth 3 (three) times daily. One by mouth three times daily  270 tablet  3  . Multiple Vitamins-Minerals ([employer]S SPECTRAVITE SENIOR) TABS Take 1 tablet by mouth every morning.       . niacin (NIASPAN) 500 MG CR tablet Take 1 tablet (500 mg total) by mouth at bedtime.  30 tablet  11  . Probiotic Product ([employer]S SENIOR PROBIOTIC PO) Take 1 capsule by mouth at bedtime. Once daily      . sertraline  (ZOLOFT) 25 MG tablet Take 25 mg by mouth every morning.      . Tamsulosin HCl (FLOMAX) 0.4 MG CAPS Take 0.4 mg by mouth at bedtime.       Marland Kitchen tiotropium (SPIRIVA) 18 MCG inhalation capsule Place 18 mcg into inhaler and inhale every morning.       No current facility-administered medications for this visit.    No Known Allergies  History   Social History  . Marital Status: Married    Spouse Name: N/A    Number of Children: N/A  . Years of Education: N/A   Occupational History  . retired long distance Administrator   . maintenence    Social History Main Topics  . Smoking status: Former Smoker -- 1.00 packs/day for 47 years    Types: Cigarettes    Quit date: 12/30/1993  . Smokeless tobacco: Never Used  . Alcohol Use: No  . Drug Use: No  . Sexual Activity: Not on file   Other Topics Concern  . Not on file   Social History Narrative  . No narrative on file     Review of Systems: General: negative for chills, fever, night sweats or weight changes.  Cardiovascular: negative for chest pain, dyspnea on exertion,  edema, orthopnea, palpitations, paroxysmal nocturnal dyspnea or shortness of breath Dermatological: negative for rash Respiratory: negative for cough or wheezing Urologic: negative for hematuria Abdominal: negative for nausea, vomiting, diarrhea, bright red blood per rectum, melena, or hematemesis Neurologic: negative for visual changes, syncope, or dizziness All other systems reviewed and are otherwise negative except as noted above.    Blood pressure 152/90, pulse 68, height 5\' 10"  (1.778 m), weight 183 lb (83.008 kg).  General appearance: alert, cooperative and no distress Neck: no carotid bruit and no JVD Lungs: clear to auscultation bilaterally Heart: regular rate and rhythm Extremities: no LEE Pulses: 2+ and symmetric Skin: warm and dry: the skin overlying the pacemaker is intact, there is mild erythema, but there is no swelling and no drainage. the area is  nontender Neurologic: Grossly normal    ASSESSMENT AND PLAN:   Pacemaker- MDT 02/04/14 Day 7 s/p PPM insertion. The skin overlying the pacemaker is intact, there is mild erythema, but there is no swelling and no drainage. The area is non tender. His staples were removed without difficulty. Betadine was applied to the area.     PLAN  Pt will f/u with Dr. Sallyanne Kuster for pacemaker check/interogation in 4 weeks. Pt advised to continue in left arm sling for an additional week to avoid lead dislodgement. Pt also advised to refrain from standing water including bathtubs, hot tubs and swimming pools until seen by Dr. Sallyanne Kuster. He was instructed to contact our office if he developers any signs of infection.   SIMMONS, BRITTAINYPA-C 02/11/2014 2:10 PM

## 2014-02-11 NOTE — Assessment & Plan Note (Signed)
Day 7 s/p PPM insertion. The skin overlying the pacemaker is intact, there is mild erythema, but there is no swelling and no drainage. The area is non tender. His staples were removed without difficulty. Betadine was applied to the area.

## 2014-03-02 ENCOUNTER — Other Ambulatory Visit: Payer: Self-pay | Admitting: Cardiovascular Disease

## 2014-03-02 NOTE — Telephone Encounter (Signed)
Rx was sent to pharmacy electronically. 

## 2014-03-03 ENCOUNTER — Encounter: Payer: Self-pay | Admitting: Cardiovascular Disease

## 2014-03-10 ENCOUNTER — Encounter: Payer: Self-pay | Admitting: Cardiovascular Disease

## 2014-03-10 ENCOUNTER — Ambulatory Visit (INDEPENDENT_AMBULATORY_CARE_PROVIDER_SITE_OTHER): Payer: Medicare Other | Admitting: Cardiovascular Disease

## 2014-03-10 VITALS — BP 130/60 | HR 72 | Resp 16 | Ht 70.0 in | Wt 182.7 lb

## 2014-03-10 DIAGNOSIS — Z950 Presence of cardiac pacemaker: Secondary | ICD-10-CM

## 2014-03-10 DIAGNOSIS — I4430 Unspecified atrioventricular block: Secondary | ICD-10-CM | POA: Diagnosis not present

## 2014-03-10 DIAGNOSIS — Z951 Presence of aortocoronary bypass graft: Secondary | ICD-10-CM | POA: Diagnosis not present

## 2014-03-10 DIAGNOSIS — I4892 Unspecified atrial flutter: Secondary | ICD-10-CM

## 2014-03-10 LAB — PACEMAKER DEVICE OBSERVATION

## 2014-03-10 LAB — MDC_IDC_ENUM_SESS_TYPE_INCLINIC
Battery Impedance: 100 Ohm
Battery Voltage: 2.8 V
Brady Statistic AS VP Percent: 0 %
Brady Statistic AS VS Percent: 97 %
Lead Channel Impedance Value: 493 Ohm
Lead Channel Impedance Value: 620 Ohm
Lead Channel Pacing Threshold Pulse Width: 0.4 ms
Lead Channel Sensing Intrinsic Amplitude: 15.68 mV
Lead Channel Setting Pacing Amplitude: 2 V
Lead Channel Setting Pacing Amplitude: 2.5 V
Lead Channel Setting Sensing Sensitivity: 4 mV
MDC IDC MSMT BATTERY REMAINING LONGEVITY: 167 mo
MDC IDC MSMT LEADCHNL RA PACING THRESHOLD AMPLITUDE: 0.75 V
MDC IDC MSMT LEADCHNL RA PACING THRESHOLD PULSEWIDTH: 0.4 ms
MDC IDC MSMT LEADCHNL RA SENSING INTR AMPL: 5.6 mV
MDC IDC MSMT LEADCHNL RV PACING THRESHOLD AMPLITUDE: 0.5 V
MDC IDC SESS DTM: 20150312181504
MDC IDC SET LEADCHNL RV PACING PULSEWIDTH: 0.4 ms
MDC IDC STAT BRADY AP VP PERCENT: 0 %
MDC IDC STAT BRADY AP VS PERCENT: 3 %

## 2014-03-10 NOTE — Patient Instructions (Signed)
Remote monitoring is used to monitor your pacemaker from home. This monitoring reduces the number of office visits required to check your device to one time per year. It allows us to keep an eye on the functioning of your device to ensure it is working properly. You are scheduled for a device check from home on 06-13-2014. You may send your transmission at any time that day. If you have a wireless device, the transmission will be sent automatically. After your physician reviews your transmission, you will receive a postcard with your next transmission date.  Your physician recommends that you schedule a follow-up appointment in: 12 months with Dr.Croitoru    

## 2014-03-13 ENCOUNTER — Encounter: Payer: Self-pay | Admitting: Cardiovascular Disease

## 2014-03-13 NOTE — Assessment & Plan Note (Signed)
No current symptoms of coronary disease. He'll followup with Dr. Claiborne Billings.

## 2014-03-13 NOTE — Assessment & Plan Note (Signed)
Normal device function. Device is now programmed to chronic settings. He will follow up the other care link system.

## 2014-03-13 NOTE — Assessment & Plan Note (Signed)
No recent events. 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 He is on anticoagulant therapy.

## 2014-03-13 NOTE — Progress Notes (Addendum)
Patient ID: [name], male   DOB: 1929/08/24, 80 y.o.   MRN: [id number]      Reason for office visit CAD, atrial fibrillation, pacemaker check  [name] is a patient of Dr. Ellouise Newer who has an extensive history of coronary problems but returns in followup roughly 2 months after implantation of a pacemaker for paroxysmal high-grade AV block with pauses of greater than 3 seconds in duration. He has not had any more syncope since the device was implanted. His device has recorded a brief episode of atrial tachycardia, lasting less than 2 minutes. This actually happened earlier today and was asymptomatic. No atrophic fibrillation has been noted. Device function is otherwise normal with good lead parameters. The site has healed well. He has no complaints today   No Known Allergies  Current Outpatient Prescriptions  Medication Sig Dispense Refill  . acetaminophen (TYLENOL) 325 MG tablet Take 1-2 tablets (325-650 mg total) by mouth every 4 (four) hours as needed for mild pain.      Marland Kitchen apixaban (ELIQUIS) 5 MG TABS tablet Take 1 tablet (5 mg total) by mouth every 12 (twelve) hours.  180 tablet  3  . atorvastatin (LIPITOR) 40 MG tablet Take 40 mg by mouth every evening.      . budesonide-formoterol (SYMBICORT) 80-4.5 MCG/ACT inhaler Inhale 2 puffs into the lungs 2 (two) times daily.      . diphenhydramine-acetaminophen (TYLENOL PM) 25-500 MG TABS Take 2 tablets by mouth at bedtime.       . ENSURE (ENSURE) Take 1 Can by mouth every morning.      . metoprolol tartrate (LOPRESSOR) 25 MG tablet TAKE 1 TABLET BY MOUTH TWICE A DAY  60 tablet  11  . midodrine (PROAMATINE) 5 MG tablet Take 1 tablet (5 mg total) by mouth 3 (three) times daily. One by mouth three times daily  270 tablet  3  . Multiple Vitamins-Minerals (CVS SPECTRAVITE SENIOR) TABS Take 1 tablet by mouth every morning.       . niacin (NIASPAN) 500 MG CR tablet Take 1 tablet (500 mg total) by mouth at bedtime.  30 tablet  11  . Probiotic  Product (CVS SENIOR PROBIOTIC PO) Take 1 capsule by mouth at bedtime. Once daily      . sertraline (ZOLOFT) 25 MG tablet Take 25 mg by mouth every morning.      . Tamsulosin HCl (FLOMAX) 0.4 MG CAPS Take 0.4 mg by mouth at bedtime.       Marland Kitchen tiotropium (SPIRIVA) 18 MCG inhalation capsule Place 18 mcg into inhaler and inhale every morning.       No current facility-administered medications for this visit.    Past Medical History  Diagnosis Date  . Coronary heart disease     2D Echo, 04/13/2013 - EF 55-60%, moderate regurg of the tricuspid valve  . COPD (chronic obstructive pulmonary disease)   . MGUS (monoclonal gammopathy of unknown significance) 12/05/2011  . Anemia of renal disease 12/05/2011  . Anemia, iron deficiency 12/05/2011  . CAS (cerebral atherosclerosis)     Carotid Dopplers, 04/09/2012 - Bilateral Bulb/Proximal ICAs-mild to moderate amount of fibrous plaque of fibrous soft plaque w/o evidence of a significant diameter reduction, dissection, or any other vascular abnormality  . Nonspecific ST-T wave electrocardiographic changes     Lexiscan, 06/02/2012 - EKG negative for ischemia, compared to previous study-there is no significant change, normal myocardial perfusion study  . Asthma   . Dysrhythmia 11/09/2013    SVT VS  A FLUTTER   . Shortness of breath   . Kidney stones     Past Surgical History  Procedure Laterality Date  . Cardiac catheterization  12/05/2005    Recommended CABG  . Coronary artery bypass graft  12/06/2005    x4, LIMA to LAD, vein to distal circumflex, sequential vein to intermediate and obtuse marginal vessel  . Kidney stone surgery    . Inner ear surgery    . Cataract extraction    . Tee without cardioversion N/A 11/09/2013    Procedure: TRANSESOPHAGEAL ECHOCARDIOGRAM (TEE);  Surgeon: Pixie Casino, MD;  Location: [facility] ENDOSCOPY;  Service: Cardiovascular;  Laterality: N/A;  . Cardioversion N/A 11/09/2013    Procedure: CARDIOVERSION;  Surgeon: Pixie Casino, MD;  Location[facility] ENDOSCOPY;  Service: Cardiovascular;  Laterality: N/A;    Family History  Problem Relation Age of Onset  . Tuberculosis Father   . Heart attack Father   . Heart disease Mother   . Heart attack Mother   . Heart disease Brother   . Emphysema Daughter     premature without tobacco exposure    History   Social History  . Marital Status: Married    Spouse Name: N/A    Number of Children: N/A  . Years of Education: N/A   Occupational History  . retired long distance Administrator   . maintenence    Social History Main Topics  . Smoking status: Former Smoker -- 1.00 packs/day for 47 years    Types: Cigarettes    Quit date: 12/30/1993  . Smokeless tobacco: Never Used  . Alcohol Use: No  . Drug Use: No  . Sexual Activity: Not on file   Other Topics Concern  . Not on file   Social History Narrative  . No narrative on file    Review of systems: The patient specifically denies any chest pain at rest or with exertion, dyspnea at rest or with exertion, orthopnea, paroxysmal nocturnal dyspnea, palpitations, focal neurological deficits, intermittent claudication, lower extremity edema, unexplained weight gain, cough, hemoptysis or wheezing.  The patient also denies abdominal pain, nausea, vomiting, dysphagia, diarrhea, constipation, polyuria, polydipsia, dysuria, hematuria, frequency, urgency, abnormal bleeding or bruising, fever, chills, unexpected weight changes, mood swings, change in skin or hair texture, change in voice quality, auditory or visual problems, allergic reactions or rashes, new musculoskeletal complaints other than usual "aches and pains".    PHYSICAL EXAM BP 130/60  Pulse 72  Resp 16  Ht 5\' 10"  (1.778 m)  Wt 82.872 kg (182 lb 11.2 oz)  BMI 26.21 kg/m2 General: Alert, oriented x3, no distress  Head: no evidence of trauma, PERRL, EOMI, no exophtalmos or lid lag, no myxedema, no xanthelasma; normal ears, nose and oropharynx  Neck: normal  jugular venous pulsations and no hepatojugular reflux; brisk carotid pulses without delay and no carotid bruits  Chest: clear to auscultation, no signs of consolidation by percussion or palpation, normal fremitus, symmetrical and full respiratory excursions  Cardiovascular: normal position and quality of the apical impulse, regular rhythm, normal first and second heart sounds, no rubs or gallops, 1/6 systolic murmur, likely aortic ejection  Abdomen: no tenderness or distention, no masses by palpation, no abnormal pulsatility or arterial bruits, normal bowel sounds, no hepatosplenomegaly  Extremities: no clubbing, cyanosis or edema; 2+ radial, ulnar and brachial pulses bilaterally; 2+ right femoral, posterior tibial and dorsalis pedis pulses; 2+ left femoral, posterior tibial and dorsalis pedis pulses; no subclavian or femoral bruits  Neurological: grossly  nonfocal     BMET    Component Value Date/Time   NA 140 01/27/2014 1137   K 4.6 01/27/2014 1137   CL 108 01/27/2014 1137   CO2 26 01/27/2014 1137   GLUCOSE 96 01/27/2014 1137   BUN 24* 01/27/2014 1137   CREATININE 1.29 01/27/2014 1137   CREATININE 1.35 11/10/2013 0700   CALCIUM 9.1 01/27/2014 1137   GFRNONAA 47* 11/10/2013 0700   GFRAA 55* 11/10/2013 0700     ASSESSMENT AND PLAN Pacemaker- MDT 02/04/14 Normal device function. Device is now programmed to chronic settings. He will follow up the other care link system.  Atrial flutter No recent events. 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 He is on anticoagulant therapy.  S/P CABG x 4 No current symptoms of coronary disease. He'll followup with Dr. Claiborne Billings.   Patient Instructions  Remote monitoring is used to monitor your pacemaker from home. This monitoring reduces the number of office visits required to check your device to one time per year. It allows [place] to keep an eye on the functioning of your device to ensure it is working  properly. You are scheduled for a device check from home on 06-13-2014. You may send your transmission at any time that day. If you have a wireless device, the transmission will be sent automatically. After your physician reviews your transmission, you will receive a postcard with your next transmission date.  Your physician recommends that you schedule a follow-up appointment in: 12 months with Dr.Essie Gehret      Demetrica Zipp  Sanda Klein, MD, [facility] [phone] office [phone] pager

## 2014-03-17 ENCOUNTER — Ambulatory Visit: Payer: Medicare Other | Admitting: Cardiovascular Disease

## 2014-03-18 ENCOUNTER — Telehealth: Payer: Self-pay | Admitting: Hematology & Oncology

## 2014-03-18 NOTE — Telephone Encounter (Signed)
Left message with new time for 4-3 appointment

## 2014-03-24 DIAGNOSIS — H6120 Impacted cerumen, unspecified ear: Secondary | ICD-10-CM | POA: Diagnosis not present

## 2014-03-24 DIAGNOSIS — H903 Sensorineural hearing loss, bilateral: Secondary | ICD-10-CM | POA: Diagnosis not present

## 2014-03-24 DIAGNOSIS — H905 Unspecified sensorineural hearing loss: Secondary | ICD-10-CM | POA: Diagnosis not present

## 2014-04-01 ENCOUNTER — Ambulatory Visit (HOSPITAL_BASED_OUTPATIENT_CLINIC_OR_DEPARTMENT_OTHER): Payer: Medicare Other | Admitting: Lab

## 2014-04-01 ENCOUNTER — Ambulatory Visit (HOSPITAL_BASED_OUTPATIENT_CLINIC_OR_DEPARTMENT_OTHER): Payer: Medicare Other | Admitting: Hematology & Oncology

## 2014-04-01 ENCOUNTER — Ambulatory Visit: Payer: Medicare Other | Admitting: Hematology & Oncology

## 2014-04-01 ENCOUNTER — Encounter: Payer: Self-pay | Admitting: Hematology & Oncology

## 2014-04-01 ENCOUNTER — Other Ambulatory Visit: Payer: Medicare Other | Admitting: Lab

## 2014-04-01 ENCOUNTER — Ambulatory Visit: Payer: Medicare Other

## 2014-04-01 VITALS — BP 125/60 | HR 81 | Temp 98.4°F | Resp 16 | Ht 69.0 in | Wt 181.0 lb

## 2014-04-01 DIAGNOSIS — D631 Anemia in chronic kidney disease: Secondary | ICD-10-CM | POA: Diagnosis not present

## 2014-04-01 DIAGNOSIS — D649 Anemia, unspecified: Secondary | ICD-10-CM

## 2014-04-01 DIAGNOSIS — D472 Monoclonal gammopathy: Secondary | ICD-10-CM | POA: Diagnosis not present

## 2014-04-01 DIAGNOSIS — D509 Iron deficiency anemia, unspecified: Secondary | ICD-10-CM

## 2014-04-01 DIAGNOSIS — N039 Chronic nephritic syndrome with unspecified morphologic changes: Secondary | ICD-10-CM

## 2014-04-01 DIAGNOSIS — N189 Chronic kidney disease, unspecified: Secondary | ICD-10-CM

## 2014-04-01 DIAGNOSIS — I2510 Atherosclerotic heart disease of native coronary artery without angina pectoris: Secondary | ICD-10-CM

## 2014-04-01 LAB — CBC WITH DIFFERENTIAL (CANCER CENTER ONLY)
BASO#: 0.1 10*3/uL (ref 0.0–0.2)
BASO%: 1.1 % (ref 0.0–2.0)
EOS%: 2 % (ref 0.0–7.0)
Eosinophils Absolute: 0.1 10*3/uL (ref 0.0–0.5)
HCT: 37.5 % — ABNORMAL LOW (ref 38.7–49.9)
HGB: 12.3 g/dL — ABNORMAL LOW (ref 13.0–17.1)
LYMPH#: 0.8 10*3/uL — ABNORMAL LOW (ref 0.9–3.3)
LYMPH%: 18.6 % (ref 14.0–48.0)
MCH: 32.3 pg (ref 28.0–33.4)
MCHC: 32.8 g/dL (ref 32.0–35.9)
MCV: 98 fL (ref 82–98)
MONO#: 0.4 10*3/uL (ref 0.1–0.9)
MONO%: 9.1 % (ref 0.0–13.0)
NEUT#: 3.1 10*3/uL (ref 1.5–6.5)
NEUT%: 69.2 % (ref 40.0–80.0)
PLATELETS: 159 10*3/uL (ref 145–400)
RBC: 3.81 10*6/uL — ABNORMAL LOW (ref 4.20–5.70)
RDW: 12.9 % (ref 11.1–15.7)
WBC: 4.5 10*3/uL (ref 4.0–10.0)

## 2014-04-04 ENCOUNTER — Other Ambulatory Visit: Payer: Self-pay

## 2014-04-04 NOTE — Telephone Encounter (Signed)
Refill request for Sertraline routed to Dr Corky Downs and Bary Leriche, CMA for approval.

## 2014-04-05 ENCOUNTER — Other Ambulatory Visit: Payer: Self-pay | Admitting: *Deleted

## 2014-04-05 LAB — PROTEIN ELECTROPHORESIS, SERUM, WITH REFLEX
Albumin ELP: 53.2 % — ABNORMAL LOW (ref 55.8–66.1)
Alpha-1-Globulin: 6.4 % — ABNORMAL HIGH (ref 2.9–4.9)
Alpha-2-Globulin: 11.7 % (ref 7.1–11.8)
BETA GLOBULIN: 5.8 % (ref 4.7–7.2)
Beta 2: 4.4 % (ref 3.2–6.5)
GAMMA GLOBULIN: 18.5 % (ref 11.1–18.8)
M-SPIKE, %: 0.44 g/dL
Total Protein, Serum Electrophoresis: 6.7 g/dL (ref 6.0–8.3)

## 2014-04-05 LAB — COMPREHENSIVE METABOLIC PANEL
ALT: 35 U/L (ref 0–53)
AST: 27 U/L (ref 0–37)
Albumin: 3.9 g/dL (ref 3.5–5.2)
Alkaline Phosphatase: 89 U/L (ref 39–117)
BILIRUBIN TOTAL: 0.9 mg/dL (ref 0.2–1.2)
BUN: 20 mg/dL (ref 6–23)
CO2: 26 mEq/L (ref 19–32)
Calcium: 9.2 mg/dL (ref 8.4–10.5)
Chloride: 107 mEq/L (ref 96–112)
Creatinine, Ser: 1.26 mg/dL (ref 0.50–1.35)
Glucose, Bld: 103 mg/dL — ABNORMAL HIGH (ref 70–99)
Potassium: 4.5 mEq/L (ref 3.5–5.3)
SODIUM: 140 meq/L (ref 135–145)
TOTAL PROTEIN: 6.7 g/dL (ref 6.0–8.3)

## 2014-04-05 LAB — KAPPA/LAMBDA LIGHT CHAINS
KAPPA LAMBDA RATIO: 2.6 — AB (ref 0.26–1.65)
Kappa free light chain: 17.7 mg/dL — ABNORMAL HIGH (ref 0.33–1.94)
LAMBDA FREE LGHT CHN: 6.82 mg/dL — AB (ref 0.57–2.63)

## 2014-04-05 LAB — IGG, IGA, IGM
IGA: 175 mg/dL (ref 68–379)
IgG (Immunoglobin G), Serum: 1140 mg/dL (ref 650–1600)
IgM, Serum: 398 mg/dL — ABNORMAL HIGH (ref 41–251)

## 2014-04-05 LAB — IFE INTERPRETATION

## 2014-04-08 ENCOUNTER — Ambulatory Visit: Payer: Medicare Other | Admitting: Hematology & Oncology

## 2014-04-08 ENCOUNTER — Ambulatory Visit: Payer: Medicare Other

## 2014-04-08 ENCOUNTER — Other Ambulatory Visit: Payer: Medicare Other | Admitting: Lab

## 2014-04-12 ENCOUNTER — Telehealth: Payer: Self-pay | Admitting: *Deleted

## 2014-04-12 MED ORDER — APIXABAN 5 MG PO TABS: 5.0000 mg | ORAL_TABLET | Freq: Two times a day (BID) | ORAL | Status: DC

## 2014-04-12 NOTE — Telephone Encounter (Signed)
(  Dr. Sallyanne Kuster) Returned call and pt verified x 2 w/ [name], pt's daughter.  Informed message received and samples of Eliquis will be left at the front desk for pt to use until Dr. Loletha Grayer advises if he will change med or if PA will be needed.  Informed Pamala Hurry will be notified.  Verbalized understanding and agreed w/ plan.   (Dr. Claiborne Billings) In regards to Zoloft, informed Mariann Laster denied refill r/t it not being written in office.  Daughter stated Dr. Claiborne Billings has been filling for pt over the past 2 years and she has talked w/ Mariann Laster about this before.  Informed Dr. Arnette Norris will be notified of request for refill.  Also informed this med is not usually rx'd in cardiology and pt may need to contact his PCP.  Daughter stated pt doesn't really have one b/c the name on file pt has only been to the office once and primarily sees specialists for his medical issues.  Daughter again informed they will be notified of the request, but it may not be guaranteed r/t it not being a cardiac med.  Verbalized understanding and agreed w/ plan.  Forwarded to [place], [place] r/t ? PA for Eliquis.  Message forwarded to Mariann Laster, CMA to discuss refill of Zoloft w/ Dr. Claiborne Billings

## 2014-04-12 NOTE — Telephone Encounter (Signed)
Pt's daughter was calling in regards to 2 prescriptions. Dr. Loletha Grayer put him on Eliquis and it is going to cost over $400.00 and he can not afford it. He will be out tonight and they can not refill it and needs to know if there is an alternative. Dr. Claiborne Billings prescribed him Zoloft and he is out of that is well and the pharmacy has faxed over the Rx multiple times[facility] [address]...  MC and TK

## 2014-04-12 NOTE — Telephone Encounter (Signed)
Left message to return a call in reference to patient's request for zoloft.

## 2014-04-14 ENCOUNTER — Telehealth: Payer: Self-pay | Admitting: *Deleted

## 2014-04-14 ENCOUNTER — Other Ambulatory Visit: Payer: Self-pay | Admitting: *Deleted

## 2014-04-14 MED ORDER — SERTRALINE HCL 25 MG PO TABS: 25.0000 mg | ORAL_TABLET | ORAL | Status: DC

## 2014-04-14 NOTE — Telephone Encounter (Signed)
Refilled [facility] to [facility] [address] [facility] [place] [facility] with 0 refills.

## 2014-04-14 NOTE — Telephone Encounter (Signed)
Informed patient's daughter per Dr. Claiborne Billings that he will refill the Zoloft for 1 more month. Afterwards he can no longer prescribe the medication for him. He thought that the patient's depression was being treated by a MD. This is out of our scope. Daughter advised that as patients age some medications may not be appropriate. This will need to be determined by someone other than Dr. Claiborne Billings. Patient's daughter agreed with his decision and will get patient scheduled to see someone before RX runs out in 30 days.

## 2014-04-27 ENCOUNTER — Telehealth: Payer: Self-pay | Admitting: Cardiovascular Disease

## 2014-04-27 NOTE — Telephone Encounter (Signed)
Please have Mariann Laster to cal her,l,she need to talk to her before her father's appt on Friday.

## 2014-04-27 NOTE — Telephone Encounter (Signed)
Returned a call to patient's daughter. She would like for Dr.Kelly to discuss with the patient on Friday @ his appointment the need for him to stop driving. The daughter informs me that he still will have dizzy spells and confusion at times. He is also a "aggressive driver". theyt feel that he should no longer be allowed to drive, however he will not listen to the family's concerns. They feel that he will listen to Dr. Claiborne Billings. I told her that I will let Dr. Claiborne Billings know of their concerns.Marland Kitchen

## 2014-04-28 ENCOUNTER — Encounter: Payer: Self-pay | Admitting: Cardiovascular Disease

## 2014-04-29 ENCOUNTER — Encounter: Payer: Self-pay | Admitting: Cardiovascular Disease

## 2014-04-29 ENCOUNTER — Ambulatory Visit (INDEPENDENT_AMBULATORY_CARE_PROVIDER_SITE_OTHER): Payer: Medicare Other | Admitting: Cardiovascular Disease

## 2014-04-29 VITALS — BP 130/60 | HR 84 | Ht 70.0 in | Wt 178.0 lb

## 2014-04-29 DIAGNOSIS — E785 Hyperlipidemia, unspecified: Secondary | ICD-10-CM | POA: Diagnosis not present

## 2014-04-29 DIAGNOSIS — J438 Other emphysema: Secondary | ICD-10-CM

## 2014-04-29 DIAGNOSIS — R5383 Other fatigue: Secondary | ICD-10-CM | POA: Diagnosis not present

## 2014-04-29 DIAGNOSIS — D472 Monoclonal gammopathy: Secondary | ICD-10-CM

## 2014-04-29 DIAGNOSIS — J449 Chronic obstructive pulmonary disease, unspecified: Secondary | ICD-10-CM

## 2014-04-29 DIAGNOSIS — I4892 Unspecified atrial flutter: Secondary | ICD-10-CM

## 2014-04-29 DIAGNOSIS — R0602 Shortness of breath: Secondary | ICD-10-CM | POA: Diagnosis not present

## 2014-04-29 DIAGNOSIS — R5381 Other malaise: Secondary | ICD-10-CM

## 2014-04-29 DIAGNOSIS — J4489 Other specified chronic obstructive pulmonary disease: Secondary | ICD-10-CM

## 2014-04-29 DIAGNOSIS — Z951 Presence of aortocoronary bypass graft: Secondary | ICD-10-CM

## 2014-04-29 DIAGNOSIS — I2510 Atherosclerotic heart disease of native coronary artery without angina pectoris: Secondary | ICD-10-CM

## 2014-04-29 NOTE — Patient Instructions (Signed)
Your physician recommends that you return for lab work .  Your physician has requested that you have a lexiscan myoview. For further information please visit [email or web address]. Please follow instruction sheet, as given.  Your physician recommends that you schedule a follow-up appointment in: 6 weeks.

## 2014-05-05 ENCOUNTER — Telehealth: Payer: Self-pay | Admitting: *Deleted

## 2014-05-05 ENCOUNTER — Telehealth: Payer: Self-pay | Admitting: Cardiovascular Disease

## 2014-05-05 MED ORDER — RIVAROXABAN 15 MG PO TABS: 15.0000 mg | ORAL_TABLET | Freq: Two times a day (BID) | ORAL | Status: DC

## 2014-05-05 MED ORDER — APIXABAN 5 MG PO TABS: 5.0000 mg | ORAL_TABLET | Freq: Two times a day (BID) | ORAL | Status: DC

## 2014-05-05 NOTE — Telephone Encounter (Signed)
Patient walked in requesting his Eliquis be switched to something cheaper.  Message already sent to Dr. Prescilla Sours, PharmD for advise.

## 2014-05-05 NOTE — Telephone Encounter (Signed)
Daughter wants to know what was decided about his Randa Evens can not afford it.He is just about out of the samples.

## 2014-05-05 NOTE — Telephone Encounter (Signed)
Spoke with [name], patient's daughter. Was calling in regards to [name] cost ($450/month). He was put on this medications post-pacemaker insertion by Dr. Sallyanne Kuster and had a free month trial. Daughter states she had talked to Dr. [name] December nurse and also Wanda/Dr. Claiborne Billings at [facility] with Dr. Claiborne Billings on 5/1 and at that visit was told Dr. Claiborne Billings could switch his anticoagulant and they would be contacted with this information, but never received a call. (** there not a dictated note in EPIC from this visit, nor instructions stating anything about possible med changes). Dr. [name] December nurse Pamala Hurry states that no prior authorization information/request had come in to complete, and Mariann Laster, CMA is out of office. Joann advised RN to call her sister [name] to inquire of drug coverage, which may help direct providers in choosing a cost-effective alternative. RN attempt to call [name] and left VM. RN has provided patient with samples, left at front desk, and [name] will pick them up.   RN called pharmacy  Patient has Part D coverage Medicare and [place] #180 tablets will be $416.56 and #60 $142.54 ** pharmacy staff states "he may be in donut hole." and that no prior authorization is needed, that they are not rejecting the medication.   Will defer to [facility], [place], Dr. Sallyanne Kuster, Tommy Medal, Pharmacist to advise on possible medication change.

## 2014-05-05 NOTE — Telephone Encounter (Signed)
Spoke with dtr [name] - will send Rx to pharmacy for Xarelto 15mg .  CrCl is 51, however pt is on midodrine and recently had a pacemaker placed for paroxysmal high-grade AV block with pauses of greater than 3 seconds in duration. He has not had any more syncope since the device was implanted.    [name] will check on price with pharmacy before making a decision to switch medications.  If Xarelto is priced similar to Eliquis, advised to just stay with Eliquis as family thinks they can budget for the $142/month for now.  Eliquis patient assistance will only cover once pt has spent 3% of their yearly income on prescriptions.  Dtr will check on this to see if he might qualify.

## 2014-05-05 NOTE — Telephone Encounter (Signed)
Will defer to [facility].

## 2014-05-05 NOTE — Telephone Encounter (Signed)
Daughter [name] called back. Stated he Dr. Sallyanne Kuster prefers him to be on Eliquis, they can budget the $142/month if necessary. Would like to see if other NOACs were less costly. Seemed to NOT prefer warfain, given frequency of monitoring, etc

## 2014-05-05 NOTE — Telephone Encounter (Signed)
Faxed PA for xarelto 15mg  BID to [facility] - ordered by Erasmo Downer, PharmD

## 2014-05-05 NOTE — Telephone Encounter (Signed)
Spoke with [name].  She will call insurance to see if he is in "donut hole" Reports he has been on this medication a while and has not had to pay this much.  She will call back with information

## 2014-05-05 NOTE — Telephone Encounter (Signed)
If he is in the donut hole, the manufacturer has a program that can help. [name] has details, I think MCr

## 2014-05-09 ENCOUNTER — Encounter: Payer: Self-pay | Admitting: Cardiovascular Disease

## 2014-05-09 DIAGNOSIS — E785 Hyperlipidemia, unspecified: Secondary | ICD-10-CM | POA: Insufficient documentation
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
## 2014-05-11 ENCOUNTER — Telehealth: Payer: Self-pay | Admitting: Cardiovascular Disease

## 2014-05-11 NOTE — Telephone Encounter (Signed)
Spoke to daughter. [name] states that her father ([name] )will continue with Eliqius instead changing to Xarelto. Daughter states it is only $14 difference. Daughter states patient still have samples.  RN discontinue Xarelto from patient medication list.   .

## 2014-05-11 NOTE — Telephone Encounter (Signed)
Please call,concerning a letter he received from  [facility] concerning his Xarelto please.She said she thought  Eliezer Lofts was the one she spoke to about his medicine.

## 2014-05-24 ENCOUNTER — Other Ambulatory Visit: Payer: Self-pay | Admitting: *Deleted

## 2014-05-24 MED ORDER — ATORVASTATIN CALCIUM 40 MG PO TABS: 40.0000 mg | ORAL_TABLET | Freq: Every evening | ORAL | Status: DC

## 2014-06-09 ENCOUNTER — Telehealth: Payer: Self-pay | Admitting: Cardiovascular Disease

## 2014-06-09 NOTE — Telephone Encounter (Signed)
Daughter wanted to know if patient needs to be npo for labs. RN states he can take medication with with sip of water only prior to blood draw. Daughter verbalized understanding.

## 2014-06-09 NOTE — Telephone Encounter (Signed)
Pr is coming in tomorrow for blood work,she have some questions.

## 2014-06-10 DIAGNOSIS — R5381 Other malaise: Secondary | ICD-10-CM | POA: Diagnosis not present

## 2014-06-10 DIAGNOSIS — I4892 Unspecified atrial flutter: Secondary | ICD-10-CM | POA: Diagnosis not present

## 2014-06-10 DIAGNOSIS — R0602 Shortness of breath: Secondary | ICD-10-CM | POA: Diagnosis not present

## 2014-06-10 DIAGNOSIS — R5383 Other fatigue: Secondary | ICD-10-CM | POA: Diagnosis not present

## 2014-06-10 DIAGNOSIS — E785 Hyperlipidemia, unspecified: Secondary | ICD-10-CM | POA: Diagnosis not present

## 2014-06-10 LAB — CBC
HCT: 36.1 % — ABNORMAL LOW (ref 39.0–52.0)
Hemoglobin: 12.3 g/dL — ABNORMAL LOW (ref 13.0–17.0)
MCH: 31.1 pg (ref 26.0–34.0)
MCHC: 34.1 g/dL (ref 30.0–36.0)
MCV: 91.4 fL (ref 78.0–100.0)
Platelets: 173 10*3/uL (ref 150–400)
RBC: 3.95 MIL/uL — ABNORMAL LOW (ref 4.22–5.81)
RDW: 14.9 % (ref 11.5–15.5)
WBC: 4.2 10*3/uL (ref 4.0–10.5)

## 2014-06-10 LAB — LIPID PANEL
CHOL/HDL RATIO: 3.9 ratio
CHOLESTEROL: 132 mg/dL (ref 0–200)
HDL: 34 mg/dL — ABNORMAL LOW (ref >39)
LDL Cholesterol: 54 mg/dL (ref 0–99)
Triglycerides: 219 mg/dL — ABNORMAL HIGH (ref <150)
VLDL: 44 mg/dL — AB (ref 0–40)

## 2014-06-10 LAB — COMPREHENSIVE METABOLIC PANEL
ALK PHOS: 83 U/L (ref 39–117)
ALT: 33 U/L (ref 0–53)
AST: 26 U/L (ref 0–37)
Albumin: 3.9 g/dL (ref 3.5–5.2)
BUN: 16 mg/dL (ref 6–23)
CO2: 25 mEq/L (ref 19–32)
Calcium: 9.2 mg/dL (ref 8.4–10.5)
Chloride: 107 mEq/L (ref 96–112)
Creat: 1.3 mg/dL (ref 0.50–1.35)
Glucose, Bld: 103 mg/dL — ABNORMAL HIGH (ref 70–99)
POTASSIUM: 4.5 meq/L (ref 3.5–5.3)
SODIUM: 142 meq/L (ref 135–145)
TOTAL PROTEIN: 6.8 g/dL (ref 6.0–8.3)
Total Bilirubin: 1 mg/dL (ref 0.2–1.2)

## 2014-06-10 LAB — TSH: TSH: 4.123 u[IU]/mL (ref 0.350–4.500)

## 2014-06-11 LAB — BRAIN NATRIURETIC PEPTIDE: Brain Natriuretic Peptide: 170.3 pg/mL — ABNORMAL HIGH (ref 0.0–100.0)

## 2014-06-13 ENCOUNTER — Ambulatory Visit (INDEPENDENT_AMBULATORY_CARE_PROVIDER_SITE_OTHER): Payer: Medicare Other | Admitting: *Deleted

## 2014-06-13 ENCOUNTER — Encounter: Payer: Self-pay | Admitting: Cardiovascular Disease

## 2014-06-13 DIAGNOSIS — Z950 Presence of cardiac pacemaker: Secondary | ICD-10-CM | POA: Diagnosis not present

## 2014-06-13 DIAGNOSIS — I4892 Unspecified atrial flutter: Secondary | ICD-10-CM | POA: Diagnosis not present

## 2014-06-13 LAB — MDC_IDC_ENUM_SESS_TYPE_REMOTE
Battery Remaining Longevity: 161 mo
Brady Statistic AP VS Percent: 10.2 %
Brady Statistic AS VP Percent: 0.2 %
Lead Channel Impedance Value: 484 Ohm
Lead Channel Impedance Value: 623 Ohm
Lead Channel Pacing Threshold Amplitude: 0.5 V
Lead Channel Pacing Threshold Pulse Width: 0.4 ms
Lead Channel Pacing Threshold Pulse Width: 0.4 ms
Lead Channel Sensing Intrinsic Amplitude: 11.2 mV
Lead Channel Setting Pacing Amplitude: 2 V
Lead Channel Setting Pacing Amplitude: 2.5 V
Lead Channel Setting Sensing Sensitivity: 4 mV
MDC IDC MSMT BATTERY VOLTAGE: 2.8 V
MDC IDC MSMT LEADCHNL RA PACING THRESHOLD AMPLITUDE: 0.75 V
MDC IDC MSMT LEADCHNL RA SENSING INTR AMPL: 2.8 mV
MDC IDC SET LEADCHNL RV PACING PULSEWIDTH: 0.4 ms
MDC IDC STAT BRADY AP VP PERCENT: 0.1 %
MDC IDC STAT BRADY AS VS PERCENT: 89.6 %

## 2014-06-14 ENCOUNTER — Telehealth (HOSPITAL_COMMUNITY): Payer: Self-pay

## 2014-06-14 ENCOUNTER — Encounter (HOSPITAL_COMMUNITY): Payer: Medicare Other

## 2014-06-14 NOTE — Progress Notes (Signed)
Remote pacemaker transmission.   

## 2014-06-16 ENCOUNTER — Ambulatory Visit (HOSPITAL_COMMUNITY)
Admission: RE | Admit: 2014-06-16 | Discharge: 2014-06-16 | Disposition: A | Discharge status: Home / Self Care | Payer: Medicare Other | Source: Ambulatory Visit | Attending: Cardiovascular Disease | Admitting: Cardiovascular Disease

## 2014-06-16 VITALS — Ht 70.0 in | Wt 178.0 lb

## 2014-06-16 DIAGNOSIS — R42 Dizziness and giddiness: Secondary | ICD-10-CM | POA: Insufficient documentation

## 2014-06-16 DIAGNOSIS — I2510 Atherosclerotic heart disease of native coronary artery without angina pectoris: Secondary | ICD-10-CM | POA: Insufficient documentation

## 2014-06-16 DIAGNOSIS — R0602 Shortness of breath: Secondary | ICD-10-CM

## 2014-06-16 DIAGNOSIS — I779 Disorder of arteries and arterioles, unspecified: Secondary | ICD-10-CM | POA: Diagnosis not present

## 2014-06-16 DIAGNOSIS — Z951 Presence of aortocoronary bypass graft: Secondary | ICD-10-CM | POA: Diagnosis not present

## 2014-06-16 DIAGNOSIS — I10 Essential (primary) hypertension: Secondary | ICD-10-CM | POA: Diagnosis not present

## 2014-06-16 DIAGNOSIS — I4891 Unspecified atrial fibrillation: Secondary | ICD-10-CM | POA: Diagnosis not present

## 2014-06-16 DIAGNOSIS — I4892 Unspecified atrial flutter: Secondary | ICD-10-CM | POA: Diagnosis not present

## 2014-06-16 DIAGNOSIS — Z87891 Personal history of nicotine dependence: Secondary | ICD-10-CM | POA: Insufficient documentation

## 2014-06-16 DIAGNOSIS — Z950 Presence of cardiac pacemaker: Secondary | ICD-10-CM

## 2014-06-16 DIAGNOSIS — R0609 Other forms of dyspnea: Secondary | ICD-10-CM | POA: Insufficient documentation

## 2014-06-16 DIAGNOSIS — J4489 Other specified chronic obstructive pulmonary disease: Secondary | ICD-10-CM | POA: Insufficient documentation

## 2014-06-16 DIAGNOSIS — Z8249 Family history of ischemic heart disease and other diseases of the circulatory system: Secondary | ICD-10-CM | POA: Insufficient documentation

## 2014-06-16 DIAGNOSIS — R0989 Other specified symptoms and signs involving the circulatory and respiratory systems: Secondary | ICD-10-CM | POA: Insufficient documentation

## 2014-06-16 DIAGNOSIS — J449 Chronic obstructive pulmonary disease, unspecified: Secondary | ICD-10-CM | POA: Insufficient documentation

## 2014-06-16 MED ORDER — TECHNETIUM TC 99M SESTAMIBI GENERIC - CARDIOLITE
9.6000 | Freq: Once | INTRAVENOUS | Status: AC | PRN
  Administered 2014-06-16: 10 via INTRAVENOUS

## 2014-06-16 MED ORDER — REGADENOSON 0.4 MG/5ML IV SOLN
0.4000 mg | Freq: Once | INTRAVENOUS | Status: AC
  Administered 2014-06-16: 0.4 mg via INTRAVENOUS

## 2014-06-16 MED ORDER — TECHNETIUM TC 99M SESTAMIBI GENERIC - CARDIOLITE
30.5000 | Freq: Once | INTRAVENOUS | Status: AC | PRN
  Administered 2014-06-16: 31 via INTRAVENOUS

## 2014-06-16 NOTE — Procedures (Addendum)
[facility] [address] [place] [address] [phone]  Cardiology Nuclear Med Study  NAOL ONTIVEROS is a 80 y.o. male     MRN : [id number]     DOB: September 22, 1929  Procedure Date: 06/16/2014  Nuclear Med Background Indication for Stress Test:  Graft Patency History:  Asthma, COPD and CAD;CABG X4-11/2005;PACER;SSS;PSVT;Positive tilt table test;AFIB-TEE w/cardioversion;Last NUC MPI on 06/02/2012-nonischemic;EF=53% Cardiac Risk Factors: Carotid Disease, Family History - CAD, History of Smoking, Hypertension, Lipids, Overweight and cerebral athersclerosis  Symptoms:  Dizziness, DOE and Light-Headedness   Nuclear Pre-Procedure Caffeine/Decaff Intake:  1:00am NPO After: 11am   IV Site: R Forearm  IV 0.9% NS with Angio Cath:  22g  Chest Size (in):  48"  IV Started by: Rolene Course, RN  Height: 5\' 10"  (1.778 m)  Cup Size: n/a  BMI:  Body mass index is 25.54 kg/(m^2). Weight:  178 lb (80.74 kg)   Tech Comments:  n/a    Nuclear Med Study 1 or 2 day study: 1 day  Stress Test Type:  [place] Provider:  Shelva Majestic, MD   Resting Radionuclide: Technetium 31m Sestamibi  Resting Radionuclide Dose: 9.6 mCi   Stress Radionuclide:  Technetium 28m Sestamibi  Stress Radionuclide Dose: 30.5 mCi           Stress Protocol Rest HR: 62 Stress HR: 71  Rest BP: 158/84 Stress BP: 156/82  Exercise Time (min): n/a METS: n/a   Predicted Max HR: 138 bpm % Max HR: 55.8 bpm Rate Pressure Product: 12166  Dose of Adenosine (mg):  n/a Dose of Lexiscan: 0.4 mg  Dose of Atropine (mg): n/a Dose of Dobutamine: n/a mcg/kg/min (at max HR)  Stress Test Technologist: Leane Para, CCT Nuclear Technologist: Imagene Riches, CNMT   Rest Procedure:  Myocardial perfusion imaging was performed at rest 45 minutes following the intravenous administration of Technetium 2m Sestamibi. Stress Procedure:  The patient received IV Lexiscan  0.4 mg over 15-seconds.  Technetium 74m Sestamibi injected IV at 30-seconds.  There were no significant changes with Lexiscan.  Quantitative spect images were obtained after a 45 minute delay.  Transient Ischemic Dilatation (Normal <1.22):  1.05  QGS EDV:  n/a ml QGS ESV:  n/a ml LV Ejection Fraction: Study not gated    Rest ECG: Atrial Fibrilliation  Stress ECG: No significant change from baseline ECG  QPS Raw Data Images:  Normal; no motion artifact; normal heart/lung ratio. Stress Images:  Normal homogeneous uptake in all areas of the myocardium. Rest Images:  Normal homogeneous uptake in all areas of the myocardium. Subtraction (SDS):  Normal  Impression Exercise Capacity:  Lexiscan with no exercise. BP Response:  Normal blood pressure response. Clinical Symptoms:  No symptoms. ECG Impression:  No significant ST segment change suggestive of ischemia. Comparison with Prior Nuclear Study: No significant change from previous study  Overall Impression:  Normal stress nuclear study.  LV Wall Motion:  Not gated due to atrial fibrillation   KELLY,THOMAS A, MD  06/16/2014 6:20 PM

## 2014-06-29 ENCOUNTER — Ambulatory Visit: Payer: Medicare Other | Admitting: Cardiovascular Disease

## 2014-07-05 ENCOUNTER — Encounter: Payer: Self-pay | Admitting: *Deleted

## 2014-07-07 NOTE — Telephone Encounter (Signed)
Encounter complete. 

## 2014-07-13 ENCOUNTER — Encounter: Payer: Self-pay | Admitting: Cardiology

## 2014-07-18 ENCOUNTER — Other Ambulatory Visit: Payer: Self-pay | Admitting: Emergency Medicine

## 2014-07-22 ENCOUNTER — Telehealth: Payer: Self-pay | Admitting: Emergency Medicine

## 2014-07-22 MED ORDER — TIOTROPIUM BROMIDE MONOHYDRATE 18 MCG IN CAPS: 18.0000 ug | ORAL_CAPSULE | RESPIRATORY_TRACT | Status: DC

## 2014-07-22 NOTE — Telephone Encounter (Signed)
Called spoke with daughter. RX sent. Nothing further needed

## 2014-08-05 DIAGNOSIS — H353190 Nonexudative age-related macular degeneration, unspecified eye, stage unspecified: Secondary | ICD-10-CM | POA: Diagnosis not present

## 2014-08-05 DIAGNOSIS — Z961 Presence of intraocular lens: Secondary | ICD-10-CM | POA: Diagnosis not present

## 2014-08-26 ENCOUNTER — Encounter: Payer: Self-pay | Admitting: Emergency Medicine

## 2014-08-26 ENCOUNTER — Ambulatory Visit (INDEPENDENT_AMBULATORY_CARE_PROVIDER_SITE_OTHER): Payer: Medicare Other | Admitting: Emergency Medicine

## 2014-08-26 VITALS — BP 116/64 | HR 77 | Ht 70.0 in | Wt 181.0 lb

## 2014-08-26 DIAGNOSIS — Z23 Encounter for immunization: Secondary | ICD-10-CM | POA: Diagnosis not present

## 2014-08-26 DIAGNOSIS — I2510 Atherosclerotic heart disease of native coronary artery without angina pectoris: Secondary | ICD-10-CM

## 2014-08-26 DIAGNOSIS — J449 Chronic obstructive pulmonary disease, unspecified: Secondary | ICD-10-CM

## 2014-08-26 MED ORDER — TIOTROPIUM BROMIDE MONOHYDRATE 18 MCG IN CAPS: 18.0000 ug | ORAL_CAPSULE | RESPIRATORY_TRACT | Status: DC

## 2014-08-26 MED ORDER — BUDESONIDE-FORMOTEROL FUMARATE 80-4.5 MCG/ACT IN AERO: 2.0000 | INHALATION_SPRAY | Freq: Two times a day (BID) | RESPIRATORY_TRACT | Status: DC

## 2014-08-26 NOTE — Progress Notes (Signed)
80 yo man with COPD, CAD/CABG (06) followed by Dr Claiborne Billings.   ROV 09/10/12 -- COPD, allergic rhinitis and associated cough. Not on nasonex right now. He is on loratadine, spiriva + symbicort. Doing well. Last AE was this Spring. Needs the flu shot.   ROV 03/18/13 -- COPD, allergies, CAD, MGUS ([place]). Has been doing well since last time. Never uses SABA. No exacerbations since last time.  He follows with Dr Claiborne Billings for TTE 04/13/13.  On loratadine, allergies well control.   ROV 08/26/13 -- COPD, allergies, CAD. Hx cough. He has noticed more exertional SOB for last 5-6 months. 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 He is on spiriva and symbicort. He was asked by Dr Claiborne Billings to back off some on his aerobic exercise and at the [employer]. Wt is stable. Very little cough, no wheeze. Never uses albuterol. He had an episode of syncope [place] bowling alley [place] > daughter reports that he had SVT, started on metoprolol.   ROV 10/07/13 -- COPD and allergic rhinitis, cough. Last time we ordered repeat PFT due to worsening SOB. The breathing is still about the same - not as good as 1-2 years ago. His PFT show stable spirometry. The lung volumes show a decreased TLC compared with 2009.   ROV 08/26/14 -- COPD and allergic rhinitis, cough. He is doing well - continues to stay active. He has slowed down some but very little. 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 He is on Spiriva and Symbicort.  No flares, cough, wheeze.    CAT Score 08/26/2014 03/18/2013  Total CAT Score 13 6      EXAM :  Filed Vitals:   08/26/14 1119  BP: 116/64  Pulse: 77  Height: 5\' 10"  (1.778 m)  Weight: 181 lb (82.101 kg)  SpO2: 99%   Gen: Pleasant,  in no distress,  normal affect  ENT: No lesions,  mouth clear,  oropharynx clear, no postnasal drip  Neck: No JVD, no TMG, no carotid bruits  Lungs: No use of accessory muscles, distant, no wheeze or crackles, diminshed BS in  bases   Cardiovascular: RRR, heart sounds normal, no murmur or gallops, no peripheral edema  Musculoskeletal: No deformities, no cyanosis or clubbing  Neuro: alert, non focal  Skin: Warm, no lesions or rashes   C O P D He appears to be clinically stable. No flares or new issues.   Please continue your inhaled medications as you have been taking them. We will refill these today We will give you the Prevnar-13 pneumonia shot today Follow with Dr Lamonte Sakai in 6 months or sooner if you have any problems

## 2014-08-26 NOTE — Addendum Note (Signed)
Addended by: Maurice March on: 08/26/2014 12:25 PM   Modules accepted: Orders

## 2014-08-26 NOTE — Assessment & Plan Note (Signed)
He appears to be clinically stable. No flares or new issues.   Please continue your inhaled medications as you have been taking them. We will refill these today We will give you the Prevnar-13 pneumonia shot today Follow with Dr Lamonte Sakai in 6 months or sooner if you have any problems

## 2014-08-26 NOTE — Patient Instructions (Signed)
Please continue your inhaled medications as you have been taking them. We will refill these today We will give you the Prevnar-13 pneumonia shot today Follow with Dr Lamonte Sakai in 6 months or sooner if you have any problems

## 2014-08-26 NOTE — Addendum Note (Signed)
Addended by: Maurice March on: 08/26/2014 03:46 PM   Modules accepted: Orders

## 2014-09-12 ENCOUNTER — Other Ambulatory Visit: Payer: Self-pay | Admitting: Cardiovascular Disease

## 2014-09-14 ENCOUNTER — Ambulatory Visit (INDEPENDENT_AMBULATORY_CARE_PROVIDER_SITE_OTHER): Payer: Medicare Other | Admitting: *Deleted

## 2014-09-14 ENCOUNTER — Encounter: Payer: Self-pay | Admitting: Cardiovascular Disease

## 2014-09-14 ENCOUNTER — Other Ambulatory Visit: Payer: Self-pay | Admitting: Cardiovascular Disease

## 2014-09-14 DIAGNOSIS — I4892 Unspecified atrial flutter: Secondary | ICD-10-CM

## 2014-09-14 NOTE — Telephone Encounter (Signed)
Rx was sent to pharmacy electronically. 

## 2014-09-14 NOTE — Telephone Encounter (Signed)
Pt still have not received his Midorine 5 mg. Please call to [phone].

## 2014-09-14 NOTE — Progress Notes (Signed)
Remote pacemaker transmission.   

## 2014-09-20 ENCOUNTER — Other Ambulatory Visit: Payer: Self-pay | Admitting: Cardiovascular Disease

## 2014-09-20 NOTE — Telephone Encounter (Signed)
Rx was sent to pharmacy electronically. 

## 2014-09-28 ENCOUNTER — Ambulatory Visit: Payer: Medicare Other | Admitting: Cardiovascular Disease

## 2014-09-29 ENCOUNTER — Other Ambulatory Visit: Payer: Self-pay | Admitting: Nurse Practitioner

## 2014-09-29 DIAGNOSIS — D631 Anemia in chronic kidney disease: Secondary | ICD-10-CM

## 2014-09-29 DIAGNOSIS — N189 Chronic kidney disease, unspecified: Principal | ICD-10-CM

## 2014-09-29 DIAGNOSIS — D472 Monoclonal gammopathy: Secondary | ICD-10-CM

## 2014-09-30 ENCOUNTER — Ambulatory Visit: Payer: Medicare Other | Admitting: Hematology & Oncology

## 2014-09-30 ENCOUNTER — Other Ambulatory Visit (HOSPITAL_BASED_OUTPATIENT_CLINIC_OR_DEPARTMENT_OTHER): Payer: Medicare Other | Admitting: Lab

## 2014-09-30 ENCOUNTER — Telehealth: Payer: Self-pay | Admitting: Hematology & Oncology

## 2014-09-30 DIAGNOSIS — D472 Monoclonal gammopathy: Secondary | ICD-10-CM

## 2014-09-30 DIAGNOSIS — N289 Disorder of kidney and ureter, unspecified: Secondary | ICD-10-CM

## 2014-09-30 DIAGNOSIS — D509 Iron deficiency anemia, unspecified: Secondary | ICD-10-CM

## 2014-09-30 DIAGNOSIS — N189 Chronic kidney disease, unspecified: Principal | ICD-10-CM

## 2014-09-30 DIAGNOSIS — D649 Anemia, unspecified: Secondary | ICD-10-CM | POA: Diagnosis not present

## 2014-09-30 DIAGNOSIS — D631 Anemia in chronic kidney disease: Secondary | ICD-10-CM | POA: Diagnosis not present

## 2014-09-30 LAB — CBC WITH DIFFERENTIAL (CANCER CENTER ONLY)
BASO#: 0 10*3/uL (ref 0.0–0.2)
BASO%: 0.8 % (ref 0.0–2.0)
EOS%: 2.5 % (ref 0.0–7.0)
Eosinophils Absolute: 0.1 10*3/uL (ref 0.0–0.5)
HCT: 36.8 % — ABNORMAL LOW (ref 38.7–49.9)
HGB: 12 g/dL — ABNORMAL LOW (ref 13.0–17.1)
LYMPH#: 1.4 10*3/uL (ref 0.9–3.3)
LYMPH%: 28.5 % (ref 14.0–48.0)
MCH: 32.4 pg (ref 28.0–33.4)
MCHC: 32.6 g/dL (ref 32.0–35.9)
MCV: 100 fL — ABNORMAL HIGH (ref 82–98)
MONO#: 0.5 10*3/uL (ref 0.1–0.9)
MONO%: 10.1 % (ref 0.0–13.0)
NEUT%: 58.1 % (ref 40.0–80.0)
NEUTROS ABS: 2.8 10*3/uL (ref 1.5–6.5)
Platelets: 143 10*3/uL — ABNORMAL LOW (ref 145–400)
RBC: 3.7 10*6/uL — ABNORMAL LOW (ref 4.20–5.70)
RDW: 13.1 % (ref 11.1–15.7)
WBC: 4.7 10*3/uL (ref 4.0–10.0)

## 2014-09-30 NOTE — Telephone Encounter (Signed)
RN aware pt left before seeing MD but after lab and r/s for 11-12

## 2014-10-05 ENCOUNTER — Telehealth: Payer: Self-pay | Admitting: *Deleted

## 2014-10-05 LAB — KAPPA/LAMBDA LIGHT CHAINS
KAPPA FREE LGHT CHN: 3.04 mg/dL — AB (ref 0.33–1.94)
KAPPA LAMBDA RATIO: 0.4 (ref 0.26–1.65)
Lambda Free Lght Chn: 7.53 mg/dL — ABNORMAL HIGH (ref 0.57–2.63)

## 2014-10-05 LAB — SPEP & IFE WITH QIG
ALBUMIN ELP: 56.2 % (ref 55.8–66.1)
ALPHA-1-GLOBULIN: 4.2 % (ref 2.9–4.9)
Alpha-2-Globulin: 10.7 % (ref 7.1–11.8)
BETA 2: 5.2 % (ref 3.2–6.5)
Beta Globulin: 5.5 % (ref 4.7–7.2)
Gamma Globulin: 18.2 % (ref 11.1–18.8)
IGM, SERUM: 293 mg/dL — AB (ref 41–251)
IgA: 171 mg/dL (ref 68–379)
IgG (Immunoglobin G), Serum: 1110 mg/dL (ref 650–1600)
M-Spike, %: 0.44 g/dL
TOTAL PROTEIN, SERUM ELECTROPHOR: 6.7 g/dL (ref 6.0–8.3)

## 2014-10-05 LAB — COMPREHENSIVE METABOLIC PANEL
ALT: 32 U/L (ref 0–53)
AST: 26 U/L (ref 0–37)
Albumin: 4 g/dL (ref 3.5–5.2)
Alkaline Phosphatase: 78 U/L (ref 39–117)
BUN: 18 mg/dL (ref 6–23)
CALCIUM: 9.2 mg/dL (ref 8.4–10.5)
CHLORIDE: 106 meq/L (ref 96–112)
CO2: 26 meq/L (ref 19–32)
Creatinine, Ser: 1.26 mg/dL (ref 0.50–1.35)
Glucose, Bld: 93 mg/dL (ref 70–99)
Potassium: 4 mEq/L (ref 3.5–5.3)
Sodium: 140 mEq/L (ref 135–145)
Total Bilirubin: 1.4 mg/dL — ABNORMAL HIGH (ref 0.2–1.2)
Total Protein: 6.7 g/dL (ref 6.0–8.3)

## 2014-10-05 NOTE — Telephone Encounter (Signed)
Message copied by Rico Ala on Wed Oct 05, 2014 11:08 AM ------      Message from: Burney Gauze R      Created: Wed Oct 05, 2014 10:53 AM       Call his daughter and let him know that the lab work on her dad is very stable. [name] ------

## 2014-10-10 LAB — MDC_IDC_ENUM_SESS_TYPE_REMOTE
Battery Impedance: 100 Ohm
Brady Statistic AP VP Percent: 0 %
Brady Statistic AP VS Percent: 12 %
Brady Statistic AS VP Percent: 0 %
Brady Statistic AS VS Percent: 88 %
Date Time Interrogation Session: 20150916122658
Lead Channel Impedance Value: 490 Ohm
Lead Channel Impedance Value: 591 Ohm
Lead Channel Pacing Threshold Amplitude: 0.75 V
Lead Channel Pacing Threshold Pulse Width: 0.4 ms
Lead Channel Pacing Threshold Pulse Width: 0.4 ms
Lead Channel Setting Pacing Amplitude: 2.5 V
Lead Channel Setting Sensing Sensitivity: 4 mV
MDC IDC MSMT BATTERY REMAINING LONGEVITY: 160 mo
MDC IDC MSMT BATTERY VOLTAGE: 2.8 V
MDC IDC MSMT LEADCHNL RA SENSING INTR AMPL: 2.8 mV
MDC IDC MSMT LEADCHNL RV PACING THRESHOLD AMPLITUDE: 0.375 V
MDC IDC MSMT LEADCHNL RV SENSING INTR AMPL: 22.4 mV
MDC IDC SET LEADCHNL RA PACING AMPLITUDE: 2 V
MDC IDC SET LEADCHNL RV PACING PULSEWIDTH: 0.4 ms

## 2014-10-13 ENCOUNTER — Ambulatory Visit (INDEPENDENT_AMBULATORY_CARE_PROVIDER_SITE_OTHER): Payer: Medicare Other | Admitting: Cardiovascular Disease

## 2014-10-13 ENCOUNTER — Ambulatory Visit (INDEPENDENT_AMBULATORY_CARE_PROVIDER_SITE_OTHER): Payer: Medicare Other | Admitting: *Deleted

## 2014-10-13 ENCOUNTER — Encounter: Payer: Self-pay | Admitting: Cardiology

## 2014-10-13 VITALS — BP 132/83 | HR 95 | Ht 71.0 in | Wt 186.2 lb

## 2014-10-13 DIAGNOSIS — Z950 Presence of cardiac pacemaker: Secondary | ICD-10-CM | POA: Diagnosis not present

## 2014-10-13 DIAGNOSIS — I4892 Unspecified atrial flutter: Secondary | ICD-10-CM | POA: Diagnosis not present

## 2014-10-13 DIAGNOSIS — I483 Typical atrial flutter: Secondary | ICD-10-CM | POA: Diagnosis not present

## 2014-10-13 DIAGNOSIS — I2510 Atherosclerotic heart disease of native coronary artery without angina pectoris: Secondary | ICD-10-CM | POA: Diagnosis not present

## 2014-10-13 DIAGNOSIS — I480 Paroxysmal atrial fibrillation: Secondary | ICD-10-CM

## 2014-10-13 DIAGNOSIS — Z951 Presence of aortocoronary bypass graft: Secondary | ICD-10-CM | POA: Diagnosis not present

## 2014-10-13 DIAGNOSIS — R55 Syncope and collapse: Secondary | ICD-10-CM

## 2014-10-13 MED ORDER — METOPROLOL TARTRATE 25 MG PO TABS: 37.5000 mg | ORAL_TABLET | Freq: Two times a day (BID) | ORAL | Status: DC

## 2014-10-13 NOTE — Assessment & Plan Note (Signed)
2 episodes of "blank stare" while sitting, brief episodes.  Dr. Jerilynn Mages. Croitoru instructed daughter to lie pt back on the floor if necessary when this occurs and BP will come back up.  With increase of BB he may have more episodes.  He is on Midodrine and this usually controls his orthostatic hypotension.

## 2014-10-13 NOTE — Patient Instructions (Signed)
Your physician recommends that you schedule a follow-up appointment in: 4 Months with Dr Claiborne Billings  Your physician has recommended you make the following change in your medication: Increase Metoprolol to 1 1/2 tablets twice daily

## 2014-10-13 NOTE — Assessment & Plan Note (Signed)
Stable pacer.

## 2014-10-13 NOTE — Assessment & Plan Note (Signed)
x4, LIMA to LAD, vein to distal circumflex, sequential vein to intermediate and obtuse marginal vessel (2006) No chest pain.
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
## 2014-10-13 NOTE — Assessment & Plan Note (Signed)
Has been in a flutter for 1 month, only complaint is dizziness though am not sure if it is truly associated.  Otherwise pt unaware of irregular HR.  He is on Eliquis, HR mostly rate controlled.  Dr. Jerilynn Mages. Croitoru has also seen and evaluated.  Will adjust lopressor to 37.5 mg BID to prevent rapid HR episodes seen on interrogation. If HR remains controlled then would most likely leave in a flutter.

## 2014-10-14 ENCOUNTER — Encounter: Payer: Self-pay | Admitting: Cardiology

## 2014-11-02 LAB — MDC_IDC_ENUM_SESS_TYPE_INCLINIC
Battery Impedance: 100 Ohm
Battery Remaining Longevity: 13.5
Brady Statistic AP VP Percent: 0.1 % — CL
Brady Statistic AP VS Percent: 10.7 %
Brady Statistic AS VP Percent: 0.2 %
Brady Statistic AS VS Percent: 89.1 %
Lead Channel Impedance Value: 490 Ohm
Lead Channel Sensing Intrinsic Amplitude: 2.8 mV
Lead Channel Setting Pacing Amplitude: 2 V
Lead Channel Setting Pacing Pulse Width: 0.4 ms
MDC IDC MSMT BATTERY VOLTAGE: 2.79 V
MDC IDC MSMT LEADCHNL RV IMPEDANCE VALUE: 578 Ohm
MDC IDC MSMT LEADCHNL RV PACING THRESHOLD AMPLITUDE: 0.375 V
MDC IDC MSMT LEADCHNL RV PACING THRESHOLD PULSEWIDTH: 0.4 ms
MDC IDC MSMT LEADCHNL RV SENSING INTR AMPL: 8 mV
MDC IDC SET LEADCHNL RV PACING AMPLITUDE: 2.5 V
MDC IDC SET LEADCHNL RV SENSING SENSITIVITY: 4 mV

## 2014-11-02 NOTE — Progress Notes (Signed)
Pacemaker check in clinic (Industry checked). Normal device function. Threshold, sensing, impedances consistent with previous measurements. Device programmed to maximize longevity. 1,575 AMS episodes (12.8%)---694 AHR episodes---max dur. 37 hours, Max A 380 + Eliquis. 18 high ventricular rates noted---max dur. 2 mins 12 sec, Max V 233. Device programmed at appropriate safety margins. Histogram distribution appropriate for patient activity level. Device programmed to optimize intrinsic conduction. A.Nips attempted---unsuccessful. No further attempts will be made to obtain SR at this time. Estimated longevity 13.5 years. Patient will follow up via [facility] on 12/21 as scheduled and with [facility] in 02-2015.

## 2014-11-09 ENCOUNTER — Encounter: Payer: Self-pay | Admitting: Cardiovascular Disease

## 2014-11-09 ENCOUNTER — Other Ambulatory Visit: Payer: Self-pay | Admitting: *Deleted

## 2014-11-09 DIAGNOSIS — D472 Monoclonal gammopathy: Secondary | ICD-10-CM

## 2014-11-10 ENCOUNTER — Other Ambulatory Visit: Payer: Self-pay | Admitting: Cardiology

## 2014-11-10 ENCOUNTER — Other Ambulatory Visit (HOSPITAL_BASED_OUTPATIENT_CLINIC_OR_DEPARTMENT_OTHER): Payer: Medicare Other | Admitting: Lab

## 2014-11-10 ENCOUNTER — Ambulatory Visit (HOSPITAL_BASED_OUTPATIENT_CLINIC_OR_DEPARTMENT_OTHER): Payer: Medicare Other | Admitting: Hematology & Oncology

## 2014-11-10 VITALS — BP 131/70 | HR 83 | Temp 98.1°F | Resp 16 | Wt 182.0 lb

## 2014-11-10 DIAGNOSIS — D472 Monoclonal gammopathy: Secondary | ICD-10-CM

## 2014-11-10 DIAGNOSIS — D631 Anemia in chronic kidney disease: Secondary | ICD-10-CM

## 2014-11-10 DIAGNOSIS — N189 Chronic kidney disease, unspecified: Secondary | ICD-10-CM

## 2014-11-10 DIAGNOSIS — D509 Iron deficiency anemia, unspecified: Secondary | ICD-10-CM

## 2014-11-10 LAB — CBC WITH DIFFERENTIAL (CANCER CENTER ONLY)
BASO#: 0.1 10*3/uL (ref 0.0–0.2)
BASO%: 1.3 % (ref 0.0–2.0)
EOS ABS: 0.1 10*3/uL (ref 0.0–0.5)
EOS%: 2.2 % (ref 0.0–7.0)
HEMATOCRIT: 39.2 % (ref 38.7–49.9)
HEMOGLOBIN: 12.8 g/dL — AB (ref 13.0–17.1)
LYMPH#: 1.1 10*3/uL (ref 0.9–3.3)
LYMPH%: 24.9 % (ref 14.0–48.0)
MCH: 32.1 pg (ref 28.0–33.4)
MCHC: 32.7 g/dL (ref 32.0–35.9)
MCV: 98 fL (ref 82–98)
MONO#: 0.4 10*3/uL (ref 0.1–0.9)
MONO%: 9.8 % (ref 0.0–13.0)
NEUT#: 2.8 10*3/uL (ref 1.5–6.5)
NEUT%: 61.8 % (ref 40.0–80.0)
Platelets: 168 10*3/uL (ref 145–400)
RBC: 3.99 10*6/uL — AB (ref 4.20–5.70)
RDW: 12.2 % (ref 11.1–15.7)
WBC: 4.5 10*3/uL (ref 4.0–10.0)

## 2014-11-10 NOTE — Progress Notes (Signed)
Hematology and Oncology Follow Up Visit  [name] [id number] 02-Jun-1929 80 y.o. 11/10/2014   Principle Diagnosis:  1. 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 2. Anemia secondary to renal insufficiency. 3. Intermittent iron-deficiency anemia.  Current Therapy:    Observation     Interim History:  [name] is back for follow-up. He is doing real well. Last saw him back this summer.  He's had no problems at all physically. He did the [employer]. He was awarded Art gallery manager.  He's had no problems with cough. His heart's been doing okay. He's had no bleeding. He's had no change in bowel or bladder habits.  We'll he last saw him,his M spike was 0.44 g/L. His IgG G level was 1140 mg/dL. His Kappa light chain 17.7 Mg/DL.  We have had to give him iron in the past. So far, he's been doing pretty well with this.  His appetite has been good. He's had no nausea or vomiting. He's had no rashes. He's had no weight loss or weight gain.  Medications: Current outpatient prescriptions: atorvastatin (LIPITOR) 40 MG tablet, Take 1 tablet (40 mg total) by mouth every evening., Disp: 30 tablet, Rfl: 11;  budesonide-formoterol (SYMBICORT) 80-4.5 MCG/ACT inhaler, Inhale 2 puffs into the lungs 2 (two) times daily., Disp: 1 Inhaler, Rfl: 5;  diphenhydramine-acetaminophen (TYLENOL PM) 25-500 MG TABS, Take 2 tablets by mouth at bedtime. , Disp: , Rfl:  ELIQUIS 5 MG TABS tablet, TAKE 1 TABLET BY MOUTH EVERY 12 HOURS, Disp: 180 tablet, Rfl: 1;  ENSURE (ENSURE), Take 1 Can by mouth every morning., Disp: , Rfl: ;  guaifenesin (HUMIBID E) 400 MG TABS tablet, Take 400 mg by mouth daily., Disp: , Rfl: ;  metoprolol tartrate (LOPRESSOR) 25 MG tablet, Take 1.5 tablets (37.5 mg total) by mouth 2 (two) times daily., Disp: 90 tablet, Rfl: 11 midodrine (PROAMATINE) 5 MG tablet, TAKE 1 TABLET (5 MG TOTAL) BY MOUTH 3 (THREE) TIMES DAILY., Disp: 270 tablet, Rfl: 2;  Multiple  Vitamins-Minerals (CVS SPECTRAVITE SENIOR) TABS, Take 1 tablet by mouth every morning. , Disp: , Rfl: ;  niacin (NIASPAN) 500 MG CR tablet, TAKE 1 TABLET (500 MG TOTAL) BY MOUTH AT BEDTIME., Disp: 30 tablet, Rfl: 8 Probiotic Product (CVS SENIOR PROBIOTIC PO), Take 1 capsule by mouth at bedtime. Once daily, Disp: , Rfl: ;  Tamsulosin HCl (FLOMAX) 0.4 MG CAPS, Take 0.4 mg by mouth at bedtime. , Disp: , Rfl: ;  tiotropium (SPIRIVA) 18 MCG inhalation capsule, Place 1 capsule (18 mcg total) into inhaler and inhale every morning., Disp: 30 capsule, Rfl: 5  Allergies: No Known Allergies  Past Medical History, Surgical history, Social history, and Family History were reviewed and updated.  Review of Systems: As above  Physical Exam:  weight is 182 lb (82.555 kg). His oral temperature is 98.1 F (36.7 C). His blood pressure is 131/70 and his pulse is 83. His respiration is 16.   Elderly but well-nourished white him in. Head and neck exam shows no ocular or oral lesions. There are no palpable cervical or supraclavicular lymph nodes. Lungs are clear. Cardiac exam regular rate and rhythm with no murmurs, rubs or bruits. Abdomen is soft. Has good bowel sounds. There is no palpable abdominal mass. There is no palpable liver or spleen tip. Back exam shows no tenderness over the spine, ribs or hips. Extremities shows no clubbing, cyanosis or edema. He has some age related osteoarthritic changes.he has good strength. Skin exam shows no  rashes, ecchymoses or petechia. Neurological exam shows no deficits.  Lab Results  Component Value Date   WBC 4.5 11/10/2014   HGB 12.8* 11/10/2014   HCT 39.2 11/10/2014   MCV 98 11/10/2014   PLT 168 11/10/2014     Chemistry      Component Value Date/Time   NA 140 09/30/2014 1125   K 4.0 09/30/2014 1125   CL 106 09/30/2014 1125   CO2 26 09/30/2014 1125   BUN 18 09/30/2014 1125   CREATININE 1.26 09/30/2014 1125   CREATININE 1.30 06/10/2014 1105      Component Value  Date/Time   CALCIUM 9.2 09/30/2014 1125   ALKPHOS 78 09/30/2014 1125   AST 26 09/30/2014 1125   ALT 32 09/30/2014 1125   BILITOT 1.4* 09/30/2014 1125      M spike 0.44 g/L. IgG level is 1110 mg/dL. Kappa light chain is 3.04 mg/dL.   Impression and Plan: [name] is 80 year old gentleman. So far, everything is holding nice and steady. I don't see any issues that we have right now. I would not think that his iron studies are going to be low.  We can probably get him back in 6 more months.  I'm so happy that he is still living a very active lifestyle.   [name], MD 11/12/20152:19 PM

## 2014-11-14 ENCOUNTER — Telehealth: Payer: Self-pay | Admitting: *Deleted

## 2014-11-14 LAB — COMPREHENSIVE METABOLIC PANEL
ALT: 40 U/L (ref 0–53)
AST: 33 U/L (ref 0–37)
Albumin: 4 g/dL (ref 3.5–5.2)
Alkaline Phosphatase: 81 U/L (ref 39–117)
BILIRUBIN TOTAL: 1 mg/dL (ref 0.2–1.2)
BUN: 17 mg/dL (ref 6–23)
CHLORIDE: 104 meq/L (ref 96–112)
CO2: 24 meq/L (ref 19–32)
CREATININE: 1.25 mg/dL (ref 0.50–1.35)
Calcium: 9.5 mg/dL (ref 8.4–10.5)
GLUCOSE: 111 mg/dL — AB (ref 70–99)
Potassium: 4.8 mEq/L (ref 3.5–5.3)
Sodium: 138 mEq/L (ref 135–145)
TOTAL PROTEIN: 6.9 g/dL (ref 6.0–8.3)

## 2014-11-14 LAB — PROTEIN ELECTROPHORESIS, SERUM
Albumin ELP: 54.2 % — ABNORMAL LOW (ref 55.8–66.1)
Alpha-1-Globulin: 4.4 % (ref 2.9–4.9)
Alpha-2-Globulin: 11.7 % (ref 7.1–11.8)
BETA 2: 5.1 % (ref 3.2–6.5)
BETA GLOBULIN: 6.5 % (ref 4.7–7.2)
Gamma Globulin: 18.1 % (ref 11.1–18.8)
M-SPIKE, %: 0.43 g/dL
TOTAL PROTEIN, SERUM ELECTROPHOR: 6.9 g/dL (ref 6.0–8.3)

## 2014-11-14 LAB — KAPPA/LAMBDA LIGHT CHAINS
Kappa free light chain: 3.38 mg/dL — ABNORMAL HIGH (ref 0.33–1.94)
Kappa:Lambda Ratio: 0.4 (ref 0.26–1.65)
Lambda Free Lght Chn: 8.55 mg/dL — ABNORMAL HIGH (ref 0.57–2.63)

## 2014-11-14 LAB — IGG, IGA, IGM
IGG (IMMUNOGLOBIN G), SERUM: 1170 mg/dL (ref 650–1600)
IgA: 185 mg/dL (ref 68–379)
IgM, Serum: 286 mg/dL — ABNORMAL HIGH (ref 41–251)

## 2014-11-14 NOTE — Telephone Encounter (Addendum)
Left message  ----- Message from [name], MD sent at 11/11/2014  5:52 PM EST ----- Call - labs look great!!!  Nothing looks abnormal!! Laurey Arrow

## 2014-12-02 ENCOUNTER — Telehealth: Payer: Self-pay | Admitting: Emergency Medicine

## 2014-12-02 MED ORDER — TIOTROPIUM BROMIDE MONOHYDRATE 18 MCG IN CAPS: 18.0000 ug | ORAL_CAPSULE | RESPIRATORY_TRACT | Status: DC

## 2014-12-02 MED ORDER — BUDESONIDE-FORMOTEROL FUMARATE 80-4.5 MCG/ACT IN AERO: 2.0000 | INHALATION_SPRAY | Freq: Two times a day (BID) | RESPIRATORY_TRACT | Status: DC

## 2014-12-02 NOTE — Telephone Encounter (Signed)
Pt's daughter requesting 90-day rx for spiriva and symbicort- states this is cheaper for pt.  meds sent in.  Nothing further needed.

## 2014-12-08 ENCOUNTER — Encounter (HOSPITAL_COMMUNITY): Payer: Self-pay | Admitting: Cardiovascular Disease

## 2014-12-19 ENCOUNTER — Encounter: Payer: Self-pay | Admitting: Cardiovascular Disease

## 2014-12-19 ENCOUNTER — Telehealth: Payer: Self-pay | Admitting: Cardiology

## 2014-12-19 ENCOUNTER — Ambulatory Visit (INDEPENDENT_AMBULATORY_CARE_PROVIDER_SITE_OTHER): Payer: Medicare Other | Admitting: *Deleted

## 2014-12-19 DIAGNOSIS — I480 Paroxysmal atrial fibrillation: Secondary | ICD-10-CM

## 2014-12-19 NOTE — Telephone Encounter (Signed)
Confirmed remote transmission w/ pt daughter.   

## 2014-12-20 NOTE — Progress Notes (Signed)
Remote pacemaker transmission.   

## 2014-12-21 LAB — MDC_IDC_ENUM_SESS_TYPE_REMOTE
Battery Impedance: 100 Ohm
Brady Statistic AP VS Percent: 16 %
Brady Statistic AS VP Percent: 1 %
Brady Statistic AS VS Percent: 83 %
Date Time Interrogation Session: 20151221225821
Lead Channel Impedance Value: 490 Ohm
Lead Channel Impedance Value: 566 Ohm
Lead Channel Pacing Threshold Amplitude: 0.375 V
Lead Channel Pacing Threshold Amplitude: 1 V
Lead Channel Pacing Threshold Pulse Width: 0.4 ms
Lead Channel Pacing Threshold Pulse Width: 0.4 ms
Lead Channel Sensing Intrinsic Amplitude: 11.2 mV
Lead Channel Sensing Intrinsic Amplitude: 2.8 mV
Lead Channel Setting Pacing Amplitude: 2.5 V
Lead Channel Setting Sensing Sensitivity: 4 mV
MDC IDC MSMT BATTERY REMAINING LONGEVITY: 157 mo
MDC IDC MSMT BATTERY VOLTAGE: 2.8 V
MDC IDC SET LEADCHNL RA PACING AMPLITUDE: 2 V
MDC IDC SET LEADCHNL RV PACING PULSEWIDTH: 0.4 ms
MDC IDC STAT BRADY AP VP PERCENT: 0 %

## 2015-01-02 ENCOUNTER — Encounter: Payer: Self-pay | Admitting: Cardiology

## 2015-02-17 ENCOUNTER — Ambulatory Visit (INDEPENDENT_AMBULATORY_CARE_PROVIDER_SITE_OTHER): Payer: Medicare Other | Admitting: Cardiovascular Disease

## 2015-02-17 VITALS — BP 126/60 | HR 75 | Ht 70.0 in | Wt 183.5 lb

## 2015-02-17 DIAGNOSIS — I2510 Atherosclerotic heart disease of native coronary artery without angina pectoris: Secondary | ICD-10-CM | POA: Diagnosis not present

## 2015-02-17 DIAGNOSIS — I4892 Unspecified atrial flutter: Secondary | ICD-10-CM

## 2015-02-17 DIAGNOSIS — D472 Monoclonal gammopathy: Secondary | ICD-10-CM

## 2015-02-17 DIAGNOSIS — E785 Hyperlipidemia, unspecified: Secondary | ICD-10-CM

## 2015-02-17 DIAGNOSIS — Z950 Presence of cardiac pacemaker: Secondary | ICD-10-CM

## 2015-02-17 DIAGNOSIS — Z951 Presence of aortocoronary bypass graft: Secondary | ICD-10-CM | POA: Diagnosis not present

## 2015-02-17 DIAGNOSIS — Z79899 Other long term (current) drug therapy: Secondary | ICD-10-CM | POA: Diagnosis not present

## 2015-02-17 NOTE — Patient Instructions (Signed)
Your physician recommends that you return for lab work in: 5 months. Lab orders will be mailed to you when labs are due.  Your physician wants you to follow-up in: 6 months or sooner if needed with Dr. Claiborne Billings. You will receive a reminder letter in the mail two months in advance. If you don't receive a letter, please call our office to schedule the follow-up appointment.

## 2015-02-19 ENCOUNTER — Encounter: Payer: Self-pay | Admitting: Cardiovascular Disease
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
## 2015-02-28 ENCOUNTER — Ambulatory Visit (INDEPENDENT_AMBULATORY_CARE_PROVIDER_SITE_OTHER): Payer: Medicare Other | Admitting: Cardiovascular Disease

## 2015-02-28 ENCOUNTER — Encounter: Payer: Self-pay | Admitting: Cardiovascular Disease

## 2015-02-28 VITALS — BP 152/79 | HR 99 | Resp 16 | Ht 70.0 in | Wt 186.5 lb

## 2015-02-28 DIAGNOSIS — I2510 Atherosclerotic heart disease of native coronary artery without angina pectoris: Secondary | ICD-10-CM | POA: Diagnosis not present

## 2015-02-28 DIAGNOSIS — I4892 Unspecified atrial flutter: Secondary | ICD-10-CM

## 2015-02-28 DIAGNOSIS — Z951 Presence of aortocoronary bypass graft: Secondary | ICD-10-CM | POA: Diagnosis not present

## 2015-02-28 DIAGNOSIS — Z950 Presence of cardiac pacemaker: Secondary | ICD-10-CM

## 2015-02-28 DIAGNOSIS — E785 Hyperlipidemia, unspecified: Secondary | ICD-10-CM

## 2015-02-28 DIAGNOSIS — I4430 Unspecified atrioventricular block: Secondary | ICD-10-CM | POA: Diagnosis not present

## 2015-02-28 LAB — MDC_IDC_ENUM_SESS_TYPE_INCLINIC
Battery Voltage: 2.79 V
Brady Statistic AP VP Percent: 0.2 %
Brady Statistic AS VS Percent: 82.9 %
Lead Channel Impedance Value: 483 Ohm
Lead Channel Impedance Value: 552 Ohm
Lead Channel Pacing Threshold Amplitude: 0.75 V
Lead Channel Sensing Intrinsic Amplitude: 2.8 mV
Lead Channel Sensing Intrinsic Amplitude: 8 mV
Lead Channel Setting Pacing Amplitude: 2 V
MDC IDC MSMT BATTERY IMPEDANCE: 100 Ohm
MDC IDC MSMT LEADCHNL RA PACING THRESHOLD PULSEWIDTH: 0.4 ms
MDC IDC MSMT LEADCHNL RV PACING THRESHOLD AMPLITUDE: 0.375 V
MDC IDC MSMT LEADCHNL RV PACING THRESHOLD PULSEWIDTH: 0.4 ms
MDC IDC SET LEADCHNL RV PACING AMPLITUDE: 2.5 V
MDC IDC SET LEADCHNL RV PACING PULSEWIDTH: 0.4 ms
MDC IDC SET LEADCHNL RV SENSING SENSITIVITY: 4 mV
MDC IDC STAT BRADY AP VS PERCENT: 16.3 %
MDC IDC STAT BRADY AS VP PERCENT: 0.7 %

## 2015-02-28 NOTE — Patient Instructions (Signed)
Remote monitoring is used to monitor your pacemaker from home. This monitoring reduces the number of office visits required to check your device to one time per year. It allows [place] to keep an eye on the functioning of your device to ensure it is working properly. You are scheduled for a device check from home on 05-30-2015. You may send your transmission at any time that day. If you have a wireless device, the transmission will be sent automatically. After your physician reviews your transmission, you will receive a postcard with your next transmission date.  Your physician recommends that you schedule a follow-up appointment in: 12 months with Dr.Croitoru

## 2015-03-05 ENCOUNTER — Encounter: Payer: Self-pay | Admitting: Cardiovascular Disease

## 2015-03-05 NOTE — Progress Notes (Signed)
Patient ID: [name], male   DOB: June 08, 1929, 81 y.o.   MRN: [id number] \     Cardiology Office Note   Date:  03/05/2015   ID:  [name], DOB 1929/12/24, MRN [id number]  PCP:  Leonides Sake, MD  Cardiologist:  Shelva Majestic, MD  Sanda Klein, MD   Chief Complaint  Patient presents with  . Annual Exam    no complaints      History of Present Illness: [name] is a 81 y.o. male who presents for pacemaker follow up, implanted in early 2015 for paroxysmal high grade AV block and syncope. Had cardioversion for atrial flutter in the past. Despite midodrine therapy, he has also had episodes of near syncope that are likely related to transient hypotension, but no full episodes of loss of consciousness since pacemaker implantation. He has normal LV function. CABG performed in 2006, normal nuclear study June 2015. No bleeding or embolic events, on Eliquis.He saw Dr. Claiborne Billings last month.  Device check (Medtronic Adapta dual chamber) shows generator longevity estimated at 11-15 years, 16% atrial pacing, 1% ventricular pacing. No atrial arrhythmia in last 3 months.   Past Medical History  Diagnosis Date  . Coronary heart disease     2D Echo, 04/13/2013 - EF 55-60%, moderate regurg of the tricuspid valve  . COPD (chronic obstructive pulmonary disease)   . MGUS (monoclonal gammopathy of unknown significance) 12/05/2011  . Anemia of renal disease 12/05/2011  . Anemia, iron deficiency 12/05/2011  . CAS (cerebral atherosclerosis)     Carotid Dopplers, 04/09/2012 - Bilateral Bulb/Proximal ICAs-mild to moderate amount of fibrous plaque of fibrous soft plaque w/o evidence of a significant diameter reduction, dissection, or any other vascular abnormality  . Nonspecific ST-T wave electrocardiographic changes     Lexiscan, 06/02/2012 - EKG negative for ischemia, compared to previous study-there is no significant change, normal myocardial perfusion study  . Asthma   . Dysrhythmia  11/09/2013    SVT VS A FLUTTER   . Shortness of breath   . Kidney stones   . Status post placement of implantable loop recorder 12/09/2013    Past Surgical History  Procedure Laterality Date  . Cardiac catheterization  12/05/2005    Recommended CABG  . Coronary artery bypass graft  12/06/2005    x4, LIMA to LAD, vein to distal circumflex, sequential vein to intermediate and obtuse marginal vessel  . Kidney stone surgery    . Inner ear surgery    . Cataract extraction    . Tee without cardioversion N/A 11/09/2013    Procedure: TRANSESOPHAGEAL ECHOCARDIOGRAM (TEE);  Surgeon: Pixie Casino, MD;  Location: [facility] ENDOSCOPY;  Service: Cardiovascular;  Laterality: N/A;  . Cardioversion N/A 11/09/2013    Procedure: CARDIOVERSION;  Surgeon: Pixie Casino, MD;  Location: [place];  Service: Cardiovascular;  Laterality: N/A;  . Loop recorder implant N/A 11/30/2013    Procedure: LOOP RECORDER IMPLANT;  Surgeon: Sanda Klein, MD;  Location: [facility] CATH LAB;  Service: Cardiovascular;  Laterality: N/A;  . Permanent pacemaker insertion N/A 02/04/2014    Procedure: PERMANENT PACEMAKER INSERTION;  Surgeon: Sanda Klein, MD;  Location: [place] CATH LAB;  Service: Cardiovascular;  Laterality: N/A;     Current Outpatient Prescriptions  Medication Sig Dispense Refill  . atorvastatin (LIPITOR) 40 MG tablet Take 1 tablet (40 mg total) by mouth every evening. 30 tablet 11  . budesonide-formoterol (SYMBICORT) 80-4.5 MCG/ACT inhaler Inhale 2 puffs into the lungs 2 (two) times daily. 3 Inhaler 2  .  diphenhydramine-acetaminophen (TYLENOL PM) 25-500 MG TABS Take 2 tablets by mouth at bedtime.     Marland Kitchen ELIQUIS 5 MG TABS tablet TAKE 1 TABLET BY MOUTH EVERY 12 HOURS 180 tablet 1  . ENSURE (ENSURE) Take 1 Can by mouth every morning.    Marland Kitchen guaifenesin (HUMIBID E) 400 MG TABS tablet Take 400 mg by mouth daily.    . metoprolol tartrate (LOPRESSOR) 25 MG tablet Take 1.5 tablets (37.5 mg total) by mouth 2 (two) times daily. 90  tablet 11  . midodrine (PROAMATINE) 5 MG tablet TAKE 1 TABLET (5 MG TOTAL) BY MOUTH 3 (THREE) TIMES DAILY. 270 tablet 2  . Multiple Vitamins-Minerals (CVS SPECTRAVITE SENIOR) TABS Take 1 tablet by mouth every morning.     . niacin (NIASPAN) 500 MG CR tablet TAKE 1 TABLET (500 MG TOTAL) BY MOUTH AT BEDTIME. 30 tablet 8  . Probiotic Product ([employer]S SENIOR PROBIOTIC PO) Take 1 capsule by mouth at bedtime. Once daily    . Tamsulosin HCl (FLOMAX) 0.4 MG CAPS Take 0.4 mg by mouth at bedtime.     Marland Kitchen tiotropium (SPIRIVA) 18 MCG inhalation capsule Place 1 capsule (18 mcg total) into inhaler and inhale every morning. 90 capsule 2   No current facility-administered medications for this visit.    Allergies:   Review of patient's allergies indicates no known allergies.    Social History:  The patient  reports that he quit smoking about 21 years ago. His smoking use included Cigarettes. He started smoking about 63 years ago. He has a 47 pack-year smoking history. He has never used smokeless tobacco. He reports that he does not drink alcohol or use illicit drugs.   Family History:  The patient's family history includes Emphysema in his daughter; Heart attack in his father and mother; Heart disease in his brother and mother; Tuberculosis in his father.    ROS:  Please see the history of present illness.  Infrequent "blank stares"/near-syncope The patient specifically denies any chest pain at rest or with exertion, dyspnea at rest or with exertion, orthopnea, paroxysmal nocturnal dyspnea, syncope, palpitations, focal neurological deficits, intermittent claudication, lower extremity edema, unexplained weight gain, cough, hemoptysis or wheezing.  The patient also denies abdominal pain, nausea, vomiting, dysphagia, diarrhea, constipation, polyuria, polydipsia, dysuria, hematuria, frequency, urgency, abnormal bleeding or bruising, fever, chills, unexpected weight changes, mood swings, change in skin or hair texture,  change in voice quality, auditory or visual problems, allergic reactions or rashes, new musculoskeletal complaints other than usual "aches and pains".  Otherwise, review of systems are positive for none.   All other systems are reviewed and negative.    PHYSICAL EXAM: VS:  BP 152/79 mmHg  Pulse 99  Resp 16  Ht 5\' 10"  (1.778 m)  Wt 186 lb 8 oz (84.596 kg)  BMI 26.76 kg/m2 , BMI Body mass index is 26.76 kg/(m^2). General: Alert, oriented x3, no distress  Head: no evidence of trauma, PERRL, EOMI, no exophtalmos or lid lag, no myxedema, no xanthelasma; normal ears, nose and oropharynx  Neck: normal jugular venous pulsations and no hepatojugular reflux; brisk carotid pulses without delay and no carotid bruits  Chest: clear to auscultation, no signs of consolidation by percussion or palpation, normal fremitus, symmetrical and full respiratory excursions , healthy subclavian pacemaker scar Cardiovascular: normal position and quality of the apical impulse, regular rhythm, normal first and second heart sounds, no rubs or gallops, 1/6 systolic murmur, likely aortic ejection  Abdomen: no tenderness or distention, no masses by palpation,  no abnormal pulsatility or arterial bruits, normal bowel sounds, no hepatosplenomegaly  Extremities: no clubbing, cyanosis or edema; 2+ radial, ulnar and brachial pulses bilaterally; 2+ right femoral, posterior tibial and dorsalis pedis pulses; 2+ left femoral, posterior tibial and dorsalis pedis pulses; no subclavian or femoral bruits  Neurological: grossly nonfocal   EKG:  EKG is not ordered today. The ekg ordered 2/19 demonstrates NSR   Recent Labs: 06/10/2014: TSH 4.123 11/10/2014: ALT 40; BUN 17; Creatinine 1.25; Hemoglobin 12.8*; Platelets 168; Potassium 4.8; Sodium 138    Lipid Panel    Component Value Date/Time   CHOL 132 06/10/2014 1105   TRIG 219* 06/10/2014 1105   HDL 34* 06/10/2014 1105   CHOLHDL 3.9 06/10/2014 1105   VLDL 44* 06/10/2014  1105   LDLCALC 54 06/10/2014 1105      Wt Readings from Last 3 Encounters:  02/28/15 186 lb 8 oz (84.596 kg)  02/17/15 183 lb 8 oz (83.235 kg)  11/10/14 182 lb (82.555 kg)    ASSESSMENT AND PLAN:  Pacemaker- MDT 02/04/14 Normal device function.  Every 3 month carelink remote download. Office visit yearly.  Atrial flutter No recent events. He is on anticoagulant therapy.  S/P CABG x 4 No current symptoms of coronary disease. He'll followup with Dr. Claiborne Billings.   Current medicines are reviewed at length with the patient today.  The patient does not have concerns regarding medicines.  The following changes have been made:  no change  Labs/ tests ordered today include:  Orders Placed This Encounter  Procedures  . Implantable device check   Patient Instructions  Remote monitoring is used to monitor your pacemaker from home. This monitoring reduces the number of office visits required to check your device to one time per year. It allows [place] to keep an eye on the functioning of your device to ensure it is working properly. You are scheduled for a device check from home on 05-30-2015. You may send your transmission at any time that day. If you have a wireless device, the transmission will be sent automatically. After your physician reviews your transmission, you will receive a postcard with your next transmission date.  Your physician recommends that you schedule a follow-up appointment in: 12 months with Dr.Derinda Bartus   Mikael Spray, MD  03/05/2015 7:46 PM    [facility] [place], [place], [place]  [address] Phone: [phone]; Fax: [phone]

## 2015-03-16 DIAGNOSIS — N200 Calculus of kidney: Secondary | ICD-10-CM | POA: Diagnosis not present

## 2015-03-16 DIAGNOSIS — N400 Enlarged prostate without lower urinary tract symptoms: Secondary | ICD-10-CM | POA: Diagnosis not present

## 2015-03-20 ENCOUNTER — Encounter: Payer: Self-pay | Admitting: Cardiovascular Disease

## 2015-05-11 ENCOUNTER — Ambulatory Visit (HOSPITAL_BASED_OUTPATIENT_CLINIC_OR_DEPARTMENT_OTHER): Payer: Medicare Other | Admitting: Hematology & Oncology

## 2015-05-11 ENCOUNTER — Encounter: Payer: Self-pay | Admitting: Hematology & Oncology

## 2015-05-11 ENCOUNTER — Ambulatory Visit (HOSPITAL_BASED_OUTPATIENT_CLINIC_OR_DEPARTMENT_OTHER): Payer: Medicare Other

## 2015-05-11 VITALS — BP 148/64 | HR 85 | Temp 97.7°F | Resp 20 | Ht 70.0 in | Wt 187.0 lb

## 2015-05-11 DIAGNOSIS — D509 Iron deficiency anemia, unspecified: Secondary | ICD-10-CM

## 2015-05-11 DIAGNOSIS — D649 Anemia, unspecified: Secondary | ICD-10-CM

## 2015-05-11 DIAGNOSIS — D631 Anemia in chronic kidney disease: Secondary | ICD-10-CM | POA: Diagnosis not present

## 2015-05-11 DIAGNOSIS — D472 Monoclonal gammopathy: Secondary | ICD-10-CM

## 2015-05-11 DIAGNOSIS — N289 Disorder of kidney and ureter, unspecified: Secondary | ICD-10-CM

## 2015-05-11 DIAGNOSIS — N189 Chronic kidney disease, unspecified: Secondary | ICD-10-CM

## 2015-05-11 LAB — CBC WITH DIFFERENTIAL (CANCER CENTER ONLY)
BASO#: 0 10*3/uL (ref 0.0–0.2)
BASO%: 0.8 % (ref 0.0–2.0)
EOS ABS: 0.1 10*3/uL (ref 0.0–0.5)
EOS%: 2 % (ref 0.0–7.0)
HEMATOCRIT: 34.4 % — AB (ref 38.7–49.9)
HGB: 11.4 g/dL — ABNORMAL LOW (ref 13.0–17.1)
LYMPH#: 1.2 10*3/uL (ref 0.9–3.3)
LYMPH%: 31.3 % (ref 14.0–48.0)
MCH: 32.4 pg (ref 28.0–33.4)
MCHC: 33.1 g/dL (ref 32.0–35.9)
MCV: 98 fL (ref 82–98)
MONO#: 0.5 10*3/uL (ref 0.1–0.9)
MONO%: 11.7 % (ref 0.0–13.0)
NEUT#: 2.1 10*3/uL (ref 1.5–6.5)
NEUT%: 54.2 % (ref 40.0–80.0)
Platelets: 160 10*3/uL (ref 145–400)
RBC: 3.52 10*6/uL — AB (ref 4.20–5.70)
RDW: 13.2 % (ref 11.1–15.7)
WBC: 3.9 10*3/uL — AB (ref 4.0–10.0)

## 2015-05-11 LAB — COMPREHENSIVE METABOLIC PANEL
ALK PHOS: 73 U/L (ref 39–117)
ALT: 23 U/L (ref 0–53)
AST: 22 U/L (ref 0–37)
Albumin: 3.6 g/dL (ref 3.5–5.2)
BILIRUBIN TOTAL: 1.1 mg/dL (ref 0.2–1.2)
BUN: 20 mg/dL (ref 6–23)
CO2: 20 mEq/L (ref 19–32)
Calcium: 9.1 mg/dL (ref 8.4–10.5)
Chloride: 108 mEq/L (ref 96–112)
Creatinine, Ser: 1.34 mg/dL (ref 0.50–1.35)
GLUCOSE: 119 mg/dL — AB (ref 70–99)
Potassium: 4.5 mEq/L (ref 3.5–5.3)
Sodium: 140 mEq/L (ref 135–145)
Total Protein: 6.6 g/dL (ref 6.0–8.3)

## 2015-05-11 NOTE — Progress Notes (Signed)
Hematology and Oncology Follow Up Visit  [name] [id number] 04-19-1929 81 y.o. 05/11/2015   Principle Diagnosis:  1. 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 2. Anemia secondary to renal insufficiency. 3. Intermittent iron-deficiency anemia.  Current Therapy:    Observation     Interim History:  [name] is back for follow-up. He is doing real well. He sustained an injury on Lance right thumb. This is from Lance woodworking.  He is doing the [employer]. He is very proficient in sports. He has won 16 gold medals so far.  [name] comes with him today. She says that he is almost more fatigue. When we last saw him in November, Lance monoclonal spike was 0.43 g/dL. Lance IgG level was 117 0 mg/dL. Lance lambda light chain was 8.55 mg/dL.   We'll not check Lance iron studies in a while. I'm checking them today. If he's not had any bleeding.  He has arthritis in the left knee. Lance appetite has been good. There's been no change in bowel or bladder habits. He's had no nausea or vomiting. He's had no issues with leg swelling. He's had no rashes.  Overall, Lance performance status is ECOG 1.  Medications:  Current outpatient prescriptions:  .  atorvastatin (LIPITOR) 40 MG tablet, Take 1 tablet (40 mg total) by mouth every evening., Disp: 30 tablet, Rfl: 11 .  budesonide-formoterol (SYMBICORT) 80-4.5 MCG/ACT inhaler, Inhale 2 puffs into the lungs 2 (two) times daily., Disp: 3 Inhaler, Rfl: 2 .  diphenhydramine-acetaminophen (TYLENOL PM) 25-500 MG TABS, Take 2 tablets by mouth at bedtime. , Disp: , Rfl:  .  ELIQUIS 5 MG TABS tablet, TAKE 1 TABLET BY MOUTH EVERY 12 HOURS, Disp: 180 tablet, Rfl: 1 .  ENSURE (ENSURE), Take 1 Can by mouth every morning., Disp: , Rfl:  .  guaifenesin (HUMIBID E) 400 MG TABS tablet, Take 400 mg by mouth daily., Disp: , Rfl:  .  metoprolol tartrate (LOPRESSOR) 25 MG tablet, Take 1.5 tablets (37.5 mg total) by mouth 2 (two) times  daily., Disp: 90 tablet, Rfl: 11 .  midodrine (PROAMATINE) 5 MG tablet, TAKE 1 TABLET (5 MG TOTAL) BY MOUTH 3 (THREE) TIMES DAILY., Disp: 270 tablet, Rfl: 2 .  Multiple Vitamins-Minerals (CVS SPECTRAVITE SENIOR) TABS, Take 1 tablet by mouth every morning. , Disp: , Rfl:  .  niacin (NIASPAN) 500 MG CR tablet, TAKE 1 TABLET (500 MG TOTAL) BY MOUTH AT BEDTIME., Disp: 30 tablet, Rfl: 8 .  Probiotic Product (CVS SENIOR PROBIOTIC PO), Take 1 capsule by mouth at bedtime. Once daily, Disp: , Rfl:  .  Tamsulosin HCl (FLOMAX) 0.4 MG CAPS, Take 0.4 mg by mouth at bedtime. , Disp: , Rfl:  .  tiotropium (SPIRIVA) 18 MCG inhalation capsule, Place 1 capsule (18 mcg total) into inhaler and inhale every morning., Disp: 90 capsule, Rfl: 2  Allergies: No Known Allergies  Past Medical History, Surgical history, Social history, and Family History were reviewed and updated.  Review of Systems: As above  Physical Exam:  height is 5\' 10"  (1.778 m) and weight is 187 lb (84.823 kg). Lance oral temperature is 97.7 F (36.5 C). Lance blood pressure is 148/64 and Lance pulse is 85. Lance respiration is 20.   Elderly but well-nourished white him in. Head and neck exam shows no ocular or oral lesions. There are no palpable cervical or supraclavicular lymph nodes. Lungs are clear. Cardiac exam regular rate and rhythm with no murmurs, rubs or bruits. Abdomen  is soft. Has good bowel sounds. There is no palpable abdominal mass. There is no palpable liver or spleen tip. Back exam shows no tenderness over the spine, ribs or hips. Extremities shows no clubbing, cyanosis or edema. He has some age related osteoarthritic changes.he has good strength. Skin exam shows no rashes, ecchymoses or petechia. Neurological exam shows no deficits.  Lab Results  Component Value Date   WBC 3.9* 05/11/2015   HGB 11.4* 05/11/2015   HCT 34.4* 05/11/2015   MCV 98 05/11/2015   PLT 160 05/11/2015     Chemistry      Component Value Date/Time   NA 138  11/10/2014 1315   K 4.8 11/10/2014 1315   CL 104 11/10/2014 1315   CO2 24 11/10/2014 1315   BUN 17 11/10/2014 1315   CREATININE 1.25 11/10/2014 1315   CREATININE 1.30 06/10/2014 1105      Component Value Date/Time   CALCIUM 9.5 11/10/2014 1315   ALKPHOS 81 11/10/2014 1315   AST 33 11/10/2014 1315   ALT 40 11/10/2014 1315   BILITOT 1.0 11/10/2014 1315        Impression and Plan: [name] is 81 year old gentleman. So far, everything is holding nice and steady. I don't see any issues that we have right now With the monoclonal spike.  It is possible  that Lance iron studies are going to be low.We will see what they are If they are low, we will go ahead and give him an iron transfusion.   otherwise, we  can probably get him back in 6 more months.  I'm so happy that he is still living a very active lifestyle.   [name], MD 5/12/20161:11 PM

## 2015-05-12 ENCOUNTER — Other Ambulatory Visit: Payer: Self-pay | Admitting: Cardiology

## 2015-05-12 LAB — IRON AND TIBC CHCC
%SAT: 20 % (ref 20–55)
Iron: 51 ug/dL (ref 42–163)
TIBC: 254 ug/dL (ref 202–409)
UIBC: 203 ug/dL (ref 117–376)

## 2015-05-12 LAB — FERRITIN CHCC: Ferritin: 231 ng/ml (ref 22–316)

## 2015-05-15 ENCOUNTER — Other Ambulatory Visit: Payer: Self-pay | Admitting: Cardiovascular Disease

## 2015-05-16 ENCOUNTER — Other Ambulatory Visit: Payer: Self-pay | Admitting: *Deleted

## 2015-05-16 ENCOUNTER — Telehealth: Payer: Self-pay | Admitting: *Deleted

## 2015-05-16 DIAGNOSIS — D472 Monoclonal gammopathy: Secondary | ICD-10-CM

## 2015-05-16 LAB — PROTEIN ELECTROPHORESIS, SERUM, WITH REFLEX
ABNORMAL PROTEIN BAND2: 0.4 g/dL
Abnormal Protein Band1: 0.2 g/dL
Abnormal Protein Band3: NOT DETECTED g/dL
Albumin ELP: 3.8 g/dL (ref 3.8–4.8)
Alpha-1-Globulin: 0.3 g/dL (ref 0.2–0.3)
Alpha-2-Globulin: 0.7 g/dL (ref 0.5–0.9)
Beta 2: 0.4 g/dL (ref 0.2–0.5)
Beta Globulin: 0.4 g/dL (ref 0.4–0.6)
GAMMA GLOBULIN: 1.3 g/dL (ref 0.8–1.7)
Total Protein, Serum Electrophoresis: 6.9 g/dL (ref 6.1–8.1)

## 2015-05-16 LAB — IGG, IGA, IGM
IgA: 169 mg/dL (ref 68–379)
IgG (Immunoglobin G), Serum: 1320 mg/dL (ref 650–1600)
IgM, Serum: 283 mg/dL — ABNORMAL HIGH (ref 41–251)

## 2015-05-16 LAB — KAPPA/LAMBDA LIGHT CHAINS
Kappa free light chain: 3.68 mg/dL — ABNORMAL HIGH (ref 0.33–1.94)
Kappa:Lambda Ratio: 0.44 (ref 0.26–1.65)
LAMBDA FREE LGHT CHN: 8.37 mg/dL — AB (ref 0.57–2.63)

## 2015-05-16 LAB — IFE INTERPRETATION

## 2015-05-16 NOTE — Telephone Encounter (Addendum)
Patient's daughter aware of results. Will call back to set up appointment.   ----- Message from [name], MD sent at 05/16/2015 12:59 PM EDT ----- Call his daughter and tell her that the protein levels looks stable. His iron levels are on the low side. I think he would benefit from one dose of IV iron. This will get him through the summer time. Please set this up. Thanks

## 2015-05-26 ENCOUNTER — Ambulatory Visit (HOSPITAL_BASED_OUTPATIENT_CLINIC_OR_DEPARTMENT_OTHER): Payer: Medicare Other

## 2015-05-26 VITALS — BP 125/62 | HR 61 | Temp 98.0°F | Resp 16 | Ht 70.0 in | Wt 188.0 lb

## 2015-05-26 DIAGNOSIS — D472 Monoclonal gammopathy: Secondary | ICD-10-CM | POA: Diagnosis present

## 2015-05-26 MED ORDER — SODIUM CHLORIDE 0.9 % IV SOLN
510.0000 mg | Freq: Once | INTRAVENOUS | Status: AC
  Administered 2015-05-26: 510 mg via INTRAVENOUS
  Filled 2015-05-26: qty 17

## 2015-05-26 MED ORDER — SODIUM CHLORIDE 0.9 % IV SOLN
INTRAVENOUS | Status: DC
  Administered 2015-05-26: 14:00:00 via INTRAVENOUS

## 2015-05-26 NOTE — Patient Instructions (Signed)

## 2015-05-30 ENCOUNTER — Ambulatory Visit (INDEPENDENT_AMBULATORY_CARE_PROVIDER_SITE_OTHER): Payer: Medicare Other | Admitting: *Deleted

## 2015-05-30 DIAGNOSIS — I4430 Unspecified atrioventricular block: Secondary | ICD-10-CM

## 2015-05-30 DIAGNOSIS — Z950 Presence of cardiac pacemaker: Secondary | ICD-10-CM

## 2015-05-30 NOTE — Progress Notes (Signed)
Remote pacemaker transmission.   

## 2015-05-31 ENCOUNTER — Other Ambulatory Visit: Payer: Self-pay | Admitting: Cardiovascular Disease

## 2015-05-31 NOTE — Telephone Encounter (Signed)
Rx(s) sent to pharmacy electronically.  

## 2015-06-04 LAB — CUP PACEART REMOTE DEVICE CHECK
Battery Impedance: 100 Ohm
Battery Remaining Longevity: 155 mo
Battery Voltage: 2.79 V
Brady Statistic AP VP Percent: 2 %
Brady Statistic AS VS Percent: 70 %
Date Time Interrogation Session: 20160531133236
Lead Channel Impedance Value: 478 Ohm
Lead Channel Impedance Value: 529 Ohm
Lead Channel Pacing Threshold Amplitude: 0.375 V
Lead Channel Pacing Threshold Amplitude: 0.75 V
Lead Channel Pacing Threshold Pulse Width: 0.4 ms
Lead Channel Sensing Intrinsic Amplitude: 8 mV
Lead Channel Setting Pacing Amplitude: 2 V
Lead Channel Setting Pacing Amplitude: 2.5 V
Lead Channel Setting Sensing Sensitivity: 4 mV
MDC IDC MSMT LEADCHNL RA SENSING INTR AMPL: 2.8 mV — AB
MDC IDC MSMT LEADCHNL RV PACING THRESHOLD PULSEWIDTH: 0.4 ms
MDC IDC SET LEADCHNL RV PACING PULSEWIDTH: 0.4 ms
MDC IDC STAT BRADY AP VS PERCENT: 24 %
MDC IDC STAT BRADY AS VP PERCENT: 5 %

## 2015-06-05 ENCOUNTER — Other Ambulatory Visit: Payer: Self-pay | Admitting: Cardiovascular Disease

## 2015-06-09 ENCOUNTER — Encounter: Payer: Self-pay | Admitting: Cardiology

## 2015-06-13 ENCOUNTER — Encounter: Payer: Self-pay | Admitting: Cardiovascular Disease

## 2015-06-13 NOTE — Telephone Encounter (Signed)
Of to renew

## 2015-06-14 NOTE — Telephone Encounter (Signed)
Dr. Claiborne Billings please review and recommend

## 2015-06-20 ENCOUNTER — Other Ambulatory Visit: Payer: Self-pay | Admitting: Cardiovascular Disease

## 2015-06-21 NOTE — Telephone Encounter (Signed)
Rx(s) sent to pharmacy electronically.  

## 2015-06-26 ENCOUNTER — Other Ambulatory Visit: Payer: Self-pay | Admitting: *Deleted

## 2015-06-26 ENCOUNTER — Encounter: Payer: Self-pay | Admitting: *Deleted

## 2015-06-26 DIAGNOSIS — I4892 Unspecified atrial flutter: Secondary | ICD-10-CM

## 2015-06-26 DIAGNOSIS — I2510 Atherosclerotic heart disease of native coronary artery without angina pectoris: Secondary | ICD-10-CM

## 2015-06-26 DIAGNOSIS — Z79899 Other long term (current) drug therapy: Secondary | ICD-10-CM

## 2015-06-26 DIAGNOSIS — E785 Hyperlipidemia, unspecified: Secondary | ICD-10-CM

## 2015-08-14 ENCOUNTER — Telehealth: Payer: Self-pay | Admitting: Cardiovascular Disease

## 2015-08-14 NOTE — Telephone Encounter (Signed)
Reviewed patients previous labs and recent medication additions.  Looks like there could be a possible lipid level so left VM to daughter to keep him fasting for labs he is having done in the morning.

## 2015-08-14 NOTE — Telephone Encounter (Signed)
Pt's daughter called in stating that the pt has to have labs done tomorrow morning and she wanted to know that if he has to fast, does that mean that he does not take his medications? Please call  Thanks

## 2015-08-15 DIAGNOSIS — E785 Hyperlipidemia, unspecified: Secondary | ICD-10-CM | POA: Diagnosis not present

## 2015-08-15 DIAGNOSIS — I4892 Unspecified atrial flutter: Secondary | ICD-10-CM | POA: Diagnosis not present

## 2015-08-15 DIAGNOSIS — Z79899 Other long term (current) drug therapy: Secondary | ICD-10-CM | POA: Diagnosis not present

## 2015-08-15 DIAGNOSIS — I2510 Atherosclerotic heart disease of native coronary artery without angina pectoris: Secondary | ICD-10-CM | POA: Diagnosis not present

## 2015-08-16 LAB — CBC
HCT: 33.9 % — ABNORMAL LOW (ref 39.0–52.0)
HEMOGLOBIN: 11 g/dL — AB (ref 13.0–17.0)
MCH: 31.3 pg (ref 26.0–34.0)
MCHC: 32.4 g/dL (ref 30.0–36.0)
MCV: 96.6 fL (ref 78.0–100.0)
MPV: 10.5 fL (ref 8.6–12.4)
PLATELETS: 142 10*3/uL — AB (ref 150–400)
RBC: 3.51 MIL/uL — AB (ref 4.22–5.81)
RDW: 14.4 % (ref 11.5–15.5)
WBC: 3.7 10*3/uL — AB (ref 4.0–10.5)

## 2015-08-16 LAB — COMPREHENSIVE METABOLIC PANEL
ALK PHOS: 73 U/L (ref 40–115)
ALT: 24 U/L (ref 9–46)
AST: 23 U/L (ref 10–35)
Albumin: 3.5 g/dL — ABNORMAL LOW (ref 3.6–5.1)
BUN: 20 mg/dL (ref 7–25)
CALCIUM: 8.9 mg/dL (ref 8.6–10.3)
CO2: 24 mmol/L (ref 20–31)
Chloride: 107 mmol/L (ref 98–110)
Creat: 1.25 mg/dL — ABNORMAL HIGH (ref 0.70–1.11)
GLUCOSE: 96 mg/dL (ref 65–99)
POTASSIUM: 4.9 mmol/L (ref 3.5–5.3)
Sodium: 141 mmol/L (ref 135–146)
TOTAL PROTEIN: 6.7 g/dL (ref 6.1–8.1)
Total Bilirubin: 1.5 mg/dL — ABNORMAL HIGH (ref 0.2–1.2)

## 2015-08-16 LAB — LIPID PANEL
CHOLESTEROL: 99 mg/dL — AB (ref 125–200)
HDL: 30 mg/dL — ABNORMAL LOW (ref >=40)
LDL Cholesterol: 40 mg/dL (ref <130)
TRIGLYCERIDES: 143 mg/dL (ref <150)
Total CHOL/HDL Ratio: 3.3 Ratio (ref <=5.0)
VLDL: 29 mg/dL (ref <30)

## 2015-08-16 LAB — TSH: TSH: 3.117 u[IU]/mL (ref 0.350–4.500)

## 2015-08-17 ENCOUNTER — Encounter: Payer: Self-pay | Admitting: Cardiovascular Disease

## 2015-08-17 ENCOUNTER — Ambulatory Visit (INDEPENDENT_AMBULATORY_CARE_PROVIDER_SITE_OTHER): Payer: Medicare Other | Admitting: Cardiovascular Disease

## 2015-08-17 VITALS — BP 122/62 | HR 85 | Ht 69.0 in | Wt 184.6 lb

## 2015-08-17 DIAGNOSIS — I2510 Atherosclerotic heart disease of native coronary artery without angina pectoris: Secondary | ICD-10-CM | POA: Diagnosis not present

## 2015-08-17 DIAGNOSIS — I25810 Atherosclerosis of coronary artery bypass graft(s) without angina pectoris: Secondary | ICD-10-CM

## 2015-08-17 DIAGNOSIS — Z951 Presence of aortocoronary bypass graft: Secondary | ICD-10-CM

## 2015-08-17 DIAGNOSIS — E785 Hyperlipidemia, unspecified: Secondary | ICD-10-CM | POA: Diagnosis not present

## 2015-08-17 DIAGNOSIS — Z950 Presence of cardiac pacemaker: Secondary | ICD-10-CM | POA: Diagnosis not present

## 2015-08-17 MED ORDER — METOPROLOL TARTRATE 25 MG PO TABS: 37.5000 mg | ORAL_TABLET | Freq: Two times a day (BID) | ORAL | Status: DC

## 2015-08-17 NOTE — Patient Instructions (Signed)
Your physician wants you to follow-up in: 6 months or sooner if needed. You will receive a reminder letter in the mail two months in advance. If you don't receive a letter, please call our office to schedule the follow-up appointment. 
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
## 2015-08-21 ENCOUNTER — Encounter: Payer: Self-pay | Admitting: *Deleted

## 2015-08-22 NOTE — Telephone Encounter (Signed)
Can this encounter be closed?

## 2015-08-29 ENCOUNTER — Telehealth: Payer: Self-pay | Admitting: Cardiology

## 2015-08-29 ENCOUNTER — Ambulatory Visit (INDEPENDENT_AMBULATORY_CARE_PROVIDER_SITE_OTHER): Payer: Medicare Other | Admitting: *Deleted

## 2015-08-29 ENCOUNTER — Encounter: Payer: Self-pay | Admitting: Cardiovascular Disease

## 2015-08-29 DIAGNOSIS — I4430 Unspecified atrioventricular block: Secondary | ICD-10-CM

## 2015-08-29 NOTE — Telephone Encounter (Signed)
LMOVM reminding pt to send remote transmission.   

## 2015-08-29 NOTE — Progress Notes (Signed)
Remote pacemaker transmission.   

## 2015-09-01 ENCOUNTER — Other Ambulatory Visit: Payer: Self-pay | Admitting: Emergency Medicine

## 2015-09-01 NOTE — Telephone Encounter (Signed)
Needs refill on Spiriva done

## 2015-09-10 LAB — CUP PACEART REMOTE DEVICE CHECK
Battery Remaining Longevity: 150 mo
Battery Voltage: 2.79 V
Date Time Interrogation Session: 20160830174609
Lead Channel Impedance Value: 463 Ohm
Lead Channel Pacing Threshold Pulse Width: 0.4 ms
Lead Channel Pacing Threshold Pulse Width: 0.4 ms
Lead Channel Sensing Intrinsic Amplitude: 2.8 mV
Lead Channel Setting Pacing Amplitude: 2.5 V
Lead Channel Setting Sensing Sensitivity: 4 mV
MDC IDC MSMT BATTERY IMPEDANCE: 111 Ohm
MDC IDC MSMT LEADCHNL RA PACING THRESHOLD AMPLITUDE: 0.75 V
MDC IDC MSMT LEADCHNL RV IMPEDANCE VALUE: 517 Ohm
MDC IDC MSMT LEADCHNL RV PACING THRESHOLD AMPLITUDE: 0.375 V
MDC IDC MSMT LEADCHNL RV SENSING INTR AMPL: 11.2 mV
MDC IDC SET LEADCHNL RA PACING AMPLITUDE: 2 V
MDC IDC SET LEADCHNL RV PACING PULSEWIDTH: 0.4 ms
MDC IDC STAT BRADY AP VP PERCENT: 2 %
MDC IDC STAT BRADY AP VS PERCENT: 29 %
MDC IDC STAT BRADY AS VP PERCENT: 4 %
MDC IDC STAT BRADY AS VS PERCENT: 64 %

## 2015-09-28 NOTE — Progress Notes (Signed)
CC:   Lance Eves, MD  DIAGNOSES: 1. 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 2. Anemia secondary to renal insufficiency. 3. Intermittent iron-deficiency anemia.  CURRENT THERAPY:  Observation.  INTERIM HISTORY:  [name] comes in for followup.  We saw him back in April.  Since then, he has been doing well.  He had a good summer. He is still doing his woodworking.  He thoroughly enjoys this.  Of note, the last time he got iron was back in April.  At that point in time, his iron saturation was 20% with a ferritin of 135.  He has had no problems with bleeding or bruising.  He continues on his medications.  He is on quite a few medications.  When we last checked his monoclonal studies, his monoclonal spike was stable at 0.47 g/dL.  His IgG level was 1070 mg/dL.  Lambda light chain was 5.29 mg/dL.  He has had no fever.  He has had no cough.  He has had no change in bowel or bladder habits.  He recently saw Dr. Lamonte Sakai of Pulmonary Medicine.  Dr. Lamonte Sakai did not think that anything else needed to be done with respect to his COPD.  PHYSICAL EXAMINATION:  General:  This is an this is an elderly white gentleman in no obvious distress.  Vital signs:  Temperature of 98.3, pulse 110, respiratory rate 18, blood pressure 137/78.  Weight is 178 pounds.  Head and neck:  Normocephalic, atraumatic skull.  There are no ocular or oral lesions.  There are no palpable cervical or supraclavicular lymph nodes.  Lungs:  Clear bilaterally.  May be some slight decrease at the bases.  Cardiac:  Tachycardic but regular.  He has no murmurs, rubs or bruits.  Abdomen:  Soft.  He has good bowel sounds.  There is no fluid wave.  There is no palpable hepatosplenomegaly.  Back:  No tenderness over the spine, ribs, or hips. Extremities:  No clubbing, cyanosis or edema.  He has osteoarthritic changes in his joints.  He has 4/5 muscle strength bilaterally.  Skin: Some scattered  ecchymoses.  LABORATORY STUDIES:  White cell count is 4.1, hemoglobin 11.4, hematocrit 35.2, platelet count 148.  MCV is 99.  IMPRESSION:  [name] is an 81 year old gentleman with anemia.  The anemia is secondary to iron deficiency and, to some degree, renal insufficiency.  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  His hemoglobin really is holding steady.  We will see what his iron studies show.  I think we can probably get him back in 6 months.    ______________________________ [name], M.D. PRE/MEDQ  D:  10/08/2013  T:  10/09/2013  Job:  [id number]

## 2015-10-04 ENCOUNTER — Encounter: Payer: Self-pay | Admitting: Cardiology

## 2015-10-19 DIAGNOSIS — Z6826 Body mass index (BMI) 26.0-26.9, adult: Secondary | ICD-10-CM | POA: Diagnosis not present

## 2015-10-19 DIAGNOSIS — Z1389 Encounter for screening for other disorder: Secondary | ICD-10-CM | POA: Diagnosis not present

## 2015-10-19 DIAGNOSIS — Z9181 History of falling: Secondary | ICD-10-CM | POA: Diagnosis not present

## 2015-10-19 DIAGNOSIS — L309 Dermatitis, unspecified: Secondary | ICD-10-CM | POA: Diagnosis not present

## 2015-10-19 DIAGNOSIS — Z23 Encounter for immunization: Secondary | ICD-10-CM | POA: Diagnosis not present

## 2015-11-02 ENCOUNTER — Telehealth: Payer: Self-pay | Admitting: Emergency Medicine

## 2015-11-02 ENCOUNTER — Telehealth: Payer: Self-pay | Admitting: Cardiovascular Disease

## 2015-11-02 DIAGNOSIS — I4892 Unspecified atrial flutter: Secondary | ICD-10-CM

## 2015-11-02 DIAGNOSIS — Z7901 Long term (current) use of anticoagulants: Secondary | ICD-10-CM

## 2015-11-02 MED ORDER — TIOTROPIUM BROMIDE MONOHYDRATE 18 MCG IN CAPS: ORAL_CAPSULE | RESPIRATORY_TRACT | Status: DC

## 2015-11-02 MED ORDER — BUDESONIDE-FORMOTEROL FUMARATE 80-4.5 MCG/ACT IN AERO: 2.0000 | INHALATION_SPRAY | Freq: Two times a day (BID) | RESPIRATORY_TRACT | Status: DC

## 2015-11-02 MED ORDER — APIXABAN 5 MG PO TABS: 5.0000 mg | ORAL_TABLET | Freq: Two times a day (BID) | ORAL | Status: DC

## 2015-11-02 NOTE — Telephone Encounter (Signed)
Refill sent to pt's preferred pharmacy.

## 2015-11-02 NOTE — Telephone Encounter (Signed)
Patient is requesting a new prescription be called in his Eliquis for a 90 day supply.  Patient is in the donut hole and this will be cheaper.  [facility] call [facility] [address] in [place], [place].

## 2015-11-02 NOTE — Telephone Encounter (Signed)
Called spoke with daughter. Pt needs refills on symbicort/spiriva for 90 day supply. I have done so. Nothing further needed

## 2015-11-02 NOTE — Telephone Encounter (Signed)
[facility] was sent earlier [facility] and confirmed they did receive the 90 day supply but pt called in refill on the inhaler that was only for 30 day supply. Called spoke with daughter and made aware. Nothing further needed

## 2015-11-09 ENCOUNTER — Ambulatory Visit (HOSPITAL_BASED_OUTPATIENT_CLINIC_OR_DEPARTMENT_OTHER): Payer: Medicare Other

## 2015-11-09 ENCOUNTER — Ambulatory Visit (HOSPITAL_BASED_OUTPATIENT_CLINIC_OR_DEPARTMENT_OTHER): Payer: Medicare Other | Admitting: Family

## 2015-11-09 VITALS — BP 131/71 | HR 89 | Temp 97.4°F | Resp 20 | Wt 180.1 lb

## 2015-11-09 DIAGNOSIS — N189 Chronic kidney disease, unspecified: Secondary | ICD-10-CM

## 2015-11-09 DIAGNOSIS — D472 Monoclonal gammopathy: Secondary | ICD-10-CM

## 2015-11-09 DIAGNOSIS — D631 Anemia in chronic kidney disease: Secondary | ICD-10-CM | POA: Diagnosis not present

## 2015-11-09 DIAGNOSIS — D6489 Other specified anemias: Secondary | ICD-10-CM | POA: Diagnosis not present

## 2015-11-09 DIAGNOSIS — D509 Iron deficiency anemia, unspecified: Secondary | ICD-10-CM | POA: Diagnosis not present

## 2015-11-09 LAB — COMPREHENSIVE METABOLIC PANEL (CC13)
ALBUMIN: 3.8 g/dL (ref 3.5–5.0)
ALT: 32 U/L (ref 0–55)
AST: 29 U/L (ref 5–34)
Alkaline Phosphatase: 98 U/L (ref 40–150)
Anion Gap: 8 mEq/L (ref 3–11)
BILIRUBIN TOTAL: 1.24 mg/dL — AB (ref 0.20–1.20)
BUN: 19.2 mg/dL (ref 7.0–26.0)
CHLORIDE: 105 meq/L (ref 98–109)
CO2: 27 meq/L (ref 22–29)
Calcium: 9.9 mg/dL (ref 8.4–10.4)
Creatinine: 1.4 mg/dL — ABNORMAL HIGH (ref 0.7–1.3)
EGFR: 46 mL/min/{1.73_m2} — AB (ref >90)
GLUCOSE: 112 mg/dL (ref 70–140)
Potassium: 4.5 mEq/L (ref 3.5–5.1)
SODIUM: 140 meq/L (ref 136–145)
TOTAL PROTEIN: 7.4 g/dL (ref 6.4–8.3)

## 2015-11-09 LAB — CBC WITH DIFFERENTIAL (CANCER CENTER ONLY)
BASO#: 0.1 10*3/uL (ref 0.0–0.2)
BASO%: 1.3 % (ref 0.0–2.0)
EOS ABS: 0.6 10*3/uL — AB (ref 0.0–0.5)
EOS%: 10.7 % — ABNORMAL HIGH (ref 0.0–7.0)
HCT: 39.8 % (ref 38.7–49.9)
HGB: 13.1 g/dL (ref 13.0–17.1)
LYMPH#: 1.2 10*3/uL (ref 0.9–3.3)
LYMPH%: 22.7 % (ref 14.0–48.0)
MCH: 32 pg (ref 28.0–33.4)
MCHC: 32.9 g/dL (ref 32.0–35.9)
MCV: 97 fL (ref 82–98)
MONO#: 0.4 10*3/uL (ref 0.1–0.9)
MONO%: 7.2 % (ref 0.0–13.0)
NEUT#: 3.1 10*3/uL (ref 1.5–6.5)
NEUT%: 58.1 % (ref 40.0–80.0)
PLATELETS: 155 10*3/uL (ref 145–400)
RBC: 4.1 10*6/uL — ABNORMAL LOW (ref 4.20–5.70)
RDW: 12.6 % (ref 11.1–15.7)
WBC: 5.3 10*3/uL (ref 4.0–10.0)

## 2015-11-09 NOTE — Progress Notes (Signed)
Hematology and Oncology Follow Up Visit  [name] [id number] [birth year] 81 y.o. 11/09/2015   Principle Diagnosis:  1. 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 Anemia secondary to renal insufficiency 3. Intermittent iron-deficiency anemia  Current Therapy:   Observation    Interim History:  [name] is here today with his daughter for a follow-up. He is doing well and has no complaints at this time.  He last had Feraheme in May. His iron saturation at that time was 20%. He usually requires an infusion every 12-18 months. His Hgb has come up nicely to 13.1 with an MCV of 97. In May his m-spike was 0.2 g/dl, IgG level was 1320 mg/dl and lamba light chain was 8.37 mg/dl.  He has had no issue with infections. No fever, chills, n/v, rash dizziness, chest pain, palpitations, abdominal pain or changes in bowel or bladder habits.  He has some SOB with exertion due to emphysema.   He has some right flank pain due to kidney stones. He also has stones in the left kidney but that side has not bothered him. No swelling, tenderness, numbness or tingling in his extremities. No c/o pain at this time.  He has a healthy appetite and is staying well hydrated.  He continues to stay active bowling twice a week. 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   Medications:    Medication List       This list is accurate as of: 11/09/15 12:22 PM.  Always use your most recent med list.               apixaban 5 MG Tabs tablet  Commonly known as:  ELIQUIS  Take 1 tablet (5 mg total) by mouth every 12 (twelve) hours.     atorvastatin 40 MG tablet  Commonly known as:  LIPITOR  TAKE 1 TABLET (40 MG TOTAL) BY MOUTH EVERY EVENING.     budesonide-formoterol 80-4.5 MCG/ACT inhaler  Commonly known as:  SYMBICORT  Inhale 2 puffs into the lungs 2 (two) times daily.     CVS SENIOR PROBIOTIC PO  Take 1 capsule by mouth at bedtime. Once daily     CVS  SPECTRAVITE SENIOR Tabs  Take 1 tablet by mouth every morning.     diphenhydramine-acetaminophen 25-500 MG Tabs tablet  Commonly known as:  TYLENOL PM  Take 2 tablets by mouth at bedtime.     ENSURE  Take 1 Can by mouth every morning.     guaifenesin 400 MG Tabs tablet  Commonly known as:  HUMIBID E  Take 400 mg by mouth daily.     metoprolol tartrate 25 MG tablet  Commonly known as:  LOPRESSOR  Take 1.5 tablets (37.5 mg total) by mouth 2 (two) times daily.     midodrine 5 MG tablet  Commonly known as:  PROAMATINE  TAKE 1 TABLET (5 MG TOTAL) BY MOUTH 3 (THREE) TIMES DAILY.     niacin 500 MG CR tablet  Commonly known as:  NIASPAN  Take 1 tablet (500 mg total) by mouth at bedtime. PATIENT NEEDS TO CONTACT OFFICE FOR ADDITIONAL REFILLS     tamsulosin 0.4 MG Caps capsule  Commonly known as:  FLOMAX  Take 0.4 mg by mouth at bedtime.     tiotropium 18 MCG inhalation capsule  Commonly known as:  SPIRIVA HANDIHALER  PLACE 1 CAPSULE (18 MCG TOTAL) INTO INHALER AND INHALE EVERY MORNING.        Allergies: No  Known Allergies  Past Medical History, Surgical history, Social history, and Family History were reviewed and updated.  Review of Systems: All other 10 point review of systems is negative.   Physical Exam:  vitals were not taken for this visit.  Wt Readings from Last 3 Encounters:  08/17/15 184 lb 9.6 oz (83.734 kg)  05/26/15 188 lb (85.276 kg)  05/11/15 187 lb (84.823 kg)    Ocular: Sclerae unicteric, pupils equal, round and reactive to light Ear-nose-throat: Oropharynx clear, dentition fair Lymphatic: No cervical or supraclavicular adenopathy Lungs no rales or rhonchi, good excursion bilaterally Heart regular rate and rhythm, no murmur appreciated Abd soft, nontender, positive bowel sounds MSK no focal spinal tenderness, no joint edema Neuro: non-focal, well-oriented, appropriate affect Breasts: Deferred  Lab Results  Component Value Date   WBC 3.7*  08/15/2015   HGB 11.0* 08/15/2015   HCT 33.9* 08/15/2015   MCV 96.6 08/15/2015   PLT 142* 08/15/2015   Lab Results  Component Value Date   FERRITIN 231 05/11/2015   IRON 51 05/11/2015   TIBC 254 05/11/2015   UIBC 203 05/11/2015   IRONPCTSAT 20 05/11/2015   Lab Results  Component Value Date   RETICCTPCT 1.0 12/05/2011   RETICCTPCT 1.0 12/05/2011   RETICCTPCT 1.0 12/05/2011   RETICCTPCT 1.0 12/05/2011   RETICCTPCT 1.0 12/05/2011   RBC 3.51* 08/15/2015   RETICCTABS 38.1 12/05/2011   RETICCTABS 38.1 12/05/2011   RETICCTABS 38.1 12/05/2011   RETICCTABS 38.1 12/05/2011   RETICCTABS 38.1 12/05/2011   Lab Results  Component Value Date   KPAFRELGTCHN 3.68* 05/11/2015   LAMBDASER 8.37* 05/11/2015   KAPLAMBRATIO 0.44 05/11/2015   Lab Results  Component Value Date   IGGSERUM 1320 05/11/2015   IGA 169 05/11/2015   IGMSERUM 283* 05/11/2015   Lab Results  Component Value Date   TOTALPROTELP 6.9 05/11/2015   ALBUMINELP 3.8 05/11/2015   A1GS 0.3 05/11/2015   A2GS 0.7 05/11/2015   BETS 0.4 05/11/2015   BETA2SER 0.4 05/11/2015   GAMS 1.3 05/11/2015   MSPIKE 0.43 11/10/2014   SPEI * 05/11/2015     Chemistry      Component Value Date/Time   NA 141 08/15/2015 1149   K 4.9 08/15/2015 1149   CL 107 08/15/2015 1149   CO2 24 08/15/2015 1149   BUN 20 08/15/2015 1149   CREATININE 1.25* 08/15/2015 1149   CREATININE 1.34 05/11/2015 1157      Component Value Date/Time   CALCIUM 8.9 08/15/2015 1149   ALKPHOS 73 08/15/2015 1149   AST 23 08/15/2015 1149   ALT 24 08/15/2015 1149   BILITOT 1.5* 08/15/2015 1149     Impression and Plan: [name] is an 81 yo white male with multifactorial anemia. 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 He is doing well and is asymptomatic at this time.  He last received Feraheme in May of this year and has responded nicely. His Hgb is 13.1 today. We will see what  his iron studies show. He typically required an infusion once a year.  We will plan to see him back in 6 months for labs and follow-up.  Both he and his daughter know to contact us with any questions or concerns. We can certainly see him sooner if need be.   Eliezer Bottom, NP 11/10/201612:22 PM

## 2015-11-10 ENCOUNTER — Ambulatory Visit: Payer: Medicare Other | Admitting: Hematology & Oncology

## 2015-11-10 ENCOUNTER — Other Ambulatory Visit: Payer: Medicare Other

## 2015-11-10 LAB — FERRITIN CHCC: FERRITIN: 403 ng/mL — AB (ref 22–316)

## 2015-11-10 LAB — IRON AND TIBC CHCC
%SAT: 35 % (ref 20–55)
IRON: 87 ug/dL (ref 42–163)
TIBC: 249 ug/dL (ref 202–409)
UIBC: 162 ug/dL (ref 117–376)

## 2015-11-13 LAB — RETICULOCYTES (CHCC)
ABS Retic: 33.2 10*3/uL (ref 19.0–186.0)
RBC.: 4.15 MIL/uL — ABNORMAL LOW (ref 4.22–5.81)
Retic Ct Pct: 0.8 % (ref 0.4–2.3)

## 2015-11-13 LAB — IGG, IGA, IGM
IGA: 181 mg/dL (ref 68–379)
IGM, SERUM: 334 mg/dL — AB (ref 41–251)
IgG (Immunoglobin G), Serum: 1170 mg/dL (ref 650–1600)

## 2015-11-13 LAB — PROTEIN ELECTROPHORESIS, SERUM, WITH REFLEX
ALBUMIN ELP: 3.8 g/dL (ref 3.8–4.8)
ALPHA-1-GLOBULIN: 0.3 g/dL (ref 0.2–0.3)
Abnormal Protein Band1: 0.4 g/dL
Alpha-2-Globulin: 0.8 g/dL (ref 0.5–0.9)
Beta 2: 0.4 g/dL (ref 0.2–0.5)
Beta Globulin: 0.4 g/dL (ref 0.4–0.6)
GAMMA GLOBULIN: 1.3 g/dL (ref 0.8–1.7)
Total Protein, Serum Electrophoresis: 7.1 g/dL (ref 6.1–8.1)

## 2015-11-13 LAB — IFE INTERPRETATION

## 2015-11-13 LAB — KAPPA/LAMBDA LIGHT CHAINS
Kappa free light chain: 4.45 mg/dL — ABNORMAL HIGH (ref 0.33–1.94)
Kappa:Lambda Ratio: 0.48 (ref 0.26–1.65)
Lambda Free Lght Chn: 9.33 mg/dL — ABNORMAL HIGH (ref 0.57–2.63)

## 2015-11-13 LAB — ERYTHROPOIETIN: ERYTHROPOIETIN: 14.5 m[IU]/mL (ref 2.6–18.5)

## 2015-11-16 ENCOUNTER — Other Ambulatory Visit: Payer: Self-pay | Admitting: Cardiovascular Disease

## 2015-11-16 MED ORDER — NIACIN ER (ANTIHYPERLIPIDEMIC) 500 MG PO TBCR: 500.0000 mg | EXTENDED_RELEASE_TABLET | Freq: Every day | ORAL | Status: DC

## 2015-11-16 MED ORDER — ATORVASTATIN CALCIUM 40 MG PO TABS: ORAL_TABLET | ORAL | Status: DC

## 2015-11-16 NOTE — Telephone Encounter (Signed)
Pt's Rx was sent to pt's pharmacy requested. Confirmation received. °

## 2015-11-16 NOTE — Telephone Encounter (Signed)
°*  STAT* If patient is at the pharmacy, call can be transferred to refill team.   1. Which medications need to be refilled? (please list name of each medication and dose if known) Niacin and Atorvastatin-needs new prescriptions for 90 days instead of 30  2. Which pharmacy/location (including street and city if local pharmacy) is medication to be sent to?[facility]  3. Do they need a 30 day or 90 day supply? 90 and refills

## 2015-11-30 ENCOUNTER — Encounter: Payer: Self-pay | Admitting: Emergency Medicine

## 2015-11-30 ENCOUNTER — Telehealth: Payer: Self-pay | Admitting: Cardiology

## 2015-11-30 ENCOUNTER — Ambulatory Visit (INDEPENDENT_AMBULATORY_CARE_PROVIDER_SITE_OTHER): Payer: Medicare Other | Admitting: Emergency Medicine

## 2015-11-30 ENCOUNTER — Ambulatory Visit (INDEPENDENT_AMBULATORY_CARE_PROVIDER_SITE_OTHER): Payer: Medicare Other | Admitting: *Deleted

## 2015-11-30 VITALS — BP 140/80 | HR 82 | Ht 70.0 in | Wt 183.0 lb

## 2015-11-30 DIAGNOSIS — I4430 Unspecified atrioventricular block: Secondary | ICD-10-CM | POA: Diagnosis not present

## 2015-11-30 DIAGNOSIS — J449 Chronic obstructive pulmonary disease, unspecified: Secondary | ICD-10-CM | POA: Diagnosis not present

## 2015-11-30 DIAGNOSIS — I25810 Atherosclerosis of coronary artery bypass graft(s) without angina pectoris: Secondary | ICD-10-CM

## 2015-11-30 MED ORDER — ALBUTEROL SULFATE HFA 108 (90 BASE) MCG/ACT IN AERS: 2.0000 | INHALATION_SPRAY | RESPIRATORY_TRACT | Status: DC | PRN

## 2015-11-30 NOTE — Patient Instructions (Signed)
Please continue your spiriva and symbicort as you are taking them  We will restart your albuterol, use 2 puffs up to every 4 hours if needed for shortness of breath.  Follow with Dr Lamonte Sakai in 12 months or sooner if you have any problems

## 2015-11-30 NOTE — Assessment & Plan Note (Signed)
Doing very well. He does have some slight increase in his exertional dyspnea but he continues to be very active. He no longer has an albuterol inhaler and we will get one for him. We will continue his Spiriva and Symbicort as he has been taking it, insure that he has the flu shot annually. His pneumonia vaccinations are up-to-date. Follow in one year or when necessary

## 2015-11-30 NOTE — Telephone Encounter (Signed)
LMOVM reminding pt to send remote transmission.   

## 2015-11-30 NOTE — Progress Notes (Signed)
81 yo man with COPD, CAD/CABG (06) followed by Dr Claiborne Billings.   ROV 09/10/12 -- COPD, allergic rhinitis and associated cough. Not on nasonex right now. He is on loratadine, spiriva + symbicort. Doing well. Last AE was this Spring. Needs the flu shot.   ROV 03/18/13 -- COPD, allergies, CAD, MGUS ([place]). Has been doing well since last time. Never uses SABA. No exacerbations since last time.  He follows with Dr Claiborne Billings for TTE 04/13/13.  On loratadine, allergies well control.   ROV 08/26/13 -- COPD, allergies, CAD. Hx cough. He has noticed more exertional SOB for last 5-6 months. 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 He is on spiriva and symbicort. He was asked by Dr Claiborne Billings to back off some on his aerobic exercise and at the [employer]. Wt is stable. Very little cough, no wheeze. Never uses albuterol. He had an episode of syncope at bowling alley June 24 > daughter reports that he had SVT, started on metoprolol.   ROV 10/07/13 -- COPD and allergic rhinitis, cough. Last time we ordered repeat PFT due to worsening SOB. The breathing is still about the same - not as good as 1-2 years ago. His PFT show stable spirometry. The lung volumes show a decreased TLC compared with 2009.   ROV 08/26/14 -- COPD and allergic rhinitis, cough. He is doing well - continues to stay active. He has slowed down some but very little. 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 He is on Spiriva and Symbicort.  No flares, cough, wheeze.   ROV 11/30/15 -- patient has a history of COPD and allergic rhinitis. He's also been followed by Dr Katheran Awe for MGUS and Dr. Claiborne Billings for coronary disease. He has been managed on symbicort and spiriva.  He is having a but more heavy breathing since last time, but he still bowls, plans to do the [employer]. No flares since last time. He does not have an albuterol inhaler.                                                                                           CAT Score 08/26/2014 03/18/2013  Total CAT Score 13 6      EXAM :  Filed Vitals:   11/30/15 1515  BP: 140/80  Pulse: 82  Height: 5\' 10"  (1.778 m)  Weight: 183 lb (83.008 kg)  SpO2: 96%   Gen: Pleasant,  in no distress,  normal affect  ENT: No lesions,  mouth clear,  oropharynx clear, no postnasal drip  Neck: No JVD, no TMG, no carotid bruits  Lungs: No use of accessory muscles, distant, no wheeze or crackles, diminshed BS in bases   Cardiovascular: RRR, heart sounds normal, no murmur or gallops, no peripheral edema  Musculoskeletal: No deformities, no cyanosis or clubbing  Neuro: alert, non focal  Skin: Warm, no lesions or rashes   COPD (chronic obstructive pulmonary disease) Doing very well. He does have some slight increase in his exertional dyspnea but he continues to be very active. He no longer has an albuterol  inhaler and we will get one for him. We will continue his Spiriva and Symbicort as he has been taking it, insure that he has the flu shot annually. His pneumonia vaccinations are up-to-date. Follow in one year or when necessary

## 2015-11-30 NOTE — Addendum Note (Signed)
Addended by: Desmond Dike C on: 11/30/2015 03:44 PM   Modules accepted: Orders

## 2015-12-01 NOTE — Progress Notes (Signed)
Remote pacemaker transmission.   

## 2015-12-10 LAB — CUP PACEART REMOTE DEVICE CHECK
Battery Impedance: 110 Ohm
Battery Voltage: 2.8 V
Implantable Lead Implant Date: 20150206
Implantable Lead Location: 753859
Implantable Lead Model: 5076
Lead Channel Impedance Value: 464 Ohm
Lead Channel Impedance Value: 552 Ohm
Lead Channel Sensing Intrinsic Amplitude: 2.8 mV
Lead Channel Setting Pacing Pulse Width: 0.4 ms
MDC IDC LEAD IMPLANT DT: 20150206
MDC IDC LEAD LOCATION: 753860
MDC IDC MSMT BATTERY REMAINING LONGEVITY: 150 mo
MDC IDC MSMT LEADCHNL RA PACING THRESHOLD AMPLITUDE: 0.75 V
MDC IDC MSMT LEADCHNL RA PACING THRESHOLD PULSEWIDTH: 0.4 ms
MDC IDC MSMT LEADCHNL RV PACING THRESHOLD AMPLITUDE: 0.375 V
MDC IDC MSMT LEADCHNL RV PACING THRESHOLD PULSEWIDTH: 0.4 ms
MDC IDC MSMT LEADCHNL RV SENSING INTR AMPL: 11.2 mV
MDC IDC SESS DTM: 20161201232729
MDC IDC SET LEADCHNL RA PACING AMPLITUDE: 2 V
MDC IDC SET LEADCHNL RV PACING AMPLITUDE: 2.5 V
MDC IDC SET LEADCHNL RV SENSING SENSITIVITY: 4 mV
MDC IDC STAT BRADY AP VP PERCENT: 2 %
MDC IDC STAT BRADY AP VS PERCENT: 33 %
MDC IDC STAT BRADY AS VP PERCENT: 4 %
MDC IDC STAT BRADY AS VS PERCENT: 61 %

## 2015-12-12 ENCOUNTER — Encounter: Payer: Self-pay | Admitting: Cardiology

## 2016-02-11 ENCOUNTER — Other Ambulatory Visit: Payer: Self-pay | Admitting: Cardiovascular Disease

## 2016-02-12 ENCOUNTER — Other Ambulatory Visit: Payer: Self-pay | Admitting: Cardiovascular Disease

## 2016-02-12 NOTE — Telephone Encounter (Signed)
Rx request sent to pharmacy.  

## 2016-03-08 ENCOUNTER — Encounter: Payer: Self-pay | Admitting: Cardiovascular Disease

## 2016-03-08 ENCOUNTER — Ambulatory Visit (INDEPENDENT_AMBULATORY_CARE_PROVIDER_SITE_OTHER): Payer: Medicare Other | Admitting: Cardiovascular Disease

## 2016-03-08 VITALS — BP 130/77 | HR 96 | Ht 70.0 in | Wt 187.7 lb

## 2016-03-08 DIAGNOSIS — J449 Chronic obstructive pulmonary disease, unspecified: Secondary | ICD-10-CM

## 2016-03-08 DIAGNOSIS — I2510 Atherosclerotic heart disease of native coronary artery without angina pectoris: Secondary | ICD-10-CM

## 2016-03-08 DIAGNOSIS — Z951 Presence of aortocoronary bypass graft: Secondary | ICD-10-CM | POA: Diagnosis not present

## 2016-03-08 DIAGNOSIS — I4430 Unspecified atrioventricular block: Secondary | ICD-10-CM

## 2016-03-08 DIAGNOSIS — E785 Hyperlipidemia, unspecified: Secondary | ICD-10-CM

## 2016-03-08 DIAGNOSIS — Z7901 Long term (current) use of anticoagulants: Secondary | ICD-10-CM

## 2016-03-08 DIAGNOSIS — I4892 Unspecified atrial flutter: Secondary | ICD-10-CM | POA: Diagnosis not present

## 2016-03-08 DIAGNOSIS — I951 Orthostatic hypotension: Secondary | ICD-10-CM

## 2016-03-08 DIAGNOSIS — Z950 Presence of cardiac pacemaker: Secondary | ICD-10-CM | POA: Diagnosis not present

## 2016-03-08 NOTE — Patient Instructions (Signed)
Remote monitoring is used to monitor your Pacemaker from home. This monitoring reduces the number of office visits required to check your device to one time per year. It allows [place] to monitor the functioning of your device to ensure it is working properly. You are scheduled for a device check from home on June 09, 2016. You may send your transmission at any time that day. If you have a wireless device, the transmission will be sent automatically. After your physician reviews your transmission, you will receive a postcard with your next transmission date.  Dr. Sallyanne Kuster recommends that you schedule a follow-up appointment in: [place] ([facility]).

## 2016-03-09 MED ORDER — MIDODRINE HCL 5 MG PO TABS: ORAL_TABLET | ORAL | Status: DC

## 2016-03-09 MED ORDER — APIXABAN 5 MG PO TABS
5.0000 mg | ORAL_TABLET | Freq: Two times a day (BID) | ORAL | Status: DC
Start: 2016-03-09 — End: 2016-12-18

## 2016-03-09 MED ORDER — METOPROLOL TARTRATE 25 MG PO TABS: 37.5000 mg | ORAL_TABLET | Freq: Two times a day (BID) | ORAL | Status: DC

## 2016-03-09 MED ORDER — NIACIN ER (ANTIHYPERLIPIDEMIC) 500 MG PO TBCR: 500.0000 mg | EXTENDED_RELEASE_TABLET | Freq: Every day | ORAL | Status: DC

## 2016-03-09 MED ORDER — ATORVASTATIN CALCIUM 40 MG PO TABS: ORAL_TABLET | ORAL | Status: DC

## 2016-03-09 NOTE — Progress Notes (Signed)
Patient ID: [name], male   DOB: 11/28/1929, 82 y.o.   MRN: [id number]    Cardiology Office Note    Date:  03/09/2016   ID:  [name], DOB 01-16-1929, MRN [id number]  PCP:  Leonides Sake, MD  Cardiologist: Shelva Majestic, MD;  Sanda Klein, MD   Chief Complaint  Patient presents with  . Pacemaker Check    Medtronic (GRAY)  Annual visit--pt c/o SOB (emphysema, asthma), dizziness when he bends over    History of Present Illness:  [name] is a 82 y.o. 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  He generally feels well although he continues to have functional class II exertional dyspnea and feels dizzy when he stands up after bending over. He has not had syncope or falls. He denies angina pectoris. He is very hard of hearing. He has not had lower extremity edema. He denies bleeding or focal neurological deficits. He reports perfect compliance with anticoagulant medication.  Ace maker interrogation shows normal device function. The device generator has approximately 12 years of estimated remaining longevity. There is 32% atrial pacing and 6% ventricular pacing. The overall burden of atrial mode switch has been 8.8%. One episode (for which there is no electrogram) has a very high atrial rate suggestive of atrial fibrillation, but most of the episodes of atrial mode switch appeared to be atrial flutter with rates of around 230 bpm. The patient is currently in [place] episode of lengthy persistent atrial flutter that began on February 1 (brief episodes of reported normal rhythm likely represent transient undersensing of the atrial waves). The average ventricular rate has been in the 80-90 range.  Today's electrocardiogram shows atrial flutter with variable degrees of AV block, incomplete  right bundle branch block and an old inferior wall myocardial infarction. The ventricular rate is 96 bpm. QTC is 439 ms  Neither the patient nor his family believe that there has been any change in his overall symptomatology (either his dyspnea or his dizziness) during the last 6 weeks of atrial flutter, as compared to previous months in normal rhythm.  We attempted overdrive burst pacing of the atrial flutter unsuccessfully today.  Past Medical History  Diagnosis Date  . Coronary heart disease     2D Echo, 04/13/2013 - EF 55-60%, moderate regurg of the tricuspid valve  . COPD (chronic obstructive pulmonary disease) ([place])   . MGUS (monoclonal gammopathy of unknown significance) 12/05/2011  . Anemia of renal disease 12/05/2011  . Anemia, iron deficiency 12/05/2011  . CAS (cerebral atherosclerosis)     Carotid Dopplers, 04/09/2012 - Bilateral Bulb/Proximal ICAs-mild to moderate amount of fibrous plaque of fibrous soft plaque w/o evidence of a significant diameter reduction, dissection, or any other vascular abnormality  . Nonspecific ST-T wave electrocardiographic changes     Lexiscan, 06/02/2012 - EKG negative for ischemia, compared to previous study-there is no significant change, normal myocardial perfusion study  . Asthma   . Dysrhythmia 11/09/2013    SVT VS A FLUTTER   . Shortness of breath   . Kidney stones   . Status post placement of implantable loop recorder 12/09/2013    Past Surgical History  Procedure Laterality Date  . Cardiac catheterization  12/05/2005    Recommended CABG  . Coronary artery bypass graft  12/06/2005    x4, LIMA to LAD, vein to distal  circumflex, sequential vein to intermediate and obtuse marginal vessel  . Kidney stone surgery    . Inner ear surgery    . Cataract extraction    . Tee without cardioversion N/A 11/09/2013    Procedure: TRANSESOPHAGEAL ECHOCARDIOGRAM (TEE);  Surgeon: Pixie Casino, MD;  Location: [facility] ENDOSCOPY;  Service: Cardiovascular;   Laterality: N/A;  . Cardioversion N/A 11/09/2013    Procedure: CARDIOVERSION;  Surgeon: Pixie Casino, MD;  Location: [facility];  Service: Cardiovascular;  Laterality: N/A;  . Loop recorder implant N/A 11/30/2013    Procedure: LOOP RECORDER IMPLANT;  Surgeon: Sanda Klein, MD;  Location: [facility] CATH LAB;  Service: Cardiovascular;  Laterality: N/A;  . Permanent pacemaker insertion N/A 02/04/2014    Procedure: PERMANENT PACEMAKER INSERTION;  Surgeon: Sanda Klein, MD;  Location: [place] CATH LAB;  Service: Cardiovascular;  Laterality: N/A;    Outpatient Prescriptions Prior to Visit  Medication Sig Dispense Refill  . albuterol (PROVENTIL HFA;VENTOLIN HFA) 108 (90 BASE) MCG/ACT inhaler Inhale 2 puffs into the lungs every 4 (four) hours as needed for wheezing or shortness of breath. 1 Inhaler 6  . budesonide-formoterol (SYMBICORT) 80-4.5 MCG/ACT inhaler Inhale 2 puffs into the lungs 2 (two) times daily. 3 Inhaler 1  . diphenhydramine-acetaminophen (TYLENOL PM) 25-500 MG TABS Take 2 tablets by mouth at bedtime.     . ENSURE (ENSURE) Take 1 Can by mouth every morning.    . Multiple Vitamins-Minerals (CVS SPECTRAVITE SENIOR) TABS Take 1 tablet by mouth every morning.     . Probiotic Product (CVS SENIOR PROBIOTIC PO) Take 1 capsule by mouth at bedtime. Once daily    . Tamsulosin HCl (FLOMAX) 0.4 MG CAPS Take 0.4 mg by mouth at bedtime.     Marland Kitchen tiotropium (SPIRIVA HANDIHALER) 18 MCG inhalation capsule PLACE 1 CAPSULE (18 MCG TOTAL) INTO INHALER AND INHALE EVERY MORNING. 90 capsule 1  . apixaban (ELIQUIS) 5 MG TABS tablet Take 1 tablet (5 mg total) by mouth every 12 (twelve) hours. 180 tablet 1  . atorvastatin (LIPITOR) 40 MG tablet TAKE 1 TABLET (40 MG TOTAL) BY MOUTH EVERY EVENING. 90 tablet 0  . metoprolol tartrate (LOPRESSOR) 25 MG tablet Take 1.5 tablets (37.5 mg total) by mouth 2 (two) times daily. 270 tablet 3  . midodrine (PROAMATINE) 5 MG tablet TAKE 1 TABLET (5 MG TOTAL) BY MOUTH 3 (THREE) TIMES  DAILY. 270 tablet 2  . niacin (NIASPAN) 500 MG CR tablet Take 1 tablet (500 mg total) by mouth at bedtime. 90 tablet 0  . triamcinolone cream (KENALOG) 0.1 % APPLY CREAM TO AFFECTED AREA TWICE A DAY TO AFFECTED HAND UNTIL IMPROVED  1   No facility-administered medications prior to visit.     Allergies:   Review of patient's allergies indicates no known allergies.   Social History   Social History  . Marital Status: Married    Spouse Name: N/A  . Number of Children: N/A  . Years of Education: N/A   Occupational History  . retired long distance Administrator   . maintenence    Social History Main Topics  . Smoking status: Former Smoker -- 1.00 packs/day for 47 years    Types: Cigarettes    Start date: 12/02/1951    Quit date: 12/30/1993  . Smokeless tobacco: Never Used     Comment: quit 20 years ago  . Alcohol Use: No  . Drug Use: No  . Sexual Activity: Not Asked   Other Topics Concern  . None   Social  History Narrative     Family History:  The patient's family history includes Emphysema in his daughter; Heart attack in his father and mother; Heart disease in his brother and mother; Tuberculosis in his father.   ROS:   Please see the history of present illness.    ROS All other systems reviewed and are negative.   PHYSICAL EXAM:   VS:  BP 130/77 mmHg  Pulse 96  Ht 5\' 10"  (1.778 m)  Wt 85.14 kg (187 lb 11.2 oz)  BMI 26.93 kg/m2   GEN: Well nourished, well developed, in no acute distress HEENT: normal Neck: no JVD, carotid bruits, or masses Cardiac: irregular; no murmurs, rubs, or gallops,no edema  Respiratory:  clear to auscultation bilaterally, normal work of breathing GI: soft, nontender, nondistended, + BS MS: no deformity or atrophy Skin: warm and dry, no rash Neuro:  Alert and Oriented x 3, Strength and sensation are intact Psych: euthymic mood, full affect  Wt Readings from Last 3 Encounters:  03/08/16 85.14 kg (187 lb 11.2 oz)  11/30/15 83.008 kg  (183 lb)  11/09/15 81.702 kg (180 lb 1.9 oz)      Studies/Labs Reviewed:   EKG:  EKG is ordered today.  The ekg ordered today demonstrates atrial flutter with variable response, incomplete right bundle branch block, old inferior wall myocardial infarctions  Recent Labs: 08/15/2015: TSH 3.117 11/09/2015: ALT 32; BUN 19.2; Creatinine 1.4*; HGB 13.1; Platelets 155; Potassium 4.5; Sodium 140   Lipid Panel    Component Value Date/Time   CHOL 99* 08/15/2015 1151   TRIG 143 08/15/2015 1151   HDL 30* 08/15/2015 1151   CHOLHDL 3.3 08/15/2015 1151   VLDL 29 08/15/2015 1151   [place] 40 08/15/2015 1151    Additional studies/ records that were reviewed today include:  notes from pulmonary clinic in December (COPD) , oncology clinic in September (MGUS, anemia) and Dr. Evette Georges office notes from August and previous pacemaker downloads    ASSESSMENT:    1. Atrial flutter, unspecified type ([place])   2. Long term (current) use of anticoagulants   3. AVB (atrioventricular block)- high grade second degree   4. Pacemaker- MDT 02/04/14   5. Atherosclerosis of native coronary artery of native heart without angina pectoris   6. S/P CABG x 4   7. Hyperlipidemia   8. Chronic obstructive pulmonary disease, unspecified COPD type ([place])   9. Orthostatic hypotension      PLAN:  In order of problems listed above:  1. Persistent atrial flutter: Rate control is adequate. Attempts at overdrive pacing were performed in the clinic today. Attempted burst atrial pacing at cycle length starting at 280 ms all the way down to 200 ms without ever convincingly entraining the arrhythmia circuit. This suggests that this may be atypical, possibly left-sided atrial flutter. Discussed options for further arrhythmia therapy with cardioversion and antiarrhythmics and even ablation with the patient and his family. They do not think that the arrhythmia has led to any change in functional status or severe symptoms. They prefer  conservative management and I think this is very reasonable. 2. Anticoagulation: Tolerating Eliquis without bleeding problems, this should be continued due to high embolic risk. CHADSVasc 3 (age 59, CAD). 3. Second degree AV block: Intermittent, relatively low percentage of ventricular pacing 4. PM: Normal device function, unable to overdrive that he atrial flutter, possibly because the atrial lead is far away from the reentry circuit. No device reprogramming was necessary today. Will perform a remote download in 3  months and office visit in 6 months 5. CAD s/p CABG:  Asymptomatic 6. S/p CABG  7. HLP: Excellent reduction in LDL cholesterol on current statin dose 8. COPD: This is the dominant cause of his exertional dyspnea 9. Orthostatic hypotension was probably the cause of his syncope about a year ago. No recurrences on current treatment, reminded him to avoid lying down after taking ProAmatine for several hours. Voids sudden changes in position. Stay well hydrated.    Medication Adjustments/Labs and Tests Ordered: Current medicines are reviewed at length with the patient today.  Concerns regarding medicines are outlined above.  Medication changes, Labs and Tests ordered today are listed in the Patient Instructions below. Patient Instructions  Remote monitoring is used to monitor your Pacemaker from home. This monitoring reduces the number of office visits required to check your device to one time per year. It allows [place] to monitor the functioning of your device to ensure it is working properly. You are scheduled for a device check from home on June 09, 2016. You may send your transmission at any time that day. If you have a wireless device, the transmission will be sent automatically. After your physician reviews your transmission, you will receive a postcard with your next transmission date.  Dr. Sallyanne Kuster recommends that you schedule a follow-up appointment in: [place]  ([facility]).      Mikael Spray, MD  03/09/2016 2:28 PM    [facility] [address], [place], [place]  [address] Phone: [phone]; Fax: [phone]

## 2016-03-20 LAB — CUP PACEART INCLINIC DEVICE CHECK
Brady Statistic AS VS Percent: 64 %
Implantable Lead Implant Date: 20150206
Implantable Lead Implant Date: 20150206
Implantable Lead Location: 753859
Implantable Lead Model: 5076
Lead Channel Impedance Value: 517 Ohm
Lead Channel Setting Pacing Amplitude: 2.5 V
MDC IDC LEAD LOCATION: 753860
MDC IDC MSMT BATTERY IMPEDANCE: 134 Ohm
MDC IDC MSMT BATTERY REMAINING LONGEVITY: 143 mo
MDC IDC MSMT BATTERY VOLTAGE: 2.79 V
MDC IDC MSMT LEADCHNL RA IMPEDANCE VALUE: 476 Ohm
MDC IDC SESS DTM: 20170310195906
MDC IDC SET LEADCHNL RA PACING AMPLITUDE: 2 V
MDC IDC SET LEADCHNL RV PACING PULSEWIDTH: 0.4 ms
MDC IDC SET LEADCHNL RV SENSING SENSITIVITY: 4 mV
MDC IDC STAT BRADY AP VP PERCENT: 2 %
MDC IDC STAT BRADY AP VS PERCENT: 30 %
MDC IDC STAT BRADY AS VP PERCENT: 4 %

## 2016-04-18 DIAGNOSIS — Z0000 Encounter for general adult medical examination without abnormal findings: Secondary | ICD-10-CM | POA: Diagnosis not present

## 2016-04-18 DIAGNOSIS — N200 Calculus of kidney: Secondary | ICD-10-CM | POA: Diagnosis not present

## 2016-04-18 DIAGNOSIS — N400 Enlarged prostate without lower urinary tract symptoms: Secondary | ICD-10-CM | POA: Diagnosis not present

## 2016-04-19 ENCOUNTER — Ambulatory Visit: Payer: Medicare Other | Admitting: Cardiovascular Disease

## 2016-04-23 ENCOUNTER — Encounter: Payer: Self-pay | Admitting: Physician Assistant

## 2016-04-23 ENCOUNTER — Ambulatory Visit (INDEPENDENT_AMBULATORY_CARE_PROVIDER_SITE_OTHER): Payer: Medicare Other | Admitting: Physician Assistant

## 2016-04-23 VITALS — BP 120/60 | HR 108 | Ht 70.0 in | Wt 189.0 lb

## 2016-04-23 DIAGNOSIS — Z950 Presence of cardiac pacemaker: Secondary | ICD-10-CM | POA: Diagnosis not present

## 2016-04-23 DIAGNOSIS — Z951 Presence of aortocoronary bypass graft: Secondary | ICD-10-CM | POA: Diagnosis not present

## 2016-04-23 DIAGNOSIS — E785 Hyperlipidemia, unspecified: Secondary | ICD-10-CM

## 2016-04-23 DIAGNOSIS — Z7901 Long term (current) use of anticoagulants: Secondary | ICD-10-CM | POA: Insufficient documentation

## 2016-04-23 DIAGNOSIS — I2510 Atherosclerotic heart disease of native coronary artery without angina pectoris: Secondary | ICD-10-CM | POA: Diagnosis not present

## 2016-04-23 DIAGNOSIS — I4892 Unspecified atrial flutter: Secondary | ICD-10-CM | POA: Diagnosis not present

## 2016-04-23 DIAGNOSIS — I2583 Coronary atherosclerosis due to lipid rich plaque: Secondary | ICD-10-CM

## 2016-04-23 MED ORDER — METOPROLOL TARTRATE 50 MG PO TABS: 50.0000 mg | ORAL_TABLET | Freq: Two times a day (BID) | ORAL | Status: DC

## 2016-04-23 MED ORDER — MIDODRINE HCL 5 MG PO TABS: ORAL_TABLET | ORAL | Status: DC

## 2016-04-23 MED ORDER — ATORVASTATIN CALCIUM 40 MG PO TABS: ORAL_TABLET | ORAL | Status: DC
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
## 2016-04-23 NOTE — Patient Instructions (Addendum)
Medication Instructions:   START TAKING METOPROLOL 50 MG TWICE A DAY   If you need a refill on your cardiac medications before your next appointment, please call your pharmacy.  Labwork: .NONE ORDER TODAY    Testing/Procedures:.NONE ORDER TODAY    Follow-Up: WITH DR Claiborne Billings IN 3 MONTHS    Any Other Special Instructions Will Be Listed Below (If Applicable).

## 2016-05-09 ENCOUNTER — Ambulatory Visit (HOSPITAL_BASED_OUTPATIENT_CLINIC_OR_DEPARTMENT_OTHER): Payer: Medicare Other | Admitting: Family

## 2016-05-09 ENCOUNTER — Encounter: Payer: Self-pay | Admitting: Family

## 2016-05-09 ENCOUNTER — Other Ambulatory Visit (HOSPITAL_BASED_OUTPATIENT_CLINIC_OR_DEPARTMENT_OTHER): Payer: Medicare Other

## 2016-05-09 VITALS — BP 136/66 | HR 86 | Temp 97.9°F | Resp 18 | Wt 191.0 lb

## 2016-05-09 DIAGNOSIS — D631 Anemia in chronic kidney disease: Secondary | ICD-10-CM

## 2016-05-09 DIAGNOSIS — N189 Chronic kidney disease, unspecified: Secondary | ICD-10-CM

## 2016-05-09 DIAGNOSIS — D472 Monoclonal gammopathy: Secondary | ICD-10-CM | POA: Diagnosis not present

## 2016-05-09 DIAGNOSIS — D509 Iron deficiency anemia, unspecified: Secondary | ICD-10-CM

## 2016-05-09 DIAGNOSIS — N289 Disorder of kidney and ureter, unspecified: Secondary | ICD-10-CM

## 2016-05-09 DIAGNOSIS — D6489 Other specified anemias: Secondary | ICD-10-CM | POA: Diagnosis not present

## 2016-05-09 LAB — IRON AND TIBC
%SAT: 35 % (ref 20–55)
IRON: 88 ug/dL (ref 42–163)
TIBC: 255 ug/dL (ref 202–409)
UIBC: 167 ug/dL (ref 117–376)

## 2016-05-09 LAB — COMPREHENSIVE METABOLIC PANEL
ALK PHOS: 93 U/L (ref 40–150)
ALT: 30 U/L (ref 0–55)
ANION GAP: 6 meq/L (ref 3–11)
AST: 26 U/L (ref 5–34)
Albumin: 3.8 g/dL (ref 3.5–5.0)
BILIRUBIN TOTAL: 1.44 mg/dL — AB (ref 0.20–1.20)
BUN: 24.6 mg/dL (ref 7.0–26.0)
CALCIUM: 9.6 mg/dL (ref 8.4–10.4)
CO2: 27 meq/L (ref 22–29)
CREATININE: 1.4 mg/dL — AB (ref 0.7–1.3)
Chloride: 110 mEq/L — ABNORMAL HIGH (ref 98–109)
EGFR: 44 mL/min/{1.73_m2} — AB (ref >90)
Glucose: 102 mg/dl (ref 70–140)
Potassium: 4.6 mEq/L (ref 3.5–5.1)
Sodium: 142 mEq/L (ref 136–145)
TOTAL PROTEIN: 7.4 g/dL (ref 6.4–8.3)

## 2016-05-09 LAB — CBC WITH DIFFERENTIAL (CANCER CENTER ONLY)
BASO#: 0 10*3/uL (ref 0.0–0.2)
BASO%: 1 % (ref 0.0–2.0)
EOS%: 2.7 % (ref 0.0–7.0)
Eosinophils Absolute: 0.1 10*3/uL (ref 0.0–0.5)
HCT: 34.8 % — ABNORMAL LOW (ref 38.7–49.9)
HGB: 11.3 g/dL — ABNORMAL LOW (ref 13.0–17.1)
LYMPH#: 1.2 10*3/uL (ref 0.9–3.3)
LYMPH%: 29.9 % (ref 14.0–48.0)
MCH: 32.1 pg (ref 28.0–33.4)
MCHC: 32.5 g/dL (ref 32.0–35.9)
MCV: 99 fL — ABNORMAL HIGH (ref 82–98)
MONO#: 0.5 10*3/uL (ref 0.1–0.9)
MONO%: 12.7 % (ref 0.0–13.0)
NEUT%: 53.7 % (ref 40.0–80.0)
NEUTROS ABS: 2.2 10*3/uL (ref 1.5–6.5)
Platelets: 132 10*3/uL — ABNORMAL LOW (ref 145–400)
RBC: 3.52 10*6/uL — ABNORMAL LOW (ref 4.20–5.70)
RDW: 13.2 % (ref 11.1–15.7)
WBC: 4 10*3/uL (ref 4.0–10.0)

## 2016-05-09 LAB — FERRITIN: FERRITIN: 325 ng/mL — AB (ref 22–316)
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
## 2016-05-10 DIAGNOSIS — Z6827 Body mass index (BMI) 27.0-27.9, adult: Secondary | ICD-10-CM | POA: Diagnosis not present

## 2016-05-10 DIAGNOSIS — I4892 Unspecified atrial flutter: Secondary | ICD-10-CM | POA: Diagnosis not present

## 2016-05-10 DIAGNOSIS — M545 Low back pain: Secondary | ICD-10-CM | POA: Diagnosis not present

## 2016-05-10 DIAGNOSIS — J449 Chronic obstructive pulmonary disease, unspecified: Secondary | ICD-10-CM | POA: Diagnosis not present

## 2016-05-10 DIAGNOSIS — M1712 Unilateral primary osteoarthritis, left knee: Secondary | ICD-10-CM | POA: Diagnosis not present

## 2016-05-10 DIAGNOSIS — E663 Overweight: Secondary | ICD-10-CM | POA: Diagnosis not present

## 2016-05-10 DIAGNOSIS — Z950 Presence of cardiac pacemaker: Secondary | ICD-10-CM | POA: Diagnosis not present

## 2016-05-10 DIAGNOSIS — Z89022 Acquired absence of left finger(s): Secondary | ICD-10-CM | POA: Diagnosis not present

## 2016-05-10 DIAGNOSIS — I2510 Atherosclerotic heart disease of native coronary artery without angina pectoris: Secondary | ICD-10-CM | POA: Diagnosis not present

## 2016-05-10 LAB — IGG, IGA, IGM
IGA/IMMUNOGLOBULIN A, SERUM: 152 mg/dL (ref 61–437)
IGM (IMMUNOGLOBIN M), SRM: 300 mg/dL — AB (ref 15–143)

## 2016-05-10 LAB — RETICULOCYTES: Reticulocyte Count: 1 % (ref 0.6–2.6)

## 2016-05-10 LAB — KAPPA/LAMBDA LIGHT CHAINS
Ig Kappa Free Light Chain: 40.61 mg/L — ABNORMAL HIGH (ref 3.30–19.40)
Ig Lambda Free Light Chain: 127.93 mg/L — ABNORMAL HIGH (ref 5.71–26.30)
KAPPA/LAMBDA FLC RATIO: 0.32 (ref 0.26–1.65)

## 2016-05-10 LAB — ERYTHROPOIETIN: Erythropoietin: 24.9 m[IU]/mL — ABNORMAL HIGH (ref 2.6–18.5)

## 2016-05-13 ENCOUNTER — Telehealth: Payer: Self-pay | Admitting: *Deleted

## 2016-05-13 LAB — PROTEIN ELECTROPHORESIS, SERUM, WITH REFLEX
A/G RATIO SPE: 1.2 (ref 0.7–1.7)
ALBUMIN: 3.7 g/dL (ref 2.9–4.4)
Alpha 1: 0.2 g/dL (ref 0.0–0.4)
Alpha 2: 0.7 g/dL (ref 0.4–1.0)
Beta: 0.9 g/dL (ref 0.7–1.3)
GAMMA GLOBULIN: 1.3 g/dL (ref 0.4–1.8)
Globulin, Total: 3.2 g/dL (ref 2.2–3.9)
Interpretation(See Below): 0
M-Spike, %: 0.5 g/dL — ABNORMAL HIGH
TOTAL PROTEIN: 6.9 g/dL (ref 6.0–8.5)

## 2016-05-13 NOTE — Telephone Encounter (Addendum)
Patient's daughter [name] aware of results.   ----- Message from Eliezer Bottom, NP sent at 05/09/2016  1:40 PM EDT ----- Regarding: iron Iron studies look good! No infusion needed at this time.   Sarah   ----- Message -----    From: [facility] Interface    Sent: 05/09/2016   9:45 AM      To: Eliezer Bottom, NP

## 2016-05-22 DIAGNOSIS — M1712 Unilateral primary osteoarthritis, left knee: Secondary | ICD-10-CM | POA: Diagnosis not present

## 2016-05-22 DIAGNOSIS — M25562 Pain in left knee: Secondary | ICD-10-CM | POA: Diagnosis not present

## 2016-05-30 ENCOUNTER — Other Ambulatory Visit: Payer: Self-pay | Admitting: Emergency Medicine

## 2016-05-30 ENCOUNTER — Other Ambulatory Visit: Payer: Self-pay | Admitting: Cardiovascular Disease

## 2016-05-30 NOTE — Telephone Encounter (Signed)
Rx(s) sent to pharmacy electronically.  

## 2016-06-10 ENCOUNTER — Telehealth: Payer: Self-pay | Admitting: Cardiology

## 2016-06-10 ENCOUNTER — Ambulatory Visit (INDEPENDENT_AMBULATORY_CARE_PROVIDER_SITE_OTHER): Payer: Medicare Other | Admitting: *Deleted

## 2016-06-10 DIAGNOSIS — Z950 Presence of cardiac pacemaker: Secondary | ICD-10-CM

## 2016-06-10 DIAGNOSIS — I480 Paroxysmal atrial fibrillation: Secondary | ICD-10-CM

## 2016-06-10 NOTE — Progress Notes (Signed)
Remote pacemaker transmission.   

## 2016-06-10 NOTE — Telephone Encounter (Signed)
Confirmed remote transmission w/ pt caregiver.   

## 2016-06-13 LAB — CUP PACEART REMOTE DEVICE CHECK
Battery Impedance: 135 Ohm
Brady Statistic AS VS Percent: 99 %
Date Time Interrogation Session: 20170612145008
Implantable Lead Location: 753859
Implantable Lead Location: 753860
Lead Channel Impedance Value: 476 Ohm
Lead Channel Pacing Threshold Pulse Width: 0.4 ms
Lead Channel Sensing Intrinsic Amplitude: 2.8 mV
Lead Channel Sensing Intrinsic Amplitude: 8 mV
Lead Channel Setting Pacing Amplitude: 2 V
Lead Channel Setting Pacing Pulse Width: 0.4 ms
MDC IDC LEAD IMPLANT DT: 20150206
MDC IDC LEAD IMPLANT DT: 20150206
MDC IDC MSMT BATTERY REMAINING LONGEVITY: 144 mo
MDC IDC MSMT BATTERY VOLTAGE: 2.79 V
MDC IDC MSMT LEADCHNL RA PACING THRESHOLD AMPLITUDE: 0.75 V
MDC IDC MSMT LEADCHNL RA PACING THRESHOLD PULSEWIDTH: 0.4 ms
MDC IDC MSMT LEADCHNL RV IMPEDANCE VALUE: 539 Ohm
MDC IDC MSMT LEADCHNL RV PACING THRESHOLD AMPLITUDE: 0.375 V
MDC IDC SET LEADCHNL RV PACING AMPLITUDE: 2.5 V
MDC IDC SET LEADCHNL RV SENSING SENSITIVITY: 4 mV
MDC IDC STAT BRADY AP VP PERCENT: 0 %
MDC IDC STAT BRADY AP VS PERCENT: 0 %
MDC IDC STAT BRADY AS VP PERCENT: 1 %

## 2016-06-20 ENCOUNTER — Encounter: Payer: Self-pay | Admitting: Cardiology

## 2016-06-23 ENCOUNTER — Other Ambulatory Visit: Payer: Self-pay | Admitting: Emergency Medicine

## 2016-07-23 ENCOUNTER — Telehealth: Payer: Self-pay | Admitting: Emergency Medicine

## 2016-07-23 NOTE — Telephone Encounter (Signed)
Called spoke with pt. She states that he father needs samples of the Spiriva HandiHaler. I explained to her that we do not receive any samples of the HandiHaler. She voiced understanding and had no further questions. Nothing further needed at this time.

## 2016-08-05 ENCOUNTER — Ambulatory Visit: Payer: Medicare Other | Admitting: Cardiovascular Disease

## 2016-08-13 DIAGNOSIS — H353121 Nonexudative age-related macular degeneration, left eye, early dry stage: Secondary | ICD-10-CM | POA: Diagnosis not present

## 2016-08-13 DIAGNOSIS — H353211 Exudative age-related macular degeneration, right eye, with active choroidal neovascularization: Secondary | ICD-10-CM | POA: Diagnosis not present

## 2016-08-13 DIAGNOSIS — Z961 Presence of intraocular lens: Secondary | ICD-10-CM | POA: Diagnosis not present

## 2016-08-22 ENCOUNTER — Telehealth: Payer: Self-pay | Admitting: *Deleted

## 2016-08-22 DIAGNOSIS — M1712 Unilateral primary osteoarthritis, left knee: Secondary | ICD-10-CM | POA: Diagnosis not present

## 2016-08-22 NOTE — Telephone Encounter (Signed)
eliquis 5 mg samples given

## 2016-08-26 ENCOUNTER — Other Ambulatory Visit: Payer: Self-pay | Admitting: Emergency Medicine

## 2016-09-05 DIAGNOSIS — H353211 Exudative age-related macular degeneration, right eye, with active choroidal neovascularization: Secondary | ICD-10-CM | POA: Diagnosis not present

## 2016-09-05 DIAGNOSIS — H353131 Nonexudative age-related macular degeneration, bilateral, early dry stage: Secondary | ICD-10-CM | POA: Diagnosis not present

## 2016-09-05 DIAGNOSIS — H35361 Drusen (degenerative) of macula, right eye: Secondary | ICD-10-CM | POA: Diagnosis not present

## 2016-09-10 ENCOUNTER — Encounter: Payer: Self-pay | Admitting: Cardiovascular Disease

## 2016-09-10 ENCOUNTER — Ambulatory Visit (INDEPENDENT_AMBULATORY_CARE_PROVIDER_SITE_OTHER): Payer: Medicare Other | Admitting: Cardiovascular Disease

## 2016-09-10 VITALS — BP 122/72 | HR 81 | Ht 68.0 in | Wt 187.0 lb

## 2016-09-10 VITALS — BP 132/76 | HR 97 | Ht 70.0 in | Wt 187.0 lb

## 2016-09-10 DIAGNOSIS — I2583 Coronary atherosclerosis due to lipid rich plaque: Secondary | ICD-10-CM

## 2016-09-10 DIAGNOSIS — I4892 Unspecified atrial flutter: Secondary | ICD-10-CM

## 2016-09-10 DIAGNOSIS — J449 Chronic obstructive pulmonary disease, unspecified: Secondary | ICD-10-CM

## 2016-09-10 DIAGNOSIS — E785 Hyperlipidemia, unspecified: Secondary | ICD-10-CM | POA: Diagnosis not present

## 2016-09-10 DIAGNOSIS — I4430 Unspecified atrioventricular block: Secondary | ICD-10-CM | POA: Diagnosis not present

## 2016-09-10 DIAGNOSIS — Z950 Presence of cardiac pacemaker: Secondary | ICD-10-CM

## 2016-09-10 DIAGNOSIS — I2510 Atherosclerotic heart disease of native coronary artery without angina pectoris: Secondary | ICD-10-CM | POA: Diagnosis not present

## 2016-09-10 DIAGNOSIS — Z7901 Long term (current) use of anticoagulants: Secondary | ICD-10-CM | POA: Diagnosis not present

## 2016-09-10 DIAGNOSIS — I951 Orthostatic hypotension: Secondary | ICD-10-CM | POA: Insufficient documentation

## 2016-09-10 HISTORY — DX: Orthostatic hypotension: I95.1

## 2016-09-10 MED ORDER — METOPROLOL TARTRATE 50 MG PO TABS: 75.0000 mg | ORAL_TABLET | Freq: Two times a day (BID) | ORAL | 3 refills | Status: DC

## 2016-09-10 NOTE — Patient Instructions (Addendum)
Medication Instructions:   INCREASE METOPROLOL TO 75 MG TWICE DAILY=  1 AND 1/2 OF THE 50 MG TABLETS TWICE DAILY  Your physician has requested that you have a lexiscan myoview. For further information please visit [email or web address]. Please follow instruction sheet, as given.   Follow-Up:  Your physician recommends that you schedule a follow-up appointment in: TBD  If you need a refill on your cardiac medications before your next appointment, please call your pharmacy.

## 2016-09-10 NOTE — Patient Instructions (Signed)
Dr Sallyanne Kuster recommends that you continue on your current medications as directed. Please refer to the Current Medication list given to you today.  Remote monitoring is used to monitor your Pacemaker of ICD from home. This monitoring reduces the number of office visits required to check your device to one time per year. It allows [place] to keep an eye on the functioning of your device to ensure it is working properly. You are scheduled for a device check from home on Tuesday, December 12th, 2017. You may send your transmission at any time that day. If you have a wireless device, the transmission will be sent automatically. After your physician reviews your transmission, you will receive a postcard with your next transmission date.  Dr Sallyanne Kuster recommends that you schedule a follow-up appointment in 12 months with a device check. You will receive a reminder letter in the mail two months in advance. If you don't receive a letter, please call our office to schedule the follow-up appointment.  If you need a refill on your cardiac medications before your next appointment, please call your pharmacy.

## 2016-09-10 NOTE — Progress Notes (Signed)
Patient ID: [name], male   DOB: Aug 20, 1929, 82 y.o.   MRN: [id number]    Cardiology Office Note    Date:  09/10/2016   ID:  [name], DOB [birth year], MRN [id number]  PCP:  Leonides Sake, MD  Cardiologist: Shelva Majestic, MD;  Sanda Klein, MD   Chief Complaint  Patient presents with  . Follow-up    PACE CHECK;     History of Present Illness:  [name] is a 82 y.o. 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  Just saw Dr. Claiborne Billings in the office earlier today.  He generally feels well. He participates in a senior bowling tournament and also plays "chair volleyball".  He denies angina pectoris. He is very hard of hearing. He has not had lower extremity edema. He denies bleeding or focal neurological deficits. He reports perfect compliance with anticoagulant medication.  Pacemaker interrogation shows normal device function. The device generator has approximately 12 years of estimated remaining longevity. There is 0.3% atrial pacing and 1% ventricular pacing. The overall burden of atrial mode switch has been >99%. He remains in on episode of lengthy persistent atrial flutter that began on February 1 (brief episodes of reported normal rhythm likely represent transient undersensing of the atrial waves). The average ventricular rate has been in the 80-90 range, occasionally over 100 bpm. We previously tried overdrive pacing unsuccessfully  Dr. Claiborne Billings just increased his dose of metoprolol to 75 mg twice daily. His orthostatic dizziness has been well managed with Midodrine 5 mg, 3 times daily.  Today's electrocardiogram shows atrial flutter with variable degrees of AV block, incomplete right bundle branch block and an old inferior wall myocardial infarction. The ventricular rate is 96 bpm.  QTC is 441 ms   Past Medical History:  Diagnosis Date  . Anemia of renal disease 12/05/2011  . Anemia, iron deficiency 12/05/2011  . Asthma   . CAS (cerebral atherosclerosis)    Carotid Dopplers, 04/09/2012 - Bilateral Bulb/Proximal ICAs-mild to moderate amount of fibrous plaque of fibrous soft plaque w/o evidence of a significant diameter reduction, dissection, or any other vascular abnormality  . COPD (chronic obstructive pulmonary disease) ([place])   . Coronary heart disease    2D Echo, 04/13/2013 - EF 55-60%, moderate regurg of the tricuspid valve  . Dysrhythmia 11/09/2013   SVT VS A FLUTTER   . Kidney stones   . MGUS (monoclonal gammopathy of unknown significance) 12/05/2011  . Nonspecific ST-T wave electrocardiographic changes    Lexiscan, 06/02/2012 - EKG negative for ischemia, compared to previous study-there is no significant change, normal myocardial perfusion study  . Shortness of breath   . Status post placement of implantable loop recorder 12/09/2013    Past Surgical History:  Procedure Laterality Date  . CARDIAC CATHETERIZATION  12/05/2005   Recommended CABG  . CARDIOVERSION N/A 11/09/2013   Procedure: CARDIOVERSION;  Surgeon: Pixie Casino, MD;  Location: [facility] ENDOSCOPY;  Service: Cardiovascular;  Laterality: N/A;  . CATARACT EXTRACTION    . CORONARY ARTERY BYPASS GRAFT  12/06/2005   x4, LIMA to LAD, vein to distal circumflex, sequential vein to intermediate and obtuse marginal vessel  . INNER EAR SURGERY    . KIDNEY STONE SURGERY    . LOOP RECORDER IMPLANT N/A 11/30/2013   Procedure: LOOP RECORDER IMPLANT;  Surgeon: Sanda Klein, MD;  Location: [facility]  CATH LAB;  Service: Cardiovascular;  Laterality: N/A;  . PERMANENT PACEMAKER INSERTION N/A 02/04/2014   Procedure: PERMANENT PACEMAKER INSERTION;  Surgeon: Sanda Klein, MD;  Location: [facility] CATH LAB;  Service: Cardiovascular;  Laterality: N/A;  . TEE WITHOUT CARDIOVERSION N/A 11/09/2013   Procedure: TRANSESOPHAGEAL ECHOCARDIOGRAM  (TEE);  Surgeon: Pixie Casino, MD;  Location: [facility] ENDOSCOPY;  Service: Cardiovascular;  Laterality: N/A;    Outpatient Medications Prior to Visit  Medication Sig Dispense Refill  . albuterol (PROVENTIL HFA;VENTOLIN HFA) 108 (90 BASE) MCG/ACT inhaler Inhale 2 puffs into the lungs every 4 (four) hours as needed for wheezing or shortness of breath. 1 Inhaler 6  . apixaban (ELIQUIS) 5 MG TABS tablet Take 1 tablet (5 mg total) by mouth every 12 (twelve) hours. 180 tablet 1  . atorvastatin (LIPITOR) 40 MG tablet TAKE 1 TABLET (40 MG TOTAL) BY MOUTH EVERY EVENING. 90 tablet 4  . diphenhydramine-acetaminophen (TYLENOL PM) 25-500 MG TABS Take 2 tablets by mouth at bedtime.     . ENSURE (ENSURE) Take 1 Can by mouth every morning.    . metoprolol (LOPRESSOR) 50 MG tablet Take 1.5 tablets (75 mg total) by mouth 2 (two) times daily. 270 tablet 3  . midodrine (PROAMATINE) 5 MG tablet TAKE 1 TABLET (5 MG TOTAL) BY MOUTH 3 (THREE) TIMES DAILY. 270 tablet 4  . Multiple Vitamins-Minerals ([employer] SPECTRAVITE SENIOR) TABS Take 1 tablet by mouth every morning.     . niacin (NIASPAN) 500 MG CR tablet TAKE 1 TABLET (500 MG TOTAL) BY MOUTH AT BEDTIME. 90 tablet 3  . Probiotic Product ([employer] SENIOR PROBIOTIC PO) Take 1 capsule by mouth at bedtime. Once daily    . SPIRIVA HANDIHALER 18 MCG inhalation capsule PLACE 1 CAPSULE (18 MCG TOTAL) INTO INHALER AND INHALE EVERY MORNING. 30 capsule 0  . SYMBICORT 80-4.5 MCG/ACT inhaler INHALE 2 PUFFS INTO THE LUNGS 2 (TWO) TIMES DAILY. 30.6 Inhaler 1  . Tamsulosin HCl (FLOMAX) 0.4 MG CAPS Take 0.4 mg by mouth at bedtime.     . metoprolol (LOPRESSOR) 50 MG tablet Take 1 tablet (50 mg total) by mouth 2 (two) times daily. 180 tablet 4   No facility-administered medications prior to visit.      Allergies:   Review of patient's allergies indicates no known allergies.   Social History   Social History  . Marital status: Married    Spouse name: N/A  . Number of children: N/A  .  Years of education: N/A   Occupational History  . retired long distance Administrator   . maintenence    Social History Main Topics  . Smoking status: Former Smoker    Packs/day: 1.00    Years: 47.00    Types: Cigarettes    Start date: 12/02/1951    Quit date: 12/30/1993  . Smokeless tobacco: Never Used     Comment: quit 20 years ago  . Alcohol use No  . Drug use: No  . Sexual activity: Not Asked   Other Topics Concern  . None   Social History Narrative  . None     Family History:  The patient's family history includes Emphysema in his daughter; Heart attack in his father and mother; Heart disease in his brother and mother; Tuberculosis in his father.   ROS:   Please see the history of present illness.    ROS All other systems reviewed and are negative.   PHYSICAL EXAM:   VS:  BP 122/72   Pulse 81  Ht 5\' 8"  (1.727 m)   Wt 187 lb (84.8 kg)   BMI 28.43 kg/m    GEN: Well nourished, well developed, in no acute distress  HEENT: normal  Neck: no JVD, carotid bruits, or masses Cardiac: irregular; no murmurs, rubs, or gallops,no edema  Respiratory:  clear to auscultation bilaterally, normal work of breathing GI: soft, nontender, nondistended, + BS MS: no deformity or atrophy  Skin: warm and dry, no rash Neuro:  Alert and Oriented x 3, Strength and sensation are intact Psych: euthymic mood, full affect  Wt Readings from Last 3 Encounters:  09/10/16 187 lb (84.8 kg)  09/10/16 187 lb (84.8 kg)  05/09/16 191 lb (86.6 kg)      Studies/Labs Reviewed:   EKG:  EKG is ordered today.  The ekg ordered today demonstrates atrial flutter with variable response, incomplete right bundle branch block, old inferior wall myocardial infarctions  Recent Labs: 05/09/2016: ALT 30; BUN 24.6; Creatinine 1.4; HGB 11.3; Platelets 132; Potassium 4.6; Sodium 142   Lipid Panel    Component Value Date/Time   CHOL 99 (L) 08/15/2015 1151   TRIG 143 08/15/2015 1151   HDL 30 (L) 08/15/2015  1151   CHOLHDL 3.3 08/15/2015 1151   VLDL 29 08/15/2015 1151   LDLCALC 40 08/15/2015 1151     ASSESSMENT:    1. Atrial flutter, unspecified type ([place])   2. Chronic anticoagulation   3. AVB (atrioventricular block)- high grade second degree   4. Pacemaker- MDT 02/04/14   5. Coronary artery disease involving native coronary artery of native heart without angina pectoris   6. Chronic obstructive pulmonary disease, unspecified COPD type ([place])   7. Orthostatic hypotension      PLAN:  In order of problems listed above:  1. Persistent atrial flutter: Rate control is Borderline adequate adequate. Attempts at overdrive pacing were unsuccessful. This suggests that this may be atypical, possibly left-sided atrial flutter. The patient and family prefer conservative management. Although rate control could be improved upon, we have to be wary of the risk of worsening orthostatic hypotension which has previously caused syncope. 2. Anticoagulation: Tolerating Eliquis without bleeding problems, this should be continued due to high embolic risk. CHADSVasc 3 (age 46, CAD). 3. Second degree AV block: Intermittent: very low percentage of ventricular pacing 4. PM: Normal device function. No device reprogramming was necessary today. Will perform a remote download in 3 months and office visit in 12 months 5. CAD s/p CABG:  Asymptomatic 6. COPD: This is the dominant cause of his exertional dyspnea 7. Orthostatic hypotension was probably the cause of his syncope about a year ago. No recurrences on current treatment, reminded him to avoid lying down after taking ProAmatine for several hours. Avoid sudden changes in position. Stay well hydrated. Watch for worsening symptoms on higher dose of beta blocker   Medication Adjustments/Labs and Tests Ordered: Current medicines are reviewed at length with the patient today.  Concerns regarding medicines are outlined above.  Medication changes, Labs and Tests ordered today  are listed in the Patient Instructions below. Patient Instructions  Dr Sallyanne Kuster recommends that you continue on your current medications as directed. Please refer to the Current Medication list given to you today.  Remote monitoring is used to monitor your Pacemaker of ICD from home. This monitoring reduces the number of office visits required to check your device to one time per year. It allows [place] to keep an eye on the functioning of your device to ensure it is working  properly. You are scheduled for a device check from home on Tuesday, December 12th, 2017. You may send your transmission at any time that day. If you have a wireless device, the transmission will be sent automatically. After your physician reviews your transmission, you will receive a postcard with your next transmission date.  Dr Sallyanne Kuster recommends that you schedule a follow-up appointment in 12 months with a device check. You will receive a reminder letter in the mail two months in advance. If you don't receive a letter, please call our office to schedule the follow-up appointment.  If you need a refill on your cardiac medications before your next appointment, please call your pharmacy.   Signed, Sanda Klein, MD  09/10/2016 3:56 PM    [address] [place], [place], [place]  [address] Phone: [phone]; Fax: [phone]

## 2016-09-12 DIAGNOSIS — H353211 Exudative age-related macular degeneration, right eye, with active choroidal neovascularization: Secondary | ICD-10-CM | POA: Diagnosis not present
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
## 2016-09-19 ENCOUNTER — Telehealth (HOSPITAL_COMMUNITY): Payer: Self-pay

## 2016-09-19 NOTE — Telephone Encounter (Signed)
Encounter complete. 

## 2016-09-24 ENCOUNTER — Ambulatory Visit (HOSPITAL_COMMUNITY)
Admission: RE | Admit: 2016-09-24 | Discharge: 2016-09-24 | Disposition: A | Discharge status: Home / Self Care | Payer: Medicare Other | Source: Ambulatory Visit | Attending: Cardiovascular Disease | Admitting: Cardiovascular Disease

## 2016-09-24 ENCOUNTER — Other Ambulatory Visit: Payer: Self-pay | Admitting: Emergency Medicine

## 2016-09-24 DIAGNOSIS — J449 Chronic obstructive pulmonary disease, unspecified: Secondary | ICD-10-CM | POA: Insufficient documentation

## 2016-09-24 DIAGNOSIS — Z87891 Personal history of nicotine dependence: Secondary | ICD-10-CM | POA: Diagnosis not present

## 2016-09-24 DIAGNOSIS — R0609 Other forms of dyspnea: Secondary | ICD-10-CM | POA: Diagnosis not present

## 2016-09-24 DIAGNOSIS — Z951 Presence of aortocoronary bypass graft: Secondary | ICD-10-CM | POA: Diagnosis not present

## 2016-09-24 DIAGNOSIS — I10 Essential (primary) hypertension: Secondary | ICD-10-CM | POA: Insufficient documentation

## 2016-09-24 DIAGNOSIS — R42 Dizziness and giddiness: Secondary | ICD-10-CM | POA: Insufficient documentation

## 2016-09-24 DIAGNOSIS — Z8249 Family history of ischemic heart disease and other diseases of the circulatory system: Secondary | ICD-10-CM | POA: Diagnosis not present

## 2016-09-24 DIAGNOSIS — I2510 Atherosclerotic heart disease of native coronary artery without angina pectoris: Secondary | ICD-10-CM | POA: Diagnosis not present

## 2016-09-24 LAB — MYOCARDIAL PERFUSION IMAGING
CHL CUP NUCLEAR SRS: 2
CHL CUP RESTING HR STRESS: 75 {beats}/min
CHL CUP STRESS STAGE 1 DBP: 80 mmHg
CHL CUP STRESS STAGE 1 SPEED: 0 mph
CHL CUP STRESS STAGE 3 GRADE: 0 %
CHL CUP STRESS STAGE 3 SPEED: 0 mph
CHL CUP STRESS STAGE 4 GRADE: 0 %
CHL CUP STRESS STAGE 4 HR: 78 {beats}/min
CHL CUP STRESS STAGE 4 SBP: 143 mmHg
CHL CUP STRESS STAGE 4 SPEED: 0 mph
CSEPEW: 1 METS
CSEPPBP: 144 mmHg
CSEPPHR: 86 {beats}/min
LV dias vol: 77 mL (ref 62–150)
LVSYSVOL: 44 mL
Percent of predicted max HR: 63 %
SDS: 0
SSS: 2
Stage 1 Grade: 0 %
Stage 1 HR: 77 {beats}/min
Stage 1 SBP: 146 mmHg
Stage 2 Grade: 0 %
Stage 2 HR: 78 {beats}/min
Stage 2 Speed: 0 mph
Stage 3 DBP: 70 mmHg
Stage 3 HR: 86 {beats}/min
Stage 3 SBP: 144 mmHg
Stage 4 DBP: 78 mmHg
TID: 1.07

## 2016-09-24 MED ORDER — REGADENOSON 0.4 MG/5ML IV SOLN
0.4000 mg | Freq: Once | INTRAVENOUS | Status: AC
  Administered 2016-09-24: 0.4 mg via INTRAVENOUS

## 2016-09-24 MED ORDER — TECHNETIUM TC 99M TETROFOSMIN IV KIT
10.1000 | PACK | Freq: Once | INTRAVENOUS | Status: AC | PRN
  Administered 2016-09-24: 10.1 via INTRAVENOUS
  Filled 2016-09-24: qty 10

## 2016-09-24 MED ORDER — TECHNETIUM TC 99M TETROFOSMIN IV KIT
32.1000 | PACK | Freq: Once | INTRAVENOUS | Status: AC | PRN
  Administered 2016-09-24: 32.1 via INTRAVENOUS
  Filled 2016-09-24: qty 32

## 2016-10-01 ENCOUNTER — Telehealth: Payer: Self-pay | Admitting: Cardiovascular Disease

## 2016-10-01 LAB — CUP PACEART INCLINIC DEVICE CHECK
Battery Voltage: 2.79 V
Brady Statistic AP VS Percent: 0 %
Brady Statistic AS VS Percent: 99 %
Date Time Interrogation Session: 20170912140843
Implantable Lead Implant Date: 20150206
Implantable Lead Implant Date: 20150206
Implantable Lead Location: 753859
Lead Channel Impedance Value: 476 Ohm
Lead Channel Impedance Value: 539 Ohm
Lead Channel Pacing Threshold Pulse Width: 0.4 ms
Lead Channel Setting Pacing Amplitude: 2 V
Lead Channel Setting Pacing Pulse Width: 0.4 ms
MDC IDC LEAD LOCATION: 753860
MDC IDC MSMT BATTERY IMPEDANCE: 135 Ohm
MDC IDC MSMT BATTERY REMAINING LONGEVITY: 145 mo
MDC IDC MSMT LEADCHNL RA PACING THRESHOLD AMPLITUDE: 0.75 V
MDC IDC MSMT LEADCHNL RA PACING THRESHOLD PULSEWIDTH: 0.4 ms
MDC IDC MSMT LEADCHNL RA SENSING INTR AMPL: 2.8 mV
MDC IDC MSMT LEADCHNL RV PACING THRESHOLD AMPLITUDE: 0.375 V
MDC IDC MSMT LEADCHNL RV SENSING INTR AMPL: 8 mV
MDC IDC SET LEADCHNL RV PACING AMPLITUDE: 2.5 V
MDC IDC SET LEADCHNL RV SENSING SENSITIVITY: 4 mV
MDC IDC STAT BRADY AP VP PERCENT: 0 %
MDC IDC STAT BRADY AS VP PERCENT: 1 %

## 2016-10-01 NOTE — Telephone Encounter (Signed)
New message    Pts dtr returning nurse call.

## 2016-10-01 NOTE — Telephone Encounter (Signed)
FORWARD  TO [facility] 

## 2016-10-03 NOTE — Telephone Encounter (Signed)
Leave message for pt to call back 

## 2016-10-17 DIAGNOSIS — H353211 Exudative age-related macular degeneration, right eye, with active choroidal neovascularization: Secondary | ICD-10-CM | POA: Diagnosis not present

## 2016-10-22 ENCOUNTER — Other Ambulatory Visit: Payer: Self-pay | Admitting: Emergency Medicine

## 2016-10-23 ENCOUNTER — Other Ambulatory Visit: Payer: Self-pay | Admitting: *Deleted

## 2016-10-23 ENCOUNTER — Telehealth: Payer: Self-pay | Admitting: *Deleted

## 2016-10-23 DIAGNOSIS — I2510 Atherosclerotic heart disease of native coronary artery without angina pectoris: Secondary | ICD-10-CM

## 2016-10-23 NOTE — Telephone Encounter (Signed)
Left results and recommendations on home VM.

## 2016-11-07 ENCOUNTER — Ambulatory Visit (HOSPITAL_BASED_OUTPATIENT_CLINIC_OR_DEPARTMENT_OTHER): Payer: Medicare Other

## 2016-11-07 ENCOUNTER — Other Ambulatory Visit (HOSPITAL_BASED_OUTPATIENT_CLINIC_OR_DEPARTMENT_OTHER): Payer: Medicare Other

## 2016-11-07 ENCOUNTER — Ambulatory Visit (HOSPITAL_BASED_OUTPATIENT_CLINIC_OR_DEPARTMENT_OTHER): Payer: Medicare Other | Admitting: Family

## 2016-11-07 VITALS — BP 140/61 | HR 114 | Temp 97.9°F | Resp 20 | Wt 192.8 lb

## 2016-11-07 DIAGNOSIS — D509 Iron deficiency anemia, unspecified: Secondary | ICD-10-CM

## 2016-11-07 DIAGNOSIS — D631 Anemia in chronic kidney disease: Secondary | ICD-10-CM

## 2016-11-07 DIAGNOSIS — D508 Other iron deficiency anemias: Secondary | ICD-10-CM

## 2016-11-07 DIAGNOSIS — D472 Monoclonal gammopathy: Secondary | ICD-10-CM | POA: Diagnosis not present

## 2016-11-07 DIAGNOSIS — N189 Chronic kidney disease, unspecified: Secondary | ICD-10-CM

## 2016-11-07 DIAGNOSIS — Z23 Encounter for immunization: Secondary | ICD-10-CM

## 2016-11-07 LAB — COMPREHENSIVE METABOLIC PANEL
ALBUMIN: 3.7 g/dL (ref 3.5–5.0)
ALK PHOS: 92 U/L (ref 40–150)
ALT: 30 U/L (ref 0–55)
ANION GAP: 9 meq/L (ref 3–11)
AST: 27 U/L (ref 5–34)
BUN: 19.4 mg/dL (ref 7.0–26.0)
CALCIUM: 9.6 mg/dL (ref 8.4–10.4)
CHLORIDE: 108 meq/L (ref 98–109)
CO2: 25 mEq/L (ref 22–29)
Creatinine: 1.2 mg/dL (ref 0.7–1.3)
EGFR: 53 mL/min/{1.73_m2} — ABNORMAL LOW (ref >90)
Glucose: 114 mg/dl (ref 70–140)
POTASSIUM: 4.8 meq/L (ref 3.5–5.1)
Sodium: 142 mEq/L (ref 136–145)
Total Bilirubin: 1.3 mg/dL — ABNORMAL HIGH (ref 0.20–1.20)
Total Protein: 7.4 g/dL (ref 6.4–8.3)

## 2016-11-07 LAB — IRON AND TIBC
%SAT: 22 % (ref 20–55)
IRON: 59 ug/dL (ref 42–163)
TIBC: 273 ug/dL (ref 202–409)
UIBC: 214 ug/dL (ref 117–376)

## 2016-11-07 LAB — CBC WITH DIFFERENTIAL (CANCER CENTER ONLY)
BASO#: 0.1 10*3/uL (ref 0.0–0.2)
BASO%: 1.3 % (ref 0.0–2.0)
EOS ABS: 0.1 10*3/uL (ref 0.0–0.5)
EOS%: 2.5 % (ref 0.0–7.0)
HEMATOCRIT: 34.7 % — AB (ref 38.7–49.9)
HEMOGLOBIN: 11.3 g/dL — AB (ref 13.0–17.1)
LYMPH#: 1.1 10*3/uL (ref 0.9–3.3)
LYMPH%: 23.5 % (ref 14.0–48.0)
MCH: 33.3 pg (ref 28.0–33.4)
MCHC: 32.6 g/dL (ref 32.0–35.9)
MCV: 102 fL — AB (ref 82–98)
MONO#: 0.5 10*3/uL (ref 0.1–0.9)
MONO%: 9.5 % (ref 0.0–13.0)
NEUT%: 63.2 % (ref 40.0–80.0)
NEUTROS ABS: 3 10*3/uL (ref 1.5–6.5)
Platelets: 146 10*3/uL (ref 145–400)
RBC: 3.39 10*6/uL — ABNORMAL LOW (ref 4.20–5.70)
RDW: 12.9 % (ref 11.1–15.7)
WBC: 4.8 10*3/uL (ref 4.0–10.0)

## 2016-11-07 LAB — RETICULOCYTES: Reticulocyte Count: 1.2 % (ref 0.6–2.6)

## 2016-11-07 LAB — FERRITIN: Ferritin: 445 ng/ml — ABNORMAL HIGH (ref 22–316)

## 2016-11-07 MED ORDER — INFLUENZA VAC SPLIT QUAD 0.5 ML IM SUSY
0.5000 mL | PREFILLED_SYRINGE | Freq: Once | INTRAMUSCULAR | Status: AC
  Administered 2016-11-07: 0.5 mL via INTRAMUSCULAR
  Filled 2016-11-07: qty 0.5

## 2016-11-07 NOTE — Patient Instructions (Signed)

## 2016-11-07 NOTE — Progress Notes (Signed)
Hematology and Oncology Follow Up Visit  [name] DJ:[id number] [birth year] 82 y.o. 11/07/2016   Principle Diagnosis:  1. 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 Anemia secondary to chronic renal insufficiency 3. Intermittent iron - deficiency anemia  Current Therapy:   IV iron as indicated - last in May 2016    Interim History:  [name] is here today with his daughter for a follow-up. He is doing well and has had no new issues.  In May, his M-spike was 0.5 g/dL, IgG level was 1,125 mg/dL and lamba light chain was 12.79 mg/dL.  His SOB is with exertion due to asthma and emphysema is the same. He has a follow-up with cardiology next week for a routine ECHO. He had some tachycardia today which he stated was due to walking in from the car and also anxiety about his appointment. He gets "white coat syndrome" with any doctors visit.   No fever, chills, n/v, cough, rash, dizziness, chest pain, palpitations, abdominal pain or changes in bowel or bladder habits.   No swelling, tenderness or tingling in his extremities. He does have arthritis in his knees (worse in the left) that bothers him and some numbness in the bottom of his left foot that comes and goes. He uses a cane to ambulate. He has had no falls or syncope.  He has a good appetite and is staying well hydrated. His weight is stable.  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   Medications:    Medication List       Accurate as of 11/07/16 11:25 AM. Always use your most recent med list.          albuterol 108 (90 Base) MCG/ACT inhaler Commonly known as:  PROVENTIL HFA;VENTOLIN HFA Inhale 2 puffs into the lungs every 4 (four) hours as needed for wheezing or shortness of breath.   apixaban 5 MG Tabs tablet Commonly known as:  ELIQUIS Take 1 tablet (5 mg total) by mouth every 12 (twelve) hours.   atorvastatin 40 MG tablet Commonly known  as:  LIPITOR TAKE 1 TABLET (40 MG TOTAL) BY MOUTH EVERY EVENING.   CVS SENIOR PROBIOTIC PO Take 1 capsule by mouth at bedtime. Once daily   CVS SPECTRAVITE SENIOR Tabs Take 1 tablet by mouth every morning.   diphenhydramine-acetaminophen 25-500 MG Tabs tablet Commonly known as:  TYLENOL PM Take 2 tablets by mouth at bedtime.   ENSURE Take 1 Can by mouth every morning.   metoprolol 50 MG tablet Commonly known as:  LOPRESSOR Take 1.5 tablets (75 mg total) by mouth 2 (two) times daily.   midodrine 5 MG tablet Commonly known as:  PROAMATINE TAKE 1 TABLET (5 MG TOTAL) BY MOUTH 3 (THREE) TIMES DAILY.   niacin 500 MG CR tablet Commonly known as:  NIASPAN TAKE 1 TABLET (500 MG TOTAL) BY MOUTH AT BEDTIME.   SPIRIVA HANDIHALER 18 MCG inhalation capsule Generic drug:  tiotropium PLACE 1 CAPSULE (18 MCG TOTAL) INTO INHALER AND INHALE EVERY MORNING.   SYMBICORT 80-4.5 MCG/ACT inhaler Generic drug:  budesonide-formoterol INHALE 2 PUFFS INTO THE LUNGS 2 (TWO) TIMES DAILY.   tamsulosin 0.4 MG Caps capsule Commonly known as:  FLOMAX Take 0.4 mg by mouth at bedtime.       Allergies: No Known Allergies  Past Medical History, Surgical history, Social history, and Family History were reviewed and updated.  Review of Systems: All other 10 point review  of systems is negative.   Physical Exam:  vitals were not taken for this visit.  Wt Readings from Last 3 Encounters:  09/24/16 187 lb (84.8 kg)  09/10/16 187 lb (84.8 kg)  09/10/16 187 lb (84.8 kg)    Ocular: Sclerae unicteric, pupils equal, round and reactive to light Ear-nose-throat: Oropharynx clear, dentition fair Lymphatic: No cervical supraclavicular or axillary adenopathy Lungs no rales or rhonchi, good excursion bilaterally Heart regular rate and rhythm, no murmur appreciated Abd soft, nontender, positive bowel sounds, no liver or spleen tip palpated on exam, no fluid wave MSK no focal spinal tenderness, no joint  edema Neuro: non-focal, well-oriented, appropriate affect Breasts: Deferred  Lab Results  Component Value Date   WBC 4.8 11/07/2016   HGB 11.3 (L) 11/07/2016   HCT 34.7 (L) 11/07/2016   MCV 102 (H) 11/07/2016   PLT 146 11/07/2016   Lab Results  Component Value Date   FERRITIN 325 (H) 05/09/2016   IRON 88 05/09/2016   TIBC 255 05/09/2016   UIBC 167 05/09/2016   IRONPCTSAT 35 05/09/2016   Lab Results  Component Value Date   RETICCTPCT 0.8 11/09/2015   RBC 3.39 (L) 11/07/2016   RETICCTABS 33.2 11/09/2015   Lab Results  Component Value Date   KPAFRELGTCHN 4.45 (H) 11/09/2015   LAMBDASER 9.33 (H) 11/09/2015   KAPLAMBRATIO 0.32 05/09/2016   Lab Results  Component Value Date   IGGSERUM 1,125 05/09/2016   IGA 181 11/09/2015   IGMSERUM 300 (H) 05/09/2016   Lab Results  Component Value Date   TOTALPROTELP 7.1 11/09/2015   ALBUMINELP 3.8 11/09/2015   A1GS 0.3 11/09/2015   A2GS 0.8 11/09/2015   BETS 0.4 11/09/2015   BETA2SER 0.4 11/09/2015   GAMS 1.3 11/09/2015   MSPIKE 0.5 (H) 05/09/2016   SPEI * 11/09/2015     Chemistry      Component Value Date/Time   NA 142 05/09/2016 0935   K 4.6 05/09/2016 0935   CL 107 08/15/2015 1149   CO2 27 05/09/2016 0935   BUN 24.6 05/09/2016 0935   CREATININE 1.4 (H) 05/09/2016 0935      Component Value Date/Time   CALCIUM 9.6 05/09/2016 0935   ALKPHOS 93 05/09/2016 0935   AST 26 05/09/2016 0935   ALT 30 05/09/2016 0935   BILITOT 1.44 (H) 05/09/2016 0935     Impression and Plan: [name] is an 82 yo white male with multifactorial anemia (iron deficiency and chronic renal disease). 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 We continues to follow this and labs are pending.  He has no complaints at this time. Hgb is stable at 11.3 with an MCV of 102.  We will see what his iron studies show and bring him in next week for an infusion if needed.  We will  go ahead and plan to see him back in 6 months for labs and follow-up.  Both he and his daughter know to contact our office with any questions or concerns. We can certainly see him sooner if need be.   Eliezer Bottom, NP 11/9/201711:25 AM

## 2016-11-08 ENCOUNTER — Telehealth: Payer: Self-pay | Admitting: *Deleted

## 2016-11-08 LAB — IGG, IGA, IGM
IGA/IMMUNOGLOBULIN A, SERUM: 145 mg/dL (ref 61–437)
IGM (IMMUNOGLOBIN M), SRM: 306 mg/dL — AB (ref 15–143)
IgG, Qn, Serum: 979 mg/dL (ref 700–1600)

## 2016-11-08 LAB — KAPPA/LAMBDA LIGHT CHAINS
Ig Kappa Free Light Chain: 31.3 mg/L — ABNORMAL HIGH (ref 3.3–19.4)
Ig Lambda Free Light Chain: 84.7 mg/L — ABNORMAL HIGH (ref 5.7–26.3)
Kappa/Lambda FluidC Ratio: 0.37 (ref 0.26–1.65)

## 2016-11-08 LAB — ERYTHROPOIETIN: ERYTHROPOIETIN: 40 m[IU]/mL — AB (ref 2.6–18.5)

## 2016-11-08 NOTE — Telephone Encounter (Addendum)
Patient's daughter aware of results  ----- Message from Eliezer Bottom, NP sent at 11/07/2016  4:42 PM EST ----- Regarding: Iron Iron studies still look good. No infusion needed at this time. Thank you!  Sarah  ----- Message ----- From: Interface, Lab In Three Zero One Sent: 11/07/2016  11:03 AM To: Eliezer Bottom, NP

## 2016-11-11 LAB — PROTEIN ELECTROPHORESIS, SERUM, WITH REFLEX
A/G Ratio: 1.1 (ref 0.7–1.7)
ALPHA 1: 0.2 g/dL (ref 0.0–0.4)
ALPHA 2: 0.8 g/dL (ref 0.4–1.0)
Albumin: 3.5 g/dL (ref 2.9–4.4)
BETA: 1 g/dL (ref 0.7–1.3)
GLOBULIN, TOTAL: 3.3 g/dL (ref 2.2–3.9)
Gamma Globulin: 1.3 g/dL (ref 0.4–1.8)
INTERPRETATION(SEE BELOW): 0
M-SPIKE, %: 0.6 g/dL — AB
Total Protein: 6.8 g/dL (ref 6.0–8.5)

## 2016-11-14 ENCOUNTER — Other Ambulatory Visit (HOSPITAL_COMMUNITY): Payer: Medicare Other

## 2016-11-15 ENCOUNTER — Other Ambulatory Visit: Payer: Self-pay | Admitting: Emergency Medicine

## 2016-11-19 DIAGNOSIS — H353211 Exudative age-related macular degeneration, right eye, with active choroidal neovascularization: Secondary | ICD-10-CM | POA: Diagnosis not present

## 2016-11-22 ENCOUNTER — Other Ambulatory Visit: Payer: Self-pay

## 2016-11-22 ENCOUNTER — Telehealth: Payer: Self-pay | Admitting: Emergency Medicine

## 2016-11-22 MED ORDER — TIOTROPIUM BROMIDE MONOHYDRATE 18 MCG IN CAPS: ORAL_CAPSULE | RESPIRATORY_TRACT | 2 refills | Status: DC

## 2016-11-22 NOTE — Telephone Encounter (Signed)
Spoke with daughter and informed her that I will send in refills to get him to his appointment that is in Jan. But he has to keep his appt for further refills. She verbalized understanding. Refills were sent in. Nothing further is needed at this time.

## 2016-11-26 ENCOUNTER — Ambulatory Visit: Payer: Medicare Other | Admitting: Cardiology

## 2016-12-01 ENCOUNTER — Encounter (HOSPITAL_COMMUNITY): Payer: Self-pay | Admitting: *Deleted

## 2016-12-01 ENCOUNTER — Emergency Department (HOSPITAL_COMMUNITY): Payer: Medicare Other

## 2016-12-01 ENCOUNTER — Inpatient Hospital Stay (HOSPITAL_COMMUNITY)
Admission: EM | Admit: 2016-12-01 | Discharge: 2016-12-09 | DRG: 871 | Disposition: A | Discharge status: Home Health | Payer: Medicare Other | Attending: Internal Medicine | Admitting: Internal Medicine

## 2016-12-01 DIAGNOSIS — R0602 Shortness of breath: Secondary | ICD-10-CM | POA: Diagnosis not present

## 2016-12-01 DIAGNOSIS — R748 Abnormal levels of other serum enzymes: Secondary | ICD-10-CM

## 2016-12-01 DIAGNOSIS — K56609 Unspecified intestinal obstruction, unspecified as to partial versus complete obstruction: Secondary | ICD-10-CM | POA: Diagnosis present

## 2016-12-01 DIAGNOSIS — R1310 Dysphagia, unspecified: Secondary | ICD-10-CM | POA: Diagnosis not present

## 2016-12-01 DIAGNOSIS — E872 Acidosis, unspecified: Secondary | ICD-10-CM | POA: Diagnosis present

## 2016-12-01 DIAGNOSIS — K7200 Acute and subacute hepatic failure without coma: Secondary | ICD-10-CM | POA: Diagnosis present

## 2016-12-01 DIAGNOSIS — D638 Anemia in other chronic diseases classified elsewhere: Secondary | ICD-10-CM | POA: Diagnosis present

## 2016-12-01 DIAGNOSIS — R7881 Bacteremia: Secondary | ICD-10-CM | POA: Diagnosis not present

## 2016-12-01 DIAGNOSIS — I471 Supraventricular tachycardia: Secondary | ICD-10-CM | POA: Diagnosis present

## 2016-12-01 DIAGNOSIS — J9601 Acute respiratory failure with hypoxia: Secondary | ICD-10-CM | POA: Diagnosis present

## 2016-12-01 DIAGNOSIS — Z7901 Long term (current) use of anticoagulants: Secondary | ICD-10-CM

## 2016-12-01 DIAGNOSIS — N12 Tubulo-interstitial nephritis, not specified as acute or chronic: Secondary | ICD-10-CM | POA: Diagnosis not present

## 2016-12-01 DIAGNOSIS — N179 Acute kidney failure, unspecified: Secondary | ICD-10-CM | POA: Diagnosis not present

## 2016-12-01 DIAGNOSIS — I2510 Atherosclerotic heart disease of native coronary artery without angina pectoris: Secondary | ICD-10-CM | POA: Diagnosis present

## 2016-12-01 DIAGNOSIS — Z8249 Family history of ischemic heart disease and other diseases of the circulatory system: Secondary | ICD-10-CM

## 2016-12-01 DIAGNOSIS — N183 Chronic kidney disease, stage 3 (moderate): Secondary | ICD-10-CM | POA: Diagnosis not present

## 2016-12-01 DIAGNOSIS — N170 Acute kidney failure with tubular necrosis: Secondary | ICD-10-CM | POA: Diagnosis present

## 2016-12-01 DIAGNOSIS — D703 Neutropenia due to infection: Secondary | ICD-10-CM | POA: Diagnosis present

## 2016-12-01 DIAGNOSIS — K8020 Calculus of gallbladder without cholecystitis without obstruction: Secondary | ICD-10-CM | POA: Diagnosis present

## 2016-12-01 DIAGNOSIS — A419 Sepsis, unspecified organism: Secondary | ICD-10-CM | POA: Diagnosis not present

## 2016-12-01 DIAGNOSIS — E785 Hyperlipidemia, unspecified: Secondary | ICD-10-CM | POA: Diagnosis present

## 2016-12-01 DIAGNOSIS — Z951 Presence of aortocoronary bypass graft: Secondary | ICD-10-CM

## 2016-12-01 DIAGNOSIS — I672 Cerebral atherosclerosis: Secondary | ICD-10-CM | POA: Diagnosis present

## 2016-12-01 DIAGNOSIS — N3000 Acute cystitis without hematuria: Secondary | ICD-10-CM

## 2016-12-01 DIAGNOSIS — N390 Urinary tract infection, site not specified: Secondary | ICD-10-CM | POA: Diagnosis not present

## 2016-12-01 DIAGNOSIS — K5900 Constipation, unspecified: Secondary | ICD-10-CM | POA: Diagnosis not present

## 2016-12-01 DIAGNOSIS — I4892 Unspecified atrial flutter: Secondary | ICD-10-CM | POA: Diagnosis present

## 2016-12-01 DIAGNOSIS — E7849 Other hyperlipidemia: Secondary | ICD-10-CM

## 2016-12-01 DIAGNOSIS — I951 Orthostatic hypotension: Secondary | ICD-10-CM | POA: Diagnosis present

## 2016-12-01 DIAGNOSIS — R569 Unspecified convulsions: Secondary | ICD-10-CM | POA: Diagnosis not present

## 2016-12-01 DIAGNOSIS — J431 Panlobular emphysema: Secondary | ICD-10-CM | POA: Diagnosis not present

## 2016-12-01 DIAGNOSIS — D649 Anemia, unspecified: Secondary | ICD-10-CM | POA: Diagnosis not present

## 2016-12-01 DIAGNOSIS — N189 Chronic kidney disease, unspecified: Secondary | ICD-10-CM | POA: Diagnosis present

## 2016-12-01 DIAGNOSIS — B9689 Other specified bacterial agents as the cause of diseases classified elsewhere: Secondary | ICD-10-CM

## 2016-12-01 DIAGNOSIS — Z87891 Personal history of nicotine dependence: Secondary | ICD-10-CM

## 2016-12-01 DIAGNOSIS — N400 Enlarged prostate without lower urinary tract symptoms: Secondary | ICD-10-CM | POA: Diagnosis present

## 2016-12-01 DIAGNOSIS — R188 Other ascites: Secondary | ICD-10-CM | POA: Diagnosis not present

## 2016-12-01 DIAGNOSIS — Z79899 Other long term (current) drug therapy: Secondary | ICD-10-CM

## 2016-12-01 DIAGNOSIS — A4159 Other Gram-negative sepsis: Secondary | ICD-10-CM | POA: Diagnosis not present

## 2016-12-01 DIAGNOSIS — R5081 Fever presenting with conditions classified elsewhere: Secondary | ICD-10-CM | POA: Diagnosis present

## 2016-12-01 DIAGNOSIS — D631 Anemia in chronic kidney disease: Secondary | ICD-10-CM

## 2016-12-01 DIAGNOSIS — N200 Calculus of kidney: Secondary | ICD-10-CM | POA: Diagnosis not present

## 2016-12-01 DIAGNOSIS — D472 Monoclonal gammopathy: Secondary | ICD-10-CM | POA: Diagnosis not present

## 2016-12-01 DIAGNOSIS — N289 Disorder of kidney and ureter, unspecified: Secondary | ICD-10-CM

## 2016-12-01 DIAGNOSIS — D500 Iron deficiency anemia secondary to blood loss (chronic): Secondary | ICD-10-CM

## 2016-12-01 DIAGNOSIS — D509 Iron deficiency anemia, unspecified: Secondary | ICD-10-CM | POA: Diagnosis present

## 2016-12-01 DIAGNOSIS — D61818 Other pancytopenia: Secondary | ICD-10-CM | POA: Diagnosis present

## 2016-12-01 DIAGNOSIS — R2689 Other abnormalities of gait and mobility: Secondary | ICD-10-CM

## 2016-12-01 DIAGNOSIS — Z79891 Long term (current) use of opiate analgesic: Secondary | ICD-10-CM

## 2016-12-01 DIAGNOSIS — R7989 Other specified abnormal findings of blood chemistry: Secondary | ICD-10-CM | POA: Diagnosis not present

## 2016-12-01 LAB — URINALYSIS, ROUTINE W REFLEX MICROSCOPIC
BILIRUBIN URINE: NEGATIVE
Glucose, UA: NEGATIVE mg/dL
KETONES UR: NEGATIVE mg/dL
NITRITE: POSITIVE — AB
PROTEIN: 100 mg/dL — AB
Specific Gravity, Urine: 1.024 (ref 1.005–1.030)
pH: 5.5 (ref 5.0–8.0)

## 2016-12-01 LAB — CBC WITH DIFFERENTIAL/PLATELET
BASOS ABS: 0 10*3/uL (ref 0.0–0.1)
BASOS PCT: 0 %
Basophils Absolute: 0 10*3/uL (ref 0.0–0.1)
Basophils Relative: 0 %
EOS ABS: 0 10*3/uL (ref 0.0–0.7)
EOS ABS: 0 10*3/uL (ref 0.0–0.7)
EOS PCT: 0 %
Eosinophils Relative: 1 %
HCT: 29.8 % — ABNORMAL LOW (ref 39.0–52.0)
HEMATOCRIT: 30.9 % — AB (ref 39.0–52.0)
Hemoglobin: 9.8 g/dL — ABNORMAL LOW (ref 13.0–17.0)
Hemoglobin: 9.6 g/dL — ABNORMAL LOW (ref 13.0–17.0)
LYMPHS ABS: 0.1 10*3/uL — AB (ref 0.7–4.0)
LYMPHS ABS: 0.1 10*3/uL — AB (ref 0.7–4.0)
Lymphocytes Relative: 15 %
Lymphocytes Relative: 2 %
MCH: 31 pg (ref 26.0–34.0)
MCH: 32 pg (ref 26.0–34.0)
MCHC: 31.7 g/dL (ref 30.0–36.0)
MCHC: 32.2 g/dL (ref 30.0–36.0)
MCV: 97.8 fL (ref 78.0–100.0)
MCV: 99.3 fL (ref 78.0–100.0)
MONO ABS: 0 10*3/uL — AB (ref 0.1–1.0)
MONOS PCT: 1 %
MONOS PCT: 1 %
Monocytes Absolute: 0.1 10*3/uL (ref 0.1–1.0)
NEUTROS PCT: 97 %
Neutro Abs: 0.7 10*3/uL — ABNORMAL LOW (ref 1.7–7.7)
Neutro Abs: 5 10*3/uL (ref 1.7–7.7)
Neutrophils Relative %: 83 %
PLATELETS: 115 10*3/uL — AB (ref 150–400)
PLATELETS: 97 10*3/uL — AB (ref 150–400)
RBC: 3.16 MIL/uL — AB (ref 4.22–5.81)
RBC: 3 MIL/uL — ABNORMAL LOW (ref 4.22–5.81)
RDW: 13.2 % (ref 11.5–15.5)
RDW: 13.8 % (ref 11.5–15.5)
WBC: 0.8 10*3/uL — AB (ref 4.0–10.5)
WBC: 5.1 10*3/uL (ref 4.0–10.5)

## 2016-12-01 LAB — COMPREHENSIVE METABOLIC PANEL
ALBUMIN: 2.6 g/dL — AB (ref 3.5–5.0)
ALT: 305 U/L — ABNORMAL HIGH (ref 17–63)
ALT: 310 U/L — ABNORMAL HIGH (ref 17–63)
AST: 370 U/L — AB (ref 15–41)
AST: 368 U/L — ABNORMAL HIGH (ref 15–41)
Albumin: 3.1 g/dL — ABNORMAL LOW (ref 3.5–5.0)
Alkaline Phosphatase: 201 U/L — ABNORMAL HIGH (ref 38–126)
Alkaline Phosphatase: 207 U/L — ABNORMAL HIGH (ref 38–126)
Anion gap: 10 (ref 5–15)
Anion gap: 11 (ref 5–15)
BILIRUBIN TOTAL: 1.8 mg/dL — AB (ref 0.3–1.2)
BUN: 35 mg/dL — AB (ref 6–20)
BUN: 32 mg/dL — ABNORMAL HIGH (ref 6–20)
CALCIUM: 9 mg/dL (ref 8.9–10.3)
CALCIUM: 8.2 mg/dL — AB (ref 8.9–10.3)
CHLORIDE: 107 mmol/L (ref 101–111)
CO2: 23 mmol/L (ref 22–32)
CO2: 20 mmol/L — AB (ref 22–32)
Chloride: 106 mmol/L (ref 101–111)
Creatinine, Ser: 1.64 mg/dL — ABNORMAL HIGH (ref 0.61–1.24)
Creatinine, Ser: 1.71 mg/dL — ABNORMAL HIGH (ref 0.61–1.24)
GFR calc Af Amer: 42 mL/min — ABNORMAL LOW (ref >60)
GFR calc non Af Amer: 35 mL/min — ABNORMAL LOW (ref >60)
GFR, EST AFRICAN AMERICAN: 40 mL/min — AB (ref >60)
GFR, EST NON AFRICAN AMERICAN: 37 mL/min — AB (ref >60)
GLUCOSE: 135 mg/dL — AB (ref 65–99)
Glucose, Bld: 120 mg/dL — ABNORMAL HIGH (ref 65–99)
POTASSIUM: 4.2 mmol/L (ref 3.5–5.1)
POTASSIUM: 3.4 mmol/L — AB (ref 3.5–5.1)
SODIUM: 138 mmol/L (ref 135–145)
Sodium: 139 mmol/L (ref 135–145)
TOTAL PROTEIN: 6.9 g/dL (ref 6.5–8.1)
Total Bilirubin: 1.9 mg/dL — ABNORMAL HIGH (ref 0.3–1.2)
Total Protein: 5.9 g/dL — ABNORMAL LOW (ref 6.5–8.1)

## 2016-12-01 LAB — FERRITIN: Ferritin: 3420 ng/mL — ABNORMAL HIGH (ref 24–336)

## 2016-12-01 LAB — LACTIC ACID, PLASMA
LACTIC ACID, VENOUS: 2.6 mmol/L — AB (ref 0.5–1.9)
Lactic Acid, Venous: 2.8 mmol/L (ref 0.5–1.9)

## 2016-12-01 LAB — PROTIME-INR
INR: 1.86
Prothrombin Time: 21.7 seconds — ABNORMAL HIGH (ref 11.4–15.2)

## 2016-12-01 LAB — I-STAT CG4 LACTIC ACID, ED
LACTIC ACID, VENOUS: 3.09 mmol/L — AB (ref 0.5–1.9)
Lactic Acid, Venous: 3.09 mmol/L (ref 0.5–1.9)

## 2016-12-01 LAB — APTT: APTT: 41 s — AB (ref 24–36)

## 2016-12-01 LAB — IRON AND TIBC
Iron: 33 ug/dL — ABNORMAL LOW (ref 45–182)
SATURATION RATIOS: 15 % — AB (ref 17.9–39.5)
TIBC: 225 ug/dL — ABNORMAL LOW (ref 250–450)
UIBC: 192 ug/dL

## 2016-12-01 LAB — URINE MICROSCOPIC-ADD ON

## 2016-12-01 LAB — PROCALCITONIN: PROCALCITONIN: 41.79 ng/mL

## 2016-12-01 MED ORDER — ACETAMINOPHEN 650 MG RE SUPP: 650.0000 mg | Freq: Four times a day (QID) | RECTAL | Status: DC | PRN

## 2016-12-01 MED ORDER — MOMETASONE FURO-FORMOTEROL FUM 100-5 MCG/ACT IN AERO
2.0000 | INHALATION_SPRAY | Freq: Two times a day (BID) | RESPIRATORY_TRACT | Status: DC
  Administered 2016-12-02 – 2016-12-09 (×15): 2 via RESPIRATORY_TRACT
  Filled 2016-12-01: qty 8.8

## 2016-12-01 MED ORDER — VANCOMYCIN HCL IN DEXTROSE 1-5 GM/200ML-% IV SOLN
1000.0000 mg | Freq: Once | INTRAVENOUS | Status: AC
  Administered 2016-12-01: 1000 mg via INTRAVENOUS
  Filled 2016-12-01: qty 200

## 2016-12-01 MED ORDER — ACETAMINOPHEN 500 MG PO TABS
1000.0000 mg | ORAL_TABLET | Freq: Once | ORAL | Status: AC
  Administered 2016-12-01: 1000 mg via ORAL
  Filled 2016-12-01: qty 2

## 2016-12-01 MED ORDER — SODIUM CHLORIDE 0.9 % IV BOLUS (SEPSIS)
1000.0000 mL | Freq: Once | INTRAVENOUS | Status: AC
  Administered 2016-12-01 (×2): 1000 mL via INTRAVENOUS

## 2016-12-01 MED ORDER — BISACODYL 10 MG RE SUPP
10.0000 mg | Freq: Every day | RECTAL | Status: DC | PRN
  Administered 2016-12-02: 10 mg via RECTAL
  Filled 2016-12-01: qty 1

## 2016-12-01 MED ORDER — TAMSULOSIN HCL 0.4 MG PO CAPS
0.4000 mg | ORAL_CAPSULE | Freq: Every day | ORAL | Status: DC
  Administered 2016-12-02 – 2016-12-08 (×7): 0.4 mg via ORAL
  Filled 2016-12-01 (×8): qty 1

## 2016-12-01 MED ORDER — ONDANSETRON HCL 4 MG PO TABS: 4.0000 mg | ORAL_TABLET | Freq: Four times a day (QID) | ORAL | Status: DC | PRN

## 2016-12-01 MED ORDER — HYDROCODONE-ACETAMINOPHEN 5-325 MG PO TABS: 1.0000 | ORAL_TABLET | ORAL | Status: DC | PRN

## 2016-12-01 MED ORDER — TBO-FILGRASTIM 480 MCG/0.8ML ~~LOC~~ SOSY
480.0000 ug | PREFILLED_SYRINGE | Freq: Once | SUBCUTANEOUS | Status: AC
  Administered 2016-12-01: 480 ug via SUBCUTANEOUS
  Filled 2016-12-01: qty 0.8

## 2016-12-01 MED ORDER — ALBUTEROL SULFATE HFA 108 (90 BASE) MCG/ACT IN AERS: 2.0000 | INHALATION_SPRAY | RESPIRATORY_TRACT | Status: DC | PRN

## 2016-12-01 MED ORDER — TIOTROPIUM BROMIDE MONOHYDRATE 18 MCG IN CAPS
18.0000 ug | ORAL_CAPSULE | Freq: Every day | RESPIRATORY_TRACT | Status: DC
  Administered 2016-12-02 – 2016-12-09 (×8): 18 ug via RESPIRATORY_TRACT
  Filled 2016-12-01 (×2): qty 5

## 2016-12-01 MED ORDER — MAGNESIUM CITRATE PO SOLN
1.0000 | Freq: Once | ORAL | Status: AC | PRN
  Administered 2016-12-02: 1 via ORAL
  Filled 2016-12-01: qty 296

## 2016-12-01 MED ORDER — MIDODRINE HCL 5 MG PO TABS
5.0000 mg | ORAL_TABLET | Freq: Three times a day (TID) | ORAL | Status: DC
  Administered 2016-12-02 – 2016-12-09 (×16): 5 mg via ORAL
  Filled 2016-12-01 (×16): qty 1

## 2016-12-01 MED ORDER — ACETAMINOPHEN 325 MG PO TABS: 650.0000 mg | ORAL_TABLET | Freq: Four times a day (QID) | ORAL | Status: DC | PRN

## 2016-12-01 MED ORDER — ONDANSETRON HCL 4 MG/2ML IJ SOLN: 4.0000 mg | Freq: Four times a day (QID) | INTRAMUSCULAR | Status: DC | PRN

## 2016-12-01 MED ORDER — APIXABAN 5 MG PO TABS
5.0000 mg | ORAL_TABLET | Freq: Two times a day (BID) | ORAL | Status: DC
  Administered 2016-12-02 – 2016-12-09 (×16): 5 mg via ORAL
  Filled 2016-12-01 (×13): qty 1
  Filled 2016-12-01: qty 2
  Filled 2016-12-01 (×3): qty 1

## 2016-12-01 MED ORDER — METOPROLOL TARTRATE 50 MG PO TABS
75.0000 mg | ORAL_TABLET | Freq: Two times a day (BID) | ORAL | Status: DC
  Administered 2016-12-02 – 2016-12-04 (×5): 75 mg via ORAL
  Filled 2016-12-01 (×6): qty 1

## 2016-12-01 MED ORDER — NIACIN ER (ANTIHYPERLIPIDEMIC) 500 MG PO TBCR
500.0000 mg | EXTENDED_RELEASE_TABLET | Freq: Every day | ORAL | Status: DC
  Administered 2016-12-02 – 2016-12-08 (×8): 500 mg via ORAL
  Filled 2016-12-01 (×11): qty 1

## 2016-12-01 MED ORDER — PIPERACILLIN-TAZOBACTAM 3.375 G IVPB 30 MIN
3.3750 g | Freq: Once | INTRAVENOUS | Status: AC
  Administered 2016-12-01: 3.375 g via INTRAVENOUS
  Filled 2016-12-01: qty 50

## 2016-12-01 MED ORDER — SODIUM CHLORIDE 0.9% FLUSH
3.0000 mL | Freq: Two times a day (BID) | INTRAVENOUS | Status: DC
  Administered 2016-12-02 – 2016-12-08 (×11): 3 mL via INTRAVENOUS

## 2016-12-01 MED ORDER — SODIUM CHLORIDE 0.9 % IV BOLUS (SEPSIS)
1000.0000 mL | Freq: Once | INTRAVENOUS | Status: AC
  Administered 2016-12-01: 1000 mL via INTRAVENOUS

## 2016-12-01 MED ORDER — SENNOSIDES-DOCUSATE SODIUM 8.6-50 MG PO TABS
1.0000 | ORAL_TABLET | Freq: Every evening | ORAL | Status: DC | PRN
  Filled 2016-12-01: qty 1

## 2016-12-01 MED ORDER — SODIUM CHLORIDE 0.9 % IV BOLUS (SEPSIS): 1000.0000 mL | Freq: Once | INTRAVENOUS | Status: DC

## 2016-12-01 MED ORDER — SODIUM CHLORIDE 0.9 % IV SOLN
INTRAVENOUS | Status: DC
  Administered 2016-12-02 (×2): via INTRAVENOUS

## 2016-12-01 MED ORDER — ATORVASTATIN CALCIUM 40 MG PO TABS: 40.0000 mg | ORAL_TABLET | Freq: Every day | ORAL | Status: DC

## 2016-12-01 MED ORDER — PIPERACILLIN-TAZOBACTAM 3.375 G IVPB
3.3750 g | Freq: Three times a day (TID) | INTRAVENOUS | Status: DC
  Filled 2016-12-01 (×3): qty 50

## 2016-12-01 MED ORDER — SODIUM CHLORIDE 0.9 % IV SOLN
1250.0000 mg | INTRAVENOUS | Status: DC
  Filled 2016-12-01: qty 1250

## 2016-12-01 MED ORDER — TRAZODONE HCL 50 MG PO TABS
25.0000 mg | ORAL_TABLET | Freq: Every evening | ORAL | Status: DC | PRN
  Administered 2016-12-03 – 2016-12-06 (×3): 25 mg via ORAL
  Filled 2016-12-01 (×5): qty 1

## 2016-12-01 NOTE — ED Provider Notes (Signed)
Medical screening examination/treatment/procedure(s) were conducted as a shared visit with non-physician practitioner(s) and myself.  I personally evaluated the patient during the encounter.   EKG Interpretation  Date/Time:  Sunday December 01 2016 14:02:20 EST Ventricular Rate:  144 PR Interval:    QRS Duration: 149 QT Interval:  359 QTC Calculation: 556 R Axis:   88 Text Interpretation:  Extreme tachycardia with wide complex, no further rhythm analysis attempted Confirmed by Baldo Hufnagle  MD, Resa Rinks [phone]) on 12/01/2016 2:09:33 PM       Results for orders placed or performed during the hospital encounter of 12/01/16  Comprehensive metabolic panel  Result Value Ref Range   Sodium 139 135 - 145 mmol/L   Potassium 4.2 3.5 - 5.1 mmol/L   Chloride 106 101 - 111 mmol/L   CO2 23 22 - 32 mmol/L   Glucose, Bld 120 (H) 65 - 99 mg/dL   BUN 35 (H) 6 - 20 mg/dL   Creatinine, Ser 1.64 (H) 0.61 - 1.24 mg/dL   Calcium 9.0 8.9 - 10.3 mg/dL   Total Protein 6.9 6.5 - 8.1 g/dL   Albumin 3.1 (L) 3.5 - 5.0 g/dL   AST 370 (H) 15 - 41 U/L   ALT 305 (H) 17 - 63 U/L   Alkaline Phosphatase 201 (H) 38 - 126 U/L   Total Bilirubin 1.8 (H) 0.3 - 1.2 mg/dL   GFR calc non Af Amer 37 (L) >60 mL/min   GFR calc Af Amer 42 (L) >60 mL/min   Anion gap 10 5 - 15  CBC WITH DIFFERENTIAL  Result Value Ref Range   WBC 0.8 (LL) 4.0 - 10.5 K/uL   RBC 3.16 (L) 4.22 - 5.81 MIL/uL   Hemoglobin 9.8 (L) 13.0 - 17.0 g/dL   HCT 30.9 (L) 39.0 - 52.0 %   MCV 97.8 78.0 - 100.0 fL   MCH 31.0 26.0 - 34.0 pg   MCHC 31.7 30.0 - 36.0 g/dL   RDW 13.2 11.5 - 15.5 %   Platelets 115 (L) 150 - 400 K/uL   Neutrophils Relative % 83 %   Lymphocytes Relative 15 %   Monocytes Relative 1 %   Eosinophils Relative 1 %   Basophils Relative 0 %   Neutro Abs 0.7 (L) 1.7 - 7.7 K/uL   Lymphs Abs 0.1 (L) 0.7 - 4.0 K/uL   Monocytes Absolute 0.0 (L) 0.1 - 1.0 K/uL   Eosinophils Absolute 0.0 0.0 - 0.7 K/uL   Basophils Absolute 0.0 0.0 - 0.1  K/uL   Smear Review LARGE PLATELETS PRESENT   Urinalysis, Routine w reflex microscopic (not at [facility])  Result Value Ref Range   Color, Urine AMBER (A) YELLOW   APPearance TURBID (A) CLEAR   Specific Gravity, Urine 1.024 1.005 - 1.030   pH 5.5 5.0 - 8.0   Glucose, UA NEGATIVE NEGATIVE mg/dL   Hgb urine dipstick MODERATE (A) NEGATIVE   Bilirubin Urine NEGATIVE NEGATIVE   Ketones, ur NEGATIVE NEGATIVE mg/dL   Protein, ur 100 (A) NEGATIVE mg/dL   Nitrite POSITIVE (A) NEGATIVE   Leukocytes, UA LARGE (A) NEGATIVE  Urine microscopic-add on  Result Value Ref Range   Squamous Epithelial / LPF 0-5 (A) NONE SEEN   WBC, UA TOO NUMEROUS TO COUNT 0 - 5 WBC/hpf   RBC / HPF 6-30 0 - 5 RBC/hpf   Bacteria, UA MANY (A) NONE SEEN  I-Stat CG4 Lactic Acid, ED  (not at  [facility])  Result Value Ref Range   Lactic Acid,  Venous 3.09 (HH) 0.5 - 1.9 mmol/L   Comment NOTIFIED PHYSICIAN    Dg Abdomen 1 View  Result Date: 12/01/2016 CLINICAL DATA:  Abdominal distention.  Constipation. EXAM: ABDOMEN - 1 VIEW COMPARISON:  04/18/2016. FINDINGS: Motion blurring. Grossly normal bowel gas pattern. Small bilateral renal calculi are again demonstrated. Lumbar lower thoracic spine degenerative changes. Rectal temperature probe. IMPRESSION: No acute abnormality. Previously demonstrated bilateral renal calculi. Electronically Signed   By: Claudie Revering M.D.   On: 12/01/2016 14:52   Dg Chest Port 1 View  Result Date: 12/01/2016 CLINICAL DATA:  Shortness of breath. EXAM: PORTABLE CHEST 1 VIEW COMPARISON:  02/05/2014. FINDINGS: Interval mild enlargement of the cardiac silhouette, accentuated by a decreased inspiration and the portable AP technique. Increased prominence of the pulmonary vasculature and mild increased prominence of the interstitial markings. Stable post CABG changes and left subclavian bipolar pacemaker leads. Diffuse osteopenia. IMPRESSION: Interval mild cardiomegaly and mild changes of congestive heart failure.  Electronically Signed   By: Claudie Revering M.D.   On: 12/01/2016 14:54   Ct Renal Stone Study  Result Date: 12/01/2016 CLINICAL DATA:  82 year old male with history of bilateral flank pain. Constipation. Difficulty urinating. History of urinary tract infection. EXAM: CT ABDOMEN AND PELVIS WITHOUT CONTRAST TECHNIQUE: Multidetector CT imaging of the abdomen and pelvis was performed following the standard protocol without IV contrast. COMPARISON:  CT the abdomen and pelvis 06/22/2013. FINDINGS: Lower chest: Pacemaker leads in the right atrium and right ventricle. Atherosclerotic calcifications in the right coronary artery. Postoperative changes of prior CABG. Mild pleural thickening or trace amount of pleural fluid in the lower right hemithorax. Hepatobiliary: 1.5 cm low-attenuation lesion in the left lobe of the liver is incompletely characterized on today's noncontrast CT examination, but is favored to represent a cyst. Tiny amount of high density material lying dependently in the gallbladder may reflect tiny gallstones or a small amount of biliary sludge in the fundus. No sign to suggest an acute cholecystitis at this time. Pancreas: No definite pancreatic mass or peripancreatic inflammatory changes are noted on today's noncontrast CT examination. Spleen: Unremarkable. Adrenals/Urinary Tract: Multiple small nonobstructive calculi are present within the collecting systems of the kidneys bilaterally measuring up to 7 mm in the interpolar collecting system of the right kidney. No additional calculi are noted along the course of either ureter. No hydroureteronephrosis. Urinary bladder is nearly completely decompressed, but there is a 3 mm calculus in the urinary bladder (image 76 of series 2). Foley balloon catheter in place with tip in the lumen of the urinary bladder. 1.5 cm low low-attenuation lesion in the interpolar region of the right kidney is incompletely characterized on today's noncontrast CT examination,  but is statistically likely a cyst. Bilateral adrenal glands are normal in appearance. Stomach/Bowel: The unenhanced appearance of the stomach is normal. There is no pathologic dilatation of small bowel or colon. Numerous colonic diverticulae are noted, particularly in the sigmoid colon, without surrounding inflammatory changes to suggest an acute diverticulitis at this time. The appendix is not confidently identified and may be surgically absent. Regardless, there are no inflammatory changes noted adjacent to the cecum to suggest the presence of an acute appendicitis at this time. Vascular/Lymphatic: Aortic atherosclerosis, without evidence of aneurysm in the abdominal or pelvic vasculature. No lymphadenopathy is noted in the abdomen or pelvis on today's noncontrast CT examination. Reproductive: Prostate gland is mildly enlarged measuring 5.0 x 5.6 x 6.1 cm. Seminal vesicles are unremarkable in appearance. Other: Small right inguinal hernia containing  only fat. No significant volume of ascites. No pneumoperitoneum. Musculoskeletal: Chronic appearing compression fractures of T12 and L1, most severe at L1 where there is approximately 70% loss of central vertebral body height. There are no aggressive appearing lytic or blastic lesions noted in the visualized portions of the skeleton. IMPRESSION: 1. Multiple nonobstructive calculi are present within the collecting systems of the kidneys bilaterally measuring up to 7 mm on the right side. In addition, there is a 3 mm calculus in the lumen of the urinary bladder. No ureteral stones or findings of urinary tract obstruction are noted at this time. 2. Colonic diverticulosis without evidence of acute diverticulitis at this time. 3. Aortic atherosclerosis. 4. Prostatomegaly. 5. Additional incidental findings, as above. Electronically Signed   By: Vinnie Langton M.D.   On: 12/01/2016 16:01    Patient seen by me along with the physician assistant. Patient presented with a  fever of 104.6. Tachycardic but sinus tachycardia never had hypotension. Patient was fine yesterday. Was findings morning [place] fle[place]. Patient got acutely sick there was brought in by EMS. Initial of saturation was 88%. Patient currently now satting 96% on oxygen. Lactic acid was not elevated above 4 but it was greater than 2. Patient never hypotensive. Patient given Tylenol for the fever. Patient started on sepsis ordered to include broad spectrum antibiotics. Received at least a 1 L fluid bolus to not receive the entire fluid bolus for 2 reasons. One is that his lactic acid was less than 40 wasn't hypotensive. 2 chest ray raise concerns for some mild the pulmonary edema. Patient has a history of renal stones. An urinalysis was essentially purulent. But CT scan shows no recent pain stones in the ureter. Recently passed a stone. In addition patient had marked leukopenia. Etiology of this is still not clear. Patient will be given another 500 mL bolus since he still tachycardic of normal saline. Patient will require admission to stepdown unit.  CRITICAL CARE Performed by: Fredia Sorrow Total critical care time: 30 minutes Critical care time was exclusive of separately billable procedures and treating other patients. Critical care was necessary to treat or prevent imminent or life-threatening deterioration. Critical care was time spent personally by me on the following activities: development of treatment plan with patient and/or surrogate as well as nursing, discussions with consultants, evaluation of patient's response to treatment, examination of patient, obtaining history from patient or surrogate, ordering and performing treatments and interventions, ordering and review of laboratory studies, ordering and review of radiographic studies, pulse oximetry and re-evaluation of patient's condition.  On exam patient is alert and responding to questions. Tachycardic breathing a little fast but all  this may be secondary to the high fevers. Abdomen was soft and nontender. Lungs without wheezing.    Fredia Sorrow, MD 12/01/16 [phone]

## 2016-12-01 NOTE — Progress Notes (Addendum)
Pharmacy Antibiotic Note  [name] is a 82 y.o. male admitted on 12/01/2016 s/p possible seizure [place] fle[place] market.  Pharmacy has been consulted for vancomycin and Zosyn dosing for sepsis.    First doses of antibiotics are already ordered.  Baseline labs in progress.   Plan: - Vanc 1gm IV x 1 - Zosyn 3.375gm IV x 1 - F/U labs for further dosing   Weight: 192 lb (87.1 kg)  Temp (24hrs), Avg:104.6 F (40.3 C), Min:104.6 F (40.3 C), Max:104.6 F (40.3 C)   Recent Labs Lab 12/01/16 1433  LATICACIDVEN 3.09*    CrCl cannot be calculated (Patient's most recent lab result is older than the maximum 21 days allowed.).    No Known Allergies  Antimicrobials this admission:  Vanc 12/3 >> Zosyn 12/3 >>  Dose adjustments this admission:  N/A  Microbiology results:  12/3 UCx -  12/3 BCx x2 -   [name] D. Mina Marble, PharmD, BCPS Pager:  [phone] 12/01/2016, 2:57 PM   Addendum:  Scr is elevated at 1.64. Lactic acid is >3 and WBC is low at 0.8. First doses per EDP. Start vanc 1250mg  IV Q24H and zosyn 3.375gm IV Q8H (4 hr inf)  [name], PharmD, BCPS Pager # [phone] 12/01/2016 4:00 PM

## 2016-12-01 NOTE — H&P (Signed)
History and Physical    [name] [id number] DOB: Jun 30, 1929 DOA: 12/01/2016   PCP: Leonides Sake, MD   Patient coming from:  Home   Chief Complaint:  HPI: [name] is a 82 y.o. male extensive medical history listed below, including COPD, emphysema, anemia, MGUS, CAD, presenting to the emergency department today with fever up to 105, and rigors. 3rd patient report, symptoms began about 2 days ago, when he was increasingly constipated, and unable to urinate properly for about 3 days. The patient is passing status, but feels bloated. He denies any abdominal pain. He denies any increasing shortness of breath, nausea vomiting, food poisoning, or sick contacts. He denies any recent travels. Denies any back pain. He denies any lower extremity swelling. His vaccinations are up to date    ED Course:  BP 129/63   Pulse 68   Temp (!) 104.2 F (40.1 C) (Core (Comment)) Comment (Src): temp foley  Resp 26   Wt 87.1 kg (192 lb)   SpO2 96%   BMI 29.19 kg/m    , fever up to 105 , neutropenia with ANC 0. 7  Antibiotics delivered in the ED with Vanc/Zosyn. UA large leukocytes with positive nitrite, Initial Lactic acid 3.09 CO2 23  . EKG Sinus Tach, QTC 556  CXR with mild changes of CHF.  CT  Multiple  Non obstructive calculi Renal stone   Patient is on 2 L o2. Receiving  3 l NS to date   Review of Systems: As per HPI otherwise 10 point review of systems negative.   Past Medical History:  Diagnosis Date  . Anemia of renal disease 12/05/2011  . Anemia, iron deficiency 12/05/2011  . Asthma   . CAS (cerebral atherosclerosis)    Carotid Dopplers, 04/09/2012 - Bilateral Bulb/Proximal ICAs-mild to moderate amount of fibrous plaque of fibrous soft plaque w/o evidence of a significant diameter reduction, dissection, or any other vascular abnormality  . COPD (chronic obstructive pulmonary disease) ([place])   . Coronary heart disease    2D Echo, 04/13/2013 - EF 55-60%, moderate regurg of  the tricuspid valve  . Dysrhythmia 11/09/2013   SVT VS A FLUTTER   . Kidney stones   . MGUS (monoclonal gammopathy of unknown significance) 12/05/2011  . Nonspecific ST-T wave electrocardiographic changes    Lexiscan, 06/02/2012 - EKG negative for ischemia, compared to previous study-there is no significant change, normal myocardial perfusion study  . Orthostatic hypotension 09/10/2016  . Shortness of breath   . Status post placement of implantable loop recorder 12/09/2013    Past Surgical History:  Procedure Laterality Date  . CARDIAC CATHETERIZATION  12/05/2005   Recommended CABG  . CARDIOVERSION N/A 11/09/2013   Procedure: CARDIOVERSION;  Surgeon: Pixie Casino, MD;  Location: [facility] ENDOSCOPY;  Service: Cardiovascular;  Laterality: N/A;  . CATARACT EXTRACTION    . CORONARY ARTERY BYPASS GRAFT  12/06/2005   x4, LIMA to LAD, vein to distal circumflex, sequential vein to intermediate and obtuse marginal vessel  . INNER EAR SURGERY    . KIDNEY STONE SURGERY    . LOOP RECORDER IMPLANT N/A 11/30/2013   Procedure: LOOP RECORDER IMPLANT;  Surgeon: Sanda Klein, MD;  Location: [place] CATH LAB;  Service: Cardiovascular;  Laterality: N/A;  . PERMANENT PACEMAKER INSERTION N/A 02/04/2014   Procedure: PERMANENT PACEMAKER INSERTION;  Surgeon: Sanda Klein, MD;  Location: [facility] CATH LAB;  Service: Cardiovascular;  Laterality: N/A;  . TEE WITHOUT CARDIOVERSION N/A 11/09/2013   Procedure: TRANSESOPHAGEAL ECHOCARDIOGRAM (TEE);  Surgeon: Pixie Casino, MD;  Location: [facility] ENDOSCOPY;  Service: Cardiovascular;  Laterality: N/A;    Social History Social History   Social History  . Marital status: Married    Spouse name: N/A  . Number of children: N/A  . Years of education: N/A   Occupational History  . retired long distance Administrator   . maintenence    Social History Main Topics  . Smoking status: Former Smoker    Packs/day: 1.00    Years: 47.00    Types: Cigarettes    Start date: 12/02/1951     Quit date: 12/30/1993  . Smokeless tobacco: Never Used     Comment: quit 20 years ago  . Alcohol use No  . Drug use: No  . Sexual activity: Not on file   Other Topics Concern  . Not on file   Social History Narrative  . No narrative on file     No Known Allergies  Family History  Problem Relation Age of Onset  . Tuberculosis Father   . Heart attack Father   . Heart disease Mother   . Heart attack Mother   . Heart disease Brother   . Emphysema Daughter     premature without tobacco exposure      Prior to Admission medications   Medication Sig Start Date End Date Taking? Authorizing Provider  acetaminophen (TYLENOL) 500 MG tablet Take 1,000 mg by mouth every 6 (six) hours as needed.   Yes Historical Provider, MD  albuterol (PROVENTIL HFA;VENTOLIN HFA) 108 (90 BASE) MCG/ACT inhaler Inhale 2 puffs into the lungs every 4 (four) hours as needed for wheezing or shortness of breath. 11/30/15  Yes Collene Gobble, MD  apixaban (ELIQUIS) 5 MG TABS tablet Take 1 tablet (5 mg total) by mouth every 12 (twelve) hours. 03/09/16  Yes Mihai Croitoru, MD  atorvastatin (LIPITOR) 40 MG tablet TAKE 1 TABLET (40 MG TOTAL) BY MOUTH EVERY EVENING. 04/23/16  Yes Einar Pheasant Hager, PA-C  diphenhydramine-acetaminophen (TYLENOL PM) 25-500 MG TABS Take 2 tablets by mouth at bedtime.    Yes Historical Provider, MD  ENSURE (ENSURE) Take 1 Can by mouth every morning.   Yes Historical Provider, MD  metoprolol (LOPRESSOR) 50 MG tablet Take 1.5 tablets (75 mg total) by mouth 2 (two) times daily. 09/10/16  Yes Troy Sine, MD  midodrine (PROAMATINE) 5 MG tablet TAKE 1 TABLET (5 MG TOTAL) BY MOUTH 3 (THREE) TIMES DAILY. 04/23/16  Yes Brett Canales, PA-C  niacin (NIASPAN) 500 MG CR tablet TAKE 1 TABLET (500 MG TOTAL) BY MOUTH AT BEDTIME. 05/30/16  Yes Mihai Croitoru, MD  Probiotic Product ([employer] SENIOR PROBIOTIC PO) Take 1 capsule by mouth at bedtime. Once daily   Yes Historical Provider, MD  SYMBICORT 80-4.5 MCG/ACT inhaler  INHALE 2 PUFFS INTO THE LUNGS 2 (TWO) TIMES DAILY. 06/24/16  Yes Collene Gobble, MD  Tamsulosin HCl (FLOMAX) 0.4 MG CAPS Take 0.4 mg by mouth at bedtime.    Yes Historical Provider, MD  tiotropium (SPIRIVA HANDIHALER) 18 MCG inhalation capsule PLACE 1 CAPSULE (18 MCG TOTAL) INTO INHALER AND INHALE EVERY MORNING. 11/22/16  Yes Collene Gobble, MD    Physical Exam:    Vitals:   12/01/16 1430 12/01/16 1500 12/01/16 1515 12/01/16 1611  BP: 120/76 156/84 141/70 129/63  Pulse: (!) 138 (!) 143  68  Resp: 25  26   Temp:   (!) 104.2 F (40.1 C)   TempSrc:   Core (Comment)  SpO2: 94% 97%  96%  Weight:           Constitutional: uncomfortable and ill appearing  Vitals:   12/01/16 1430 12/01/16 1500 12/01/16 1515 12/01/16 1611  BP: 120/76 156/84 141/70 129/63  Pulse: (!) 138 (!) 143  68  Resp: 25  26   Temp:   (!) 104.2 F (40.1 C)   TempSrc:   Core (Comment)   SpO2: 94% 97%  96%  Weight:       Eyes: PERRL, lids and conjunctivae normal ENMT: Mucous membranes are moist. Posterior pharynx clear of any exudate or lesions.Normal dentition.  Neck: normal, supple, no masses, no thyromegaly Respirator  rhonchi, without wheezing or rales. Normal respiratory effort. No accessory muscle use.  Cardiovascular: tachycardic rate and rhythm, no murmurs / rubs / gallops. No extremity edema. 2+ pedal pulses. No carotid bruits.  Abdomen: no tenderness, no masses palpated. No hepatosplenomegaly. Bowel sounds positive.  Musculoskeletal: no clubbing / cyanosis. No joint deformity upper and lower extremities. Good ROM, no contractures. Normal muscle tone.  Skin: no rashes, lesions, ulcers.  Neurologic: CN 2-12 grossly intact. Sensation intact, DTR normal. Strength 5/5 in all 4.  Psychiatric: Normal judgment and insight. Alert and oriented x 3. Normal mood.     Labs on Admission: I have personally reviewed following labs and imaging studies  CBC:  Recent Labs Lab 12/01/16 1414  WBC 0.8*    NEUTROABS 0.7*  HGB 9.8*  HCT 30.9*  MCV 97.8  PLT 115*    Basic Metabolic Panel:  Recent Labs Lab 12/01/16 1414  NA 139  K 4.2  CL 106  CO2 23  GLUCOSE 120*  BUN 35*  CREATININE 1.64*  CALCIUM 9.0    GFR: Estimated Creatinine Clearance: 35.4 mL/min (by C-G formula based on SCr of 1.64 mg/dL (H)).  Liver Function Tests:  Recent Labs Lab 12/01/16 1414  AST 370*  ALT 305*  ALKPHOS 201*  BILITOT 1.8*  PROT 6.9  ALBUMIN 3.1*   No results for input(s): LIPASE, AMYLASE in the last 168 hours. No results for input(s): AMMONIA in the last 168 hours.  Coagulation Profile: No results for input(s): INR, PROTIME in the last 168 hours.  Cardiac Enzymes: No results for input(s): CKTOTAL, CKMB, CKMBINDEX, TROPONINI in the last 168 hours.  BNP (last 3 results) No results for input(s): PROBNP in the last 8760 hours.  HbA1C: No results for input(s): HGBA1C in the last 72 hours.  CBG: No results for input(s): GLUCAP in the last 168 hours.  Lipid Profile: No results for input(s): CHOL, HDL, LDLCALC, TRIG, CHOLHDL, LDLDIRECT in the last 72 hours.  Thyroid Function Tests: No results for input(s): TSH, T4TOTAL, FREET4, T3FREE, THYROIDAB in the last 72 hours.  Anemia Panel: No results for input(s): VITAMINB12, FOLATE, FERRITIN, TIBC, IRON, RETICCTPCT in the last 72 hours.  Urine analysis:    Component Value Date/Time   COLORURINE AMBER (A) 12/01/2016 1428   APPEARANCEUR TURBID (A) 12/01/2016 1428   LABSPEC 1.024 12/01/2016 1428   PHURINE 5.5 12/01/2016 1428   GLUCOSEU NEGATIVE 12/01/2016 1428   HGBUR MODERATE (A) 12/01/2016 1428   BILIRUBINUR NEGATIVE 12/01/2016 1428   KETONESUR NEGATIVE 12/01/2016 1428   PROTEINUR 100 (A) 12/01/2016 1428   UROBILINOGEN 0.2 01/01/2011 1624   NITRITE POSITIVE (A) 12/01/2016 1428   LEUKOCYTESUR LARGE (A) 12/01/2016 1428    Sepsis Labs: @LABRCNTIP (procalcitonin:4,lacticidven:4) )No results found for this or any previous visit  (from the past 240 hour(s)).   Radiological Exams on  Admission: Dg Abdomen 1 View  Result Date: 12/01/2016 CLINICAL DATA:  Abdominal distention.  Constipation. EXAM: ABDOMEN - 1 VIEW COMPARISON:  04/18/2016. FINDINGS: Motion blurring. Grossly normal bowel gas pattern. Small bilateral renal calculi are again demonstrated. Lumbar lower thoracic spine degenerative changes. Rectal temperature probe. IMPRESSION: No acute abnormality. Previously demonstrated bilateral renal calculi. Electronically Signed   By: Claudie Revering M.D.   On: 12/01/2016 14:52   Dg Chest Port 1 View  Result Date: 12/01/2016 CLINICAL DATA:  Shortness of breath. EXAM: PORTABLE CHEST 1 VIEW COMPARISON:  02/05/2014. FINDINGS: Interval mild enlargement of the cardiac silhouette, accentuated by a decreased inspiration and the portable AP technique. Increased prominence of the pulmonary vasculature and mild increased prominence of the interstitial markings. Stable post CABG changes and left subclavian bipolar pacemaker leads. Diffuse osteopenia. IMPRESSION: Interval mild cardiomegaly and mild changes of congestive heart failure. Electronically Signed   By: Claudie Revering M.D.   On: 12/01/2016 14:54   Ct Renal Stone Study  Result Date: 12/01/2016 CLINICAL DATA:  82 year old male with history of bilateral flank pain. Constipation. Difficulty urinating. History of urinary tract infection. EXAM: CT ABDOMEN AND PELVIS WITHOUT CONTRAST TECHNIQUE: Multidetector CT imaging of the abdomen and pelvis was performed following the standard protocol without IV contrast. COMPARISON:  CT the abdomen and pelvis 06/22/2013. FINDINGS: Lower chest: Pacemaker leads in the right atrium and right ventricle. Atherosclerotic calcifications in the right coronary artery. Postoperative changes of prior CABG. Mild pleural thickening or trace amount of pleural fluid in the lower right hemithorax. Hepatobiliary: 1.5 cm low-attenuation lesion in the left lobe of the liver is  incompletely characterized on today's noncontrast CT examination, but is favored to represent a cyst. Tiny amount of high density material lying dependently in the gallbladder may reflect tiny gallstones or a small amount of biliary sludge in the fundus. No sign to suggest an acute cholecystitis at this time. Pancreas: No definite pancreatic mass or peripancreatic inflammatory changes are noted on today's noncontrast CT examination. Spleen: Unremarkable. Adrenals/Urinary Tract: Multiple small nonobstructive calculi are present within the collecting systems of the kidneys bilaterally measuring up to 7 mm in the interpolar collecting system of the right kidney. No additional calculi are noted along the course of either ureter. No hydroureteronephrosis. Urinary bladder is nearly completely decompressed, but there is a 3 mm calculus in the urinary bladder (image 76 of series 2). Foley balloon catheter in place with tip in the lumen of the urinary bladder. 1.5 cm low low-attenuation lesion in the interpolar region of the right kidney is incompletely characterized on today's noncontrast CT examination, but is statistically likely a cyst. Bilateral adrenal glands are normal in appearance. Stomach/Bowel: The unenhanced appearance of the stomach is normal. There is no pathologic dilatation of small bowel or colon. Numerous colonic diverticulae are noted, particularly in the sigmoid colon, without surrounding inflammatory changes to suggest an acute diverticulitis at this time. The appendix is not confidently identified and may be surgically absent. Regardless, there are no inflammatory changes noted adjacent to the cecum to suggest the presence of an acute appendicitis at this time. Vascular/Lymphatic: Aortic atherosclerosis, without evidence of aneurysm in the abdominal or pelvic vasculature. No lymphadenopathy is noted in the abdomen or pelvis on today's noncontrast CT examination. Reproductive: Prostate gland is mildly  enlarged measuring 5.0 x 5.6 x 6.1 cm. Seminal vesicles are unremarkable in appearance. Other: Small right inguinal hernia containing only fat. No significant volume of ascites. No pneumoperitoneum. Musculoskeletal: Chronic appearing compression fractures  of T12 and L1, most severe at L1 where there is approximately 70% loss of central vertebral body height. There are no aggressive appearing lytic or blastic lesions noted in the visualized portions of the skeleton. IMPRESSION: 1. Multiple nonobstructive calculi are present within the collecting systems of the kidneys bilaterally measuring up to 7 mm on the right side. In addition, there is a 3 mm calculus in the lumen of the urinary bladder. No ureteral stones or findings of urinary tract obstruction are noted at this time. 2. Colonic diverticulosis without evidence of acute diverticulitis at this time. 3. Aortic atherosclerosis. 4. Prostatomegaly. 5. Additional incidental findings, as above. Electronically Signed   By: Vinnie Langton M.D.   On: 12/01/2016 16:01    EKG: Independently reviewed.  Assessment/Plan Active Problems:   MGUS (monoclonal gammopathy of unknown significance)   Chronic anticoagulation   UTI (urinary tract infection)   Lactic acidosis   Acute respiratory failure with hypoxia ([facility])   Acute-on-chronic kidney injury ([place])  Sepsis  organism unknown Patient meets criteria given tachycardia, tachypnea, fever up to 105 , neutropenia with ANC 0. 7  Antibiotics delivered in the ED with Vanc/Zosyn. UA large leukocytes with positive nitrite, Initial Lactic acid 3.09 CO2 23  . EKG Sinus Tach but paced , QTC 556  CXR with mild changes of CHF.  CT  Multiple  Non obstructive calculi Renal stone   Patient is on 2 L o2. Receiving  3 l NS to date Admit to SDU Sepsis order set  IV antibiotics by pharmacy withVanc and Zosyn  Follow lactic acid q 6 hrs Follow blood and urine cultures IV fluids at 100 cc/h.  Procalcitonin order set    Leukopenia in the setting of sepsis. Current WBC 0.8 with ANC 0.7  Cultures pending. EDP spoke with Oncologist on call, who recommended Granix, possible consult is pending as Pharmacist does not provide GSF in ED  Continue IV antibiotics as above     MGUS, followed by Dr. Marin Olp as OP Has been notified of patient's admission. Platelets 115, monitor closely  May hold Heparin if PLts drop to less than 70k  M-spike was 0.5 g/dL, IgG level was 1,125 mg/dL and lamba light chain was 12.79 mg/dL.   COPD  Continue nebs and O2  Continue Antibiotics as above    Chronic kidney disease Baseline creatinine 1.2-1.4     Current Cr 1.64  Lab Results  Component Value Date   CREATININE 1.64 (H) 12/01/2016   CREATININE 1.2 11/07/2016   CREATININE 1.4 (H) 05/09/2016   IVF Repeat CMET in am  CAD s/p CABG,  EKG  Paced ST without ACS, QTC 556,  Troponin 0.04  , patient is cardiac pain free at this time. Continue meds   Anemia of chronic disease and iron deficiency  Hemoglobin on admission 9.8 , no bleeding issues noted  Repeat CBC in am -Transfuse 1 unit packed red blood cells if Hb less than 8 or acutely bleeding     Abnormal LFTs. CT a renal with multiple calculi . No history of  hepatitis per chart   Continue hydration and monitor closely  Will consider Hepatitis panel Repeat LFTs    Hyperlipidemia Continue home statins       DVT prophylaxis: Eliquis  Code Status:   Full     Family Communication:  Discussed with patient and daughter  Disposition Plan: Expect patient to be discharged to home after condition improves Consults called:   Oncology and Urology have been  notified by EDP Admission status:  SDU    Rondel Jumbo, PA-C [facility]   12/01/2016, 5:16 PM

## 2016-12-01 NOTE — ED Provider Notes (Signed)
[facility] DEPT Provider Note   CSN: [id number] Arrival date & time: 12/01/16  1354     History   Chief Complaint Chief Complaint  Patient presents with  . Code Sepsis    HPI [name] is a 82 y.o. male.  82 year old Caucasian male pmh sig for COPD, emphysema, anemia, MGUS, CAD presenting to the ED with fevers and rigor. Patient states he has been unable to urinary and has been constipated for the past 3 days. Patient with history of constipation. Per daughter who is at bedside [place] and came out of the bathroom with what was thought to be a seizure by bystanders. Patient complains of rigors. EMS was called and transported patient to the Ed. He was noted to have a fever of 105 in route by ems. Patient does report passing gas but states he feels bloated. Patient denies any pain at this time. He only complains of not being able to urinate. Patient with history of bilateral kidney stones and is followed by [facility]. Denies any ha, vision changes, lightheadedness, dizziness, cough, cp, sob, abd pain, nausea, emesis, numbness/tingling.        The history is provided by the patient and a relative.   With a past medical history significant for COPD, emphysema, anemia, monoclonal gammopathy of unknown significance, CAD that presents to the ED today for fever and shaking by EMS. Patient was at [facility] today after bystanders called EMS for patient with possible seizure-like activity. EMS states that bystanders report patient shaking. On my assessment patient states that he has been able to have a bowel movement or urinate for the past 3 days. Patient states that he is passing gas but feels constipated. He denies any abdominal pain. His daughter is at bedside and states that he has been complaining of constipation for the past 3 days. She is given a MiraLAX and stool softeners without any relief. Patient reports he does not go to urinate for  the past 3 days. Past Medical History:  Diagnosis Date  . Anemia of renal disease 12/05/2011  . Anemia, iron deficiency 12/05/2011  . Asthma   . CAS (cerebral atherosclerosis)    Carotid Dopplers, 04/09/2012 - Bilateral Bulb/Proximal ICAs-mild to moderate amount of fibrous plaque of fibrous soft plaque w/o evidence of a significant diameter reduction, dissection, or any other vascular abnormality  . COPD (chronic obstructive pulmonary disease) ([place])   . Coronary heart disease    2D Echo, 04/13/2013 - EF 55-60%, moderate regurg of the tricuspid valve  . Dysrhythmia 11/09/2013   SVT VS A FLUTTER   . Kidney stones   . MGUS (monoclonal gammopathy of unknown significance) 12/05/2011  . Nonspecific ST-T wave electrocardiographic changes    Lexiscan, 06/02/2012 - EKG negative for ischemia, compared to previous study-there is no significant change, normal myocardial perfusion study  . Orthostatic hypotension 09/10/2016  . Shortness of breath   . Status post placement of implantable loop recorder 12/09/2013    Patient Active Problem List   Diagnosis Date Noted  . Orthostatic hypotension 09/10/2016  . Chronic anticoagulation 04/23/2016  . Hyperlipidemia 05/09/2014  . AVB (atrioventricular block)- high grade second degree 02/05/2014  . Pacemaker- MDT 02/04/14 02/04/2014  . 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 Nonspecific ST-T wave electrocardiographic changes   . Coronary heart disease   . COPD (chronic obstructive pulmonary disease) ([place])   . Atrial flutter ([place]) 08/15/2013  . Syncope 06/24/2013  . Bronchitis with airway  obstruction ([place]) 05/15/2012  . MGUS (monoclonal gammopathy of unknown significance) 12/05/2011  . Anemia of renal disease 12/05/2011  . Anemia, iron deficiency 12/05/2011  . ALLERGIC RHINITIS 04/06/2008  . Coronary atherosclerosis 03/11/2008  . EMPHYSEMA 03/11/2008  . C O P D 03/11/2008    Past Surgical History:  Procedure Laterality Date  . CARDIAC CATHETERIZATION  12/05/2005    Recommended CABG  . CARDIOVERSION N/A 11/09/2013   Procedure: CARDIOVERSION;  Surgeon: Pixie Casino, MD;  Location: [facility] ENDOSCOPY;  Service: Cardiovascular;  Laterality: N/A;  . CATARACT EXTRACTION    . CORONARY ARTERY BYPASS GRAFT  12/06/2005   x4, LIMA to LAD, vein to distal circumflex, sequential vein to intermediate and obtuse marginal vessel  . INNER EAR SURGERY    . KIDNEY STONE SURGERY    . LOOP RECORDER IMPLANT N/A 11/30/2013   Procedure: LOOP RECORDER IMPLANT;  Surgeon: Sanda Klein, MD;  Location: [facility] CATH LAB;  Service: Cardiovascular;  Laterality: N/A;  . PERMANENT PACEMAKER INSERTION N/A 02/04/2014   Procedure: PERMANENT PACEMAKER INSERTION;  Surgeon: Sanda Klein, MD;  Location: [facility] CATH LAB;  Service: Cardiovascular;  Laterality: N/A;  . TEE WITHOUT CARDIOVERSION N/A 11/09/2013   Procedure: TRANSESOPHAGEAL ECHOCARDIOGRAM (TEE);  Surgeon: Pixie Casino, MD;  Location: [facility] ENDOSCOPY;  Service: Cardiovascular;  Laterality: N/A;       Home Medications    Prior to Admission medications   Medication Sig Start Date End Date Taking? Authorizing Provider  acetaminophen (TYLENOL) 500 MG tablet Take 1,000 mg by mouth every 6 (six) hours as needed.   Yes Historical Provider, MD  albuterol (PROVENTIL HFA;VENTOLIN HFA) 108 (90 BASE) MCG/ACT inhaler Inhale 2 puffs into the lungs every 4 (four) hours as needed for wheezing or shortness of breath. 11/30/15  Yes Collene Gobble, MD  apixaban (ELIQUIS) 5 MG TABS tablet Take 1 tablet (5 mg total) by mouth every 12 (twelve) hours. 03/09/16  Yes Mihai Croitoru, MD  atorvastatin (LIPITOR) 40 MG tablet TAKE 1 TABLET (40 MG TOTAL) BY MOUTH EVERY EVENING. 04/23/16  Yes Einar Pheasant Hager, PA-C  diphenhydramine-acetaminophen (TYLENOL PM) 25-500 MG TABS Take 2 tablets by mouth at bedtime.    Yes Historical Provider, MD  ENSURE (ENSURE) Take 1 Can by mouth every morning.   Yes Historical Provider, MD  metoprolol (LOPRESSOR) 50 MG tablet Take 1.5 tablets (75  mg total) by mouth 2 (two) times daily. 09/10/16  Yes Troy Sine, MD  midodrine (PROAMATINE) 5 MG tablet TAKE 1 TABLET (5 MG TOTAL) BY MOUTH 3 (THREE) TIMES DAILY. 04/23/16  Yes Brett Canales, PA-C  niacin (NIASPAN) 500 MG CR tablet TAKE 1 TABLET (500 MG TOTAL) BY MOUTH AT BEDTIME. 05/30/16  Yes Mihai Croitoru, MD  Probiotic Product ([employer] SENIOR PROBIOTIC PO) Take 1 capsule by mouth at bedtime. Once daily   Yes Historical Provider, MD  SYMBICORT 80-4.5 MCG/ACT inhaler INHALE 2 PUFFS INTO THE LUNGS 2 (TWO) TIMES DAILY. 06/24/16  Yes Collene Gobble, MD  Tamsulosin HCl (FLOMAX) 0.4 MG CAPS Take 0.4 mg by mouth at bedtime.    Yes Historical Provider, MD  tiotropium (SPIRIVA HANDIHALER) 18 MCG inhalation capsule PLACE 1 CAPSULE (18 MCG TOTAL) INTO INHALER AND INHALE EVERY MORNING. 11/22/16  Yes Collene Gobble, MD    Family History Family History  Problem Relation Age of Onset  . Tuberculosis Father   . Heart attack Father   . Heart disease Mother   . Heart attack Mother   . Heart disease  Brother   . Emphysema Daughter     premature without tobacco exposure    Social History Social History  Substance Use Topics  . Smoking status: Former Smoker    Packs/day: 1.00    Years: 47.00    Types: Cigarettes    Start date: 12/02/1951    Quit date: 12/30/1993  . Smokeless tobacco: Never Used     Comment: quit 20 years ago  . Alcohol use No     Allergies   Patient has no known allergies.   Review of Systems Review of Systems  Constitutional: Positive for chills and fever.  HENT: Negative for congestion.   Respiratory: Negative for cough and shortness of breath.   Cardiovascular: Negative for chest pain, palpitations and leg swelling.  Gastrointestinal: Positive for constipation and diarrhea. Negative for abdominal distention, abdominal pain, blood in stool, nausea and vomiting.  Genitourinary: Positive for decreased urine volume, difficulty urinating, dysuria, frequency and urgency. Negative  for flank pain.  Musculoskeletal: Negative for back pain.  Skin: Negative for color change and wound.  Neurological: Negative for dizziness, weakness, light-headedness, numbness and headaches.     Physical Exam Updated Vital Signs BP 129/63   Pulse 68   Temp (!) 104.2 F (40.1 C) (Core (Comment)) Comment (Src): temp foley  Resp 26   Wt 87.1 kg   SpO2 96%   BMI 29.19 kg/m   Physical Exam  Constitutional: He is oriented to person, place, and time. He appears well-developed and well-nourished. No distress.  Patient is awake and alert and talking in complete sentences. Appears uncomfortable due to unable to urinate. Patient shaking and complains of being cold.  HENT:  Head: Normocephalic and atraumatic.  Mouth/Throat: Oropharynx is clear and moist and mucous membranes are normal.  Eyes: Conjunctivae are normal. Pupils are equal, round, and reactive to light. Right eye exhibits no discharge. Left eye exhibits no discharge. No scleral icterus.  Neck: Normal range of motion. Neck supple. No thyromegaly present.  Cardiovascular: Regular rhythm, normal heart sounds and intact distal pulses.  Tachycardia present.  Exam reveals no gallop and no friction rub.   No murmur heard. Pulses:      Radial pulses are 2+ on the right side, and 2+ on the left side.  Pulmonary/Chest: Effort normal and breath sounds normal. No tachypnea. No respiratory distress. He has no decreased breath sounds. He has no wheezes. He has no rhonchi.  Abdominal: Soft. Bowel sounds are normal. He exhibits distension. There is tenderness (mild) in the suprapubic area. There is no rigidity, no rebound, no guarding and no CVA tenderness.  Musculoskeletal: Normal range of motion.  Lymphadenopathy:    He has no cervical adenopathy.  Neurological: He is alert and oriented to person, place, and time.  Skin: Skin is warm and dry. Capillary refill takes less than 2 seconds.  Nursing note and vitals reviewed.    ED Treatments  / Results  Labs (all labs ordered are listed, but only abnormal results are displayed) Labs Reviewed  COMPREHENSIVE METABOLIC PANEL - Abnormal; Notable for the following:       Result Value   Glucose, Bld 120 (*)    BUN 35 (*)    Creatinine, Ser 1.64 (*)    Albumin 3.1 (*)    AST 370 (*)    ALT 305 (*)    Alkaline Phosphatase 201 (*)    Total Bilirubin 1.8 (*)    GFR calc non Af Amer 37 (*)    GFR calc  Af Amer 42 (*)    All other components within normal limits  CBC WITH DIFFERENTIAL/PLATELET - Abnormal; Notable for the following:    WBC 0.8 (*)    RBC 3.16 (*)    Hemoglobin 9.8 (*)    HCT 30.9 (*)    Platelets 115 (*)    Neutro Abs 0.7 (*)    Lymphs Abs 0.1 (*)    Monocytes Absolute 0.0 (*)    All other components within normal limits  URINALYSIS, ROUTINE W REFLEX MICROSCOPIC (NOT AT [facility] - Abnormal; Notable for the following:    Color, Urine AMBER (*)    APPearance TURBID (*)    Hgb urine dipstick MODERATE (*)    Protein, ur 100 (*)    Nitrite POSITIVE (*)    Leukocytes, UA LARGE (*)    All other components within normal limits  URINE MICROSCOPIC-ADD ON - Abnormal; Notable for the following:    Squamous Epithelial / LPF 0-5 (*)    Bacteria, UA MANY (*)    All other components within normal limits  I-STAT CG4 LACTIC ACID, ED - Abnormal; Notable for the following:    Lactic Acid, Venous 3.09 (*)    All other components within normal limits  CULTURE, BLOOD (ROUTINE X 2)  CULTURE, BLOOD (ROUTINE X 2)  URINE CULTURE    EKG  EKG Interpretation  Date/Time:  Sunday December 01 2016 14:02:20 EST Ventricular Rate:  144 PR Interval:    QRS Duration: 149 QT Interval:  359 QTC Calculation: 556 R Axis:   88 Text Interpretation:  Extreme tachycardia with wide complex, no further rhythm analysis attempted Confirmed by Rogene Houston  MD, SCOTT [phone]) on 12/01/2016 2:09:33 PM       Radiology Dg Abdomen 1 View  Result Date: 12/01/2016 CLINICAL DATA:  Abdominal distention.   Constipation. EXAM: ABDOMEN - 1 VIEW COMPARISON:  04/18/2016. FINDINGS: Motion blurring. Grossly normal bowel gas pattern. Small bilateral renal calculi are again demonstrated. Lumbar lower thoracic spine degenerative changes. Rectal temperature probe. IMPRESSION: No acute abnormality. Previously demonstrated bilateral renal calculi. Electronically Signed   By: Claudie Revering M.D.   On: 12/01/2016 14:52   Dg Chest Port 1 View  Result Date: 12/01/2016 CLINICAL DATA:  Shortness of breath. EXAM: PORTABLE CHEST 1 VIEW COMPARISON:  02/05/2014. FINDINGS: Interval mild enlargement of the cardiac silhouette, accentuated by a decreased inspiration and the portable AP technique. Increased prominence of the pulmonary vasculature and mild increased prominence of the interstitial markings. Stable post CABG changes and left subclavian bipolar pacemaker leads. Diffuse osteopenia. IMPRESSION: Interval mild cardiomegaly and mild changes of congestive heart failure. Electronically Signed   By: Claudie Revering M.D.   On: 12/01/2016 14:54   Ct Renal Stone Study  Result Date: 12/01/2016 CLINICAL DATA:  82 year old male with history of bilateral flank pain. Constipation. Difficulty urinating. History of urinary tract infection. EXAM: CT ABDOMEN AND PELVIS WITHOUT CONTRAST TECHNIQUE: Multidetector CT imaging of the abdomen and pelvis was performed following the standard protocol without IV contrast. COMPARISON:  CT the abdomen and pelvis 06/22/2013. FINDINGS: Lower chest: Pacemaker leads in the right atrium and right ventricle. Atherosclerotic calcifications in the right coronary artery. Postoperative changes of prior CABG. Mild pleural thickening or trace amount of pleural fluid in the lower right hemithorax. Hepatobiliary: 1.5 cm low-attenuation lesion in the left lobe of the liver is incompletely characterized on today's noncontrast CT examination, but is favored to represent a cyst. Tiny amount of high density material lying  dependently in  the gallbladder may reflect tiny gallstones or a small amount of biliary sludge in the fundus. No sign to suggest an acute cholecystitis at this time. Pancreas: No definite pancreatic mass or peripancreatic inflammatory changes are noted on today's noncontrast CT examination. Spleen: Unremarkable. Adrenals/Urinary Tract: Multiple small nonobstructive calculi are present within the collecting systems of the kidneys bilaterally measuring up to 7 mm in the interpolar collecting system of the right kidney. No additional calculi are noted along the course of either ureter. No hydroureteronephrosis. Urinary bladder is nearly completely decompressed, but there is a 3 mm calculus in the urinary bladder (image 76 of series 2). Foley balloon catheter in place with tip in the lumen of the urinary bladder. 1.5 cm low low-attenuation lesion in the interpolar region of the right kidney is incompletely characterized on today's noncontrast CT examination, but is statistically likely a cyst. Bilateral adrenal glands are normal in appearance. Stomach/Bowel: The unenhanced appearance of the stomach is normal. There is no pathologic dilatation of small bowel or colon. Numerous colonic diverticulae are noted, particularly in the sigmoid colon, without surrounding inflammatory changes to suggest an acute diverticulitis at this time. The appendix is not confidently identified and may be surgically absent. Regardless, there are no inflammatory changes noted adjacent to the cecum to suggest the presence of an acute appendicitis at this time. Vascular/Lymphatic: Aortic atherosclerosis, without evidence of aneurysm in the abdominal or pelvic vasculature. No lymphadenopathy is noted in the abdomen or pelvis on today's noncontrast CT examination. Reproductive: Prostate gland is mildly enlarged measuring 5.0 x 5.6 x 6.1 cm. Seminal vesicles are unremarkable in appearance. Other: Small right inguinal hernia containing only fat. No  significant volume of ascites. No pneumoperitoneum. Musculoskeletal: Chronic appearing compression fractures of T12 and L1, most severe at L1 where there is approximately 70% loss of central vertebral body height. There are no aggressive appearing lytic or blastic lesions noted in the visualized portions of the skeleton. IMPRESSION: 1. Multiple nonobstructive calculi are present within the collecting systems of the kidneys bilaterally measuring up to 7 mm on the right side. In addition, there is a 3 mm calculus in the lumen of the urinary bladder. No ureteral stones or findings of urinary tract obstruction are noted at this time. 2. Colonic diverticulosis without evidence of acute diverticulitis at this time. 3. Aortic atherosclerosis. 4. Prostatomegaly. 5. Additional incidental findings, as above. Electronically Signed   By: Vinnie Langton M.D.   On: 12/01/2016 16:01    Procedures Procedures (including critical care time)  Medications Ordered in ED Medications  sodium chloride 0.9 % bolus 1,000 mL (0 mLs Intravenous Stopped 12/01/16 1549)    And  sodium chloride 0.9 % bolus 1,000 mL (0 mLs Intravenous Stopped 12/01/16 1549)    And  sodium chloride 0.9 % bolus 1,000 mL (1,000 mLs Intravenous Not Given 12/01/16 1528)  vancomycin (VANCOCIN) 1,250 mg in sodium chloride 0.9 % 250 mL IVPB (not administered)  piperacillin-tazobactam (ZOSYN) IVPB 3.375 g (not administered)  acetaminophen (TYLENOL) tablet 1,000 mg (1,000 mg Oral Given 12/01/16 1427)  piperacillin-tazobactam (ZOSYN) IVPB 3.375 g (0 g Intravenous Stopped 12/01/16 1456)  vancomycin (VANCOCIN) IVPB 1000 mg/200 mL premix (0 mg Intravenous Stopped 12/01/16 1613)     Initial Impression / Assessment and Plan / ED Course  I have reviewed the triage vital signs and the nursing notes.  Pertinent labs & imaging results that were available during my care of the patient were reviewed by me and considered in my medical decision  making (see chart for  details).  Clinical Course   Patient presented to the ED with fever of 105 and rigors. Patient with sign tachycardia at a rate of 140s and regular. Normotensive at this time. Patient was initally hypoxic and satting at 88% but has been satting at 95% on RA in the ED. Code sepsis was initiated due to elevated fevers and tachycardia. Lactic acid was noted to be 3.2. Patient received 1.5 L of fluid bolus due to lactic less than 4 and cxr that showed concern for mild pulmonary edema. Patient with history of renal stones and urine was purulent. US showed signs of infection. CT of abd was preformed that was without obstructing stone. Prostate was noted to be enlarged. Recently passed a kideny stone. CBC was obtained that showed leukopenia of of .8. Etiology is unclear likely causing patient sig fever. Patient continues to be tachycardic and febrile despite given 1g of tylenol. Broad spectrum abx were started including vancy and zosyn. Consulted with hospital medicine and spoke with Dr. Nehemiah Settle who agrees to admit patient to step down. Consulted patient oncologist who follows him for [facility] and he recommends ordering 480 of neutrogen to increased white count. Order with pharm. Patient is currently hemodynamically stable at this time. He does not appear in distress. PAtient was seen and examined by Dr. Rogene Houston who is agreeable to the above plan.  CRITICAL CARE Performed by: Ocie Cornfield   Total critical care time: 30 minutes  Critical care time was exclusive of separately billable procedures and treating other patients.  Critical care was necessary to treat or prevent imminent or life-threatening deterioration.  Critical care was time spent personally by me on the following activities: development of treatment plan with patient and/or surrogate as well as nursing, discussions with consultants, evaluation of patient's response to treatment, examination of patient, obtaining history from patient or  surrogate, ordering and performing treatments and interventions, ordering and review of laboratory studies, ordering and review of radiographic studies, pulse oximetry and re-evaluation of patient's condition.   Final Clinical Impressions(s) / ED Diagnoses   Final diagnoses:  Sepsis, due to unspecified organism [facility])  Pyelonephritis    New Prescriptions Current Discharge Medication List       Doristine Devoid, PA-C 12/02/16 2313    Fredia Sorrow, MD 12/04/16 [phone]

## 2016-12-01 NOTE — ED Notes (Signed)
Pt. Transported to CT 

## 2016-12-01 NOTE — ED Notes (Signed)
Daughter -- Rogelia Mire care POA  [phone] (daughter states you have to dial [phone] first)

## 2016-12-01 NOTE — Consult Note (Signed)
Referral MD  Reason for Referral: Uro-sepsis with pancytopenia. History of IgG lambda MGUS   Chief Complaint  Patient presents with  . Code Sepsis  : Have a urinary tract infection.  HPI: [name] is well-known to me. He is 82 year old white gentleman. I've been following him for several years. He has an IgG lambda MGUS. This really has not been an issue. He has multifactorial anemia. He has some renal insufficiency. He's had history of iron deficiency.  We typically see him twice a year. We last saw him in November. At that time, his M spike was 0.6 g/dL. His IgG level was 979 mg/dL. His lambda light chain was a 8.5 mg/dL. This was all very stable. His iron studies were somewhat borderline. His ferritin was 445. His iron saturation was 22%.  [place]. He had shakes. He had chills. He got very weak. Frankly, the people know him well there. They called EMS. He was transported to come hospital.  In the ER, he is felt to have a urinary tract infection. He had a CT scan done. This showed bilateral kidney stones. He had mild prostate enlargement. He says that he has a hard time tone when he urinates.  But was surprising is that he is pancytopenic. His white cell count 0.8. Hemoglobin 9.8. Platelet count 115,000. MCV was 98. Had a normal white cell differential. We are asked to see him to try to help out.  He looks good. He sounds pretty good. The urine culture is not back yet.  He denies any obvious fever. He's had no bleeding. He's had no cough. He's had no nausea or vomiting. He had a good Thanksgiving.  Overall, his performance status is ECOG 2.    Past Medical History:  Diagnosis Date  . Anemia of renal disease 12/05/2011  . Anemia, iron deficiency 12/05/2011  . Asthma   . CAS (cerebral atherosclerosis)    Carotid Dopplers, 04/09/2012 - Bilateral Bulb/Proximal ICAs-mild to moderate amount of fibrous plaque of fibrous soft plaque w/o evidence of a significant  diameter reduction, dissection, or any other vascular abnormality  . COPD (chronic obstructive pulmonary disease) ([place])   . Coronary heart disease    2D Echo, 04/13/2013 - EF 55-60%, moderate regurg of the tricuspid valve  . Dysrhythmia 11/09/2013   SVT VS A FLUTTER   . Kidney stones   . MGUS (monoclonal gammopathy of unknown significance) 12/05/2011  . Nonspecific ST-T wave electrocardiographic changes    Lexiscan, 06/02/2012 - EKG negative for ischemia, compared to previous study-there is no significant change, normal myocardial perfusion study  . Orthostatic hypotension 09/10/2016  . Shortness of breath   . Status post placement of implantable loop recorder 12/09/2013  :  Past Surgical History:  Procedure Laterality Date  . CARDIAC CATHETERIZATION  12/05/2005   Recommended CABG  . CARDIOVERSION N/A 11/09/2013   Procedure: CARDIOVERSION;  Surgeon: Pixie Casino, MD;  Location[facility] ENDOSCOPY;  Service: Cardiovascular;  Laterality: N/A;  . CATARACT EXTRACTION    . CORONARY ARTERY BYPASS GRAFT  12/06/2005   x4, LIMA to LAD, vein to distal circumflex, sequential vein to intermediate and obtuse marginal vessel  . INNER EAR SURGERY    . KIDNEY STONE SURGERY    . LOOP RECORDER IMPLANT N/A 11/30/2013   Procedure: LOOP RECORDER IMPLANT;  Surgeon: Sanda Klein, MD;  Location: [facility] CATH LAB;  Service: Cardiovascular;  Laterality: N/A;  . PERMANENT PACEMAKER INSERTION N/A 02/04/2014   Procedure: PERMANENT  PACEMAKER INSERTION;  Surgeon: Sanda Klein, MD;  Location: [facility] CATH LAB;  Service: Cardiovascular;  Laterality: N/A;  . TEE WITHOUT CARDIOVERSION N/A 11/09/2013   Procedure: TRANSESOPHAGEAL ECHOCARDIOGRAM (TEE);  Surgeon: Pixie Casino, MD;  Location: [facility] ENDOSCOPY;  Service: Cardiovascular;  Laterality: N/A;  :   Current Facility-Administered Medications:  .  0.9 %  sodium chloride infusion, , Intravenous, Continuous, Coralee Pesa Wertman, PA-C .  acetaminophen (TYLENOL) tablet 650 mg, 650 mg,  Oral, Q6H PRN **OR** acetaminophen (TYLENOL) suppository 650 mg, 650 mg, Rectal, Q6H PRN, Rondel Jumbo, PA-C .  albuterol (PROVENTIL HFA;VENTOLIN HFA) 108 (90 Base) MCG/ACT inhaler 2 puff, 2 puff, Inhalation, Q4H PRN, Rondel Jumbo, PA-C .  apixaban (ELIQUIS) tablet 5 mg, 5 mg, Oral, Q12H, Rondel Jumbo, PA-C .  atorvastatin (LIPITOR) tablet 40 mg, 40 mg, Oral, q1800, Rondel Jumbo, PA-C .  bisacodyl (DULCOLAX) suppository 10 mg, 10 mg, Rectal, Daily PRN, Rondel Jumbo, PA-C .  HYDROcodone-acetaminophen (NORCO/VICODIN) 5-325 MG per tablet 1-2 tablet, 1-2 tablet, Oral, Q4H PRN, Rondel Jumbo, PA-C .  magnesium citrate solution 1 Bottle, 1 Bottle, Oral, Once PRN, Rondel Jumbo, PA-C .  Derrill Memo ON 12/02/2016] metoprolol tartrate (LOPRESSOR) tablet 75 mg, 75 mg, Oral, BID, Sara E Wertman, PA-C .  [START ON 12/02/2016] midodrine (PROAMATINE) tablet 5 mg, 5 mg, Oral, TID WC, Coralee Pesa Wertman, PA-C .  mometasone-formoterol (DULERA) 100-5 MCG/ACT inhaler 2 puff, 2 puff, Inhalation, BID, Rondel Jumbo, PA-C .  niacin (NIASPAN) CR tablet 500 mg, 500 mg, Oral, QHS, Sara E Wertman, PA-C .  ondansetron (ZOFRAN) tablet 4 mg, 4 mg, Oral, Q6H PRN **OR** ondansetron (ZOFRAN) injection 4 mg, 4 mg, Intravenous, Q6H PRN, Rondel Jumbo, PA-C .  piperacillin-tazobactam (ZOSYN) IVPB 3.375 g, 3.375 g, Intravenous, Q8H, Rachel L Rumbarger, RPH .  senna-docusate (Senokot-S) tablet 1 tablet, 1 tablet, Oral, QHS PRN, Rondel Jumbo, PA-C .  [COMPLETED] sodium chloride 0.9 % bolus 1,000 mL, 1,000 mL, Intravenous, Once, Stopped at 12/01/16 1549 **AND** [COMPLETED] sodium chloride 0.9 % bolus 1,000 mL, 1,000 mL, Intravenous, Once, Stopped at 12/01/16 1549 **AND** sodium chloride 0.9 % bolus 1,000 mL, 1,000 mL, Intravenous, Once, Doristine Devoid, PA-C .  sodium chloride flush (NS) 0.9 % injection 3 mL, 3 mL, Intravenous, Q12H, Coralee Pesa Wertman, PA-C .  tamsulosin (FLOMAX) capsule 0.4 mg, 0.4 mg, Oral, QHS, Rondel Jumbo,  PA-C .  Tbo-Filgrastim (GRANIX) injection 480 mcg, 480 mcg, Subcutaneous, Once, Chrissie Noa T Leaphart, PA-C .  tiotropium [facility]) inhalation capsule 18 mcg, 18 mcg, Inhalation, Daily, Rondel Jumbo, PA-C .  traZODone (DESYREL) tablet 25 mg, 25 mg, Oral, QHS PRN, Rondel Jumbo, PA-C .  Derrill Memo ON 12/02/2016] vancomycin (VANCOCIN) 1,250 mg in sodium chloride 0.9 % 250 mL IVPB, 1,250 mg, Intravenous, Q24H, Valeda Malm Rumbarger, RPH  Current Outpatient Prescriptions:  .  acetaminophen (TYLENOL) 500 MG tablet, Take 1,000 mg by mouth every 6 (six) hours as needed., Disp: , Rfl:  .  albuterol (PROVENTIL HFA;VENTOLIN HFA) 108 (90 BASE) MCG/ACT inhaler, Inhale 2 puffs into the lungs every 4 (four) hours as needed for wheezing or shortness of breath., Disp: 1 Inhaler, Rfl: 6 .  apixaban (ELIQUIS) 5 MG TABS tablet, Take 1 tablet (5 mg total) by mouth every 12 (twelve) hours., Disp: 180 tablet, Rfl: 1 .  atorvastatin (LIPITOR) 40 MG tablet, TAKE 1 TABLET (40 MG TOTAL) BY MOUTH EVERY EVENING., Disp: 90 tablet, Rfl: 4 .  diphenhydramine-acetaminophen (TYLENOL  PM) 25-500 MG TABS, Take 2 tablets by mouth at bedtime. , Disp: , Rfl:  .  ENSURE (ENSURE), Take 1 Can by mouth every morning., Disp: , Rfl:  .  metoprolol (LOPRESSOR) 50 MG tablet, Take 1.5 tablets (75 mg total) by mouth 2 (two) times daily., Disp: 270 tablet, Rfl: 3 .  midodrine (PROAMATINE) 5 MG tablet, TAKE 1 TABLET (5 MG TOTAL) BY MOUTH 3 (THREE) TIMES DAILY., Disp: 270 tablet, Rfl: 4 .  niacin (NIASPAN) 500 MG CR tablet, TAKE 1 TABLET (500 MG TOTAL) BY MOUTH AT BEDTIME., Disp: 90 tablet, Rfl: 3 .  Probiotic Product ([employer] SENIOR PROBIOTIC PO), Take 1 capsule by mouth at bedtime. Once daily, Disp: , Rfl:  .  SYMBICORT 80-4.5 MCG/ACT inhaler, INHALE 2 PUFFS INTO THE LUNGS 2 (TWO) TIMES DAILY., Disp: 30.6 Inhaler, Rfl: 1 .  Tamsulosin HCl (FLOMAX) 0.4 MG CAPS, Take 0.4 mg by mouth at bedtime. , Disp: , Rfl:  .  tiotropium (SPIRIVA HANDIHALER) 18 MCG  inhalation capsule, PLACE 1 CAPSULE (18 MCG TOTAL) INTO INHALER AND INHALE EVERY MORNING., Disp: 30 capsule, Rfl: 2:  . apixaban  5 mg Oral Q12H  . atorvastatin  40 mg Oral q1800  . [START ON 12/02/2016] metoprolol  75 mg Oral BID  . [START ON 12/02/2016] midodrine  5 mg Oral TID WC  . mometasone-formoterol  2 puff Inhalation BID  . niacin  500 mg Oral QHS  . sodium chloride flush  3 mL Intravenous Q12H  . tamsulosin  0.4 mg Oral QHS  . Tbo-Filgrastim  480 mcg Subcutaneous Once  . tiotropium  18 mcg Inhalation Daily  :  No Known Allergies:  Family History  Problem Relation Age of Onset  . Tuberculosis Father   . Heart attack Father   . Heart disease Mother   . Heart attack Mother   . Heart disease Brother   . Emphysema Daughter     premature without tobacco exposure  :  Social History   Social History  . Marital status: Married    Spouse name: N/A  . Number of children: N/A  . Years of education: N/A   Occupational History  . retired long distance Administrator   . maintenence    Social History Main Topics  . Smoking status: Former Smoker    Packs/day: 1.00    Years: 47.00    Types: Cigarettes    Start date: 12/02/1951    Quit date: 12/30/1993  . Smokeless tobacco: Never Used     Comment: quit 20 years ago  . Alcohol use No  . Drug use: No  . Sexual activity: Not on file   Other Topics Concern  . Not on file   Social History Narrative  . No narrative on file  :  Pertinent items are noted in HPI.  Exam: Patient Vitals for the past 24 hrs:  BP Temp Temp src Pulse Resp SpO2 Weight  12/01/16 1611 129/63 - - 68 - 96 % -  12/01/16 1515 141/70 (!) 104.2 F (40.1 C) Core - 26 - -  12/01/16 1500 156/84 - - (!) 143 - 97 % -  12/01/16 1430 120/76 - - (!) 138 25 94 % -  12/01/16 1418 - - - - - - 192 lb (87.1 kg)  12/01/16 1415 125/69 - - (!) 139 18 92 % -  12/01/16 1407 - (!) 104.6 F (40.3 C) Rectal - - - -  12/01/16 1402 - - - - - (!)  88 % -    As above     Recent Labs  12/01/16 1414  WBC 0.8*  HGB 9.8*  HCT 30.9*  PLT 115*    Recent Labs  12/01/16 1414  NA 139  K 4.2  CL 106  CO2 23  GLUCOSE 120*  BUN 35*  CREATININE 1.64*  CALCIUM 9.0    Blood smear review:  None  Pathology: None     Assessment and Plan:  [name] is a 82 year old white male. He has urosepsis. I think this is clearly suppressing his bone marrow. Chest 3-4 weeks ago, his blood counts were normal.  Is not a common to see bone marrow suppression in someone who is elderly with a gram-negative ride infection. I would have to assume that this probably is a gram-negative rod urinary tract infection.  I would clearly would get him on Neupogen. I think this would be helpful to get his immune system a little bit better.  It is possible that his blood counts might worsen before they get better. I don't think needs to be transfused.  We will ask give him Aranesp if necessary. His erythropoietin level back in November was 40. This might be a little on the low side for his anemia. He does have renal insufficiency so his anemia might need a little bit of help to improve.  I don't see that he needs a bone marrow test. Again, 4 weeks ago, his blood counts were okay. His MGUS numbers were stable back in November. I don't see that he has transformed to myeloma.  We will follow along. Again, Neupogen I think would be quite helpful.  I'm sure urology will see him because the kidney stones and may be because the prostate enlargement. Maybe has some bladder outlet obstruction issues.  I appreciate everybody's help. As always, the staff in the emergency room has done a fantastic job with his workup.  Lattie Haw, MD  Psalm 56:3

## 2016-12-01 NOTE — ED Triage Notes (Signed)
[place] bystanders called EMS rt pt having a suspected seizure. Bystanders state pt was shaking. Pt states he has been unable to have a BM or urinate x2 weeks. PT states he has also been having SOB for the same amount of time. Pt is labored upon arrival and hot to touch.

## 2016-12-02 ENCOUNTER — Inpatient Hospital Stay (HOSPITAL_COMMUNITY): Payer: Medicare Other

## 2016-12-02 ENCOUNTER — Other Ambulatory Visit: Payer: Self-pay | Admitting: Emergency Medicine

## 2016-12-02 DIAGNOSIS — N400 Enlarged prostate without lower urinary tract symptoms: Secondary | ICD-10-CM

## 2016-12-02 DIAGNOSIS — N189 Chronic kidney disease, unspecified: Secondary | ICD-10-CM

## 2016-12-02 LAB — COMPREHENSIVE METABOLIC PANEL
ALBUMIN: 2.9 g/dL — AB (ref 3.5–5.0)
ALK PHOS: 190 U/L — AB (ref 38–126)
ALT: 326 U/L — AB (ref 17–63)
AST: 336 U/L — ABNORMAL HIGH (ref 15–41)
Anion gap: 11 (ref 5–15)
BUN: 35 mg/dL — ABNORMAL HIGH (ref 6–20)
CALCIUM: 8.1 mg/dL — AB (ref 8.9–10.3)
CO2: 20 mmol/L — AB (ref 22–32)
CREATININE: 1.87 mg/dL — AB (ref 0.61–1.24)
Chloride: 106 mmol/L (ref 101–111)
GFR calc Af Amer: 36 mL/min — ABNORMAL LOW (ref >60)
GFR calc non Af Amer: 31 mL/min — ABNORMAL LOW (ref >60)
GLUCOSE: 121 mg/dL — AB (ref 65–99)
Potassium: 4.6 mmol/L (ref 3.5–5.1)
SODIUM: 137 mmol/L (ref 135–145)
Total Bilirubin: 2.2 mg/dL — ABNORMAL HIGH (ref 0.3–1.2)
Total Protein: 6.1 g/dL — ABNORMAL LOW (ref 6.5–8.1)

## 2016-12-02 LAB — BLOOD CULTURE ID PANEL (REFLEXED)
ACINETOBACTER BAUMANNII: NOT DETECTED
CANDIDA ALBICANS: NOT DETECTED
CANDIDA GLABRATA: NOT DETECTED
CANDIDA KRUSEI: NOT DETECTED
CANDIDA TROPICALIS: NOT DETECTED
Candida parapsilosis: NOT DETECTED
Carbapenem resistance: NOT DETECTED
ENTEROBACTER CLOACAE COMPLEX: NOT DETECTED
ESCHERICHIA COLI: NOT DETECTED
Enterobacteriaceae species: DETECTED — AB
Enterococcus species: NOT DETECTED
Haemophilus influenzae: NOT DETECTED
KLEBSIELLA OXYTOCA: DETECTED — AB
Klebsiella pneumoniae: NOT DETECTED
Listeria monocytogenes: NOT DETECTED
NEISSERIA MENINGITIDIS: NOT DETECTED
PROTEUS SPECIES: NOT DETECTED
Pseudomonas aeruginosa: NOT DETECTED
SERRATIA MARCESCENS: NOT DETECTED
STREPTOCOCCUS AGALACTIAE: NOT DETECTED
STREPTOCOCCUS PNEUMONIAE: NOT DETECTED
Staphylococcus aureus (BCID): NOT DETECTED
Staphylococcus species: NOT DETECTED
Streptococcus pyogenes: NOT DETECTED
Streptococcus species: NOT DETECTED

## 2016-12-02 LAB — MRSA PCR SCREENING: MRSA BY PCR: NEGATIVE

## 2016-12-02 LAB — CBC
HCT: 27.8 % — ABNORMAL LOW (ref 39.0–52.0)
Hemoglobin: 9 g/dL — ABNORMAL LOW (ref 13.0–17.0)
MCH: 32.1 pg (ref 26.0–34.0)
MCHC: 32.4 g/dL (ref 30.0–36.0)
MCV: 99.3 fL (ref 78.0–100.0)
PLATELETS: 97 10*3/uL — AB (ref 150–400)
RBC: 2.8 MIL/uL — ABNORMAL LOW (ref 4.22–5.81)
RDW: 13.8 % (ref 11.5–15.5)
WBC: 17.7 10*3/uL — ABNORMAL HIGH (ref 4.0–10.5)

## 2016-12-02 LAB — PROTIME-INR
INR: 1.9
PROTHROMBIN TIME: 22.1 s — AB (ref 11.4–15.2)

## 2016-12-02 LAB — PATHOLOGIST SMEAR REVIEW

## 2016-12-02 LAB — LACTIC ACID, PLASMA
LACTIC ACID, VENOUS: 1.9 mmol/L (ref 0.5–1.9)
Lactic Acid, Venous: 2.6 mmol/L (ref 0.5–1.9)

## 2016-12-02 LAB — GLUCOSE, CAPILLARY: GLUCOSE-CAPILLARY: 104 mg/dL — AB (ref 65–99)

## 2016-12-02 MED ORDER — PIPERACILLIN-TAZOBACTAM 3.375 G IVPB 30 MIN
3.3750 g | Freq: Once | INTRAVENOUS | Status: AC
  Administered 2016-12-02: 3.375 g via INTRAVENOUS

## 2016-12-02 MED ORDER — DARBEPOETIN ALFA 300 MCG/0.6ML IJ SOSY
300.0000 ug | PREFILLED_SYRINGE | Freq: Once | INTRAMUSCULAR | Status: AC
  Administered 2016-12-02: 300 ug via SUBCUTANEOUS
  Filled 2016-12-02 (×2): qty 0.6

## 2016-12-02 MED ORDER — SODIUM CHLORIDE 0.9 % IV BOLUS (SEPSIS)
250.0000 mL | Freq: Once | INTRAVENOUS | Status: AC
  Administered 2016-12-02: 250 mL via INTRAVENOUS

## 2016-12-02 MED ORDER — DEXTROSE 5 % IV SOLN
2.0000 g | INTRAVENOUS | Status: DC
  Administered 2016-12-02 – 2016-12-06 (×5): 2 g via INTRAVENOUS
  Filled 2016-12-02 (×6): qty 2

## 2016-12-02 MED ORDER — TRAMADOL HCL 50 MG PO TABS: 50.0000 mg | ORAL_TABLET | Freq: Four times a day (QID) | ORAL | Status: DC | PRN

## 2016-12-02 MED ORDER — POLYETHYLENE GLYCOL 3350 17 G PO PACK
17.0000 g | PACK | Freq: Every day | ORAL | Status: DC
  Administered 2016-12-02 – 2016-12-03 (×2): 17 g via ORAL
  Filled 2016-12-02 (×2): qty 1

## 2016-12-02 MED ORDER — LEVALBUTEROL HCL 0.63 MG/3ML IN NEBU: 0.6300 mg | INHALATION_SOLUTION | RESPIRATORY_TRACT | Status: DC | PRN

## 2016-12-02 MED ORDER — ENSURE ENLIVE PO LIQD
237.0000 mL | Freq: Every day | ORAL | Status: DC
  Administered 2016-12-03 – 2016-12-08 (×5): 237 mL via ORAL

## 2016-12-02 MED ORDER — SENNOSIDES-DOCUSATE SODIUM 8.6-50 MG PO TABS
1.0000 | ORAL_TABLET | Freq: Two times a day (BID) | ORAL | Status: DC
  Administered 2016-12-02 – 2016-12-03 (×3): 1 via ORAL
  Filled 2016-12-02 (×4): qty 1

## 2016-12-02 MED ORDER — SODIUM CHLORIDE 0.9 % IV SOLN
510.0000 mg | Freq: Once | INTRAVENOUS | Status: AC
  Administered 2016-12-02: 510 mg via INTRAVENOUS
  Filled 2016-12-02: qty 17

## 2016-12-02 MED ORDER — OXYCODONE HCL 5 MG PO TABS
5.0000 mg | ORAL_TABLET | ORAL | Status: DC | PRN
  Administered 2016-12-02 – 2016-12-03 (×2): 10 mg via ORAL
  Filled 2016-12-02 (×3): qty 2

## 2016-12-02 MED ORDER — ALBUTEROL SULFATE (2.5 MG/3ML) 0.083% IN NEBU: 2.5000 mg | INHALATION_SOLUTION | RESPIRATORY_TRACT | Status: DC | PRN

## 2016-12-02 NOTE — Progress Notes (Signed)
Paged Dr. Thereasa Solo, patient Lactic Acid 2.6.  No change from 12/3.

## 2016-12-02 NOTE — Evaluation (Signed)
Physical Therapy Evaluation Patient Details Name: [name] MRN: [id number] DOB: Sep 25, 1929 Today's Date: 12/02/2016   History of Present Illness  82 yo admitted with UTI and sepsis. PMhx: CAD, CABG, pacemaker, anemia, COPD, emphysema  Clinical Impression  Patient presents with decreased mobility and decreased activity tolerance and would benefit from physical therapy to progress mobility and maximize function within his home. Patient was pleasant and told PT how he would like to return to being active. Patient was not able to tolerate sitting EOB today due to unstable vitals, but recovered quickly upon return to bed. Patient performed exercises in bed without difficulty. Will continue to follow.   HR rest: 117 HR EOB: 180, recovered quickly upon return to supine BP rest: 137/73      Follow Up Recommendations Home health PT;Supervision/Assistance - 24 hour    Equipment Recommendations  Rolling walker with 5" wheels;3in1 (PT)    Recommendations for Other Services OT consult     Precautions / Restrictions Precautions Precautions: Fall Precaution Comments: watch HR      Mobility  Bed Mobility Overal bed mobility: Needs Assistance Bed Mobility: Supine to Sit;Sit to Supine     Supine to sit: Min assist Sit to supine: Min assist   General bed mobility comments: increased time with cues and min assist to elevate trunk to sit and min assist to lift legs to return to supine. Pt HR jumped to 180 with transition to sitting and immediately returned to supine with HR down to 144 and progressive decline in supine  Transfers                 General transfer comment: unable to attempt medically at this time  Ambulation/Gait                Stairs            Wheelchair Mobility    Modified Rankin (Stroke Patients Only)       Balance Overall balance assessment: Needs assistance Sitting-balance support: Bilateral upper extremity supported;Feet  supported Sitting balance-Leahy Scale: Fair                                       Pertinent Vitals/Pain Pain Assessment: 0-10 Pain Score: 3  Pain Location: right knee Pain Descriptors / Indicators: Sore Pain Intervention(s): Limited activity within patient's tolerance    Home Living Family/patient expects to be discharged to:: Private residence Living Arrangements: Children Available Help at Discharge: Family;Available PRN/intermittently Type of Home: House Home Access: Stairs to enter Entrance Stairs-Rails: Psychiatric nurse of Steps: 3 Home Layout: One level Home Equipment: Marine scientist - single point      Prior Function Level of Independence: Independent         Comments: Daughter does all the homemaking     Hand Dominance        Extremity/Trunk Assessment   Upper Extremity Assessment: Overall WFL for tasks assessed           Lower Extremity Assessment: Overall WFL for tasks assessed         Communication   Communication: HOH  Cognition Arousal/Alertness: Awake/alert Behavior During Therapy: WFL for tasks assessed/performed Overall Cognitive Status: Within Functional Limits for tasks assessed                      General Comments      Exercises General Exercises -  Lower Extremity Short Arc Quad: AROM;Both;Supine;10 reps Heel Slides: AROM;Both;Supine;10 reps Hip ABduction/ADduction: AROM;Both;10 reps;Supine   Assessment/Plan    PT Assessment Patient needs continued PT services  PT Problem List Decreased mobility;Cardiopulmonary status limiting activity;Decreased activity tolerance          PT Treatment Interventions Gait training;Functional mobility training;Stair training;Therapeutic exercise;DME instruction;Therapeutic activities;Patient/family education    PT Goals (Current goals can be found in the Care Plan section)  Acute Rehab PT Goals Patient Stated Goal: return to the senior games PT  Goal Formulation: With patient Time For Goal Achievement: 12/16/16 Potential to Achieve Goals: Good    Frequency Min 3X/week   Barriers to discharge Decreased caregiver support      Co-evaluation               End of Session Equipment Utilized During Treatment: Oxygen Activity Tolerance: Treatment limited secondary to medical complications (Comment) Patient left: in bed;with call bell/phone within reach;with bed alarm set Nurse Communication: Mobility status         Time: [id number] PT Time Calculation (min) (ACUTE ONLY): 28 min   Charges:   PT Evaluation $PT Eval Moderate Complexity: 1 Procedure     PT G Codes:        Ottie Neglia 26-Dec-2016, 12:01 PM  Shail Urbas SPT [id number]

## 2016-12-02 NOTE — Progress Notes (Signed)
Pt was very constipated, he became very agitated trying to have a bowel movement, HR went into 140's, put pt on BSC to assist with bowel movement, gave suppository and Mag. Called doctor and got order to impact. I did impact some, pt HR still in the 120's, but has calm down some Will continue to follow and notify doctor.

## 2016-12-02 NOTE — Progress Notes (Signed)
Report attempted x 1

## 2016-12-02 NOTE — ED Notes (Signed)
Pt on tele monitor. Ready for transport

## 2016-12-02 NOTE — Progress Notes (Signed)
PHARMACY - PHYSICIAN COMMUNICATION CRITICAL VALUE ALERT - BLOOD CULTURE IDENTIFICATION (BCID)  Results for orders placed or performed during the hospital encounter of 12/01/16  Blood Culture ID Panel (Reflexed) (Collected: 12/01/2016  2:14 PM)  Result Value Ref Range   Enterococcus species NOT DETECTED NOT DETECTED   Listeria monocytogenes NOT DETECTED NOT DETECTED   Staphylococcus species NOT DETECTED NOT DETECTED   Staphylococcus aureus NOT DETECTED NOT DETECTED   Streptococcus species NOT DETECTED NOT DETECTED   Streptococcus agalactiae NOT DETECTED NOT DETECTED   Streptococcus pneumoniae NOT DETECTED NOT DETECTED   Streptococcus pyogenes NOT DETECTED NOT DETECTED   Acinetobacter baumannii NOT DETECTED NOT DETECTED   Enterobacteriaceae species DETECTED (A) NOT DETECTED   Enterobacter cloacae complex NOT DETECTED NOT DETECTED   Escherichia coli NOT DETECTED NOT DETECTED   Klebsiella oxytoca DETECTED (A) NOT DETECTED   Klebsiella pneumoniae NOT DETECTED NOT DETECTED   Proteus species NOT DETECTED NOT DETECTED   Serratia marcescens NOT DETECTED NOT DETECTED   Carbapenem resistance NOT DETECTED NOT DETECTED   Haemophilus influenzae NOT DETECTED NOT DETECTED   Neisseria meningitidis NOT DETECTED NOT DETECTED   Pseudomonas aeruginosa NOT DETECTED NOT DETECTED   Candida albicans NOT DETECTED NOT DETECTED   Candida glabrata NOT DETECTED NOT DETECTED   Candida krusei NOT DETECTED NOT DETECTED   Candida parapsilosis NOT DETECTED NOT DETECTED   Candida tropicalis NOT DETECTED NOT DETECTED    Name of physician (or Provider) Contacted: Dr. Thereasa Solo  Changes to prescribed antibiotics required: none at present; however, would consider deescalating from Zosyn and vancomycin to Ceftriaxone 2 gram IV every 24 hours based on culture results.   Vincenza Hews, PharmD, BCPS 12/02/2016, 7:41 AM

## 2016-12-02 NOTE — Progress Notes (Signed)
As expected, Mr. Roshto has gram-negative rods. Unfortunately, this is in his blood. He has both Enterobacter and Klebsiella in his blood. It is no surprise that he has bone marrow suppression. His white cell counts come up incredibly nicely with Neupogen. Neupogen can be discontinued.  He seems be doing a little bit better.  His iron levels are quite low. His ferritin is markedly elevated because of the infection.  He does have a chronic renal insufficiency.  His hemoglobin is 9.0. I think he probably needs a dose of Aranesp. He also probably would benefit from some iron.  His platelet count is 97,000. Again this is no surprise given that he has gram-negative rods in his blood.  His vital signs are relatively stable. His blood pressure is 130/82. Pulse is 114.  From my point of view, his transient pancytopenia is clearly from the gram-negative rod bacteremia. I have to believe this is all coming from his urine.  I know that he is getting fantastic care in the ICU. I very much appreciate all the compassion that the staff has.  Lattie Haw, MD  Oswaldo Milian 41:10

## 2016-12-02 NOTE — Progress Notes (Signed)
[facility] TEAM 1 - Stepdown/ICU TEAM  [name]  [id number] DOB: [birth year] DOA: 12/01/2016 PCP: Leonides Sake, MD    Brief Narrative:  82 year old with COPD, anemia, MGUS, and CAD who presented w/ a fever of 105, dysuria, and difficulty urinating for 3 days. In the ED Pulse was 130, Temp 104.2 F, and UA was positive.    Subjective: The pt is alert and pleasant.  He denies f/c, n/v, or chest pain.  He does report some SOB, as well as constipation.    Assessment & Plan:  Sepsis due to Klebsiella UTI w/ bacteremia  Narrow abx spectrum and f/u sensitivities - cont volume resuscitation - trend lactate  Leukopenia in the setting of sepsis WBC 0.8 with ANC 0.7 - Heme following - due to gram neg bacteremia    MGUS followed by Dr. Marin Olp - noncontributory   COPD  Well compensated at this time   Acute kidney injury on Chronic kidney disease baseline creatinine 1.2-1.4 - crt climbing - cont to hydrate and follow  Recent Labs Lab 12/01/16 1414 12/01/16 1749 12/02/16 0212  CREATININE 1.64* 1.71* 1.87*    CAD s/p CABG 2006 Asymptomatic at this time   Parox SVT - recurrent A flutter  S/p [place] on chronic eliquis - follow on tele w/ hydration   Anemia of chronic disease and iron deficiency Care per Heme - no indication for transfusion at this time   Transaminitis Mild shock liver - follow w/ volume resuscitation    Hyperlipidemia Continue home statins  Orthostatic Hypotension  On chronic midodrine   DVT prophylaxis: eliquis  Code Status: FULL CODE Family Communication: no family present at time of exam  Disposition Plan: SDU   Consultants:  Heme/Onc  Procedures: none  Antimicrobials:  Zosyn 12/3  Vanc 12/3 Rocephin 12/4 >  Objective: Blood pressure 130/82, pulse (!) 114, temperature 98.2 F (36.8 C), temperature source Oral, resp. rate 17, height 5\' 10"  (1.778 m), weight 84.3 kg (185 lb 13.6 oz), SpO2 98 %.  Intake/Output Summary (Last  24 hours) at 12/02/16 0851 Last data filed at 12/02/16 0600  Gross per 24 hour  Intake              350 ml  Output              368 ml  Net              -18 ml   Filed Weights   12/01/16 1418 12/02/16 0613  Weight: 87.1 kg (192 lb) 84.3 kg (185 lb 13.6 oz)    Examination: General: No acute respiratory distress Lungs: Clear to auscultation bilaterally without wheezes or crackles Cardiovascular: Regular but tachycardiac at 105bpm - no M Abdomen: Nontender, distended, soft, bowel sounds positive, no rebound, no ascites, no appreciable mass Extremities: No significant cyanosis, or clubbing - trace edema bilateral lower extremities  CBC:  Recent Labs Lab 12/01/16 1414 12/01/16 1749 12/02/16 0212  WBC 0.8* 5.1 17.7*  NEUTROABS 0.7* 5.0  --   HGB 9.8* 9.6* 9.0*  HCT 30.9* 29.8* 27.8*  MCV 97.8 99.3 99.3  PLT 115* 97* 97*   Basic Metabolic Panel:  Recent Labs Lab 12/01/16 1414 12/01/16 1749 12/02/16 0212  NA 139 138 137  K 4.2 3.4* 4.6  CL 106 107 106  CO2 23 20* 20*  GLUCOSE 120* 135* 121*  BUN 35* 32* 35*  CREATININE 1.64* 1.71* 1.87*  CALCIUM 9.0 8.2* 8.1*   GFR: Estimated Creatinine Clearance: 29.8 mL/min (by  C-G formula based on SCr of 1.87 mg/dL (H)).  Liver Function Tests:  Recent Labs Lab 12/01/16 1414 12/01/16 1749 12/02/16 0212  AST 370* 368* 336*  ALT 305* 310* 326*  ALKPHOS 201* 207* 190*  BILITOT 1.8* 1.9* 2.2*  PROT 6.9 5.9* 6.1*  ALBUMIN 3.1* 2.6* 2.9*    Coagulation Profile:  Recent Labs Lab 12/01/16 1749 12/02/16 0212  INR 1.86 1.90    HbA1C: Hgb A1c MFr Bld  Date/Time Value Ref Range Status  07/17/2009 01:00 PM  4.6 - 6.1 % Final   5.9 (NOTE) The ADA recommends the following therapeutic goal for glycemic control related to Hgb A1c measurement: Goal of therapy: <6.5 Hgb A1c  Reference: American Diabetes Association: Clinical Practice Recommendations 2010, Diabetes Care, 2010, 33: (Suppl  1).    CBG:  Recent Labs Lab  12/02/16 0608  GLUCAP 104*    Recent Results (from the past 240 hour(s))  Blood Culture (routine x 2)     Status: None (Preliminary result)   Collection Time: 12/01/16  2:14 PM  Result Value Ref Range Status   Specimen Description BLOOD RIGHT ANTECUBITAL  Final   Special Requests BOTTLES DRAWN AEROBIC AND ANAEROBIC 5CC  Final   Culture  Setup Time   Final    IN BOTH AEROBIC AND ANAEROBIC BOTTLES GRAM NEGATIVE RODS CRITICAL RESULT CALLED TO, READ BACK BY AND VERIFIED WITH: R. CLARK,PHARM AT 0710 ON CR:[id number] BY S. YARBROUGH    Culture TOO YOUNG TO READ  Final   Report Status PENDING  Incomplete  Blood Culture (routine x 2)     Status: None (Preliminary result)   Collection Time: 12/01/16  2:14 PM  Result Value Ref Range Status   Specimen Description BLOOD RIGHT ANTECUBITAL  Final   Special Requests BOTTLES DRAWN AEROBIC AND ANAEROBIC 5CC  Final   Culture  Setup Time   Final    IN BOTH AEROBIC AND ANAEROBIC BOTTLES GRAM NEGATIVE RODS CRITICAL RESULT CALLED TO, READ BACK BY AND VERIFIED WITH: R. [place], PHARM AT [place] CR:[id number] BY S. YARBROOUGH    Culture TOO YOUNG TO READ  Final   Report Status PENDING  Incomplete  Blood Culture ID Panel (Reflexed)     Status: Abnormal   Collection Time: 12/01/16  2:14 PM  Result Value Ref Range Status   Enterococcus species NOT DETECTED NOT DETECTED Final   Listeria monocytogenes NOT DETECTED NOT DETECTED Final   Staphylococcus species NOT DETECTED NOT DETECTED Final   Staphylococcus aureus NOT DETECTED NOT DETECTED Final   Streptococcus species NOT DETECTED NOT DETECTED Final   Streptococcus agalactiae NOT DETECTED NOT DETECTED Final   Streptococcus pneumoniae NOT DETECTED NOT DETECTED Final   Streptococcus pyogenes NOT DETECTED NOT DETECTED Final   Acinetobacter baumannii NOT DETECTED NOT DETECTED Final   Enterobacteriaceae species DETECTED (A) NOT DETECTED Final    Comment: CRITICAL RESULT CALLED TO, READ BACK BY AND VERIFIED WITH: R. CLARK,  PHARM AT 0710 ON [id number] BY S. YARBROUGH    Enterobacter cloacae complex NOT DETECTED NOT DETECTED Final   Escherichia coli NOT DETECTED NOT DETECTED Final   Klebsiella oxytoca DETECTED (A) NOT DETECTED Final    Comment: CRITICAL RESULT CALLED TO, READ BACK BY AND VERIFIED WITH: R. CLARK, PHARM AT 0710 ON [id number] BY S. YARBROUGH    Klebsiella pneumoniae NOT DETECTED NOT DETECTED Final   Proteus species NOT DETECTED NOT DETECTED Final   Serratia marcescens NOT DETECTED NOT DETECTED Final   Carbapenem resistance NOT DETECTED  NOT DETECTED Final   Haemophilus influenzae NOT DETECTED NOT DETECTED Final   Neisseria meningitidis NOT DETECTED NOT DETECTED Final   Pseudomonas aeruginosa NOT DETECTED NOT DETECTED Final   Candida albicans NOT DETECTED NOT DETECTED Final   Candida glabrata NOT DETECTED NOT DETECTED Final   Candida krusei NOT DETECTED NOT DETECTED Final   Candida parapsilosis NOT DETECTED NOT DETECTED Final   Candida tropicalis NOT DETECTED NOT DETECTED Final  MRSA PCR Screening     Status: None   Collection Time: 12/02/16  6:07 AM  Result Value Ref Range Status   MRSA by PCR NEGATIVE NEGATIVE Final    Comment:        The GeneXpert MRSA Assay (FDA approved for NASAL specimens only), is one component of a comprehensive MRSA colonization surveillance program. It is not intended to diagnose MRSA infection nor to guide or monitor treatment for MRSA infections.      Scheduled Meds: . apixaban  5 mg Oral Q12H  . atorvastatin  40 mg Oral q1800  . darbepoetin (ARANESP) injection - NON-DIALYSIS  300 mcg Subcutaneous Once  . metoprolol  75 mg Oral BID  . midodrine  5 mg Oral TID WC  . mometasone-formoterol  2 puff Inhalation BID  . niacin  500 mg Oral QHS  . piperacillin-tazobactam (ZOSYN)  IV  3.375 g Intravenous Q8H  . sodium chloride  1,000 mL Intravenous Once  . sodium chloride flush  3 mL Intravenous Q12H  . tamsulosin  0.4 mg Oral QHS  . tiotropium  18 mcg Inhalation  Daily  . vancomycin  1,250 mg Intravenous Q24H   Continuous Infusions: . sodium chloride 100 mL/hr at 12/02/16 0511     LOS: 1 day   Cherene Altes, MD [facility] Office  [phone] Pager - Text Page per Shea Evans as per below:  On-Call/Text Page:      [email or web address]      password [email or web address]  If 7PM-7AM, please contact night-coverage [email or web address] Password [email or web address] 12/02/2016, 8:51 AM

## 2016-12-03 ENCOUNTER — Inpatient Hospital Stay (HOSPITAL_COMMUNITY): Payer: Medicare Other

## 2016-12-03 DIAGNOSIS — N189 Chronic kidney disease, unspecified: Secondary | ICD-10-CM

## 2016-12-03 DIAGNOSIS — J431 Panlobular emphysema: Secondary | ICD-10-CM

## 2016-12-03 DIAGNOSIS — I4892 Unspecified atrial flutter: Secondary | ICD-10-CM

## 2016-12-03 DIAGNOSIS — N170 Acute kidney failure with tubular necrosis: Secondary | ICD-10-CM

## 2016-12-03 DIAGNOSIS — D472 Monoclonal gammopathy: Secondary | ICD-10-CM

## 2016-12-03 DIAGNOSIS — E784 Other hyperlipidemia: Secondary | ICD-10-CM

## 2016-12-03 DIAGNOSIS — A419 Sepsis, unspecified organism: Secondary | ICD-10-CM

## 2016-12-03 DIAGNOSIS — E7849 Other hyperlipidemia: Secondary | ICD-10-CM

## 2016-12-03 DIAGNOSIS — B9689 Other specified bacterial agents as the cause of diseases classified elsewhere: Secondary | ICD-10-CM

## 2016-12-03 DIAGNOSIS — N390 Urinary tract infection, site not specified: Secondary | ICD-10-CM

## 2016-12-03 DIAGNOSIS — R7881 Bacteremia: Secondary | ICD-10-CM

## 2016-12-03 LAB — COMPREHENSIVE METABOLIC PANEL
ALBUMIN: 2.7 g/dL — AB (ref 3.5–5.0)
ALT: 243 U/L — AB (ref 17–63)
AST: 171 U/L — AB (ref 15–41)
Alkaline Phosphatase: 215 U/L — ABNORMAL HIGH (ref 38–126)
Anion gap: 10 (ref 5–15)
BUN: 36 mg/dL — AB (ref 6–20)
CHLORIDE: 105 mmol/L (ref 101–111)
CO2: 23 mmol/L (ref 22–32)
CREATININE: 1.36 mg/dL — AB (ref 0.61–1.24)
Calcium: 8.8 mg/dL — ABNORMAL LOW (ref 8.9–10.3)
GFR calc Af Amer: 53 mL/min — ABNORMAL LOW (ref >60)
GFR, EST NON AFRICAN AMERICAN: 46 mL/min — AB (ref >60)
GLUCOSE: 127 mg/dL — AB (ref 65–99)
Potassium: 4.3 mmol/L (ref 3.5–5.1)
Sodium: 138 mmol/L (ref 135–145)
Total Bilirubin: 1.3 mg/dL — ABNORMAL HIGH (ref 0.3–1.2)
Total Protein: 6.3 g/dL — ABNORMAL LOW (ref 6.5–8.1)

## 2016-12-03 LAB — URINE CULTURE: Culture: >=100000 — AB

## 2016-12-03 LAB — CBC
HCT: 28.5 % — ABNORMAL LOW (ref 39.0–52.0)
Hemoglobin: 9.1 g/dL — ABNORMAL LOW (ref 13.0–17.0)
MCH: 31.7 pg (ref 26.0–34.0)
MCHC: 31.9 g/dL (ref 30.0–36.0)
MCV: 99.3 fL (ref 78.0–100.0)
PLATELETS: 98 10*3/uL — AB (ref 150–400)
RBC: 2.87 MIL/uL — ABNORMAL LOW (ref 4.22–5.81)
RDW: 13.9 % (ref 11.5–15.5)
WBC: 19.9 10*3/uL — AB (ref 4.0–10.5)

## 2016-12-03 MED ORDER — FLEET ENEMA 7-19 GM/118ML RE ENEM
1.0000 | ENEMA | Freq: Once | RECTAL | Status: DC
  Filled 2016-12-03: qty 1

## 2016-12-03 NOTE — Progress Notes (Signed)
PROGRESS NOTE    [name]  [id number] DOB: 04-01-1929 DOA: 12/01/2016 PCP: Leonides Sake, MD   Brief Narrative:  82 y.o. WM PMHx COPD, Emphysema, Anemia, MGUS, CAD native artery,Dysrhythmia (SVT vs A- flutter) Orthostatic Hypotension, Cardiomyopathy, Pulmonary Hypertension, S/P CABG Nephrolithiasis,Anemia of renal disease, Anemia, iron deficiency  Presenting to the emergency department today with fever up to 105, and rigors. 3rd patient report, symptoms began about 2 days ago, when he was increasingly constipated, and unable to urinate properly for about 3 days. The patient is passing status, but feels bloated. He denies any abdominal pain. He denies any increasing shortness of breath, nausea vomiting, food poisoning, or sick contacts. He denies any recent travels. Denies any back pain. He denies any lower extremity swelling. His vaccinations are up to date   Subjective: 12/5  A/O 4, abdominal discomfort, negative N/V. Negative CP, negative SOB.   Assessment & Plan:   Active Problems:   MGUS (monoclonal gammopathy of unknown significance)   Chronic anticoagulation   UTI (urinary tract infection)   Lactic acidosis   Acute respiratory failure with hypoxia (HCC)   Acute-on-chronic kidney injury ([place])   Sepsis (HCC)   Sepsis UTI  positive KLEBSIELLA OXYTOCA resistant ampicillin/cefazolin -Patient meets criteria given tachycardia, tachypnea, fever up to 105 , neutropenia with ANC 0. 7   -Continue current antibiotics -Oncology on board  Dr. Marin Olp  -Lactic acid normal  Sepsis bacteremia positive KLEBSIELLA OXYTOCA  Leukopenia in the setting of sepsis.  Current WBC 0.8 with ANC 0.7  Cultures pending. EDP spoke with Oncologist on call, who recommended Granix, possible consult is pending as Pharmacist does not provide GSF in ED  Continue IV antibiotics as above    MGUS, followed  -by Dr. Marin Olp as OP Has been notified of patient's admission. Platelets 115,  monitor closely  May hold Heparin if PLts drop to less than 70k  M-spike was 0.5g/dL, IgG level was 1,125mg /dLand lamba light chain was 12.79mg /dL.   COPD  Continue nebs and O2  Continue Antibiotics as above   Acute on chronic kidney failure Baseline creatinine 1.2-1.4      Lab Results  Component Value Date   CREATININE 1.36 (H) 12/03/2016   CREATININE 1.87 (H) 12/02/2016   CREATININE 1.71 (H) 12/01/2016  -Baseline   CAD s/p CABG,  - EKG  Paced ST without ACS, QTC 556,  Troponin 0.04  , patient is cardiac pain free at this time. -Continue meds   Parox SVT - recurrent A flutter  S/p [place] on chronic eliquis - follow on tele w/ hydration   Anemia of chronic disease and iron deficiency   -Hemoglobin on admission 9.8 , no bleeding issues noted  -Transfuse 1 unit packed red blood cells if Hb less than 8 or acutely bleeding  Abnormal LFTs.  -CT a renal with multiple calculi . No history of  hepatitis per chart   -Continue hydration and monitor closely  Will consider Hepatitis panel Repeat LFTs   Constipation vs SBO -KUB pending   Hyperlipidemia Continue home statins  Orthostatic Hypotension  -Resolved   DVT prophylaxis:Eliquis Code Status: Full Family Communication: None Disposition Plan: ??   Consultants:  Oncology Urology   Procedures/Significant Events:  NA   VENTILATOR SETTINGS: NA   Cultures 12/3 blood right AC positive KLEBSIELLA OXYTOCA 12/3 urine positive KLEBSIELLA OXYTOCA resistant ampicillin/cefazolin 12/3 blood 2 NGTD 12/4 MRSA by PCR negative   Antimicrobials: Ceftriaxone 12/>> Zosyn 12/3>> 12/4 Vancomycin 12/3>> 12/4   Devices NA  LINES / TUBES:  NA    Continuous Infusions: . sodium chloride 50 mL/hr at 12/02/16 1839     Objective: Vitals:   12/02/16 2300 12/02/16 2305 12/03/16 0330 12/03/16 0810  BP: 130/74 130/74 134/88 140/85  Pulse: (!) 122 (!) 122 (!) 117 (!) 118  Resp: (!) 21 (!) 34 (!) 21 (!) 26    Temp:  98.4 F (36.9 C) 97.6 F (36.4 C) 97.8 F (36.6 C)  TempSrc:  Oral Oral Oral  SpO2: 94% 95% 96% 97%  Weight:      Height:        Intake/Output Summary (Last 24 hours) at 12/03/16 [id number] Last data filed at 12/03/16 0355  Gross per 24 hour  Intake           2082.5 ml  Output              850 ml  Net           1232.5 ml   Filed Weights   12/01/16 1418 12/02/16 [id number]  Weight: 87.1 kg (192 lb) 84.3 kg (185 lb 13.6 oz)    Examination:  General: A/O 4, abdominal discomfort, No acute respiratory distress Eyes: negative scleral hemorrhage, negative anisocoria, negative icterus ENT: Negative Runny nose, negative gingival bleeding, Neck:  Negative scars, masses, torticollis, lymphadenopathy, JVD Lungs: Clear to auscultation bilaterally without wheezes or crackles Cardiovascular: Regular rate and rhythm without murmur gallop or rub normal S1 and S2 Abdomen: Positive abdominal discomfort, positive distention, positive firm to palpation, positive bowel sounds, no rebound, no ascites, no appreciable mass Extremities: No significant cyanosis, clubbing, or edema bilateral lower extremities Skin: Negative rashes, lesions, ulcers Psychiatric:  Negative depression, negative anxiety, negative fatigue, negative mania  Central nervous system:  Cranial nerves II through XII intact, tongue/uvula midline, all extremities muscle strength 5/5, sensation intact throughout, negative dysarthria, negative expressive aphasia, negative receptive aphasia.  .     Data Reviewed: Care during the described time interval was provided by me .  I have reviewed this patient's available data, including medical history, events of note, physical examination, and all test results as part of my evaluation. I have personally reviewed and interpreted all radiology studies.  CBC:  Recent Labs Lab 12/01/16 1414 12/01/16 1749 12/02/16 0212 12/03/16 0325  WBC 0.8* 5.1 17.7* 19.9*  NEUTROABS 0.7* 5.0  --   --    HGB 9.8* 9.6* 9.0* 9.1*  HCT 30.9* 29.8* 27.8* 28.5*  MCV 97.8 99.3 99.3 99.3  PLT 115* 97* 97* 98*   Basic Metabolic Panel:  Recent Labs Lab 12/01/16 1414 12/01/16 1749 12/02/16 0212 12/03/16 0325  NA 139 138 137 138  K 4.2 3.4* 4.6 4.3  CL 106 107 106 105  CO2 23 20* 20* 23  GLUCOSE 120* 135* 121* 127*  BUN 35* 32* 35* 36*  CREATININE 1.64* 1.71* 1.87* 1.36*  CALCIUM 9.0 8.2* 8.1* 8.8*   GFR: Estimated Creatinine Clearance: 41 mL/min (by C-G formula based on SCr of 1.36 mg/dL (H)). Liver Function Tests:  Recent Labs Lab 12/01/16 1414 12/01/16 1749 12/02/16 0212 12/03/16 0325  AST 370* 368* 336* 171*  ALT 305* 310* 326* 243*  ALKPHOS 201* 207* 190* 215*  BILITOT 1.8* 1.9* 2.2* 1.3*  PROT 6.9 5.9* 6.1* 6.3*  ALBUMIN 3.1* 2.6* 2.9* 2.7*   No results for input(s): LIPASE, AMYLASE in the last 168 hours. No results for input(s): AMMONIA in the last 168 hours. Coagulation Profile:  Recent Labs Lab 12/01/16 1749 12/02/16 [id number]  INR 1.86 1.90   Cardiac Enzymes: No results for input(s): CKTOTAL, CKMB, CKMBINDEX, TROPONINI in the last 168 hours. BNP (last 3 results) No results for input(s): PROBNP in the last 8760 hours. HbA1C: No results for input(s): HGBA1C in the last 72 hours. CBG:  Recent Labs Lab 12/02/16 0608  GLUCAP 104*   Lipid Profile: No results for input(s): CHOL, HDL, LDLCALC, TRIG, CHOLHDL, LDLDIRECT in the last 72 hours. Thyroid Function Tests: No results for input(s): TSH, T4TOTAL, FREET4, T3FREE, THYROIDAB in the last 72 hours. Anemia Panel:  Recent Labs  12/01/16 1749  FERRITIN 3,420*  TIBC 225*  IRON 33*   Urine analysis:    Component Value Date/Time   COLORURINE AMBER (A) 12/01/2016 1428   APPEARANCEUR TURBID (A) 12/01/2016 1428   LABSPEC 1.024 12/01/2016 1428   PHURINE 5.5 12/01/2016 1428   GLUCOSEU NEGATIVE 12/01/2016 1428   HGBUR MODERATE (A) 12/01/2016 1428   BILIRUBINUR NEGATIVE 12/01/2016 1428   KETONESUR NEGATIVE  12/01/2016 1428   PROTEINUR 100 (A) 12/01/2016 1428   UROBILINOGEN 0.2 01/01/2011 1624   NITRITE POSITIVE (A) 12/01/2016 1428   LEUKOCYTESUR LARGE (A) 12/01/2016 1428   Sepsis Labs: @LABRCNTIP (procalcitonin:4,lacticidven:4)  ) Recent Results (from the past 240 hour(s))  Blood Culture (routine x 2)     Status: None (Preliminary result)   Collection Time: 12/01/16  2:14 PM  Result Value Ref Range Status   Specimen Description BLOOD RIGHT ANTECUBITAL  Final   Special Requests BOTTLES DRAWN AEROBIC AND ANAEROBIC 5CC  Final   Culture  Setup Time   Final    IN BOTH AEROBIC AND ANAEROBIC BOTTLES GRAM NEGATIVE RODS CRITICAL RESULT CALLED TO, READ BACK BY AND VERIFIED WITH: R. CLARK,PHARM AT [id number] ON [id number] BY S. YARBROUGH    Culture TOO YOUNG TO READ  Final   Report Status PENDING  Incomplete  Blood Culture (routine x 2)     Status: None (Preliminary result)   Collection Time: 12/01/16  2:14 PM  Result Value Ref Range Status   Specimen Description BLOOD RIGHT ANTECUBITAL  Final   Special Requests BOTTLES DRAWN AEROBIC AND ANAEROBIC 5CC  Final   Culture  Setup Time   Final    IN BOTH AEROBIC AND ANAEROBIC BOTTLES GRAM NEGATIVE RODS CRITICAL RESULT CALLED TO, READ BACK BY AND VERIFIED WITH: R. CLARK, PHARM AT 0710 ON [id number] BY S. YARBROOUGH    Culture TOO YOUNG TO READ  Final   Report Status PENDING  Incomplete  Blood Culture ID Panel (Reflexed)     Status: Abnormal   Collection Time: 12/01/16  2:14 PM  Result Value Ref Range Status   Enterococcus species NOT DETECTED NOT DETECTED Final   Listeria monocytogenes NOT DETECTED NOT DETECTED Final   Staphylococcus species NOT DETECTED NOT DETECTED Final   Staphylococcus aureus NOT DETECTED NOT DETECTED Final   Streptococcus species NOT DETECTED NOT DETECTED Final   Streptococcus agalactiae NOT DETECTED NOT DETECTED Final   Streptococcus pneumoniae NOT DETECTED NOT DETECTED Final   Streptococcus pyogenes NOT DETECTED NOT DETECTED Final    Acinetobacter baumannii NOT DETECTED NOT DETECTED Final   Enterobacteriaceae species DETECTED (A) NOT DETECTED Final    Comment: CRITICAL RESULT CALLED TO, READ BACK BY AND VERIFIED WITH: R. CLARK, PHARM AT 0710 ON [id number] BY S. YARBROUGH    Enterobacter cloacae complex NOT DETECTED NOT DETECTED Final   Escherichia coli NOT DETECTED NOT DETECTED Final   Klebsiella oxytoca DETECTED (A) NOT DETECTED Final    Comment: CRITICAL RESULT  CALLED TO, READ BACK BY AND VERIFIED WITH: R. CLARK, PHARM AT 0710 ON [id number] BY S. YARBROUGH    Klebsiella pneumoniae NOT DETECTED NOT DETECTED Final   Proteus species NOT DETECTED NOT DETECTED Final   Serratia marcescens NOT DETECTED NOT DETECTED Final   Carbapenem resistance NOT DETECTED NOT DETECTED Final   Haemophilus influenzae NOT DETECTED NOT DETECTED Final   Neisseria meningitidis NOT DETECTED NOT DETECTED Final   Pseudomonas aeruginosa NOT DETECTED NOT DETECTED Final   Candida albicans NOT DETECTED NOT DETECTED Final   Candida glabrata NOT DETECTED NOT DETECTED Final   Candida krusei NOT DETECTED NOT DETECTED Final   Candida parapsilosis NOT DETECTED NOT DETECTED Final   Candida tropicalis NOT DETECTED NOT DETECTED Final  Urine culture     Status: Abnormal (Preliminary result)   Collection Time: 12/01/16  2:28 PM  Result Value Ref Range Status   Specimen Description URINE, RANDOM  Final   Special Requests NONE  Final   Culture >=100,000 COLONIES/mL KLEBSIELLA OXYTOCA (A)  Final   Report Status PENDING  Incomplete  Culture, blood (x 2)     Status: None (Preliminary result)   Collection Time: 12/01/16  6:00 PM  Result Value Ref Range Status   Specimen Description BLOOD RIGHT ANTECUBITAL  Final   Special Requests   Final    BOTTLES DRAWN AEROBIC AND ANAEROBIC 10CC AER 5CC ANA   Culture NO GROWTH < 24 HOURS  Final   Report Status PENDING  Incomplete  Culture, blood (x 2)     Status: None (Preliminary result)   Collection Time: 12/01/16  6:11 PM    Result Value Ref Range Status   Specimen Description BLOOD LEFT HAND  Final   Special Requests BOTTLES DRAWN AEROBIC ONLY 10CC  Final   Culture NO GROWTH < 24 HOURS  Final   Report Status PENDING  Incomplete  MRSA PCR Screening     Status: None   Collection Time: 12/02/16  6:07 AM  Result Value Ref Range Status   MRSA by PCR NEGATIVE NEGATIVE Final    Comment:        The GeneXpert MRSA Assay (FDA approved for NASAL specimens only), is one component of a comprehensive MRSA colonization surveillance program. It is not intended to diagnose MRSA infection nor to guide or monitor treatment for MRSA infections.          Radiology Studies: Dg Abdomen 1 View  Result Date: 12/01/2016 CLINICAL DATA:  Abdominal distention.  Constipation. EXAM: ABDOMEN - 1 VIEW COMPARISON:  04/18/2016. FINDINGS: Motion blurring. Grossly normal bowel gas pattern. Small bilateral renal calculi are again demonstrated. Lumbar lower thoracic spine degenerative changes. Rectal temperature probe. IMPRESSION: No acute abnormality. Previously demonstrated bilateral renal calculi. Electronically Signed   By: Claudie Revering M.D.   On: 12/01/2016 14:52   Dg Chest Port 1 View  Result Date: 12/02/2016 CLINICAL DATA:  Acute onset of shortness of breath. Sepsis. Initial encounter. EXAM: PORTABLE CHEST 1 VIEW COMPARISON:  Chest radiograph performed 12/01/2016 FINDINGS: The lungs are well-aerated. A small left pleural effusion is noted. There is no evidence of pneumothorax. The cardiomediastinal silhouette is mildly enlarged. The patient is status post median sternotomy. A pacemaker is noted overlying the left chest wall, with leads ending overlying the right atrium and right ventricle. No acute osseous abnormalities are seen. IMPRESSION: Small left pleural effusion noted. Lungs otherwise grossly clear. Mild cardiomegaly. Electronically Signed   By: Garald Balding M.D.   On: 12/02/2016 00:39  Dg Chest Port 1 View  Result  Date: 12/01/2016 CLINICAL DATA:  Shortness of breath. EXAM: PORTABLE CHEST 1 VIEW COMPARISON:  02/05/2014. FINDINGS: Interval mild enlargement of the cardiac silhouette, accentuated by a decreased inspiration and the portable AP technique. Increased prominence of the pulmonary vasculature and mild increased prominence of the interstitial markings. Stable post CABG changes and left subclavian bipolar pacemaker leads. Diffuse osteopenia. IMPRESSION: Interval mild cardiomegaly and mild changes of congestive heart failure. Electronically Signed   By: Claudie Revering M.D.   On: 12/01/2016 14:54   Ct Renal Stone Study  Result Date: 12/01/2016 CLINICAL DATA:  82 year old male with history of bilateral flank pain. Constipation. Difficulty urinating. History of urinary tract infection. EXAM: CT ABDOMEN AND PELVIS WITHOUT CONTRAST TECHNIQUE: Multidetector CT imaging of the abdomen and pelvis was performed following the standard protocol without IV contrast. COMPARISON:  CT the abdomen and pelvis 06/22/2013. FINDINGS: Lower chest: Pacemaker leads in the right atrium and right ventricle. Atherosclerotic calcifications in the right coronary artery. Postoperative changes of prior CABG. Mild pleural thickening or trace amount of pleural fluid in the lower right hemithorax. Hepatobiliary: 1.5 cm low-attenuation lesion in the left lobe of the liver is incompletely characterized on today's noncontrast CT examination, but is favored to represent a cyst. Tiny amount of high density material lying dependently in the gallbladder may reflect tiny gallstones or a small amount of biliary sludge in the fundus. No sign to suggest an acute cholecystitis at this time. Pancreas: No definite pancreatic mass or peripancreatic inflammatory changes are noted on today's noncontrast CT examination. Spleen: Unremarkable. Adrenals/Urinary Tract: Multiple small nonobstructive calculi are present within the collecting systems of the kidneys bilaterally  measuring up to 7 mm in the interpolar collecting system of the right kidney. No additional calculi are noted along the course of either ureter. No hydroureteronephrosis. Urinary bladder is nearly completely decompressed, but there is a 3 mm calculus in the urinary bladder (image 76 of series 2). Foley balloon catheter in place with tip in the lumen of the urinary bladder. 1.5 cm low low-attenuation lesion in the interpolar region of the right kidney is incompletely characterized on today's noncontrast CT examination, but is statistically likely a cyst. Bilateral adrenal glands are normal in appearance. Stomach/Bowel: The unenhanced appearance of the stomach is normal. There is no pathologic dilatation of small bowel or colon. Numerous colonic diverticulae are noted, particularly in the sigmoid colon, without surrounding inflammatory changes to suggest an acute diverticulitis at this time. The appendix is not confidently identified and may be surgically absent. Regardless, there are no inflammatory changes noted adjacent to the cecum to suggest the presence of an acute appendicitis at this time. Vascular/Lymphatic: Aortic atherosclerosis, without evidence of aneurysm in the abdominal or pelvic vasculature. No lymphadenopathy is noted in the abdomen or pelvis on today's noncontrast CT examination. Reproductive: Prostate gland is mildly enlarged measuring 5.0 x 5.6 x 6.1 cm. Seminal vesicles are unremarkable in appearance. Other: Small right inguinal hernia containing only fat. No significant volume of ascites. No pneumoperitoneum. Musculoskeletal: Chronic appearing compression fractures of T12 and L1, most severe at L1 where there is approximately 70% loss of central vertebral body height. There are no aggressive appearing lytic or blastic lesions noted in the visualized portions of the skeleton. IMPRESSION: 1. Multiple nonobstructive calculi are present within the collecting systems of the kidneys bilaterally  measuring up to 7 mm on the right side. In addition, there is a 3 mm calculus in the lumen of  the urinary bladder. No ureteral stones or findings of urinary tract obstruction are noted at this time. 2. Colonic diverticulosis without evidence of acute diverticulitis at this time. 3. Aortic atherosclerosis. 4. Prostatomegaly. 5. Additional incidental findings, as above. Electronically Signed   By: Vinnie Langton M.D.   On: 12/01/2016 16:01        Scheduled Meds: . apixaban  5 mg Oral Q12H  . cefTRIAXone (ROCEPHIN)  IV  2 g Intravenous Q24H  . feeding supplement (ENSURE ENLIVE)  237 mL Oral Q breakfast  . metoprolol  75 mg Oral BID  . midodrine  5 mg Oral TID WC  . mometasone-formoterol  2 puff Inhalation BID  . niacin  500 mg Oral QHS  . polyethylene glycol  17 g Oral Daily  . senna-docusate  1 tablet Oral BID  . sodium chloride  1,000 mL Intravenous Once  . sodium chloride flush  3 mL Intravenous Q12H  . sodium phosphate  1 enema Rectal Once  . tamsulosin  0.4 mg Oral QHS  . tiotropium  18 mcg Inhalation Daily   Continuous Infusions: . sodium chloride 50 mL/hr at 12/02/16 1839     LOS: 2 days    Time spent: 40 minutes    WOODS, Geraldo Docker, MD [facility] Pager [phone]   If 7PM-7AM, please contact night-coverage [email or web address] Password [email or web address] 12/03/2016, 8:26 AM

## 2016-12-03 NOTE — Progress Notes (Signed)
Pt had another episode of straining to have a bowel movement. HR  In 140's again, notified doctor

## 2016-12-04 ENCOUNTER — Telehealth: Payer: Self-pay | Admitting: Cardiovascular Disease

## 2016-12-04 LAB — CULTURE, BLOOD (ROUTINE X 2)

## 2016-12-04 LAB — GLUCOSE, CAPILLARY: GLUCOSE-CAPILLARY: 126 mg/dL — AB (ref 65–99)

## 2016-12-04 MED ORDER — POLYETHYLENE GLYCOL 3350 17 G PO PACK: 17.0000 g | PACK | Freq: Two times a day (BID) | ORAL | Status: DC

## 2016-12-04 MED ORDER — METOPROLOL TARTRATE 100 MG PO TABS
100.0000 mg | ORAL_TABLET | Freq: Two times a day (BID) | ORAL | Status: DC
  Administered 2016-12-04 – 2016-12-09 (×10): 100 mg via ORAL
  Filled 2016-12-04 (×10): qty 1

## 2016-12-04 MED ORDER — MAGNESIUM CITRATE PO SOLN
0.5000 | Freq: Once | ORAL | Status: AC
  Administered 2016-12-04: 0.5 via ORAL
  Filled 2016-12-04: qty 296

## 2016-12-04 MED ORDER — SENNOSIDES-DOCUSATE SODIUM 8.6-50 MG PO TABS: 1.0000 | ORAL_TABLET | Freq: Two times a day (BID) | ORAL | Status: DC

## 2016-12-04 NOTE — Progress Notes (Signed)
Physical Therapy Treatment Patient Details Name: [name] MRN: [id number] DOB: Feb 11, 1929 Today's Date: 12/04/2016    History of Present Illness 82 yo admitted with UTI and sepsis. PMhx: CAD, CABG, pacemaker, anemia, COPD, emphysema    PT Comments    Patient was willing to ambulate today, although he reported being uncomfortable due to needing to have BM . He moved to [facility] shortly after PT arrival, and did not wait for assistance to move to [facility] from PT or nursing even when prompted to wait. He demonstrated impulsivity due to lack of awareness of current deficits. Patient ambulated 75 feet on 2L O2 with RW use, and required min guard assist for directional assistance and line management. Chair followed. Patient tolerated ambulation well. Progress mobility. Will continue to follow.  HR throughout session: 117-122 SPO2 throughout session: 98-100%    Follow Up Recommendations  Home health PT;Supervision/Assistance - 24 hour     Equipment Recommendations  Rolling walker with 5" wheels;3in1 (PT)    Recommendations for Other Services OT consult     Precautions / Restrictions Precautions Precautions: Fall    Mobility  Bed Mobility Overal bed mobility: Needs Assistance Bed Mobility: Supine to Sit;Rolling Rolling: Supervision   Supine to sit: Supervision     General bed mobility comments: Increased time for supine to sit. Patient would not wait for assistance with bed mobility because he needed to have a BM.  Transfers Overall transfer level: Needs assistance   Transfers: Sit to/from Stand Sit to Stand: Min guard         General transfer comment: x2, for toileting and ambulation. Min guard for patient safety.   Ambulation/Gait Ambulation/Gait assistance: Min guard Ambulation Distance (Feet): 75 Feet Assistive device: Rolling walker (2 wheeled) Gait Pattern/deviations: Trunk flexed;Decreased stride length Gait velocity: decreased  Gait velocity interpretation:  Below normal speed for age/gender General Gait Details: patient did not want gait belt assistance, but it was necessary for minor directional changes and patient safety. Patient cuing to extend trunk during ambulation.    Stairs            Wheelchair Mobility    Modified Rankin (Stroke Patients Only)       Balance Overall balance assessment: Needs assistance Sitting-balance support: Feet supported Sitting balance-Leahy Scale: Fair     Standing balance support: Bilateral upper extremity supported;During functional activity Standing balance-Leahy Scale: Fair Standing balance comment: UEs supported with RW                     Cognition Arousal/Alertness: Awake/alert Behavior During Therapy: Impulsive (3 counts of impulsivity/not waiting for PT with transfers ) Overall Cognitive Status: Within Functional Limits for tasks assessed                      Exercises General Exercises - Lower Extremity Hip ABduction/ADduction: AROM;Both;5 reps;Seated    General Comments        Pertinent Vitals/Pain Pain Assessment: No/denies pain    Home Living                      Prior Function            PT Goals (current goals can now be found in the care plan section) Acute Rehab PT Goals Patient Stated Goal: progress mobility  PT Goal Formulation: With patient Time For Goal Achievement: 12/18/16 Potential to Achieve Goals: Good Progress towards PT goals: Progressing toward goals    Frequency  Min 3X/week      PT Plan Current plan remains appropriate    Co-evaluation             End of Session Equipment Utilized During Treatment: Gait belt;Oxygen Activity Tolerance: Patient tolerated treatment well;No increased pain;Patient limited by fatigue Patient left: in chair;with call bell/phone within reach;with chair alarm set     Time: [id number] PT Time Calculation (min) (ACUTE ONLY): 27 min  Charges:  $Gait Training: 8-22  mins $Therapeutic Activity: 8-22 mins                    G Codes:      Elza Sortor 12-16-2016, 3:03 PM [place] SPT [id number]

## 2016-12-04 NOTE — Care Management Note (Addendum)
Case Management Note  Patient Details  Name: MALLIK RIZK MRN: [id number] Date of Birth: [birth year]  Subjective/Objective:  Presents with sepsis, uti, leukopenia, MGUS , aki, he lives with daughter [name], who takes care of him.  NCM left her a vm to call back regarding HHPT for patient at home.  Patient states he would like for her to chose the agency for him.  He lives in [place].  NCM awaiting call back from daughter.  NCM also spoke with [name] the [place], and she states to call [name] because she is with him everyday.   1430 12/6 Tomi Bamberger RN, BSN - NCM received call from daughter [name], she states they have had [place] [facility] before and would like to work with them again.  Also she states she thinks patient has a rolling walker that his wife had and she will check in the garage and call me back to let me know.   NCM made referral with Stanton Kidney with [name] for HHPT.  Stanton Kidney states they will be able to take referral.  Soc will begin 24-48 hrs post dc.                   Action/Plan:   Expected Discharge Date:                  Expected Discharge Plan:  [place]  In-House Referral:     Discharge planning Services  CM Consult  Post Acute Care Choice:    Choice offered to:     DME Arranged:    DME Agency:     HH Arranged:   HHPT HH Agency:   [place]  Status of Service:  In process, will continue to follow  If discussed at Long Length of Stay Meetings, dates discussed:    Additional Comments:  Zenon Mayo, RN 12/04/2016, 1:06 PM

## 2016-12-04 NOTE — Care Management Important Message (Signed)
Important Message  Patient Details  Name: [name] MRN: [id number] Date of Birth: [birth year]   Medicare Important Message Given:  Yes    Judd Mccubbin Abena 12/04/2016, 9:09 AM

## 2016-12-04 NOTE — Progress Notes (Signed)
Pt transferred to 2w from 3s. Telemetry box applied and CCMD notified. Pt receiving 2l of oxygen via nasal cannula. Vitals are stable. Pt oriented to room. Will continue current plan of care.   Grant Fontana BSN, RN

## 2016-12-04 NOTE — Telephone Encounter (Signed)
pts dtr [name] calling to say pt in the hospital and not sure if he will be out by his 12-10-16 Echo, can he get order to have Echo done at [facility]?  pls call Joann [phone]

## 2016-12-04 NOTE — Progress Notes (Signed)
[name] TEAM 1 - Stepdown/ICU TEAM  [name]  [id number] DOB: May 17, 1929 DOA: 12/01/2016 PCP: Leonides Sake, MD    Brief Narrative:  82 year old with COPD, anemia, MGUS, and CAD who presented w/ a fever of 105, dysuria, and difficulty urinating for 3 days. In the ED Pulse was 130, Temp 104.2 F, and UA was positive.    Subjective: The patient is alert and conversant.  He denies chest pain shortness breath fevers or chills.  He states he feels very weak in general.  He admits to having a poor appetite.  He also complains of significant constipation.  Assessment & Plan:  Sepsis due to Klebsiella UTI w/ bacteremia  Sepsis physiology has resolved - continue directed antibiotic therapy  Leukopenia in the setting of sepsis WBC 0.8 with ANC 0.7 - Heme following - due to gram neg bacteremia - now corrected   MGUS followed by Dr. Marin Olp - noncontributory   COPD  Well compensated  Acute kidney injury on Chronic kidney disease baseline creatinine 1.2-1.4 - crt has improved with volume resuscitation - recheck in a.m. to assure remains near baseline  Recent Labs Lab 12/01/16 1414 12/01/16 1749 12/02/16 0212 12/03/16 0325  CREATININE 1.64* 1.71* 1.87* 1.36*    CAD s/p CABG 2006 Asymptomatic at this time   Parox SVT - recurrent A flutter  S/p [place] on chronic eliquis - remains in sinus tachy at present - increase beta blocker and follow  Anemia of chronic disease and iron deficiency Care per Heme - no indication for transfusion at this time   Transaminitis Mild shock liver - improving w/ volume resuscitation    Hyperlipidemia Continue home statins  Orthostatic Hypotension  On chronic midodrine   DVT prophylaxis: eliquis  Code Status: FULL CODE Family Communication: no family present at time of exam  Disposition Plan: Transfer to telemetry bed - ambulate  Consultants:  Heme/Onc  Procedures: none  Antimicrobials:  Zosyn 12/3  Vanc  12/3 Rocephin 12/4 >  Objective: Blood pressure 118/75, pulse (!) 120, temperature 97.2 F (36.2 C), temperature source Oral, resp. rate 18, height 5\' 10"  (1.778 m), weight 84.3 kg (185 lb 13.6 oz), SpO2 98 %.  Intake/Output Summary (Last 24 hours) at 12/04/16 1201 Last data filed at 12/04/16 0659  Gross per 24 hour  Intake             1650 ml  Output              600 ml  Net             1050 ml   Filed Weights   12/01/16 1418 12/02/16 0613  Weight: 87.1 kg (192 lb) 84.3 kg (185 lb 13.6 oz)    Examination: General: No acute respiratory distress - alert Lungs: Clear to auscultation bilaterally  Cardiovascular: Regular but tachycardiac at 110bpm - no M or gallop Abdomen: Nontender, nondistended, soft, bowel sounds positive, no rebound Extremities: No significant edema bilateral lower extremities  CBC:  Recent Labs Lab 12/01/16 1414 12/01/16 1749 12/02/16 0212 12/03/16 0325  WBC 0.8* 5.1 17.7* 19.9*  NEUTROABS 0.7* 5.0  --   --   HGB 9.8* 9.6* 9.0* 9.1*  HCT 30.9* 29.8* 27.8* 28.5*  MCV 97.8 99.3 99.3 99.3  PLT 115* 97* 97* 98*   Basic Metabolic Panel:  Recent Labs Lab 12/01/16 1414 12/01/16 1749 12/02/16 0212 12/03/16 0325  NA 139 138 137 138  K 4.2 3.4* 4.6 4.3  CL 106 107 106 105  CO2  23 20* 20* 23  GLUCOSE 120* 135* 121* 127*  BUN 35* 32* 35* 36*  CREATININE 1.64* 1.71* 1.87* 1.36*  CALCIUM 9.0 8.2* 8.1* 8.8*   GFR: Estimated Creatinine Clearance: 41 mL/min (by C-G formula based on SCr of 1.36 mg/dL (H)).  Liver Function Tests:  Recent Labs Lab 12/01/16 1414 12/01/16 1749 12/02/16 0212 12/03/16 0325  AST 370* 368* 336* 171*  ALT 305* 310* 326* 243*  ALKPHOS 201* 207* 190* 215*  BILITOT 1.8* 1.9* 2.2* 1.3*  PROT 6.9 5.9* 6.1* 6.3*  ALBUMIN 3.1* 2.6* 2.9* 2.7*    Coagulation Profile:  Recent Labs Lab 12/01/16 1749 12/02/16 0212  INR 1.86 1.90    HbA1C: Hgb A1c MFr Bld  Date/Time Value Ref Range Status  07/17/2009 01:00 PM  4.6 -  6.1 % Final   5.9 (NOTE) The ADA recommends the following therapeutic goal for glycemic control related to Hgb A1c measurement: Goal of therapy: <6.5 Hgb A1c  Reference: American Diabetes Association: Clinical Practice Recommendations 2010, Diabetes Care, 2010, 33: (Suppl  1).    CBG:  Recent Labs Lab 12/02/16 0608  GLUCAP 104*    Recent Results (from the past 240 hour(s))  Blood Culture (routine x 2)     Status: Abnormal   Collection Time: 12/01/16  2:14 PM  Result Value Ref Range Status   Specimen Description BLOOD RIGHT ANTECUBITAL  Final   Special Requests BOTTLES DRAWN AEROBIC AND ANAEROBIC 5CC  Final   Culture  Setup Time   Final    IN BOTH AEROBIC AND ANAEROBIC BOTTLES GRAM NEGATIVE RODS CRITICAL RESULT CALLED TO, READ BACK BY AND VERIFIED WITH: RTollie Eth AT 0710 ON [id number] BY S. YARBROUGH    Culture KLEBSIELLA OXYTOCA (A)  Final   Report Status 12/04/2016 FINAL  Final   Organism ID, Bacteria KLEBSIELLA OXYTOCA  Final      Susceptibility   Klebsiella oxytoca - MIC*    AMPICILLIN >=32 RESISTANT Resistant     CEFAZOLIN >=64 RESISTANT Resistant     CEFEPIME <=1 SENSITIVE Sensitive     CEFTAZIDIME <=1 SENSITIVE Sensitive     CEFTRIAXONE <=1 SENSITIVE Sensitive     CIPROFLOXACIN <=0.25 SENSITIVE Sensitive     GENTAMICIN <=1 SENSITIVE Sensitive     IMIPENEM <=0.25 SENSITIVE Sensitive     TRIMETH/SULFA <=20 SENSITIVE Sensitive     AMPICILLIN/SULBACTAM 16 INTERMEDIATE Intermediate     PIP/TAZO <=4 SENSITIVE Sensitive     Extended ESBL NEGATIVE Sensitive     * KLEBSIELLA OXYTOCA  Blood Culture (routine x 2)     Status: Abnormal   Collection Time: 12/01/16  2:14 PM  Result Value Ref Range Status   Specimen Description BLOOD RIGHT ANTECUBITAL  Final   Special Requests BOTTLES DRAWN AEROBIC AND ANAEROBIC 5CC  Final   Culture  Setup Time   Final    IN BOTH AEROBIC AND ANAEROBIC BOTTLES GRAM NEGATIVE RODS CRITICAL RESULT CALLED TO, READ BACK BY AND VERIFIED WITH: R.  CLARK, PHARM AT 0710 ON [id number] BY S. YARBROOUGH    Culture (A)  Final    KLEBSIELLA OXYTOCA SUSCEPTIBILITIES PERFORMED ON PREVIOUS CULTURE WITHIN THE LAST 5 DAYS.    Report Status 12/04/2016 FINAL  Final  Blood Culture ID Panel (Reflexed)     Status: Abnormal   Collection Time: 12/01/16  2:14 PM  Result Value Ref Range Status   Enterococcus species NOT DETECTED NOT DETECTED Final   Listeria monocytogenes NOT DETECTED NOT DETECTED Final   Staphylococcus species NOT  DETECTED NOT DETECTED Final   Staphylococcus aureus NOT DETECTED NOT DETECTED Final   Streptococcus species NOT DETECTED NOT DETECTED Final   Streptococcus agalactiae NOT DETECTED NOT DETECTED Final   Streptococcus pneumoniae NOT DETECTED NOT DETECTED Final   Streptococcus pyogenes NOT DETECTED NOT DETECTED Final   Acinetobacter baumannii NOT DETECTED NOT DETECTED Final   Enterobacteriaceae species DETECTED (A) NOT DETECTED Final    Comment: CRITICAL RESULT CALLED TO, READ BACK BY AND VERIFIED WITH: R. CLARK, PHARM AT 0710 ON [id number] BY S. YARBROUGH    Enterobacter cloacae complex NOT DETECTED NOT DETECTED Final   Escherichia coli NOT DETECTED NOT DETECTED Final   Klebsiella oxytoca DETECTED (A) NOT DETECTED Final    Comment: CRITICAL RESULT CALLED TO, READ BACK BY AND VERIFIED WITH: R. CLARK, PHARM AT 0710 ON [id number] BY S. YARBROUGH    Klebsiella pneumoniae NOT DETECTED NOT DETECTED Final   Proteus species NOT DETECTED NOT DETECTED Final   Serratia marcescens NOT DETECTED NOT DETECTED Final   Carbapenem resistance NOT DETECTED NOT DETECTED Final   Haemophilus influenzae NOT DETECTED NOT DETECTED Final   Neisseria meningitidis NOT DETECTED NOT DETECTED Final   Pseudomonas aeruginosa NOT DETECTED NOT DETECTED Final   Candida albicans NOT DETECTED NOT DETECTED Final   Candida glabrata NOT DETECTED NOT DETECTED Final   Candida krusei NOT DETECTED NOT DETECTED Final   Candida parapsilosis NOT DETECTED NOT DETECTED Final    Candida tropicalis NOT DETECTED NOT DETECTED Final  Urine culture     Status: Abnormal   Collection Time: 12/01/16  2:28 PM  Result Value Ref Range Status   Specimen Description URINE, RANDOM  Final   Special Requests NONE  Final   Culture >=100,000 COLONIES/mL KLEBSIELLA OXYTOCA (A)  Final   Report Status 12/03/2016 FINAL  Final   Organism ID, Bacteria KLEBSIELLA OXYTOCA (A)  Final      Susceptibility   Klebsiella oxytoca - MIC*    AMPICILLIN >=32 RESISTANT Resistant     CEFAZOLIN >=64 RESISTANT Resistant     CEFTRIAXONE <=1 SENSITIVE Sensitive     CIPROFLOXACIN <=0.25 SENSITIVE Sensitive     GENTAMICIN <=1 SENSITIVE Sensitive     IMIPENEM <=0.25 SENSITIVE Sensitive     NITROFURANTOIN 32 SENSITIVE Sensitive     TRIMETH/SULFA <=20 SENSITIVE Sensitive     AMPICILLIN/SULBACTAM 16 INTERMEDIATE Intermediate     PIP/TAZO <=4 SENSITIVE Sensitive     Extended ESBL NEGATIVE Sensitive     * >=100,000 COLONIES/mL KLEBSIELLA OXYTOCA  Culture, blood (x 2)     Status: None (Preliminary result)   Collection Time: 12/01/16  6:00 PM  Result Value Ref Range Status   Specimen Description BLOOD RIGHT ANTECUBITAL  Final   Special Requests   Final    BOTTLES DRAWN AEROBIC AND ANAEROBIC 10CC AER 5CC ANA   Culture NO GROWTH 2 DAYS  Final   Report Status PENDING  Incomplete  Culture, blood (x 2)     Status: None (Preliminary result)   Collection Time: 12/01/16  6:11 PM  Result Value Ref Range Status   Specimen Description BLOOD LEFT HAND  Final   Special Requests BOTTLES DRAWN AEROBIC ONLY 10CC  Final   Culture NO GROWTH 2 DAYS  Final   Report Status PENDING  Incomplete  MRSA PCR Screening     Status: None   Collection Time: 12/02/16  6:07 AM  Result Value Ref Range Status   MRSA by PCR NEGATIVE NEGATIVE Final    Comment:  The GeneXpert MRSA Assay (FDA approved for NASAL specimens only), is one component of a comprehensive MRSA colonization surveillance program. It is not intended to  diagnose MRSA infection nor to guide or monitor treatment for MRSA infections.      Scheduled Meds: . apixaban  5 mg Oral Q12H  . cefTRIAXone (ROCEPHIN)  IV  2 g Intravenous Q24H  . feeding supplement (ENSURE ENLIVE)  237 mL Oral Q breakfast  . metoprolol  75 mg Oral BID  . midodrine  5 mg Oral TID WC  . mometasone-formoterol  2 puff Inhalation BID  . niacin  500 mg Oral QHS  . polyethylene glycol  17 g Oral Daily  . senna-docusate  1 tablet Oral BID  . sodium chloride  1,000 mL Intravenous Once  . sodium chloride flush  3 mL Intravenous Q12H  . sodium phosphate  1 enema Rectal Once  . tamsulosin  0.4 mg Oral QHS  . tiotropium  18 mcg Inhalation Daily   Continuous Infusions: . sodium chloride 50 mL/hr at 12/04/16 0659     LOS: 3 days   Cherene Altes, MD [facility] Office  [phone] Pager - Text Page per Shea Evans as per below:  On-Call/Text Page:      [email or web address]      password [email or web address]  If 7PM-7AM, please contact night-coverage [email or web address] Password [email or web address] 12/04/2016, 12:01 PM

## 2016-12-04 NOTE — Progress Notes (Signed)
Patient transported to 2w22 via bed.

## 2016-12-04 NOTE — Telephone Encounter (Signed)
Spoke to [employer], daughter and listed DPR. Pt has a scheduled echo on 12/12.  She is unsure who ordered an echo for patient, Dr. Claiborne Billings or Dr. Sallyanne Kuster, but patient is currently admitted to hospital and she's wondering if this can be ordered/set up to be done while he is in hospital. Aware I will route message for review by provider. She voiced thanks.

## 2016-12-04 NOTE — Telephone Encounter (Signed)
I went ahead and ordered it

## 2016-12-05 ENCOUNTER — Inpatient Hospital Stay (HOSPITAL_COMMUNITY): Payer: Medicare Other

## 2016-12-05 ENCOUNTER — Telehealth: Payer: Self-pay | Admitting: Emergency Medicine

## 2016-12-05 DIAGNOSIS — I2510 Atherosclerotic heart disease of native coronary artery without angina pectoris: Secondary | ICD-10-CM

## 2016-12-05 DIAGNOSIS — B9689 Other specified bacterial agents as the cause of diseases classified elsewhere: Secondary | ICD-10-CM

## 2016-12-05 DIAGNOSIS — R7881 Bacteremia: Secondary | ICD-10-CM

## 2016-12-05 LAB — COMPREHENSIVE METABOLIC PANEL
ALBUMIN: 2.6 g/dL — AB (ref 3.5–5.0)
ALK PHOS: 198 U/L — AB (ref 38–126)
ALT: 171 U/L — ABNORMAL HIGH (ref 17–63)
AST: 103 U/L — AB (ref 15–41)
Anion gap: 6 (ref 5–15)
BILIRUBIN TOTAL: 0.7 mg/dL (ref 0.3–1.2)
BUN: 21 mg/dL — AB (ref 6–20)
CALCIUM: 9 mg/dL (ref 8.9–10.3)
CO2: 27 mmol/L (ref 22–32)
Chloride: 107 mmol/L (ref 101–111)
Creatinine, Ser: 1.12 mg/dL (ref 0.61–1.24)
GFR calc Af Amer: >60 mL/min (ref >60)
GFR calc non Af Amer: 58 mL/min — ABNORMAL LOW (ref >60)
GLUCOSE: 111 mg/dL — AB (ref 65–99)
Potassium: 4.5 mmol/L (ref 3.5–5.1)
Sodium: 140 mmol/L (ref 135–145)
TOTAL PROTEIN: 6 g/dL — AB (ref 6.5–8.1)

## 2016-12-05 LAB — CBC
HCT: 28.8 % — ABNORMAL LOW (ref 39.0–52.0)
Hemoglobin: 9.2 g/dL — ABNORMAL LOW (ref 13.0–17.0)
MCH: 32.1 pg (ref 26.0–34.0)
MCHC: 31.9 g/dL (ref 30.0–36.0)
MCV: 100.3 fL — ABNORMAL HIGH (ref 78.0–100.0)
Platelets: 114 10*3/uL — ABNORMAL LOW (ref 150–400)
RBC: 2.87 MIL/uL — ABNORMAL LOW (ref 4.22–5.81)
RDW: 14 % (ref 11.5–15.5)
WBC: 9.5 10*3/uL (ref 4.0–10.5)

## 2016-12-05 MED ORDER — SACCHAROMYCES BOULARDII 250 MG PO CAPS
250.0000 mg | ORAL_CAPSULE | Freq: Two times a day (BID) | ORAL | Status: DC
  Administered 2016-12-05 – 2016-12-09 (×9): 250 mg via ORAL
  Filled 2016-12-05 (×9): qty 1

## 2016-12-05 NOTE — Evaluation (Signed)
Occupational Therapy Evaluation Patient Details Name: [name] MRN: [id number] DOB: 05-22-1929 Today's Date: 12/05/2016    History of Present Illness 82 yo admitted with UTI and sepsis. PMhx: CAD, CABG, pacemaker, anemia, COPD, emphysema   Clinical Impression   Pt reports he was independent with ADL PTA. Currently pt overall min guard for short distance functional mobility and ADL. Pt impulsive with transfers and has decreased awareness of safety and his deficits. HR 122, SpO2=94% on RA with activity, DOE 2/4, reapplied 2L supplemental O2 at end of session. Pt planning to d/c home with supervision from his daughter. Recommending HHOT for follow up to maximize independence and safety with ADL and functional mobility upon return home. Pt would benefit from continued skilled OT to address established goals.    Follow Up Recommendations  Home health OT;Supervision/Assistance - 24 hour    Equipment Recommendations  3 in 1 bedside commode    Recommendations for Other Services       Precautions / Restrictions Precautions Precautions: Fall Precaution Comments: watch HR Restrictions Weight Bearing Restrictions: No      Mobility Bed Mobility Overal bed mobility: Needs Assistance Bed Mobility: Supine to Sit;Sit to Supine     Supine to sit: Supervision Sit to supine: Supervision   General bed mobility comments: Supervision for safety; no physical assist required. Increased time needed.  Transfers Overall transfer level: Needs assistance Equipment used: None Transfers: Sit to/from [facility] Sit to Stand: Min guard Stand pivot transfers: Min guard       General transfer comment: Min guard for safety; no physical assist required. Pt impulsive with transfer to [facility] due to urgency of BM.    Balance Overall balance assessment: Needs assistance Sitting-balance support: Feet supported;No upper extremity supported Sitting balance-Leahy Scale: Fair      Standing balance support: No upper extremity supported;During functional activity Standing balance-Leahy Scale: Fair Standing balance comment: Able to stand at sink and complete grooming without UE support and min guard                            ADL Overall ADL's : Needs assistance/impaired Eating/Feeding: Set up;Sitting   Grooming: Min guard;Standing;Wash/dry face;Oral care   Upper Body Bathing: Set up;Supervision/ safety;Sitting   Lower Body Bathing: Min guard;Sit to/from stand   Upper Body Dressing : Set up;Supervision/safety;Sitting   Lower Body Dressing: Min guard;Sit to/from stand   Toilet Transfer: Min Statistician Details (indicate cue type and reason): stand pivot performed due to urgency of bowel Toileting- Clothing Manipulation and Hygiene: Total assistance;Sit to/from stand Toileting - Clothing Manipulation Details (indicate cue type and reason): for peri care; pt refusing to assist     Functional mobility during ADLs: Min guard General ADL Comments: HR 122 throughout; DOE 2/4; SpO2 94% on RA following activity, 2L supplemental O2 reapplied.     Vision     Perception     Praxis      Pertinent Vitals/Pain Pain Assessment: No/denies pain     Hand Dominance Right   Extremity/Trunk Assessment Upper Extremity Assessment Upper Extremity Assessment: Overall WFL for tasks assessed   Lower Extremity Assessment Lower Extremity Assessment: Defer to PT evaluation   Cervical / Trunk Assessment Cervical / Trunk Assessment: Kyphotic   Communication Communication Communication: HOH (can only hear out of L ear)   Cognition Arousal/Alertness: Awake/alert Behavior During Therapy: Impulsive Overall Cognitive Status: Within Functional Limits for tasks assessed  General Comments       Exercises       Shoulder Instructions      Home Living Family/patient expects to be discharged to:: Private  residence Living Arrangements: Children Available Help at Discharge: Family;Available PRN/intermittently Type of Home: House Home Access: Stairs to enter [employer] of Steps: 3 Entrance Stairs-Rails: Right;Left Home Layout: One level     Bathroom Shower/Tub: Tub/shower unit Shower/tub characteristics: Architectural technologist: Standard     Home Equipment: Marine scientist - single point          Prior Functioning/Environment Level of Independence: Independent        Comments: Daughter does all the homemaking        OT Problem List: Decreased strength;Decreased activity tolerance;Impaired balance (sitting and/or standing);Decreased safety awareness;Decreased knowledge of use of DME or AE;Decreased knowledge of precautions   OT Treatment/Interventions: Self-care/ADL training;Energy conservation;DME and/or AE instruction;Therapeutic activities;Patient/family education;Balance training    OT Goals(Current goals can be found in the care plan section) Acute Rehab OT Goals Patient Stated Goal: return home OT Goal Formulation: With patient Time For Goal Achievement: 12/19/16 Potential to Achieve Goals: Good ADL Goals Pt Will Perform Grooming: with supervision;standing Pt Will Perform Upper Body Bathing: sitting;with set-up Pt Will Perform Lower Body Bathing: with supervision;sit to/from stand Pt Will Transfer to Toilet: with supervision;ambulating;bedside commode (over toilet) Pt Will Perform Toileting - Clothing Manipulation and hygiene: with supervision;sit to/from stand Pt Will Perform Tub/Shower Transfer: Tub transfer;with supervision;ambulating;shower seat;rolling walker Additional ADL Goal #1: Pt will independently verbally recall 3 energy conservation strategies.  OT Frequency: Min 2X/week   Barriers to D/C:            Co-evaluation              End of Session Equipment Utilized During Treatment: Oxygen Nurse Communication: Mobility  status;Other (comment) (no chair alarm pads present so pt returned to bed)  Activity Tolerance: Patient tolerated treatment well Patient left: in bed;with call bell/phone within reach;with bed alarm set   Time: 1020-1042 OT Time Calculation (min): 22 min Charges:  OT General Charges $OT Visit: 1 Procedure OT Evaluation $OT Eval Moderate Complexity: 1 Procedure G-Codes:     Binnie Kand M.S., OTR/L Pager: [phone]  12/05/2016, 10:50 AM

## 2016-12-05 NOTE — Progress Notes (Addendum)
[name]  [id number] DOB: 1929-11-29 DOA: 12/01/2016 PCP: Leonides Sake, MD    Brief Narrative:  82 year old with COPD, anemia, MGUS, and CAD who presented w/ a fever of 105, dysuria, and difficulty urinating for 3 days. In the ED Pulse was 130, Temp 104.2 F, and UA was positive.    Subjective: He is not feeling better, he has been having multiples BM.  He received laxatives yesterday.  Denies abdominal pain   Assessment & Plan:  Sepsis due to Klebsiella UTI w/ bacteremia  Sepsis physiology has resolved - continue directed antibiotic therapy On  ceftriaxone. Will repeat blood cultures.   Leukopenia in the setting of sepsis WBC 0.8 with ANC 0.7 - Heme following - due to gram neg bacteremia - now corrected Resolved.   Transaminitis ? Mild shock liver - improving w/ volume resuscitation  check RUQ [place], due to bacteremia.   CAD; supposed to get ECHO this month. This was ordered.   MGUS followed by Dr. Marin Olp - noncontributory   COPD  Well compensated  Acute kidney injury on Chronic kidney disease baseline creatinine 1.2-1.4 - crt has improved with volume resuscitation - Recent Labs Lab 12/01/16 1414 12/01/16 1749 12/02/16 0212 12/03/16 0325 12/05/16 0259  CREATININE 1.64* 1.71* 1.87* 1.36* 1.12  improved.    CAD s/p CABG 2006 Asymptomatic at this time   Parox SVT - recurrent A flutter  S/p [place] on chronic eliquis - remains in sinus tachy at present - increase beta blocker and follow  Anemia of chronic disease and iron deficiency Care per Heme - no indication for transfusion at this time     Hyperlipidemia Continue home statins  Orthostatic Hypotension  On chronic midodrine   DVT prophylaxis: eliquis  Code Status: FULL CODE Family Communication: no family present at time of exam  Disposition Plan: Transfer to telemetry bed - ambulate  Consultants:  Heme/Onc  Procedures: none  Antimicrobials:  Zosyn 12/3  Vanc 12/3 Rocephin  12/4 >  Objective: Blood pressure 120/75, pulse (!) 116, temperature 97.5 F (36.4 C), temperature source Axillary, resp. rate 20, height 5\' 10"  (1.778 m), weight 84.3 kg (185 lb 13.6 oz), SpO2 98 %.  Intake/Output Summary (Last 24 hours) at 12/05/16 1004 Last data filed at 12/05/16 0900  Gross per 24 hour  Intake           397.16 ml  Output              850 ml  Net          -452.84 ml   Filed Weights   12/01/16 1418 12/02/16 0613  Weight: 87.1 kg (192 lb) 84.3 kg (185 lb 13.6 oz)    Examination: General: No acute respiratory distress - alert Lungs: Clear to auscultation bilaterally  Cardiovascular: Regular but tachycardiac at 110bpm - no M or gallop Abdomen: Nontender, nondistended, soft, bowel sounds positive, no rebound Extremities: No significant edema bilateral lower extremities  CBC:  Recent Labs Lab 12/01/16 1414 12/01/16 1749 12/02/16 0212 12/03/16 0325 12/05/16 0259  WBC 0.8* 5.1 17.7* 19.9* 9.5  NEUTROABS 0.7* 5.0  --   --   --   HGB 9.8* 9.6* 9.0* 9.1* 9.2*  HCT 30.9* 29.8* 27.8* 28.5* 28.8*  MCV 97.8 99.3 99.3 99.3 100.3*  PLT 115* 97* 97* 98* [id number]*   Basic Metabolic Panel:  Recent Labs Lab 12/01/16 1414 12/01/16 1749 12/02/16 0212 12/03/16 0325 12/05/16 0259  NA 139 138 137 138 140  K 4.2 3.4* 4.6 4.3  4.5  CL 106 107 106 105 107  CO2 23 20* 20* 23 27  GLUCOSE 120* 135* 121* 127* 111*  BUN 35* 32* 35* 36* 21*  CREATININE 1.64* 1.71* 1.87* 1.36* 1.12  CALCIUM 9.0 8.2* 8.1* 8.8* 9.0   GFR: Estimated Creatinine Clearance: 49.8 mL/min (by C-G formula based on SCr of 1.12 mg/dL).  Liver Function Tests:  Recent Labs Lab 12/01/16 1414 12/01/16 1749 12/02/16 0212 12/03/16 0325 12/05/16 0259  AST 370* 368* 336* 171* 103*  ALT 305* 310* 326* 243* 171*  ALKPHOS 201* 207* 190* 215* 198*  BILITOT 1.8* 1.9* 2.2* 1.3* 0.7  PROT 6.9 5.9* 6.1* 6.3* 6.0*  ALBUMIN 3.1* 2.6* 2.9* 2.7* 2.6*    Coagulation Profile:  Recent Labs Lab 12/01/16 1749  12/02/16 0212  INR 1.86 1.90    HbA1C: Hgb A1c MFr Bld  Date/Time Value Ref Range Status  07/17/2009 01:00 PM  4.6 - 6.1 % Final   5.9 (NOTE) The ADA recommends the following therapeutic goal for glycemic control related to Hgb A1c measurement: Goal of therapy: <6.5 Hgb A1c  Reference: American Diabetes Association: Clinical Practice Recommendations 2010, Diabetes Care, 2010, 33: (Suppl  1).    CBG:  Recent Labs Lab 12/02/16 0608 12/04/16 2053  GLUCAP 104* 126*    Recent Results (from the past 240 hour(s))  Blood Culture (routine x 2)     Status: Abnormal   Collection Time: 12/01/16  2:14 PM  Result Value Ref Range Status   Specimen Description BLOOD RIGHT ANTECUBITAL  Final   Special Requests BOTTLES DRAWN AEROBIC AND ANAEROBIC 5CC  Final   Culture  Setup Time   Final    IN BOTH AEROBIC AND ANAEROBIC BOTTLES GRAM NEGATIVE RODS CRITICAL RESULT CALLED TO, READ BACK BY AND VERIFIED WITH: RTollie Eth AT 0710 ON [id number] BY S. YARBROUGH    Culture KLEBSIELLA OXYTOCA (A)  Final   Report Status 12/04/2016 FINAL  Final   Organism ID, Bacteria KLEBSIELLA OXYTOCA  Final      Susceptibility   Klebsiella oxytoca - MIC*    AMPICILLIN >=32 RESISTANT Resistant     CEFAZOLIN >=64 RESISTANT Resistant     CEFEPIME <=1 SENSITIVE Sensitive     CEFTAZIDIME <=1 SENSITIVE Sensitive     CEFTRIAXONE <=1 SENSITIVE Sensitive     CIPROFLOXACIN <=0.25 SENSITIVE Sensitive     GENTAMICIN <=1 SENSITIVE Sensitive     IMIPENEM <=0.25 SENSITIVE Sensitive     TRIMETH/SULFA <=20 SENSITIVE Sensitive     AMPICILLIN/SULBACTAM 16 INTERMEDIATE Intermediate     PIP/TAZO <=4 SENSITIVE Sensitive     Extended ESBL NEGATIVE Sensitive     * KLEBSIELLA OXYTOCA  Blood Culture (routine x 2)     Status: Abnormal   Collection Time: 12/01/16  2:14 PM  Result Value Ref Range Status   Specimen Description BLOOD RIGHT ANTECUBITAL  Final   Special Requests BOTTLES DRAWN AEROBIC AND ANAEROBIC 5CC  Final   Culture   Setup Time   Final    IN BOTH AEROBIC AND ANAEROBIC BOTTLES GRAM NEGATIVE RODS CRITICAL RESULT CALLED TO, READ BACK BY AND VERIFIED WITH: R. CLARK, PHARM AT 0710 ON [id number] BY S. YARBROOUGH    Culture (A)  Final    KLEBSIELLA OXYTOCA SUSCEPTIBILITIES PERFORMED ON PREVIOUS CULTURE WITHIN THE LAST 5 DAYS.    Report Status 12/04/2016 FINAL  Final  Blood Culture ID Panel (Reflexed)     Status: Abnormal   Collection Time: 12/01/16  2:14 PM  Result Value Ref  Range Status   Enterococcus species NOT DETECTED NOT DETECTED Final   Listeria monocytogenes NOT DETECTED NOT DETECTED Final   Staphylococcus species NOT DETECTED NOT DETECTED Final   Staphylococcus aureus NOT DETECTED NOT DETECTED Final   Streptococcus species NOT DETECTED NOT DETECTED Final   Streptococcus agalactiae NOT DETECTED NOT DETECTED Final   Streptococcus pneumoniae NOT DETECTED NOT DETECTED Final   Streptococcus pyogenes NOT DETECTED NOT DETECTED Final   Acinetobacter baumannii NOT DETECTED NOT DETECTED Final   Enterobacteriaceae species DETECTED (A) NOT DETECTED Final    Comment: CRITICAL RESULT CALLED TO, READ BACK BY AND VERIFIED WITH: R. CLARK, PHARM AT 0710 ON [id number] BY S. YARBROUGH    Enterobacter cloacae complex NOT DETECTED NOT DETECTED Final   Escherichia coli NOT DETECTED NOT DETECTED Final   Klebsiella oxytoca DETECTED (A) NOT DETECTED Final    Comment: CRITICAL RESULT CALLED TO, READ BACK BY AND VERIFIED WITH: R. CLARK, PHARM AT 0710 ON [id number] BY S. YARBROUGH    Klebsiella pneumoniae NOT DETECTED NOT DETECTED Final   Proteus species NOT DETECTED NOT DETECTED Final   Serratia marcescens NOT DETECTED NOT DETECTED Final   Carbapenem resistance NOT DETECTED NOT DETECTED Final   Haemophilus influenzae NOT DETECTED NOT DETECTED Final   Neisseria meningitidis NOT DETECTED NOT DETECTED Final   Pseudomonas aeruginosa NOT DETECTED NOT DETECTED Final   Candida albicans NOT DETECTED NOT DETECTED Final   Candida  glabrata NOT DETECTED NOT DETECTED Final   Candida krusei NOT DETECTED NOT DETECTED Final   Candida parapsilosis NOT DETECTED NOT DETECTED Final   Candida tropicalis NOT DETECTED NOT DETECTED Final  Urine culture     Status: Abnormal   Collection Time: 12/01/16  2:28 PM  Result Value Ref Range Status   Specimen Description URINE, RANDOM  Final   Special Requests NONE  Final   Culture >=100,000 COLONIES/mL KLEBSIELLA OXYTOCA (A)  Final   Report Status 12/03/2016 FINAL  Final   Organism ID, Bacteria KLEBSIELLA OXYTOCA (A)  Final      Susceptibility   Klebsiella oxytoca - MIC*    AMPICILLIN >=32 RESISTANT Resistant     CEFAZOLIN >=64 RESISTANT Resistant     CEFTRIAXONE <=1 SENSITIVE Sensitive     CIPROFLOXACIN <=0.25 SENSITIVE Sensitive     GENTAMICIN <=1 SENSITIVE Sensitive     IMIPENEM <=0.25 SENSITIVE Sensitive     NITROFURANTOIN 32 SENSITIVE Sensitive     TRIMETH/SULFA <=20 SENSITIVE Sensitive     AMPICILLIN/SULBACTAM 16 INTERMEDIATE Intermediate     PIP/TAZO <=4 SENSITIVE Sensitive     Extended ESBL NEGATIVE Sensitive     * >=100,000 COLONIES/mL KLEBSIELLA OXYTOCA  Culture, blood (x 2)     Status: None (Preliminary result)   Collection Time: 12/01/16  6:00 PM  Result Value Ref Range Status   Specimen Description BLOOD RIGHT ANTECUBITAL  Final   Special Requests   Final    BOTTLES DRAWN AEROBIC AND ANAEROBIC 10CC AER 5CC ANA   Culture  Setup Time   Final    GRAM NEGATIVE RODS ANAEROBIC BOTTLE ONLY CRITICAL RESULT CALLED TO, READ BACK BY AND VERIFIED WITH: V BRYK,PHARMD @0716  12/05/16 MKELLY,MLT    Culture NO GROWTH 4 DAYS  Final   Report Status PENDING  Incomplete  Culture, blood (x 2)     Status: None (Preliminary result)   Collection Time: 12/01/16  6:11 PM  Result Value Ref Range Status   Specimen Description BLOOD LEFT HAND  Final   Special Requests BOTTLES DRAWN  AEROBIC ONLY 10CC  Final   Culture NO GROWTH 4 DAYS  Final   Report Status PENDING  Incomplete  MRSA PCR  Screening     Status: None   Collection Time: 12/02/16  6:07 AM  Result Value Ref Range Status   MRSA by PCR NEGATIVE NEGATIVE Final    Comment:        The GeneXpert MRSA Assay (FDA approved for NASAL specimens only), is one component of a comprehensive MRSA colonization surveillance program. It is not intended to diagnose MRSA infection nor to guide or monitor treatment for MRSA infections.      Scheduled Meds: . apixaban  5 mg Oral Q12H  . cefTRIAXone (ROCEPHIN)  IV  2 g Intravenous Q24H  . feeding supplement (ENSURE ENLIVE)  237 mL Oral Q breakfast  . metoprolol  100 mg Oral BID  . midodrine  5 mg Oral TID WC  . mometasone-formoterol  2 puff Inhalation BID  . niacin  500 mg Oral QHS  . polyethylene glycol  17 g Oral BID  . senna-docusate  1 tablet Oral BID  . sodium chloride  1,000 mL Intravenous Once  . sodium chloride flush  3 mL Intravenous Q12H  . sodium phosphate  1 enema Rectal Once  . tamsulosin  0.4 mg Oral QHS  . tiotropium  18 mcg Inhalation Daily   Continuous Infusions: . sodium chloride 30 mL/hr at 12/04/16 1219     LOS: 4 days   Niel Hummer, md [facility] Office  [phone] Pager - Text Page per Shea Evans as per below:  On-Call/Text Page:      [email or web address]      password [email or web address]  If 7PM-7AM, please contact night-coverage [email or web address] Password [email or web address] 12/05/2016, 10:04 AM

## 2016-12-05 NOTE — Telephone Encounter (Signed)
Pt daughter calling to let Dr Lamonte Sakai know that the patient is in the Hospital for Sepsis from a bladder infection that he let go too long without treatment. Will send to Dr Lamonte Sakai as [name].

## 2016-12-05 NOTE — Discharge Instructions (Signed)
Information on my medicine - ELIQUIS® (apixaban) ° °This medication education was reviewed with me or my healthcare representative as part of my discharge preparation.  The pharmacist that spoke with me during my hospital stay was:  Shawntelle Ungar T, RPH ° °Why was Eliquis® prescribed for you? °Eliquis® was prescribed for you to reduce the risk of a blood clot forming that can cause a stroke if you have a medical condition called atrial fibrillation (a type of irregular heartbeat). ° °What do You need to know about Eliquis® ? °Take your Eliquis® TWICE DAILY - one tablet in the morning and one tablet in the evening with or without food. If you have difficulty swallowing the tablet whole please discuss with your pharmacist how to take the medication safely. ° °Take Eliquis® exactly as prescribed by your doctor and DO NOT stop taking Eliquis® without talking to the doctor who prescribed the medication.  Stopping may increase your risk of developing a stroke.  Refill your prescription before you run out. ° °After discharge, you should have regular check-up appointments with your healthcare provider that is prescribing your Eliquis®.  In the future your dose may need to be changed if your kidney function or weight changes by a significant amount or as you get older. ° °What do you do if you miss a dose? °If you miss a dose, take it as soon as you remember on the same day and resume taking twice daily.  Do not take more than one dose of ELIQUIS at the same time to make up a missed dose. ° °Important Safety Information °A possible side effect of Eliquis® is bleeding. You should call your healthcare provider right away if you experience any of the following: °? Bleeding from an injury or your nose that does not stop. °? Unusual colored urine (red or dark brown) or unusual colored stools (red or black). °? Unusual bruising for unknown reasons. °? A serious fall or if you hit your head (even if there is no bleeding). ° °Some  medicines may interact with Eliquis® and might increase your risk of bleeding or clotting while on Eliquis®. To help avoid this, consult your healthcare provider or pharmacist prior to using any new prescription or non-prescription medications, including herbals, vitamins, non-steroidal anti-inflammatory drugs (NSAIDs) and supplements. ° °This website has more information on Eliquis® (apixaban): http://www.eliquis.com/eliquis/homeInformation on my medicine - ELIQUIS® (apixaban) ° °This medication education was reviewed with me or my healthcare representative as part of my discharge preparation.  The pharmacist that spoke with me during my hospital stay was:  Andrian Urbach T, RPH ° °Why was Eliquis® prescribed for you? °Eliquis® was prescribed for you to reduce the risk of a blood clot forming that can cause a stroke if you have a medical condition called atrial fibrillation (a type of irregular heartbeat). ° °What do You need to know about Eliquis® ? °Take your Eliquis® TWICE DAILY - one tablet in the morning and one tablet in the evening with or without food. If you have difficulty swallowing the tablet whole please discuss with your pharmacist how to take the medication safely. ° °Take Eliquis® exactly as prescribed by your doctor and DO NOT stop taking Eliquis® without talking to the doctor who prescribed the medication.  Stopping may increase your risk of developing a stroke.  Refill your prescription before you run out. ° °After discharge, you should have regular check-up appointments with your healthcare provider that is prescribing your Eliquis®.  In the future your   dose may need to be changed if your kidney function or weight changes by a significant amount or as you get older. ° °What do you do if you miss a dose? °If you miss a dose, take it as soon as you remember on the same day and resume taking twice daily.  Do not take more than one dose of ELIQUIS at the same time to make up a missed  dose. ° °Important Safety Information °A possible side effect of Eliquis® is bleeding. You should call your healthcare provider right away if you experience any of the following: °? Bleeding from an injury or your nose that does not stop. °? Unusual colored urine (red or dark brown) or unusual colored stools (red or black). °? Unusual bruising for unknown reasons. °? A serious fall or if you hit your head (even if there is no bleeding). ° °Some medicines may interact with Eliquis® and might increase your risk of bleeding or clotting while on Eliquis®. To help avoid this, consult your healthcare provider or pharmacist prior to using any new prescription or non-prescription medications, including herbals, vitamins, non-steroidal anti-inflammatory drugs (NSAIDs) and supplements. ° °This website has more information on Eliquis® (apixaban): http://www.eliquis.com/eliquis/home °

## 2016-12-05 NOTE — Telephone Encounter (Signed)
Spoke to daughter to inform, she expressed acknowledgment and thanks. Aware to call if new concerns.

## 2016-12-05 NOTE — Consult Note (Signed)
[facility] CM Primary Care Navigator  12/05/2016  [name] 1929/06/02 [id number]  Met with patient at the bedside to identify possible discharge needs. Patient appears irritable (staff states had not slept all night) and would not like to be bothered. He had directed Probation officer to his daughter [name]). Per MD note, patient had fever and difficulty urinating for 3 days which led to this admission.  Contacted patient's daughter per his request.  Daughter [name]) endorsed [name] with [facility] as the primary care provider.    Daughter shared using [facility] in [place] to obtain medications with no problem.   Patient's daughter states that she is the "in home healthcare provider" for patient.   Daughter reports managing patient's medications at home, providing transportation and scheduling appointments with his doctors. She takes care of patient's needs at home as stated.   Discharge plan is home with home health services [name]).  Daughter voiced understanding to call primary care provider's office when he returns home, for a post discharge follow-up appointment within a week or sooner if needs arise. Daughter agreed to leave patient letter at the bedside for their reminder. Patient was notified of the letter left. Denies further needs or concerns at this time.  For additional questions please contact:  Edwena Felty A. Anne Boltz, BSN, RN-BC [facility] PRIMARY CARE Navigator Cell: [phone]

## 2016-12-05 NOTE — Progress Notes (Signed)
  Echocardiogram 2D Echocardiogram has been performed.  [name] 12/05/2016, 2:39 PM

## 2016-12-06 DIAGNOSIS — N170 Acute kidney failure with tubular necrosis: Secondary | ICD-10-CM

## 2016-12-06 DIAGNOSIS — R7989 Other specified abnormal findings of blood chemistry: Secondary | ICD-10-CM

## 2016-12-06 DIAGNOSIS — N183 Chronic kidney disease, stage 3 (moderate): Secondary | ICD-10-CM

## 2016-12-06 LAB — ECHOCARDIOGRAM COMPLETE
Ao-asc: 32 cm
E decel time: 162 msec
EERAT: 6.71
FS: 18 % — AB (ref 28–44)
HEIGHTINCHES: 70 in
IVS/LV PW RATIO, ED: 1.11
LA diam end sys: 43 mm
LADIAMINDEX: 2.09 cm/m2
LASIZE: 43 mm
LAVOLA4C: 51.5 mL
LV TDI E'LATERAL: 16.1
LVEEAVG: 6.71
LVEEMED: 6.71
LVELAT: 16.1 cm/s
LVOT SV: 42 mL
LVOT VTI: 13.5 cm
LVOT area: 3.14 cm2
LVOT diameter: 20 mm
LVOT peak vel: 75.7 cm/s
Lateral S' vel: 8.59 cm/s
MV Dec: 162
MVPG: 5 mmHg
MVPKAVEL: 49.4 m/s
MVPKEVEL: 108 m/s
PW: 11 mm — AB (ref 0.6–1.1)
RV TAPSE: 10.1 mm
RV sys press: 45 mmHg
Reg peak vel: 274 cm/s
TDI e' medial: 11.7
TR max vel: 274 cm/s
WEIGHTICAEL: 2973.56 [oz_av]

## 2016-12-06 LAB — COMPREHENSIVE METABOLIC PANEL
ALT: 129 U/L — AB (ref 17–63)
AST: 60 U/L — AB (ref 15–41)
Albumin: 2.6 g/dL — ABNORMAL LOW (ref 3.5–5.0)
Alkaline Phosphatase: 173 U/L — ABNORMAL HIGH (ref 38–126)
Anion gap: 10 (ref 5–15)
BILIRUBIN TOTAL: 1.1 mg/dL (ref 0.3–1.2)
BUN: 21 mg/dL — AB (ref 6–20)
CHLORIDE: 103 mmol/L (ref 101–111)
CO2: 25 mmol/L (ref 22–32)
CREATININE: 1.07 mg/dL (ref 0.61–1.24)
Calcium: 9.2 mg/dL (ref 8.9–10.3)
GFR calc Af Amer: >60 mL/min (ref >60)
Glucose, Bld: 108 mg/dL — ABNORMAL HIGH (ref 65–99)
Potassium: 4.4 mmol/L (ref 3.5–5.1)
Sodium: 138 mmol/L (ref 135–145)
Total Protein: 5.9 g/dL — ABNORMAL LOW (ref 6.5–8.1)

## 2016-12-06 LAB — CBC
HEMATOCRIT: 28.1 % — AB (ref 39.0–52.0)
Hemoglobin: 8.8 g/dL — ABNORMAL LOW (ref 13.0–17.0)
MCH: 31.8 pg (ref 26.0–34.0)
MCHC: 31.3 g/dL (ref 30.0–36.0)
MCV: 101.4 fL — AB (ref 78.0–100.0)
PLATELETS: 106 10*3/uL — AB (ref 150–400)
RBC: 2.77 MIL/uL — AB (ref 4.22–5.81)
RDW: 13.8 % (ref 11.5–15.5)
WBC: 6.6 10*3/uL (ref 4.0–10.5)

## 2016-12-06 LAB — LIPASE, BLOOD: LIPASE: 23 U/L (ref 11–51)

## 2016-12-06 LAB — CULTURE, BLOOD (ROUTINE X 2): Culture: NO GROWTH

## 2016-12-06 MED ORDER — CEFPODOXIME PROXETIL 200 MG PO TABS
400.0000 mg | ORAL_TABLET | Freq: Two times a day (BID) | ORAL | Status: DC
  Administered 2016-12-06 – 2016-12-09 (×5): 400 mg via ORAL
  Filled 2016-12-06 (×6): qty 2

## 2016-12-06 MED ORDER — POLYETHYLENE GLYCOL 3350 17 G PO PACK
17.0000 g | PACK | Freq: Every day | ORAL | Status: DC
  Administered 2016-12-07 – 2016-12-08 (×2): 17 g via ORAL
  Filled 2016-12-06 (×2): qty 1

## 2016-12-06 MED ORDER — FUROSEMIDE 20 MG PO TABS
20.0000 mg | ORAL_TABLET | Freq: Once | ORAL | Status: AC
  Administered 2016-12-06: 20 mg via ORAL
  Filled 2016-12-06: qty 1

## 2016-12-06 MED ORDER — METOPROLOL TARTRATE 5 MG/5ML IV SOLN
2.5000 mg | Freq: Once | INTRAVENOUS | Status: AC
  Administered 2016-12-07: 2.5 mg via INTRAVENOUS
  Filled 2016-12-06: qty 5

## 2016-12-06 NOTE — Consult Note (Signed)
Referring Provider: Dr. Tyrell Antonio Primary Care Physician:  Leonides Sake, MD Primary Gastroenterologist:  Althia Forts   Reason for Consultation:  Elevated LFT's  HPI: [name] is a 82 y.o. male with COPD, anemia, MGUS, and CAD who presented w/ a fever of 105, dysuria, and difficulty urinating for 3 days. In the ED pulse was 130, Temp 104.2 F, and UA was positive.  Had positive blood cultures showing GNR with Klebsiella.  LFT's elevated.  Ultrasound showed the following:    IMPRESSION: 1. Gallbladder sludge and stones without findings of acute cholecystitis. 2. No biliary dilatation. 3. Small volume right upper quadrant ascites, new from CT 4 days prior. 4. Bilateral renal cysts and stones. Findings consistent chronic medical renal disease. No hydronephrosis.  GI consulted.  LFT's completely normal 4 weeks ago.  Here they have been elevated since admission and peaked at AST 370, ALT 305, total bili 2.2, and ALP 215.  They are all trending down.  Bili normalized.  Patient denies abdominal pain.   Past Medical History:  Diagnosis Date  . Anemia of renal disease 12/05/2011  . Anemia, iron deficiency 12/05/2011  . Asthma   . CAS (cerebral atherosclerosis)    Carotid Dopplers, 04/09/2012 - Bilateral Bulb/Proximal ICAs-mild to moderate amount of fibrous plaque of fibrous soft plaque w/o evidence of a significant diameter reduction, dissection, or any other vascular abnormality  . COPD (chronic obstructive pulmonary disease) ([place])   . Coronary heart disease    2D Echo, 04/13/2013 - EF 55-60%, moderate regurg of the tricuspid valve  . Dysrhythmia 11/09/2013   SVT VS A FLUTTER   . Kidney stones   . MGUS (monoclonal gammopathy of unknown significance) 12/05/2011  . Nonspecific ST-T wave electrocardiographic changes    Lexiscan, 06/02/2012 - EKG negative for ischemia, compared to previous study-there is no significant change, normal myocardial perfusion study  . Orthostatic  hypotension 09/10/2016  . Shortness of breath   . Status post placement of implantable loop recorder 12/09/2013    Past Surgical History:  Procedure Laterality Date  . CARDIAC CATHETERIZATION  12/05/2005   Recommended CABG  . CARDIOVERSION N/A 11/09/2013   Procedure: CARDIOVERSION;  Surgeon: Pixie Casino, MD;  Location: [facility] ENDOSCOPY;  Service: Cardiovascular;  Laterality: N/A;  . CATARACT EXTRACTION    . CORONARY ARTERY BYPASS GRAFT  12/06/2005   x4, LIMA to LAD, vein to distal circumflex, sequential vein to intermediate and obtuse marginal vessel  . INNER EAR SURGERY    . KIDNEY STONE SURGERY    . LOOP RECORDER IMPLANT N/A 11/30/2013   Procedure: LOOP RECORDER IMPLANT;  Surgeon: Sanda Klein, MD;  Location: [place] CATH LAB;  Service: Cardiovascular;  Laterality: N/A;  . PERMANENT PACEMAKER INSERTION N/A 02/04/2014   Procedure: PERMANENT PACEMAKER INSERTION;  Surgeon: Sanda Klein, MD;  Location: [place] CATH LAB;  Service: Cardiovascular;  Laterality: N/A;  . TEE WITHOUT CARDIOVERSION N/A 11/09/2013   Procedure: TRANSESOPHAGEAL ECHOCARDIOGRAM (TEE);  Surgeon: Pixie Casino, MD;  Location[facility] END[facility]COPY;  Service: Cardiovascular;  Laterality: N/A;    Prior to Admission medications   Medication Sig Start Date End Date Taking? Authorizing Provider  acetaminophen (TYLENOL) 500 MG tablet Take 1,000 mg by mouth every 6 (six) hours as needed.   Yes Historical Provider, MD  albuterol (PROVENTIL HFA;VENTOLIN HFA) 108 (90 BASE) MCG/ACT inhaler Inhale 2 puffs into the lungs every 4 (four) hours as needed for wheezing or shortness of breath. 11/30/15  Yes Collene Gobble, MD  apixaban Arne Cleveland) 5  MG TABS tablet Take 1 tablet (5 mg total) by mouth every 12 (twelve) hours. 03/09/16  Yes Mihai Croitoru, MD  atorvastatin (LIPITOR) 40 MG tablet TAKE 1 TABLET (40 MG TOTAL) BY MOUTH EVERY EVENING. 04/23/16  Yes Einar Pheasant Hager, PA-C  diphenhydramine-acetaminophen (TYLENOL PM) 25-500 MG TABS Take 2 tablets by mouth  at bedtime.    Yes Historical Provider, MD  ENSURE (ENSURE) Take 1 Can by mouth every morning.   Yes Historical Provider, MD  metoprolol (LOPRESSOR) 50 MG tablet Take 1.5 tablets (75 mg total) by mouth 2 (two) times daily. 09/10/16  Yes Troy Sine, MD  midodrine (PROAMATINE) 5 MG tablet TAKE 1 TABLET (5 MG TOTAL) BY MOUTH 3 (THREE) TIMES DAILY. 04/23/16  Yes Brett Canales, PA-C  niacin (NIASPAN) 500 MG CR tablet TAKE 1 TABLET (500 MG TOTAL) BY MOUTH AT BEDTIME. 05/30/16  Yes Mihai Croitoru, MD  Probiotic Product ([employer] SENIOR PROBIOTIC PO) Take 1 capsule by mouth at bedtime. Once daily   Yes Historical Provider, MD  SYMBICORT 80-4.5 MCG/ACT inhaler INHALE 2 PUFFS INTO THE LUNGS 2 (TWO) TIMES DAILY. 06/24/16  Yes Collene Gobble, MD  Tamsulosin HCl (FLOMAX) 0.4 MG CAPS Take 0.4 mg by mouth at bedtime.    Yes Historical Provider, MD  tiotropium (SPIRIVA HANDIHALER) 18 MCG inhalation capsule PLACE 1 CAPSULE (18 MCG TOTAL) INTO INHALER AND INHALE EVERY MORNING. 11/22/16  Yes Collene Gobble, MD    Current Facility-Administered Medications  Medication Dose Route Frequency Provider Last Rate Last Dose  . acetaminophen (TYLENOL) tablet 650 mg  650 mg Oral Q6H PRN Rondel Jumbo, PA-C       Or  . acetaminophen (TYLENOL) suppository 650 mg  650 mg Rectal Q6H PRN Rondel Jumbo, PA-C      . apixaban Arne Cleveland) tablet 5 mg  5 mg Oral Q12H Rondel Jumbo, PA-C   5 mg at 12/06/16 1021  . bisacodyl (DULCOLAX) suppository 10 mg  10 mg Rectal Daily PRN Rondel Jumbo, PA-C   10 mg at 12/02/16 2142  . cefTRIAXone (ROCEPHIN) 2 g in dextrose 5 % 50 mL IVPB  2 g Intravenous Q24H Cherene Altes, MD   2 g at 12/06/16 1150  . feeding supplement (ENSURE ENLIVE) (ENSURE ENLIVE) liquid 237 mL  237 mL Oral Q breakfast Cherene Altes, MD   237 mL at 12/06/16 0820  . levalbuterol (XOPENEX) nebulizer solution 0.63 mg  0.63 mg Nebulization Q3H PRN Cherene Altes, MD      . metoprolol (LOPRESSOR) tablet 100 mg  100 mg Oral  BID Cherene Altes, MD   100 mg at 12/06/16 1021  . midodrine (PROAMATINE) tablet 5 mg  5 mg Oral TID WC Rondel Jumbo, PA-C   5 mg at 12/06/16 0820  . mometasone-formoterol (DULERA) 100-5 MCG/ACT inhaler 2 puff  2 puff Inhalation BID Rondel Jumbo, PA-C   2 puff at 12/06/16 0809  . niacin (NIASPAN) CR tablet 500 mg  500 mg Oral QHS Rondel Jumbo, PA-C   500 mg at 12/05/16 2233  . ondansetron (ZOFRAN) tablet 4 mg  4 mg Oral Q6H PRN Rondel Jumbo, PA-C       Or  . ondansetron [facility]) injection 4 mg  4 mg Intravenous Q6H PRN Rondel Jumbo, PA-C      . oxyCODONE (Oxy IR/ROXICODONE) immediate release tablet 5-10 mg  5-10 mg Oral Q4H PRN Cherene Altes, MD   10 mg  at 12/03/16 0349  . saccharomyces boulardii (FLORASTOR) capsule 250 mg  250 mg Oral BID Belkys A Regalado, MD   250 mg at 12/06/16 1020  . sodium chloride flush (NS) 0.9 % injection 3 mL  3 mL Intravenous Q12H Rondel Jumbo, PA-C   3 mL at 12/06/16 1000  . sodium phosphate (FLEET) 7-19 GM/118ML enema 1 enema  1 enema Rectal Once Rhetta Mura Schorr, NP      . tamsulosin (FLOMAX) capsule 0.4 mg  0.4 mg Oral QHS Rondel Jumbo, PA-C   0.4 mg at 12/05/16 2233  . tiotropium (SPIRIVA) inhalation capsule 18 mcg  18 mcg Inhalation Daily Rondel Jumbo, PA-C   18 mcg at 12/06/16 0809  . traMADol (ULTRAM) tablet 50 mg  50 mg Oral Q6H PRN Cherene Altes, MD      . traZODone (DESYREL) tablet 25 mg  25 mg Oral QHS PRN Rondel Jumbo, PA-C   25 mg at 12/04/16 0402    Allergies as of 12/01/2016  . (No Known Allergies)    Family History  Problem Relation Age of Onset  . Tuberculosis Father   . Heart attack Father   . Heart disease Mother   . Heart attack Mother   . Heart disease Brother   . Emphysema Daughter     premature without tobacco exposure    Social History   Social History  . Marital status: Married    Spouse name: N/A  . Number of children: N/A  . Years of education: N/A   Occupational History  . retired long  distance Administrator   . maintenence    Social History Main Topics  . Smoking status: Former Smoker    Packs/day: 1.00    Years: 47.00    Types: Cigarettes    Start date: 12/02/1951    Quit date: 12/30/1993  . Smokeless tobacco: Never Used     Comment: quit 20 years ago  . Alcohol use No  . Drug use: No  . Sexual activity: Not on file   Other Topics Concern  . Not on file   Social History Narrative  . No narrative on file    Review of Systems: ROS is O/W negative except as mentioned in HPI.  Physical Exam: Vital signs in last 24 hours: Temp:  [97.7 F (36.5 C)-98 F (36.7 C)] 97.7 F (36.5 C) (12/08 0500) Pulse Rate:  [115-118] 118 (12/08 1020) Resp:  [17-20] 20 (12/08 0500) BP: (114-134)/(63-93) 130/84 (12/08 1020) SpO2:  [96 %-99 %] 96 % (12/08 0810) Last BM Date: 12/05/16 General:  Alert, Well-developed, well-nourished, pleasant and cooperative in NAD Head:  Normocephalic and atraumatic. Eyes:  Sclera clear, no icterus.  Conjunctiva pink. Ears:  Normal auditory acuity. Mouth:  No deformity or lesions.  Lungs:  Clear throughout to auscultation.  No wheezes, crackles, or rhonchi.  No increased WOB. Heart:  Regular rate and rhythm; no murmurs, clicks, rubs, or gallops. Abdomen:  Soft, non-distended.  BS present.  Non-tender. Rectal:  Deferred  Msk:  Symmetrical without gross deformities. Pulses:  Normal pulses noted. Extremities:  Without clubbing or edema. Neurologic:  Alert and oriented x 4;  grossly normal neurologically. Skin:  Intact without significant lesions or rashes. Psych:  Alert and cooperative. Normal mood and affect.  Intake/Output from previous day: 12/07 0701 - 12/08 0700 In: 240 [P.O.:240] Out: 750 [Urine:750] Intake/Output this shift: Total I/O In: -  Out: 300 [Urine:300]  Lab Results:  Recent Labs  12/05/16 0259 12/06/16 0330  WBC 9.5 6.6  HGB 9.2* 8.8*  HCT 28.8* 28.1*  PLT 114* 106*   BMET  Recent Labs  12/05/16 0259  12/06/16 0330  NA 140 138  K 4.5 4.4  CL 107 103  CO2 27 25  GLUCOSE 111* 108*  BUN 21* 21*  CREATININE 1.12 1.07  CALCIUM 9.0 9.2   LFT  Recent Labs  12/06/16 0330  PROT 5.9*  ALBUMIN 2.6*  AST 60*  ALT 129*  ALKPHOS 173*  BILITOT 1.1   Studies/Results: US Abdomen Complete  Result Date: 12/06/2016 CLINICAL DATA:  Abnormal transaminases. EXAM: ABDOMEN ULTRASOUND COMPLETE COMPARISON:  Noncontrast CT 12/01/2016 FINDINGS: Gallbladder: Physiologically distended containing sludge and small stones. No gallbladder wall thickening. No sonographic Murphy sign noted by sonographer. Common bile duct: Diameter: 4.8 mm. Liver: 1.6 cm cyst in the left lobe again seen. Mildly heterogeneous in parenchymal echogenicity. Normal directional flow in the main portal vein. IVC: No abnormality visualized. Pancreas: Visualized portion unremarkable. Spleen: Size and appearance within normal limits. Right Kidney: Length: 12.4 cm. No hydronephrosis. There is cortical thinning and increased echogenicity. Nonobstructing 8 mm calculus. There is a 1.6 cm cyst in the interpolar region. Left Kidney: Length: 12.1 cm. No hydronephrosis. There is cortical thinning and increased echogenicity. Shadowing nonobstructing 8 mm calculus medially. 1.3 cm cyst in the lower kidney. Abdominal aorta: No aneurysm visualized. Other findings:   There is right upper quadrant ascites. IMPRESSION: 1. Gallbladder sludge and stones without findings of acute cholecystitis. 2. No biliary dilatation. 3. Small volume right upper quadrant ascites, new from CT 4 days prior. 4. Bilateral renal cysts and stones. Findings consistent chronic medical renal disease. No hydronephrosis. Electronically Signed   By: Jeb Levering M.D.   On: 12/06/2016 01:23   IMPRESSION:  *82 year old male admitted with dysuria, high fevers, found to have sepsis/bacteremia due to Klebsiella UTI.   -Elevated LFT's likely reactive/related to sepsis.  Are actually trending  down nicely.  Ultrasound shows gallstones/sludge, but patient has no abdominal pain. -Sepsis due to Klebsiella UTI w/ bacteremia:  On rocephin.   PLAN: -Monitor LFT's. -Continue to treat UTI/sepsis with abx. -Will discuss with Dr. Fuller Plan, but doubt that any other evaluation will be necessary at this time.  ZEHR, JESSICA D.  12/06/2016, 12:42 PM  Pager number [phone]     Attending physician's note   I have taken a history, examined the patient and reviewed the chart. I agree with the Advanced Practitioner's note, impression and recommendations. Elevated LFTs are likely reactive to urosepsis and they are trending downward. Gallstones appear to be asymptomatic. US showed a nondilated CBD measuring 4.8 mm. Would monitor LFTs as inpatient and with PCP after discharge. If they remain elevated after 2 months outpatient GI follow up would be appropriate. GI signing off.   Lucio Edward, MD Mar[facility] Regal [phone] Mon-Fri 8a-5p [phone] after 5p, weekends, holidays

## 2016-12-06 NOTE — Progress Notes (Signed)
PT Cancellation Note  Patient Details Name: [name] MRN: [id number] DOB: [birth year]   Cancelled Treatment:    Reason Eval/Treat Not Completed: Fatigue/lethargy limiting ability to participate   Duncan Dull 12/06/2016, 4:10 PM Alben Deeds, [place] DPT  [phone]

## 2016-12-06 NOTE — Progress Notes (Signed)
[name]  [id number] DOB: 02-23-1929 DOA: 12/01/2016 PCP: Leonides Sake, MD    Brief Narrative:  82 year old with COPD, anemia, MGUS, and CAD who presented w/ a fever of 105, dysuria, and difficulty urinating for 3 days. In the ED Pulse was 130, Temp 104.2 F, and UA was positive.    Subjective: He is doing well, denies abdominal pain.  Report difficulty emptying bladder.   Assessment & Plan:  Sepsis due to Klebsiella UTI w/ bacteremia  Sepsis physiology has resolved - continue directed antibiotic therapy On  ceftriaxone. Repeated blood culture negative  Change antibiotics to oral. Needs 2 weeks treatment.   Leukopenia in the setting of sepsis WBC 0.8 with ANC 0.7 - Heme following - due to gram neg bacteremia - now corrected Resolved.   Transaminitis ? Mild shock liver - improving w/ volume resuscitation  RUQ [place] gallstone. Will consult GI.   CAD; supposed to get ECHO this month. This was ordered. Echo with normal ef.   MGUS followed by Dr. Marin Olp - noncontributory   COPD  Well compensated  Acute kidney injury on Chronic kidney disease baseline creatinine 1.2-1.4 - crt has improved with volume resuscitation -  Recent Labs Lab 12/01/16 1749 12/02/16 0212 12/03/16 0325 12/05/16 0259 12/06/16 0330  CREATININE 1.71* 1.87* 1.36* 1.12 1.07  improved.  Ascites  on [place] will give one time dose of lasix.   CAD s/p CABG 2006 Asymptomatic at this time   Parox SVT - recurrent A flutter  S/p [place] on chronic eliquis - remains in sinus tachy at present - increase beta blocker and follow  Anemia of chronic disease and iron deficiency Care per Heme - no indication for transfusion at this time     Hyperlipidemia Continue home statins  Orthostatic Hypotension  On chronic midodrine   DVT prophylaxis: eliquis  Code Status: FULL CODE Family Communication: daughter 12-07 Disposition Plan: home in 24 hours.   Consultants:   Heme/Onc  Procedures: none  Antimicrobials:  Zosyn 12/3  Vanc 12/3 Rocephin 12/4 >  Objective: Blood pressure 116/63, pulse (!) 117, temperature 97.8 F (36.6 C), temperature source Oral, resp. rate 19, height 5\' 10"  (1.778 m), weight 84.3 kg (185 lb 13.6 oz), SpO2 98 %.  Intake/Output Summary (Last 24 hours) at 12/06/16 1432 Last data filed at 12/06/16 1411  Gross per 24 hour  Intake                0 ml  Output             1050 ml  Net            -1050 ml   Filed Weights   12/01/16 1418 12/02/16 0613  Weight: 87.1 kg (192 lb) 84.3 kg (185 lb 13.6 oz)    Examination: General: No acute respiratory distress - alert Lungs: Clear to auscultation bilaterally  Cardiovascular: Regular but tachycardiac at 110bpm - no M or gallop Abdomen: Nontender, nondistended, soft, bowel sounds positive, no rebound Extremities: No significant edema bilateral lower extremities  CBC:  Recent Labs Lab 12/01/16 1414 12/01/16 1749 12/02/16 0212 12/03/16 0325 12/05/16 0259 12/06/16 0330  WBC 0.8* 5.1 17.7* 19.9* 9.5 6.6  NEUTROABS 0.7* 5.0  --   --   --   --   HGB 9.8* 9.6* 9.0* 9.1* 9.2* 8.8*  HCT 30.9* 29.8* 27.8* 28.5* 28.8* 28.1*  MCV 97.8 99.3 99.3 99.3 100.3* 101.4*  PLT 115* 97* 97* 98* 114* [id number]*   Basic Metabolic Panel:  Recent Labs Lab 12/01/16 1749 12/02/16 0212 12/03/16 0325 12/05/16 0259 12/06/16 0330  NA 138 137 138 140 138  K 3.4* 4.6 4.3 4.5 4.4  CL 107 106 105 107 103  CO2 20* 20* 23 27 25   GLUCOSE 135* 121* 127* 111* 108*  BUN 32* 35* 36* 21* 21*  CREATININE 1.71* 1.87* 1.36* 1.12 1.07  CALCIUM 8.2* 8.1* 8.8* 9.0 9.2   GFR: Estimated Creatinine Clearance: 52.1 mL/min (by C-G formula based on SCr of 1.07 mg/dL).  Liver Function Tests:  Recent Labs Lab 12/01/16 1749 12/02/16 0212 12/03/16 0325 12/05/16 0259 12/06/16 0330  AST 368* 336* 171* 103* 60*  ALT 310* 326* 243* 171* 129*  ALKPHOS 207* 190* 215* 198* 173*  BILITOT 1.9* 2.2* 1.3* 0.7 1.1   PROT 5.9* 6.1* 6.3* 6.0* 5.9*  ALBUMIN 2.6* 2.9* 2.7* 2.6* 2.6*    Coagulation Profile:  Recent Labs Lab 12/01/16 1749 12/02/16 0212  INR 1.86 1.90    HbA1C: Hgb A1c MFr Bld  Date/Time Value Ref Range Status  07/17/2009 01:00 PM  4.6 - 6.1 % Final   5.9 (NOTE) The ADA recommends the following therapeutic goal for glycemic control related to Hgb A1c measurement: Goal of therapy: <6.5 Hgb A1c  Reference: American Diabetes Association: Clinical Practice Recommendations 2010, Diabetes Care, 2010, 33: (Suppl  1).    CBG:  Recent Labs Lab 12/02/16 0608 12/04/16 2053  GLUCAP 104* 126*    Recent Results (from the past 240 hour(s))  Blood Culture (routine x 2)     Status: Abnormal   Collection Time: 12/01/16  2:14 PM  Result Value Ref Range Status   Specimen Description BLOOD RIGHT ANTECUBITAL  Final   Special Requests BOTTLES DRAWN AEROBIC AND ANAEROBIC 5CC  Final   Culture  Setup Time   Final    IN BOTH AEROBIC AND ANAEROBIC BOTTLES GRAM NEGATIVE RODS CRITICAL RESULT CALLED TO, READ BACK BY AND VERIFIED WITH: RTollie Eth AT 0710 ON [id number] BY S. YARBROUGH    Culture KLEBSIELLA OXYTOCA (A)  Final   Report Status 12/04/2016 FINAL  Final   Organism ID, Bacteria KLEBSIELLA OXYTOCA  Final      Susceptibility   Klebsiella oxytoca - MIC*    AMPICILLIN >=32 RESISTANT Resistant     CEFAZOLIN >=64 RESISTANT Resistant     CEFEPIME <=1 SENSITIVE Sensitive     CEFTAZIDIME <=1 SENSITIVE Sensitive     CEFTRIAXONE <=1 SENSITIVE Sensitive     CIPROFLOXACIN <=0.25 SENSITIVE Sensitive     GENTAMICIN <=1 SENSITIVE Sensitive     IMIPENEM <=0.25 SENSITIVE Sensitive     TRIMETH/SULFA <=20 SENSITIVE Sensitive     AMPICILLIN/SULBACTAM 16 INTERMEDIATE Intermediate     PIP/TAZO <=4 SENSITIVE Sensitive     Extended ESBL NEGATIVE Sensitive     * KLEBSIELLA OXYTOCA  Blood Culture (routine x 2)     Status: Abnormal   Collection Time: 12/01/16  2:14 PM  Result Value Ref Range Status    Specimen Description BLOOD RIGHT ANTECUBITAL  Final   Special Requests BOTTLES DRAWN AEROBIC AND ANAEROBIC 5CC  Final   Culture  Setup Time   Final    IN BOTH AEROBIC AND ANAEROBIC BOTTLES GRAM NEGATIVE RODS CRITICAL RESULT CALLED TO, READ BACK BY AND VERIFIED WITH: R. CLARK, PHARM AT 0710 ON [id number] BY S. YARBROOUGH    Culture (A)  Final    KLEBSIELLA OXYTOCA SUSCEPTIBILITIES PERFORMED ON PREVIOUS CULTURE WITHIN THE LAST 5 DAYS.    Report Status 12/04/2016 FINAL  Final  Blood Culture ID Panel (Reflexed)     Status: Abnormal   Collection Time: 12/01/16  2:14 PM  Result Value Ref Range Status   Enterococcus species NOT DETECTED NOT DETECTED Final   Listeria monocytogenes NOT DETECTED NOT DETECTED Final   Staphylococcus species NOT DETECTED NOT DETECTED Final   Staphylococcus aureus NOT DETECTED NOT DETECTED Final   Streptococcus species NOT DETECTED NOT DETECTED Final   Streptococcus agalactiae NOT DETECTED NOT DETECTED Final   Streptococcus pneumoniae NOT DETECTED NOT DETECTED Final   Streptococcus pyogenes NOT DETECTED NOT DETECTED Final   Acinetobacter baumannii NOT DETECTED NOT DETECTED Final   Enterobacteriaceae species DETECTED (A) NOT DETECTED Final    Comment: CRITICAL RESULT CALLED TO, READ BACK BY AND VERIFIED WITH: R. CLARK, PHARM AT 0710 ON [id number] BY S. YARBROUGH    Enterobacter cloacae complex NOT DETECTED NOT DETECTED Final   Escherichia coli NOT DETECTED NOT DETECTED Final   Klebsiella oxytoca DETECTED (A) NOT DETECTED Final    Comment: CRITICAL RESULT CALLED TO, READ BACK BY AND VERIFIED WITH: R. CLARK, PHARM AT 0710 ON [id number] BY S. YARBROUGH    Klebsiella pneumoniae NOT DETECTED NOT DETECTED Final   Proteus species NOT DETECTED NOT DETECTED Final   Serratia marcescens NOT DETECTED NOT DETECTED Final   Carbapenem resistance NOT DETECTED NOT DETECTED Final   Haemophilus influenzae NOT DETECTED NOT DETECTED Final   Neisseria meningitidis NOT DETECTED NOT DETECTED  Final   Pseudomonas aeruginosa NOT DETECTED NOT DETECTED Final   Candida albicans NOT DETECTED NOT DETECTED Final   Candida glabrata NOT DETECTED NOT DETECTED Final   Candida krusei NOT DETECTED NOT DETECTED Final   Candida parapsilosis NOT DETECTED NOT DETECTED Final   Candida tropicalis NOT DETECTED NOT DETECTED Final  Urine culture     Status: Abnormal   Collection Time: 12/01/16  2:28 PM  Result Value Ref Range Status   Specimen Description URINE, RANDOM  Final   Special Requests NONE  Final   Culture >=100,000 COLONIES/mL KLEBSIELLA OXYTOCA (A)  Final   Report Status 12/03/2016 FINAL  Final   Organism ID, Bacteria KLEBSIELLA OXYTOCA (A)  Final      Susceptibility   Klebsiella oxytoca - MIC*    AMPICILLIN >=32 RESISTANT Resistant     CEFAZOLIN >=64 RESISTANT Resistant     CEFTRIAXONE <=1 SENSITIVE Sensitive     CIPROFLOXACIN <=0.25 SENSITIVE Sensitive     GENTAMICIN <=1 SENSITIVE Sensitive     IMIPENEM <=0.25 SENSITIVE Sensitive     NITROFURANTOIN 32 SENSITIVE Sensitive     TRIMETH/SULFA <=20 SENSITIVE Sensitive     AMPICILLIN/SULBACTAM 16 INTERMEDIATE Intermediate     PIP/TAZO <=4 SENSITIVE Sensitive     Extended ESBL NEGATIVE Sensitive     * >=100,000 COLONIES/mL KLEBSIELLA OXYTOCA  Culture, blood (x 2)     Status: Abnormal (Preliminary result)   Collection Time: 12/01/16  6:00 PM  Result Value Ref Range Status   Specimen Description BLOOD RIGHT ANTECUBITAL  Final   Special Requests   Final    BOTTLES DRAWN AEROBIC AND ANAEROBIC 10CC AER 5CC ANA   Culture  Setup Time   Final    GRAM NEGATIVE RODS ANAEROBIC BOTTLE ONLY CRITICAL RESULT CALLED TO, READ BACK BY AND VERIFIED WITH: V BRYK,PHARMD @0716  12/05/16 MKELLY,MLT    Culture KLEBSIELLA OXYTOCA (A)  Final   Report Status PENDING  Incomplete  Culture, blood (x 2)     Status: None   Collection Time: 12/01/16  6:11  PM  Result Value Ref Range Status   Specimen Description BLOOD LEFT HAND  Final   Special Requests  BOTTLES DRAWN AEROBIC ONLY 10CC  Final   Culture NO GROWTH 5 DAYS  Final   Report Status 12/06/2016 FINAL  Final  MRSA PCR Screening     Status: None   Collection Time: 12/02/16  6:07 AM  Result Value Ref Range Status   MRSA by PCR NEGATIVE NEGATIVE Final    Comment:        The GeneXpert MRSA Assay (FDA approved for NASAL specimens only), is one component of a comprehensive MRSA colonization surveillance program. It is not intended to diagnose MRSA infection nor to guide or monitor treatment for MRSA infections.   Culture, blood (routine x 2)     Status: None (Preliminary result)   Collection Time: 12/05/16  3:04 PM  Result Value Ref Range Status   Specimen Description BLOOD LEFT HAND  Final   Special Requests IN PEDIATRIC BOTTLE 2CC  Final   Culture NO GROWTH < 24 HOURS  Final   Report Status PENDING  Incomplete  Culture, blood (routine x 2)     Status: None (Preliminary result)   Collection Time: 12/05/16  3:10 PM  Result Value Ref Range Status   Specimen Description BLOOD LEFT ANTECUBITAL  Final   Special Requests IN PEDIATRIC BOTTLE 2CC  Final   Culture NO GROWTH < 24 HOURS  Final   Report Status PENDING  Incomplete     Scheduled Meds: . apixaban  5 mg Oral Q12H  . cefTRIAXone (ROCEPHIN)  IV  2 g Intravenous Q24H  . feeding supplement (ENSURE ENLIVE)  237 mL Oral Q breakfast  . metoprolol  100 mg Oral BID  . midodrine  5 mg Oral TID WC  . mometasone-formoterol  2 puff Inhalation BID  . niacin  500 mg Oral QHS  . saccharomyces boulardii  250 mg Oral BID  . sodium chloride flush  3 mL Intravenous Q12H  . sodium phosphate  1 enema Rectal Once  . tamsulosin  0.4 mg Oral QHS  . tiotropium  18 mcg Inhalation Daily   Continuous Infusions:    LOS: 5 days   Niel Hummer MD [facility] Office  [phone] Pager - Text Page per Shea Evans as per below:  On-Call/Text Page:      [email or web address]      password [email or web address]  If 7PM-7AM, please contact  night-coverage [email or web address] Password [facility] 12/06/2016, 2:32 PM

## 2016-12-07 LAB — BASIC METABOLIC PANEL
ANION GAP: 7 (ref 5–15)
BUN: 20 mg/dL (ref 6–20)
CHLORIDE: 103 mmol/L (ref 101–111)
CO2: 29 mmol/L (ref 22–32)
Calcium: 8.9 mg/dL (ref 8.9–10.3)
Creatinine, Ser: 1 mg/dL (ref 0.61–1.24)
Glucose, Bld: 97 mg/dL (ref 65–99)
POTASSIUM: 4.1 mmol/L (ref 3.5–5.1)
SODIUM: 139 mmol/L (ref 135–145)

## 2016-12-07 LAB — CULTURE, BLOOD (ROUTINE X 2)

## 2016-12-07 LAB — PREPARE RBC (CROSSMATCH)

## 2016-12-07 LAB — CBC
HEMATOCRIT: 27 % — AB (ref 39.0–52.0)
Hemoglobin: 8.5 g/dL — ABNORMAL LOW (ref 13.0–17.0)
MCH: 32 pg (ref 26.0–34.0)
MCHC: 31.5 g/dL (ref 30.0–36.0)
MCV: 101.5 fL — AB (ref 78.0–100.0)
Platelets: 115 10*3/uL — ABNORMAL LOW (ref 150–400)
RBC: 2.66 MIL/uL — ABNORMAL LOW (ref 4.22–5.81)
RDW: 14 % (ref 11.5–15.5)
WBC: 5.1 10*3/uL (ref 4.0–10.5)

## 2016-12-07 MED ORDER — SODIUM CHLORIDE 0.9 % IV SOLN: Freq: Once | INTRAVENOUS | Status: DC

## 2016-12-07 MED ORDER — FUROSEMIDE 10 MG/ML IJ SOLN
20.0000 mg | Freq: Once | INTRAMUSCULAR | Status: AC
  Administered 2016-12-07: 20 mg via INTRAVENOUS
  Filled 2016-12-07: qty 2

## 2016-12-07 MED ORDER — SODIUM CHLORIDE 0.9 % IV SOLN
Freq: Once | INTRAVENOUS | Status: AC
  Administered 2016-12-07: 10 mL/h via INTRAVENOUS

## 2016-12-07 NOTE — Progress Notes (Signed)
PT Cancellation Note  Patient Details Name: DEWAND VERDELL MRN: [id number] DOB: 1929/09/24   Cancelled Treatment:    Reason Eval/Treat Not Completed: Patient declined, no reason specified (will check back later today as able)  Malka So, PT [phone]  Jashad Depaula 12/07/2016, 12:06 PM

## 2016-12-07 NOTE — Progress Notes (Signed)
Pt tolerated transfusion well (unit W [id number]), no sign of apparent distress noted. VS after transfusion are stable and charted in the flowsheets. Pt resting in bed eyes closed.

## 2016-12-07 NOTE — Progress Notes (Signed)
Physical Therapy Treatment Patient Details Name: [name] MRN: [id number] DOB: 1929/05/15 Today's Date: 12/07/2016    History of Present Illness 82 yo admitted with UTI and sepsis. PMhx: CAD, CABG, pacemaker, anemia, COPD, emphysema    PT Comments    Pt with incr participation this afternoon.  He demo decr mobility and activity tolerance compared to prior level of function.  Elevated HR with all tasks today.  Will continue to follow patient while on this venue of care to progress mobility.   Follow Up Recommendations  Home health PT;Supervision/Assistance - 24 hour     Equipment Recommendations  3in1 (PT)    Recommendations for Other Services       Precautions / Restrictions Precautions Precautions: Fall Precaution Comments: watch HR Restrictions Weight Bearing Restrictions: No    Mobility  Bed Mobility Overal bed mobility: Needs Assistance Bed Mobility: Supine to Sit;Sit to Supine     Supine to sit: Min guard Sit to supine: Supervision   General bed mobility comments: use of rail  Transfers Overall transfer level: Needs assistance Equipment used: Rolling walker (2 wheeled) Transfers: Sit to/from [facility] Sit to Stand: Min guard Stand pivot transfers: Min guard (from [facility])       General transfer comment: min cues for safety during transfers  Ambulation/Gait Ambulation/Gait assistance: Min guard Ambulation Distance (Feet): 110 Feet Assistive device: Rolling walker (2 wheeled) Gait Pattern/deviations: Trunk flexed;Decreased stride length Gait velocity: decreased    General Gait Details: 2 standing rest breaks, reliance on RW for balance   Stairs            Wheelchair Mobility    Modified Rankin (Stroke Patients Only)       Balance Overall balance assessment: Needs assistance Sitting-balance support: No upper extremity supported;Feet supported Sitting balance-Leahy Scale: Good     Standing balance support:  Bilateral upper extremity supported Standing balance-Leahy Scale: Good Standing balance comment: performed perianal hygeine supervision                    Cognition Arousal/Alertness: Awake/alert Behavior During Therapy: WFL for tasks assessed/performed Overall Cognitive Status: Within Functional Limits for tasks assessed                      Exercises      General Comments        Pertinent Vitals/Pain Pain Assessment: No/denies pain    Home Living Family/patient expects to be discharged to:: Private residence Living Arrangements: Children Available Help at Discharge: Family;Available PRN/intermittently Type of Home: House Home Access: Stairs to enter Entrance Stairs-Rails: Right;Left Home Layout: One level Home Equipment: Shower seat;Cane - single point;Walker - 2 wheels Additional Comments: dau is also his hired caregiver 5 days/week    Prior Function Level of Independence: Needs assistance    ADL's / Homemaking Assistance Needed: assist with bathing, dressing Comments: dau drives him to weekly chair volleyball, work at weekend [employer] and weekly seated bowling   PT Goals (current goals can now be found in the care plan section) Acute Rehab PT Goals Patient Stated Goal: return home PT Goal Formulation: With patient Time For Goal Achievement: 12/18/16 Potential to Achieve Goals: Good    Frequency    Min 3X/week      PT Plan      Co-evaluation             End of Session Equipment Utilized During Treatment: Gait belt Activity Tolerance: Patient tolerated treatment well Patient  left: in bed;with call bell/phone within reach     Time: 1335-1359 PT Time Calculation (min) (ACUTE ONLY): 24 min  Charges:  $Gait Training: 8-22 mins $Therapeutic Activity: 8-22 mins                    G CodesMalka So, PT [phone]  [place] 12/07/2016, 3:31 PM

## 2016-12-07 NOTE — Progress Notes (Signed)
[name]  [id number] DOB: October 10, 1929 DOA: 12/01/2016 PCP: Leonides Sake, MD    Brief Narrative:  82 year old with COPD, anemia, MGUS, and CAD who presented w/ a fever of 105, dysuria, and difficulty urinating for 3 days. In the ED Pulse was 130, Temp 104.2 F, and UA was positive.    Subjective: He is doing well, denies abdominal pain.    Assessment & Plan:  Sepsis due to Klebsiella UTI w/ bacteremia  Sepsis physiology has resolved - continue directed antibiotic therapy On  ceftriaxone. Repeated blood culture negative  Change antibiotics to oral. Needs 2 weeks treatment.   Leukopenia in the setting of sepsis WBC 0.8 with ANC 0.7 - Heme following - due to gram neg bacteremia - now corrected Resolved.   Transaminitis ? Mild shock liver - improving w/ volume resuscitation  RUQ [place] gallstone. No further evaluation.  Outpatient follow up.    CAD; supposed to get ECHO this month. This was ordered. Echo with normal ef.   MGUS followed by Dr. Marin Olp - noncontributory   COPD  Well compensated  Acute kidney injury on Chronic kidney disease baseline creatinine 1.2-1.4 - crt has improved with volume resuscitation -  Recent Labs Lab 12/02/16 0212 12/03/16 0325 12/05/16 0259 12/06/16 0330 12/07/16 0324  CREATININE 1.87* 1.36* 1.12 1.07 1.00  improved.  Ascites  on [place] will give one time dose of lasix.   CAD s/p CABG 2006 Asymptomatic at this time   Parox SVT - recurrent A flutter  S/p [place] on chronic eliquis - remains in sinus tachy at present - increase beta blocker and follow  Anemia of chronic disease and iron deficiency Care per Heme - will consider one unit PRBC, patient with weakness. Awaiting call back with daughter to discussed.     Hyperlipidemia Continue home statins  Orthostatic Hypotension  On chronic midodrine   DVT prophylaxis: eliquis  Code Status: FULL CODE Family Communication: daughter 12-07 Disposition Plan: home in 24  hours.   Consultants:  Heme/Onc  Procedures: none  Antimicrobials:  Zosyn 12/3  Vanc 12/3 Rocephin 12/4 >  Objective: Blood pressure 114/63, pulse (!) 117, temperature 98.3 F (36.8 C), temperature source Oral, resp. rate 18, height 5\' 10"  (1.778 m), weight 84.3 kg (185 lb 13.6 oz), SpO2 90 %.  Intake/Output Summary (Last 24 hours) at 12/07/16 1355 Last data filed at 12/07/16 1331  Gross per 24 hour  Intake              600 ml  Output             1150 ml  Net             -550 ml   Filed Weights   12/01/16 1418 12/02/16 0613  Weight: 87.1 kg (192 lb) 84.3 kg (185 lb 13.6 oz)    Examination: General: No acute respiratory distress - alert Lungs: Clear to auscultation bilaterally  Cardiovascular: Regular but tachycardiac at 110bpm - no M or gallop Abdomen: Nontender, nondistended, soft, bowel sounds positive, no rebound Extremities: No significant edema bilateral lower extremities  CBC:  Recent Labs Lab 12/01/16 1414 12/01/16 1749 12/02/16 0212 12/03/16 0325 12/05/16 0259 12/06/16 0330 12/07/16 0324  WBC 0.8* 5.1 17.7* 19.9* 9.5 6.6 5.1  NEUTROABS 0.7* 5.0  --   --   --   --   --   HGB 9.8* 9.6* 9.0* 9.1* 9.2* 8.8* 8.5*  HCT 30.9* 29.8* 27.8* 28.5* 28.8* 28.1* 27.0*  MCV 97.8 99.3 99.3 99.3  100.3* 101.4* 101.5*  PLT 115* 97* 97* 98* 114* 106* [id number]*   Basic Metabolic Panel:  Recent Labs Lab 12/02/16 0212 12/03/16 0325 12/05/16 0259 12/06/16 0330 12/07/16 0324  NA 137 138 140 138 139  K 4.6 4.3 4.5 4.4 4.1  CL 106 105 107 103 103  CO2 20* 23 27 25 29   GLUCOSE 121* 127* 111* 108* 97  BUN 35* 36* 21* 21* 20  CREATININE 1.87* 1.36* 1.12 1.07 1.00  CALCIUM 8.1* 8.8* 9.0 9.2 8.9   GFR: Estimated Creatinine Clearance: 55.8 mL/min (by C-G formula based on SCr of 1 mg/dL).  Liver Function Tests:  Recent Labs Lab 12/01/16 1749 12/02/16 0212 12/03/16 0325 12/05/16 0259 12/06/16 0330  AST 368* 336* 171* 103* 60*  ALT 310* 326* 243* 171* 129*    ALKPHOS 207* 190* 215* 198* 173*  BILITOT 1.9* 2.2* 1.3* 0.7 1.1  PROT 5.9* 6.1* 6.3* 6.0* 5.9*  ALBUMIN 2.6* 2.9* 2.7* 2.6* 2.6*    Coagulation Profile:  Recent Labs Lab 12/01/16 1749 12/02/16 0212  INR 1.86 1.90    HbA1C: Hgb A1c MFr Bld  Date/Time Value Ref Range Status  07/17/2009 01:00 PM  4.6 - 6.1 % Final   5.9 (NOTE) The ADA recommends the following therapeutic goal for glycemic control related to Hgb A1c measurement: Goal of therapy: <6.5 Hgb A1c  Reference: American Diabetes Association: Clinical Practice Recommendations 2010, Diabetes Care, 2010, 33: (Suppl  1).    CBG:  Recent Labs Lab 12/02/16 0608 12/04/16 2053  GLUCAP 104* 126*    Recent Results (from the past 240 hour(s))  Blood Culture (routine x 2)     Status: Abnormal   Collection Time: 12/01/16  2:14 PM  Result Value Ref Range Status   Specimen Description BLOOD RIGHT ANTECUBITAL  Final   Special Requests BOTTLES DRAWN AEROBIC AND ANAEROBIC 5CC  Final   Culture  Setup Time   Final    IN BOTH AEROBIC AND ANAEROBIC BOTTLES GRAM NEGATIVE RODS CRITICAL RESULT CALLED TO, READ BACK BY AND VERIFIED WITH: RTollie Eth AT [facility] ON CR:[id number] BY S. YARBROUGH    Culture KLEBSIELLA OXYTOCA (A)  Final   Report Status 12/04/2016 FINAL  Final   Organism ID, Bacteria KLEBSIELLA OXYTOCA  Final      Susceptibility   Klebsiella oxytoca - MIC*    AMPICILLIN >=32 RESISTANT Resistant     CEFAZOLIN >=64 RESISTANT Resistant     CEFEPIME <=1 SENSITIVE Sensitive     CEFTAZIDIME <=1 SENSITIVE Sensitive     CEFTRIAXONE <=1 SENSITIVE Sensitive     CIPROFLOXACIN <=0.25 SENSITIVE Sensitive     GENTAMICIN <=1 SENSITIVE Sensitive     IMIPENEM <=0.25 SENSITIVE Sensitive     TRIMETH/SULFA <=20 SENSITIVE Sensitive     AMPICILLIN/SULBACTAM 16 INTERMEDIATE Intermediate     PIP/TAZO <=4 SENSITIVE Sensitive     Extended ESBL NEGATIVE Sensitive     * KLEBSIELLA OXYTOCA  Blood Culture (routine x 2)     Status: Abnormal    Collection Time: 12/01/16  2:14 PM  Result Value Ref Range Status   Specimen Description BLOOD RIGHT ANTECUBITAL  Final   Special Requests BOTTLES DRAWN AEROBIC AND ANAEROBIC 5CC  Final   Culture  Setup Time   Final    IN BOTH AEROBIC AND ANAEROBIC BOTTLES GRAM NEGATIVE RODS CRITICAL RESULT CALLED TO, READ BACK BY AND VERIFIED WITH: [place], PHARM AT [place] [id number] BY Hervey Ard    Culture (A)  Final  KLEBSIELLA OXYTOCA SUSCEPTIBILITIES PERFORMED ON PREVIOUS CULTURE WITHIN THE LAST 5 DAYS.    Report Status 12/04/2016 FINAL  Final  Blood Culture ID Panel (Reflexed)     Status: Abnormal   Collection Time: 12/01/16  2:14 PM  Result Value Ref Range Status   Enterococcus species NOT DETECTED NOT DETECTED Final   Listeria monocytogenes NOT DETECTED NOT DETECTED Final   Staphylococcus species NOT DETECTED NOT DETECTED Final   Staphylococcus aureus NOT DETECTED NOT DETECTED Final   Streptococcus species NOT DETECTED NOT DETECTED Final   Streptococcus agalactiae NOT DETECTED NOT DETECTED Final   Streptococcus pneumoniae NOT DETECTED NOT DETECTED Final   Streptococcus pyogenes NOT DETECTED NOT DETECTED Final   Acinetobacter baumannii NOT DETECTED NOT DETECTED Final   Enterobacteriaceae species DETECTED (A) NOT DETECTED Final    Comment: CRITICAL RESULT CALLED TO, READ BACK BY AND VERIFIED WITH: R. CLARK, PHARM AT 0710 ON [id number] BY S. YARBROUGH    Enterobacter cloacae complex NOT DETECTED NOT DETECTED Final   Escherichia coli NOT DETECTED NOT DETECTED Final   Klebsiella oxytoca DETECTED (A) NOT DETECTED Final    Comment: CRITICAL RESULT CALLED TO, READ BACK BY AND VERIFIED WITH: R. CLARK, PHARM AT 0710 ON [id number] BY S. YARBROUGH    Klebsiella pneumoniae NOT DETECTED NOT DETECTED Final   Proteus species NOT DETECTED NOT DETECTED Final   Serratia marcescens NOT DETECTED NOT DETECTED Final   Carbapenem resistance NOT DETECTED NOT DETECTED Final   Haemophilus influenzae NOT DETECTED NOT  DETECTED Final   Neisseria meningitidis NOT DETECTED NOT DETECTED Final   Pseudomonas aeruginosa NOT DETECTED NOT DETECTED Final   Candida albicans NOT DETECTED NOT DETECTED Final   Candida glabrata NOT DETECTED NOT DETECTED Final   Candida krusei NOT DETECTED NOT DETECTED Final   Candida parapsilosis NOT DETECTED NOT DETECTED Final   Candida tropicalis NOT DETECTED NOT DETECTED Final  Urine culture     Status: Abnormal   Collection Time: 12/01/16  2:28 PM  Result Value Ref Range Status   Specimen Description URINE, RANDOM  Final   Special Requests NONE  Final   Culture >=100,000 COLONIES/mL KLEBSIELLA OXYTOCA (A)  Final   Report Status 12/03/2016 FINAL  Final   Organism ID, Bacteria KLEBSIELLA OXYTOCA (A)  Final      Susceptibility   Klebsiella oxytoca - MIC*    AMPICILLIN >=32 RESISTANT Resistant     CEFAZOLIN >=64 RESISTANT Resistant     CEFTRIAXONE <=1 SENSITIVE Sensitive     CIPROFLOXACIN <=0.25 SENSITIVE Sensitive     GENTAMICIN <=1 SENSITIVE Sensitive     IMIPENEM <=0.25 SENSITIVE Sensitive     NITROFURANTOIN 32 SENSITIVE Sensitive     TRIMETH/SULFA <=20 SENSITIVE Sensitive     AMPICILLIN/SULBACTAM 16 INTERMEDIATE Intermediate     PIP/TAZO <=4 SENSITIVE Sensitive     Extended ESBL NEGATIVE Sensitive     * >=100,000 COLONIES/mL KLEBSIELLA OXYTOCA  Culture, blood (x 2)     Status: Abnormal   Collection Time: 12/01/16  6:00 PM  Result Value Ref Range Status   Specimen Description BLOOD RIGHT ANTECUBITAL  Final   Special Requests   Final    BOTTLES DRAWN AEROBIC AND ANAEROBIC 10CC AER 5CC ANA   Culture  Setup Time   Final    GRAM NEGATIVE RODS ANAEROBIC BOTTLE ONLY CRITICAL RESULT CALLED TO, READ BACK BY AND VERIFIED WITH: V BRYK,PHARMD @0716  12/05/16 MKELLY,MLT    Culture (A)  Final    KLEBSIELLA OXYTOCA SUSCEPTIBILITIES PERFORMED ON PREVIOUS  CULTURE WITHIN THE LAST 5 DAYS.    Report Status 12/07/2016 FINAL  Final  Culture, blood (x 2)     Status: None   Collection  Time: 12/01/16  6:11 PM  Result Value Ref Range Status   Specimen Description BLOOD LEFT HAND  Final   Special Requests BOTTLES DRAWN AEROBIC ONLY 10CC  Final   Culture NO GROWTH 5 DAYS  Final   Report Status 12/06/2016 FINAL  Final  MRSA PCR Screening     Status: None   Collection Time: 12/02/16  6:07 AM  Result Value Ref Range Status   MRSA by PCR NEGATIVE NEGATIVE Final    Comment:        The GeneXpert MRSA Assay (FDA approved for NASAL specimens only), is one component of a comprehensive MRSA colonization surveillance program. It is not intended to diagnose MRSA infection nor to guide or monitor treatment for MRSA infections.   Culture, blood (routine x 2)     Status: None (Preliminary result)   Collection Time: 12/05/16  3:04 PM  Result Value Ref Range Status   Specimen Description BLOOD LEFT HAND  Final   Special Requests IN PEDIATRIC BOTTLE 2CC  Final   Culture NO GROWTH < 24 HOURS  Final   Report Status PENDING  Incomplete  Culture, blood (routine x 2)     Status: None (Preliminary result)   Collection Time: 12/05/16  3:10 PM  Result Value Ref Range Status   Specimen Description BLOOD LEFT ANTECUBITAL  Final   Special Requests IN PEDIATRIC BOTTLE 2CC  Final   Culture NO GROWTH < 24 HOURS  Final   Report Status PENDING  Incomplete     Scheduled Meds: . apixaban  5 mg Oral Q12H  . cefpodoxime  400 mg Oral Q12H  . feeding supplement (ENSURE ENLIVE)  237 mL Oral Q breakfast  . metoprolol  100 mg Oral BID  . midodrine  5 mg Oral TID WC  . mometasone-formoterol  2 puff Inhalation BID  . niacin  500 mg Oral QHS  . polyethylene glycol  17 g Oral Daily  . saccharomyces boulardii  250 mg Oral BID  . sodium chloride flush  3 mL Intravenous Q12H  . sodium phosphate  1 enema Rectal Once  . tamsulosin  0.4 mg Oral QHS  . tiotropium  18 mcg Inhalation Daily   Continuous Infusions:    LOS: 6 days   Niel Hummer MD [facility] Office  [phone] Pager -  Text Page per Shea Evans as per below:  On-Call/Text Page:      [email or web address]      password [email or web address]  If 7PM-7AM, please contact night-coverage [email or web address] Password [email or web address] 12/07/2016, 1:55 PM

## 2016-12-08 DIAGNOSIS — N179 Acute kidney failure, unspecified: Secondary | ICD-10-CM

## 2016-12-08 DIAGNOSIS — J9601 Acute respiratory failure with hypoxia: Secondary | ICD-10-CM

## 2016-12-08 LAB — CBC
HCT: 31.9 % — ABNORMAL LOW (ref 39.0–52.0)
HEMOGLOBIN: 10.2 g/dL — AB (ref 13.0–17.0)
MCH: 31.3 pg (ref 26.0–34.0)
MCHC: 32 g/dL (ref 30.0–36.0)
MCV: 97.9 fL (ref 78.0–100.0)
PLATELETS: 131 10*3/uL — AB (ref 150–400)
RBC: 3.26 MIL/uL — ABNORMAL LOW (ref 4.22–5.81)
RDW: 14.9 % (ref 11.5–15.5)
WBC: 5.2 10*3/uL (ref 4.0–10.5)

## 2016-12-08 LAB — TYPE AND SCREEN
ABO/RH(D): A POS
Antibody Screen: NEGATIVE
Unit division: 0

## 2016-12-08 NOTE — Progress Notes (Signed)
Pt. Had a good bowel movement yesterday and today, pt also walked 532ft.  On room air with a walker. Pt tolerated the walk well and his o2 saturation stayed above 95%

## 2016-12-08 NOTE — Progress Notes (Addendum)
Elham Wyles Mantell  [id number] DOB: 12-Jun-1929 DOA: 12/01/2016 PCP: Leonides Sake, MD    Brief Narrative:  82 year old with COPD, anemia, MGUS, and CAD who presented w/ a fever of 105, dysuria, and difficulty urinating for 3 days. In the ED Pulse was 130, Temp 104.2 F, and UA was positive.    Subjective: He is doing well, denies abdominal pain, dyspnea.     Assessment & Plan:  Sepsis due to Klebsiella UTI w/ bacteremia  Sepsis physiology has resolved - continue directed antibiotic therapy On  ceftriaxone. Repeated blood culture negative  Change antibiotics to oral. Needs 2 weeks treatment.   Leukopenia in the setting of sepsis WBC 0.8 with ANC 0.7 - Heme following - due to gram neg bacteremia - now corrected Resolved.   Transaminitis ? Mild shock liver - improving w/ volume resuscitation  RUQ [place] gallstone. No further evaluation.  Outpatient follow up.    CAD; supposed to get ECHO this month. This was ordered. Echo with normal ef.   MGUS followed by Dr. Marin Olp - noncontributory   COPD  Well compensated  Acute kidney injury on Chronic kidney disease baseline creatinine 1.2-1.4 - crt has improved with volume resuscitation -  Recent Labs Lab 12/02/16 0212 12/03/16 0325 12/05/16 0259 12/06/16 0330 12/07/16 0324  CREATININE 1.87* 1.36* 1.12 1.07 1.00  improved.  Ascites  on [place] will give one time dose of lasix.   CAD s/p CABG 2006 Asymptomatic at this time   Parox SVT - recurrent A flutter  S/p [facility] on chronic eliquis - remains in sinus tachy at present - increase beta blocker and follow  Anemia of chronic disease and iron deficiency Care per Heme - received one unit PRBC. Hb increase to 10   Hyperlipidemia Continue home statins  Orthostatic Hypotension  On chronic midodrine   DVT prophylaxis: eliquis  Code Status: FULL CODE Family Communication: daughter 12-09 Disposition Plan: home 12-11, dispo; out of bed, ambulate patient, titrate oxygen  off.   Consultants:  Heme/Onc  Procedures: none  Antimicrobials:  Zosyn 12/3  Vanc 12/3 Rocephin 12/4 >  Objective: Blood pressure 131/88, pulse (!) 115, temperature 98.2 F (36.8 C), temperature source Oral, resp. rate 20, height 5\' 10"  (1.778 m), weight 84.3 kg (185 lb 13.6 oz), SpO2 98 %.  Intake/Output Summary (Last 24 hours) at 12/08/16 1202 Last data filed at 12/08/16 0518  Gross per 24 hour  Intake             1029 ml  Output             1001 ml  Net               28 ml   Filed Weights   12/01/16 1418 12/02/16 0613  Weight: 87.1 kg (192 lb) 84.3 kg (185 lb 13.6 oz)    Examination: General: No acute respiratory distress - alert Lungs: Clear to auscultation bilaterally  Cardiovascular: Regular but tachycardiac at 110bpm - no M or gallop Abdomen: Nontender, nondistended, soft, bowel sounds positive, no rebound Extremities: No significant edema bilateral lower extremities  CBC:  Recent Labs Lab 12/01/16 1414 12/01/16 1749  12/03/16 0325 12/05/16 0259 12/06/16 0330 12/07/16 0324 12/08/16 0744  WBC 0.8* 5.1  < > 19.9* 9.5 6.6 5.1 5.2  NEUTROABS 0.7* 5.0  --   --   --   --   --   --   HGB 9.8* 9.6*  < > 9.1* 9.2* 8.8* 8.5* 10.2*  HCT 30.9* 29.8*  < >  28.5* 28.8* 28.1* 27.0* 31.9*  MCV 97.8 99.3  < > 99.3 100.3* 101.4* 101.5* 97.9  PLT 115* 97*  < > 98* 114* 106* 115* 131*  < > = values in this interval not displayed. Basic Metabolic Panel:  Recent Labs Lab 12/02/16 0212 12/03/16 0325 12/05/16 0259 12/06/16 0330 12/07/16 0324  NA 137 138 140 138 139  K 4.6 4.3 4.5 4.4 4.1  CL 106 105 107 103 103  CO2 20* 23 27 25 29   GLUCOSE 121* 127* 111* 108* 97  BUN 35* 36* 21* 21* 20  CREATININE 1.87* 1.36* 1.12 1.07 1.00  CALCIUM 8.1* 8.8* 9.0 9.2 8.9   GFR: Estimated Creatinine Clearance: 55.8 mL/min (by C-G formula based on SCr of 1 mg/dL).  Liver Function Tests:  Recent Labs Lab 12/01/16 1749 12/02/16 0212 12/03/16 0325 12/05/16 0259  12/06/16 0330  AST 368* 336* 171* 103* 60*  ALT 310* 326* 243* 171* 129*  ALKPHOS 207* 190* 215* 198* 173*  BILITOT 1.9* 2.2* 1.3* 0.7 1.1  PROT 5.9* 6.1* 6.3* 6.0* 5.9*  ALBUMIN 2.6* 2.9* 2.7* 2.6* 2.6*    Coagulation Profile:  Recent Labs Lab 12/01/16 1749 12/02/16 0212  INR 1.86 1.90    HbA1C: Hgb A1c MFr Bld  Date/Time Value Ref Range Status  07/17/2009 01:00 PM  4.6 - 6.1 % Final   5.9 (NOTE) The ADA recommends the following therapeutic goal for glycemic control related to Hgb A1c measurement: Goal of therapy: <6.5 Hgb A1c  Reference: American Diabetes Association: Clinical Practice Recommendations 2010, Diabetes Care, 2010, 33: (Suppl  1).    CBG:  Recent Labs Lab 12/02/16 0608 12/04/16 2053  GLUCAP 104* 126*    Recent Results (from the past 240 hour(s))  Blood Culture (routine x 2)     Status: Abnormal   Collection Time: 12/01/16  2:14 PM  Result Value Ref Range Status   Specimen Description BLOOD RIGHT ANTECUBITAL  Final   Special Requests BOTTLES DRAWN AEROBIC AND ANAEROBIC 5CC  Final   Culture  Setup Time   Final    IN BOTH AEROBIC AND ANAEROBIC BOTTLES GRAM NEGATIVE RODS CRITICAL RESULT CALLED TO, READ BACK BY AND VERIFIED WITH: RTollie Eth AT 0710 ON [id number] BY S. YARBROUGH    Culture KLEBSIELLA OXYTOCA (A)  Final   Report Status 12/04/2016 FINAL  Final   Organism ID, Bacteria KLEBSIELLA OXYTOCA  Final      Susceptibility   Klebsiella oxytoca - MIC*    AMPICILLIN >=32 RESISTANT Resistant     CEFAZOLIN >=64 RESISTANT Resistant     CEFEPIME <=1 SENSITIVE Sensitive     CEFTAZIDIME <=1 SENSITIVE Sensitive     CEFTRIAXONE <=1 SENSITIVE Sensitive     CIPROFLOXACIN <=0.25 SENSITIVE Sensitive     GENTAMICIN <=1 SENSITIVE Sensitive     IMIPENEM <=0.25 SENSITIVE Sensitive     TRIMETH/SULFA <=20 SENSITIVE Sensitive     AMPICILLIN/SULBACTAM 16 INTERMEDIATE Intermediate     PIP/TAZO <=4 SENSITIVE Sensitive     Extended ESBL NEGATIVE Sensitive     *  KLEBSIELLA OXYTOCA  Blood Culture (routine x 2)     Status: Abnormal   Collection Time: 12/01/16  2:14 PM  Result Value Ref Range Status   Specimen Description BLOOD RIGHT ANTECUBITAL  Final   Special Requests BOTTLES DRAWN AEROBIC AND ANAEROBIC 5CC  Final   Culture  Setup Time   Final    IN BOTH AEROBIC AND ANAEROBIC BOTTLES GRAM NEGATIVE RODS CRITICAL RESULT CALLED TO, READ BACK  BY AND VERIFIED WITH: R. CLARK, PHARM AT 0710 ON [id number] BY S. YARBROOUGH    Culture (A)  Final    KLEBSIELLA OXYTOCA SUSCEPTIBILITIES PERFORMED ON PREVIOUS CULTURE WITHIN THE LAST 5 DAYS.    Report Status 12/04/2016 FINAL  Final  Blood Culture ID Panel (Reflexed)     Status: Abnormal   Collection Time: 12/01/16  2:14 PM  Result Value Ref Range Status   Enterococcus species NOT DETECTED NOT DETECTED Final   Listeria monocytogenes NOT DETECTED NOT DETECTED Final   Staphylococcus species NOT DETECTED NOT DETECTED Final   Staphylococcus aureus NOT DETECTED NOT DETECTED Final   Streptococcus species NOT DETECTED NOT DETECTED Final   Streptococcus agalactiae NOT DETECTED NOT DETECTED Final   Streptococcus pneumoniae NOT DETECTED NOT DETECTED Final   Streptococcus pyogenes NOT DETECTED NOT DETECTED Final   Acinetobacter baumannii NOT DETECTED NOT DETECTED Final   Enterobacteriaceae species DETECTED (A) NOT DETECTED Final    Comment: CRITICAL RESULT CALLED TO, READ BACK BY AND VERIFIED WITH: R. CLARK, PHARM AT 0710 ON [id number] BY S. YARBROUGH    Enterobacter cloacae complex NOT DETECTED NOT DETECTED Final   Escherichia coli NOT DETECTED NOT DETECTED Final   Klebsiella oxytoca DETECTED (A) NOT DETECTED Final    Comment: CRITICAL RESULT CALLED TO, READ BACK BY AND VERIFIED WITH: R. CLARK, PHARM AT 0710 ON [id number] BY S. YARBROUGH    Klebsiella pneumoniae NOT DETECTED NOT DETECTED Final   Proteus species NOT DETECTED NOT DETECTED Final   Serratia marcescens NOT DETECTED NOT DETECTED Final   Carbapenem resistance  NOT DETECTED NOT DETECTED Final   Haemophilus influenzae NOT DETECTED NOT DETECTED Final   Neisseria meningitidis NOT DETECTED NOT DETECTED Final   Pseudomonas aeruginosa NOT DETECTED NOT DETECTED Final   Candida albicans NOT DETECTED NOT DETECTED Final   Candida glabrata NOT DETECTED NOT DETECTED Final   Candida krusei NOT DETECTED NOT DETECTED Final   Candida parapsilosis NOT DETECTED NOT DETECTED Final   Candida tropicalis NOT DETECTED NOT DETECTED Final  Urine culture     Status: Abnormal   Collection Time: 12/01/16  2:28 PM  Result Value Ref Range Status   Specimen Description URINE, RANDOM  Final   Special Requests NONE  Final   Culture >=100,000 COLONIES/mL KLEBSIELLA OXYTOCA (A)  Final   Report Status 12/03/2016 FINAL  Final   Organism ID, Bacteria KLEBSIELLA OXYTOCA (A)  Final      Susceptibility   Klebsiella oxytoca - MIC*    AMPICILLIN >=32 RESISTANT Resistant     CEFAZOLIN >=64 RESISTANT Resistant     CEFTRIAXONE <=1 SENSITIVE Sensitive     CIPROFLOXACIN <=0.25 SENSITIVE Sensitive     GENTAMICIN <=1 SENSITIVE Sensitive     IMIPENEM <=0.25 SENSITIVE Sensitive     NITROFURANTOIN 32 SENSITIVE Sensitive     TRIMETH/SULFA <=20 SENSITIVE Sensitive     AMPICILLIN/SULBACTAM 16 INTERMEDIATE Intermediate     PIP/TAZO <=4 SENSITIVE Sensitive     Extended ESBL NEGATIVE Sensitive     * >=100,000 COLONIES/mL KLEBSIELLA OXYTOCA  Culture, blood (x 2)     Status: Abnormal   Collection Time: 12/01/16  6:00 PM  Result Value Ref Range Status   Specimen Description BLOOD RIGHT ANTECUBITAL  Final   Special Requests   Final    BOTTLES DRAWN AEROBIC AND ANAEROBIC 10CC AER 5CC ANA   Culture  Setup Time   Final    GRAM NEGATIVE RODS ANAEROBIC BOTTLE ONLY CRITICAL RESULT CALLED TO, READ BACK BY  AND VERIFIED WITH: V BRYK,PHARMD @0716  12/05/16 MKELLY,MLT    Culture (A)  Final    KLEBSIELLA OXYTOCA SUSCEPTIBILITIES PERFORMED ON PREVIOUS CULTURE WITHIN THE LAST 5 DAYS.    Report Status  12/07/2016 FINAL  Final  Culture, blood (x 2)     Status: None   Collection Time: 12/01/16  6:11 PM  Result Value Ref Range Status   Specimen Description BLOOD LEFT HAND  Final   Special Requests BOTTLES DRAWN AEROBIC ONLY 10CC  Final   Culture NO GROWTH 5 DAYS  Final   Report Status 12/06/2016 FINAL  Final  MRSA PCR Screening     Status: None   Collection Time: 12/02/16  6:07 AM  Result Value Ref Range Status   MRSA by PCR NEGATIVE NEGATIVE Final    Comment:        The GeneXpert MRSA Assay (FDA approved for NASAL specimens only), is one component of a comprehensive MRSA colonization surveillance program. It is not intended to diagnose MRSA infection nor to guide or monitor treatment for MRSA infections.   Culture, blood (routine x 2)     Status: None (Preliminary result)   Collection Time: 12/05/16  3:04 PM  Result Value Ref Range Status   Specimen Description BLOOD LEFT HAND  Final   Special Requests IN PEDIATRIC BOTTLE 2CC  Final   Culture NO GROWTH 2 DAYS  Final   Report Status PENDING  Incomplete  Culture, blood (routine x 2)     Status: None (Preliminary result)   Collection Time: 12/05/16  3:10 PM  Result Value Ref Range Status   Specimen Description BLOOD LEFT ANTECUBITAL  Final   Special Requests IN PEDIATRIC BOTTLE 2CC  Final   Culture NO GROWTH 2 DAYS  Final   Report Status PENDING  Incomplete     Scheduled Meds: . sodium chloride   Intravenous Once  . apixaban  5 mg Oral Q12H  . cefpodoxime  400 mg Oral Q12H  . feeding supplement (ENSURE ENLIVE)  237 mL Oral Q breakfast  . metoprolol  100 mg Oral BID  . midodrine  5 mg Oral TID WC  . mometasone-formoterol  2 puff Inhalation BID  . niacin  500 mg Oral QHS  . polyethylene glycol  17 g Oral Daily  . saccharomyces boulardii  250 mg Oral BID  . sodium chloride flush  3 mL Intravenous Q12H  . sodium phosphate  1 enema Rectal Once  . tamsulosin  0.4 mg Oral QHS  . tiotropium  18 mcg Inhalation Daily    Continuous Infusions:    LOS: 7 days   Niel Hummer MD [facility] Office  [phone] Pager - Text Page per Shea Evans as per below:  On-Call/Text Page:      [email or web address]      password [email or web address]  If 7PM-7AM, please contact night-coverage [email or web address] Password [email or web address] 12/08/2016, 12:02 PM

## 2016-12-09 ENCOUNTER — Encounter: Payer: Self-pay | Admitting: Gastroenterology

## 2016-12-09 ENCOUNTER — Telehealth: Payer: Self-pay | Admitting: Emergency Medicine

## 2016-12-09 LAB — CBC
HCT: 31.4 % — ABNORMAL LOW (ref 39.0–52.0)
Hemoglobin: 9.8 g/dL — ABNORMAL LOW (ref 13.0–17.0)
MCH: 31.3 pg (ref 26.0–34.0)
MCHC: 31.2 g/dL (ref 30.0–36.0)
MCV: 100.3 fL — ABNORMAL HIGH (ref 78.0–100.0)
PLATELETS: 134 10*3/uL — AB (ref 150–400)
RBC: 3.13 MIL/uL — AB (ref 4.22–5.81)
RDW: 15.1 % (ref 11.5–15.5)
WBC: 5.2 10*3/uL (ref 4.0–10.5)

## 2016-12-09 LAB — BASIC METABOLIC PANEL
Anion gap: 10 (ref 5–15)
BUN: 13 mg/dL (ref 6–20)
CALCIUM: 8.6 mg/dL — AB (ref 8.9–10.3)
CO2: 26 mmol/L (ref 22–32)
CREATININE: 1.1 mg/dL (ref 0.61–1.24)
Chloride: 103 mmol/L (ref 101–111)
GFR calc Af Amer: >60 mL/min (ref >60)
GFR, EST NON AFRICAN AMERICAN: 59 mL/min — AB (ref >60)
GLUCOSE: 105 mg/dL — AB (ref 65–99)
POTASSIUM: 4.1 mmol/L (ref 3.5–5.1)
SODIUM: 139 mmol/L (ref 135–145)

## 2016-12-09 MED ORDER — METOPROLOL TARTRATE 100 MG PO TABS: 100.0000 mg | ORAL_TABLET | Freq: Two times a day (BID) | ORAL | 0 refills | Status: DC

## 2016-12-09 MED ORDER — ALBUTEROL SULFATE HFA 108 (90 BASE) MCG/ACT IN AERS: 2.0000 | INHALATION_SPRAY | RESPIRATORY_TRACT | 0 refills | Status: DC | PRN

## 2016-12-09 MED ORDER — BUDESONIDE-FORMOTEROL FUMARATE 80-4.5 MCG/ACT IN AERO: INHALATION_SPRAY | RESPIRATORY_TRACT | 0 refills | Status: DC

## 2016-12-09 MED ORDER — TIOTROPIUM BROMIDE MONOHYDRATE 18 MCG IN CAPS: ORAL_CAPSULE | RESPIRATORY_TRACT | 0 refills | Status: DC

## 2016-12-09 MED ORDER — CEFPODOXIME PROXETIL 200 MG PO TABS: 400.0000 mg | ORAL_TABLET | Freq: Two times a day (BID) | ORAL | 0 refills | Status: DC

## 2016-12-09 NOTE — Care Management Important Message (Signed)
Important Message  Patient Details  Name: [name] MRN: [id number] Date of Birth: Dec 08, 1929   Medicare Important Message Given:  Yes    [name] Abena 12/09/2016, 9:57 AM

## 2016-12-09 NOTE — Progress Notes (Signed)
Physical Therapy Treatment Patient Details Name: [name] MRN: [id number] DOB: 1929-06-25 Today's Date: 12/09/2016    History of Present Illness 82 yo admitted with UTI and sepsis. PMhx: CAD, CABG, pacemaker, anemia, COPD, emphysema    PT Comments    Initiated stair training today with 2 rails and min/guard.  Pt reports daughter will be with him at time of d/c.  HR 113-120 with gait and stairs. Con't to recommend HHPT and S for safety.  Follow Up Recommendations  Home health PT;Supervision/Assistance - 24 hour     Equipment Recommendations  3in1 (PT)    Recommendations for Other Services       Precautions / Restrictions Precautions Precautions: Fall Precaution Comments: watch HR Restrictions Weight Bearing Restrictions: No    Mobility  Bed Mobility Overal bed mobility: Needs Assistance       Supine to sit: Modified independent (Device/Increase time)     General bed mobility comments: bed flat with use of rail  Transfers Overall transfer level: Needs assistance Equipment used: Rolling walker (2 wheeled) Transfers: Sit to/from Stand Sit to Stand: Supervision         General transfer comment: steady upon standing  Ambulation/Gait Ambulation/Gait assistance: Min guard Ambulation Distance (Feet): 240 Feet Assistive device: Rolling walker (2 wheeled) Gait Pattern/deviations: Trunk flexed     General Gait Details: HR 113-120 with gait and stairs.  Pt seemed to get steadier as gait progressed.   Stairs Stairs: Yes   Stair Management: Two rails;Step to pattern;Forwards;Backwards Number of Stairs: 3 General stair comments: Pt ascended stairs forwards with step to pattern and backwards with step to pattern.  No cues needed for technique.  Wheelchair Mobility    Modified Rankin (Stroke Patients Only)       Balance           Standing balance support: Single extremity supported Standing balance-Leahy Scale: Fair                       Cognition Arousal/Alertness: Awake/alert Behavior During Therapy: WFL for tasks assessed/performed Overall Cognitive Status: Within Functional Limits for tasks assessed                      Exercises      General Comments        Pertinent Vitals/Pain Pain Assessment: No/denies pain    Home Living Family/patient expects to be discharged to:: Unsure Living Arrangements: Children                  Prior Function            PT Goals (current goals can now be found in the care plan section) Acute Rehab PT Goals Patient Stated Goal: return home PT Goal Formulation: With patient Time For Goal Achievement: 12/18/16 Potential to Achieve Goals: Good Progress towards PT goals: Progressing toward goals    Frequency    Min 3X/week      PT Plan Current plan remains appropriate    Co-evaluation             End of Session Equipment Utilized During Treatment: Gait belt Activity Tolerance: Patient tolerated treatment well Patient left: in chair;with call bell/phone within reach     Time: 1051-1110 PT Time Calculation (min) (ACUTE ONLY): 19 min  Charges:  $Gait Training: 8-22 mins                    G Codes:  [name] 12/09/2016, 11:21 AM

## 2016-12-09 NOTE — Progress Notes (Signed)
Occupational Therapy Treatment and Discharge Patient Details Name: [name] MRN: [id number] DOB: 01-23-1929 Today's Date: 12/09/2016    History of present illness 82 yo admitted with UTI and sepsis. PMhx: CAD, CABG, pacemaker, anemia, COPD, emphysema   OT comments  This 82 yo male admitted with above presents to acute OT today making progress towards dressing and toileting goals. Pt to D/C home today with recommended follow up [facility] (I have let CM know and she is texting MD to see if OT can be added to [facility] services). We will D/C from acute OT.  Follow Up Recommendations  Home health OT;Supervision/Assistance - 24 hour    Equipment Recommendations  None recommended by OT (pt has a rail on his tub he uses and the sink to get up and down from toilet at home)       Precautions / Restrictions Precautions Precautions: Fall Precaution Comments: watch HR Restrictions Weight Bearing Restrictions: No       Mobility Bed Mobility Overal bed mobility: Needs Assistance       Supine to sit: Modified independent (Device/Increase time)     General bed mobility comments: Pt up in recliner upon arrival  Transfers Overall transfer level: Needs assistance Equipment used: None Transfers: Sit to/from Stand Sit to Stand: Supervision         General transfer comment: min guard A ambulation around room without AD    Balance           Standing balance support: Single extremity supported Standing balance-Leahy Scale: Fair                     ADL Overall ADL's : Needs assistance/impaired                         Toilet Transfer: Min guard;Ambulation;Regular Toilet;Grab bars   Toileting- Clothing Manipulation and Hygiene: Min guard;Sit to/from stand         General ADL Comments: Pt min guard A for ambulation to from standard toilet in bathroom. Set up/S UB/LBD; S standing at sink to wash hands. Pt with DOE 3/4 at times (educated on purse lipped  breathing--dtr reports that she tries to get him to do this at home and he says he is doing it, but he isn't)                Cognition   Behavior During Therapy: WFL for tasks assessed/performed Overall Cognitive Status: Within Functional Limits for tasks assessed                                    Pertinent Vitals/ Pain       Pain Assessment: No/denies pain         Frequency  Min 2X/week        Progress Toward Goals  OT Goals(current goals can now be found in the care plan section)  Progress towards OT goals: Progressing toward goals  Acute Rehab OT Goals Patient Stated Goal: return home  Plan Discharge plan remains appropriate          Activity Tolerance  (DOE 3/4 at times; pt rested as he needed to without cues)   Patient Left in chair;with call bell/phone within reach;with family/visitor present;with nursing/sitter in room   Nurse Communication  (pt dressed and ready to go)        Time: [id number] OT Time  Calculation (min): 33 min  Charges: OT General Charges $OT Visit: 1 Procedure OT Treatments $Self Care/Home Management : 23-37 mins  Almon Register [id number] 12/09/2016, 1:13 PM

## 2016-12-09 NOTE — Telephone Encounter (Signed)
Called and spoke to pt's daughter, [name]. [name] is requesting a 90 day supply of albuterol, spiriva, and symbicort - she states this is the only month pt is able to get 3 month supply for 1 months pay. Pt has f/u with [facility] on 12/2016. Rx's sent to preferred pharmacy. Joann verbalized understanding and denied any further questions or concerns at this time.

## 2016-12-09 NOTE — Care Management Note (Signed)
Case Management Note Previous CM note initiated by Zenon Mayo, RN-12/04/2016, 1:06 PM    Patient Details  Name: [name] MRN: [id number] Date of Birth: 17-Mar-1929  Subjective/Objective:  Presents with sepsis, uti, leukopenia, MGUS , aki, he lives with daughter [name], who takes care of him.  NCM left her a vm to call back regarding HHPT for patient at home.  Patient states he would like for her to chose the agency for him.  He lives in [place].  NCM awaiting call back from daughter.  NCM also spoke with [name] the [facility], and she states to call [name] because she is with him everyday.   1430 12/6 Tomi Bamberger RN, BSN - NCM received call from daughter [name], she states they have had [place] [facility] before and would like to work with them again.  Also she states she thinks patient has a rolling walker that his wife had and she will check in the garage and call me back to let me know.   NCM made referral with [facility] with [name] for HHPT.  [facility] Kidney states they will be able to take referral.  Soc will begin 24-48 hrs post dc.                   Action/Plan: Pt tx from 3S to 2W  Expected Discharge Date:    12/09/16              Expected Discharge Plan:  [place]  In-House Referral:     Discharge planning Services  CM Consult  Post Acute Care Choice:  Home Health, Durable Medical Equipment Choice offered to:  Adult Children  DME Arranged:    DME Agency:     HH Arranged:  PT, RNHHPT [place] Agency:  [place] [facility]  Status of Service:  Completed, signed off  If discussed at [place] of Stay Meetings, dates discussed:    Discharge Disposition: home with home health   Additional Comments:  12/09/16- 1100- Donta Fuster RN, CM- pt for d/c home today with [facility] services- spoke with pt at bedside- pt requested CM to f/u with daughter [name]- TC made and spoke with [name] to confirm DME needs and [facility] agency  choice- JoAnn confirmed [facility] as [place] agency of choice- she also states that pt already has walker at home, along with cane and shower chair- daughter does not feel like pt needs 3n1 and declines that DME for discharge- call made to [place] with [facility]- referral has been made and they will f/u with pt on discharge.  Dahlia Client [place], RN 12/09/2016, 11:04 AM [phone]

## 2016-12-09 NOTE — Discharge Summary (Signed)
Physician Discharge Summary  [name] [id number] DOB: Sep 07, 1929 DOA: 12/01/2016  PCP: Leonides Sake, MD  Admit date: 12/01/2016 Discharge date: 12/09/2016  Admitted From:  Home  Disposition:  H  Recommendations for Outpatient Follow-up:  1. Follow up with PCP in 1-2 weeks 2. Please obtain BMP/CBC in one week 3. Needs repeat LFT in 1 week and 2 months.   Home Health: Yes   Discharge Condition: Stable.  CODE STATUS: Full code.  Diet recommendation: Heart Healthy  Brief/Interim Summary: 82 year old with COPD, anemia, MGUS, and CAD who presented w/ a fever of 105, dysuria, and difficulty urinating for 3 days. In the ED Pulse was 130, Temp 104.2 F, and UA was positive.    Subjective: He is doing well, denies abdominal pain, dyspnea.     Assessment & Plan:  Sepsis due to Klebsiella UTI w/ bacteremia  Sepsis physiology has resolved - continue directed antibiotic therapy On  ceftriaxone. Repeated blood culture negative  Change antibiotics to oral. Needs 2 weeks treatment. Will provide 10 days prescription for cefpodoxime   Leukopenia in the setting of sepsis WBC 0.8 with ANC 0.7 - Heme following - due to gram neg bacteremia - now corrected Resolved.   Transaminitis Mild shock liver - improving w/ volume resuscitation  RUQ [place] gallstone. No further evaluation.  Outpatient follow up.  will repeat prior to discharge   CAD; supposed to get ECHO this month. This was ordered. Echo with normal ef.   MGUS followed by Dr. Marin Olp - noncontributory   COPD  Well compensated  Acute kidney injury on Chronic kidney disease baseline creatinine 1.2-1.4 - crt has improved with volume resuscitation -  Last Labs    Recent Labs Lab 12/02/16 0212 12/03/16 0325 12/05/16 0259 12/06/16 0330 12/07/16 0324  CREATININE 1.87* 1.36* 1.12 1.07 1.00    improved.    CAD s/p CABG 2006 Asymptomatic at this time   Parox SVT - recurrent A flutter  S/p [facility]  on chronic eliquis - remains in sinus tachy at present - increase beta blocker and follow  Anemia of chronic disease and iron deficiency Care per Heme - received one unit PRBC. Hb increase to 10--9.6   Hyperlipidemia Continue home statins  Orthostatic Hypotension  On chronic midodrine     Consultants:  Heme/Onc    Discharge Diagnoses:  Active Problems:   MGUS (monoclonal gammopathy of unknown significance)   Chronic anticoagulation   UTI (urinary tract infection)   Lactic acidosis   Acute respiratory failure with hypoxia (HCC)   Acute-on-chronic kidney injury ([place])   Sepsis (HCC)   Sepsis secondary to UTI ([place])   Bacteremia due to Enterobacter species   Monoclonal gammopathy of unknown significance (MGUS)   Panlobular emphysema (HCC)   Acute renal failure with acute tubular necrosis superimposed on chronic kidney disease ([place])   Other hyperlipidemia    Discharge Instructions  Discharge Instructions    Diet - low sodium heart healthy    Complete by:  As directed    Increase activity slowly    Complete by:  As directed        Medication List    STOP taking these medications   atorvastatin 40 MG tablet Commonly known as:  LIPITOR     TAKE these medications   acetaminophen 500 MG tablet Commonly known as:  TYLENOL Take 1,000 mg by mouth every 6 (six) hours as needed.   albuterol 108 (90 Base) MCG/ACT inhaler Commonly known as:  PROVENTIL HFA;VENTOLIN HFA Inhale 2  puffs into the lungs every 4 (four) hours as needed for wheezing or shortness of breath.   apixaban 5 MG Tabs tablet Commonly known as:  ELIQUIS Take 1 tablet (5 mg total) by mouth every 12 (twelve) hours.   cefpodoxime 200 MG tablet Commonly known as:  VANTIN Take 2 tablets (400 mg total) by mouth every 12 (twelve) hours.   [employer] SENIOR PROBIOTIC PO Take 1 capsule by mouth at bedtime. Once daily   diphenhydramine-acetaminophen 25-500 MG Tabs tablet Commonly known as:  TYLENOL  PM Take 2 tablets by mouth at bedtime.   ENSURE Take 1 Can by mouth every morning.   metoprolol 100 MG tablet Commonly known as:  LOPRESSOR Take 1 tablet (100 mg total) by mouth 2 (two) times daily. What changed:  medication strength  how much to take   midodrine 5 MG tablet Commonly known as:  PROAMATINE TAKE 1 TABLET (5 MG TOTAL) BY MOUTH 3 (THREE) TIMES DAILY.   niacin 500 MG CR tablet Commonly known as:  NIASPAN TAKE 1 TABLET (500 MG TOTAL) BY MOUTH AT BEDTIME.   SYMBICORT 80-4.5 MCG/ACT inhaler Generic drug:  budesonide-formoterol INHALE 2 PUFFS INTO THE LUNGS 2 (TWO) TIMES DAILY.   tamsulosin 0.4 MG Caps capsule Commonly known as:  FLOMAX Take 0.4 mg by mouth at bedtime.   tiotropium 18 MCG inhalation capsule Commonly known as:  SPIRIVA HANDIHALER PLACE 1 CAPSULE (18 MCG TOTAL) INTO INHALER AND INHALE EVERY MORNING.            Durable Medical Equipment        Start     Ordered   12/08/16 [phone]  For home use only DME 3 n 1  Once     12/08/16 0850     Follow-up Information    Malcolm T. Fuller Plan, MD Follow up in 2 month(s).   Specialty:  Gastroenterology Contact information: [address] [place] [address] [phone]        Brigite M Stark, NP .   Contact information: [place] [address] [phone]          No Known Allergies  Consultations: Dr Marin Olp  Procedures/Studies: Dg Abd 1 View  Result Date: 12/03/2016 CLINICAL DATA:  82 y/o M; hard uncomfortable abdomen only able to pass liquid with concern for small bowel obstruction or ileus. EXAM: ABDOMEN - 1 VIEW COMPARISON:  12/01/2016 CT of abdomen and pelvis. FINDINGS: Normal bowel gas pattern. Moderate volume of stool throughout the colon. Multiple renal calculi better characterized on prior CT. Degenerative changes of lower lumbar spine and mild bilateral hip osteoarthrosis. Vascular calcifications. IMPRESSION: Normal bowel gas pattern. Moderate volume  of stool throughout the colon. Electronically Signed   By: Kristine Garbe M.D.   On: 12/03/2016 17:23   Dg Abdomen 1 View  Result Date: 12/01/2016 CLINICAL DATA:  Abdominal distention.  Constipation. EXAM: ABDOMEN - 1 VIEW COMPARISON:  04/18/2016. FINDINGS: Motion blurring. Grossly normal bowel gas pattern. Small bilateral renal calculi are again demonstrated. Lumbar lower thoracic spine degenerative changes. Rectal temperature probe. IMPRESSION: No acute abnormality. Previously demonstrated bilateral renal calculi. Electronically Signed   By: Claudie Revering M.D.   On: 12/01/2016 14:52   US Abdomen Complete  Result Date: 12/06/2016 CLINICAL DATA:  Abnormal transaminases. EXAM: ABDOMEN ULTRASOUND COMPLETE COMPARISON:  Noncontrast CT 12/01/2016 FINDINGS: Gallbladder: Physiologically distended containing sludge and small stones. No gallbladder wall thickening. No sonographic Murphy sign noted by sonographer. Common bile duct: Diameter: 4.8 mm. Liver: 1.6  cm cyst in the left lobe again seen. Mildly heterogeneous in parenchymal echogenicity. Normal directional flow in the main portal vein. IVC: No abnormality visualized. Pancreas: Visualized portion unremarkable. Spleen: Size and appearance within normal limits. Right Kidney: Length: 12.4 cm. No hydronephrosis. There is cortical thinning and increased echogenicity. Nonobstructing 8 mm calculus. There is a 1.6 cm cyst in the interpolar region. Left Kidney: Length: 12.1 cm. No hydronephrosis. There is cortical thinning and increased echogenicity. Shadowing nonobstructing 8 mm calculus medially. 1.3 cm cyst in the lower kidney. Abdominal aorta: No aneurysm visualized. Other findings:   There is right upper quadrant ascites. IMPRESSION: 1. Gallbladder sludge and stones without findings of acute cholecystitis. 2. No biliary dilatation. 3. Small volume right upper quadrant ascites, new from CT 4 days prior. 4. Bilateral renal cysts and stones. Findings  consistent chronic medical renal disease. No hydronephrosis. Electronically Signed   By: Jeb Levering M.D.   On: 12/06/2016 01:23   Dg Chest Port 1 View  Result Date: 12/02/2016 CLINICAL DATA:  Acute onset of shortness of breath. Sepsis. Initial encounter. EXAM: PORTABLE CHEST 1 VIEW COMPARISON:  Chest radiograph performed 12/01/2016 FINDINGS: The lungs are well-aerated. A small left pleural effusion is noted. There is no evidence of pneumothorax. The cardiomediastinal silhouette is mildly enlarged. The patient is status post median sternotomy. A pacemaker is noted overlying the left chest wall, with leads ending overlying the right atrium and right ventricle. No acute osseous abnormalities are seen. IMPRESSION: Small left pleural effusion noted. Lungs otherwise grossly clear. Mild cardiomegaly. Electronically Signed   By: Garald Balding M.D.   On: 12/02/2016 00:39   Dg Chest Port 1 View  Result Date: 12/01/2016 CLINICAL DATA:  Shortness of breath. EXAM: PORTABLE CHEST 1 VIEW COMPARISON:  02/05/2014. FINDINGS: Interval mild enlargement of the cardiac silhouette, accentuated by a decreased inspiration and the portable AP technique. Increased prominence of the pulmonary vasculature and mild increased prominence of the interstitial markings. Stable post CABG changes and left subclavian bipolar pacemaker leads. Diffuse osteopenia. IMPRESSION: Interval mild cardiomegaly and mild changes of congestive heart failure. Electronically Signed   By: Claudie Revering M.D.   On: 12/01/2016 14:54   Ct Renal Stone Study  Result Date: 12/01/2016 CLINICAL DATA:  82 year old male with history of bilateral flank pain. Constipation. Difficulty urinating. History of urinary tract infection. EXAM: CT ABDOMEN AND PELVIS WITHOUT CONTRAST TECHNIQUE: Multidetector CT imaging of the abdomen and pelvis was performed following the standard protocol without IV contrast. COMPARISON:  CT the abdomen and pelvis 06/22/2013. FINDINGS:  Lower chest: Pacemaker leads in the right atrium and right ventricle. Atherosclerotic calcifications in the right coronary artery. Postoperative changes of prior CABG. Mild pleural thickening or trace amount of pleural fluid in the lower right hemithorax. Hepatobiliary: 1.5 cm low-attenuation lesion in the left lobe of the liver is incompletely characterized on today's noncontrast CT examination, but is favored to represent a cyst. Tiny amount of high density material lying dependently in the gallbladder may reflect tiny gallstones or a small amount of biliary sludge in the fundus. No sign to suggest an acute cholecystitis at this time. Pancreas: No definite pancreatic mass or peripancreatic inflammatory changes are noted on today's noncontrast CT examination. Spleen: Unremarkable. Adrenals/Urinary Tract: Multiple small nonobstructive calculi are present within the collecting systems of the kidneys bilaterally measuring up to 7 mm in the interpolar collecting system of the right kidney. No additional calculi are noted along the course of either ureter. No hydroureteronephrosis. Urinary bladder is  nearly completely decompressed, but there is a 3 mm calculus in the urinary bladder (image 76 of series 2). Foley balloon catheter in place with tip in the lumen of the urinary bladder. 1.5 cm low low-attenuation lesion in the interpolar region of the right kidney is incompletely characterized on today's noncontrast CT examination, but is statistically likely a cyst. Bilateral adrenal glands are normal in appearance. Stomach/Bowel: The unenhanced appearance of the stomach is normal. There is no pathologic dilatation of small bowel or colon. Numerous colonic diverticulae are noted, particularly in the sigmoid colon, without surrounding inflammatory changes to suggest an acute diverticulitis at this time. The appendix is not confidently identified and may be surgically absent. Regardless, there are no inflammatory changes  noted adjacent to the cecum to suggest the presence of an acute appendicitis at this time. Vascular/Lymphatic: Aortic atherosclerosis, without evidence of aneurysm in the abdominal or pelvic vasculature. No lymphadenopathy is noted in the abdomen or pelvis on today's noncontrast CT examination. Reproductive: Prostate gland is mildly enlarged measuring 5.0 x 5.6 x 6.1 cm. Seminal vesicles are unremarkable in appearance. Other: Small right inguinal hernia containing only fat. No significant volume of ascites. No pneumoperitoneum. Musculoskeletal: Chronic appearing compression fractures of T12 and L1, most severe at L1 where there is approximately 70% loss of central vertebral body height. There are no aggressive appearing lytic or blastic lesions noted in the visualized portions of the skeleton. IMPRESSION: 1. Multiple nonobstructive calculi are present within the collecting systems of the kidneys bilaterally measuring up to 7 mm on the right side. In addition, there is a 3 mm calculus in the lumen of the urinary bladder. No ureteral stones or findings of urinary tract obstruction are noted at this time. 2. Colonic diverticulosis without evidence of acute diverticulitis at this time. 3. Aortic atherosclerosis. 4. Prostatomegaly. 5. Additional incidental findings, as above. Electronically Signed   By: Vinnie Langton M.D.   On: 12/01/2016 16:01     Subjective: He is feeling better , denies dyspnea, abdominal pain   Discharge Exam: Vitals:   12/08/16 2014 12/09/16 0515  BP: 139/76 130/72  Pulse: 100 (!) 116  Resp:    Temp: 97.7 F (36.5 C) 98.2 F (36.8 C)   Vitals:   12/08/16 1359 12/08/16 1839 12/08/16 2014 12/09/16 0515  BP: 113/67  139/76 130/72  Pulse: (!) 115  100 (!) 116  Resp: 19     Temp: 98.2 F (36.8 C)  97.7 F (36.5 C) 98.2 F (36.8 C)  TempSrc: Oral  Oral Oral  SpO2: 96% 96%    Weight:      Height:        General: Pt is alert, awake, not in acute distress Cardiovascular:  RRR, S1/S2 +, no rubs, no gallops Respiratory: CTA bilaterally, no wheezing, no rhonchi Abdominal: Soft, NT, ND, bowel sounds + Extremities: no edema, no cyanosis    The results of significant diagnostics from this hospitalization (including imaging, microbiology, ancillary and laboratory) are listed below for reference.     Microbiology: Recent Results (from the past 240 hour(s))  Blood Culture (routine x 2)     Status: Abnormal   Collection Time: 12/01/16  2:14 PM  Result Value Ref Range Status   Specimen Description BLOOD RIGHT ANTECUBITAL  Final   Special Requests BOTTLES DRAWN AEROBIC AND ANAEROBIC 5CC  Final   Culture  Setup Time   Final    IN BOTH AEROBIC AND ANAEROBIC BOTTLES GRAM NEGATIVE RODS CRITICAL RESULT CALLED TO, READ  BACK BY AND VERIFIED WITH: Hope Budds AT 0710 ON [id number] BY Rhea Bleacher    Culture KLEBSIELLA OXYTOCA (A)  Final   Report Status 12/04/2016 FINAL  Final   Organism ID, Bacteria KLEBSIELLA OXYTOCA  Final      Susceptibility   Klebsiella oxytoca - MIC*    AMPICILLIN >=32 RESISTANT Resistant     CEFAZOLIN >=64 RESISTANT Resistant     CEFEPIME <=1 SENSITIVE Sensitive     CEFTAZIDIME <=1 SENSITIVE Sensitive     CEFTRIAXONE <=1 SENSITIVE Sensitive     CIPROFLOXACIN <=0.25 SENSITIVE Sensitive     GENTAMICIN <=1 SENSITIVE Sensitive     IMIPENEM <=0.25 SENSITIVE Sensitive     TRIMETH/SULFA <=20 SENSITIVE Sensitive     AMPICILLIN/SULBACTAM 16 INTERMEDIATE Intermediate     PIP/TAZO <=4 SENSITIVE Sensitive     Extended ESBL NEGATIVE Sensitive     * KLEBSIELLA OXYTOCA  Blood Culture (routine x 2)     Status: Abnormal   Collection Time: 12/01/16  2:14 PM  Result Value Ref Range Status   Specimen Description BLOOD RIGHT ANTECUBITAL  Final   Special Requests BOTTLES DRAWN AEROBIC AND ANAEROBIC 5CC  Final   Culture  Setup Time   Final    IN BOTH AEROBIC AND ANAEROBIC BOTTLES GRAM NEGATIVE RODS CRITICAL RESULT CALLED TO, READ BACK BY AND VERIFIED WITH: R.  CLARK, PHARM AT 0710 ON [id number] BY S. YARBROOUGH    Culture (A)  Final    KLEBSIELLA OXYTOCA SUSCEPTIBILITIES PERFORMED ON PREVIOUS CULTURE WITHIN THE LAST 5 DAYS.    Report Status 12/04/2016 FINAL  Final  Blood Culture ID Panel (Reflexed)     Status: Abnormal   Collection Time: 12/01/16  2:14 PM  Result Value Ref Range Status   Enterococcus species NOT DETECTED NOT DETECTED Final   Listeria monocytogenes NOT DETECTED NOT DETECTED Final   Staphylococcus species NOT DETECTED NOT DETECTED Final   Staphylococcus aureus NOT DETECTED NOT DETECTED Final   Streptococcus species NOT DETECTED NOT DETECTED Final   Streptococcus agalactiae NOT DETECTED NOT DETECTED Final   Streptococcus pneumoniae NOT DETECTED NOT DETECTED Final   Streptococcus pyogenes NOT DETECTED NOT DETECTED Final   Acinetobacter baumannii NOT DETECTED NOT DETECTED Final   Enterobacteriaceae species DETECTED (A) NOT DETECTED Final    Comment: CRITICAL RESULT CALLED TO, READ BACK BY AND VERIFIED WITH: R. CLARK, PHARM AT 0710 ON [id number] BY S. YARBROUGH    Enterobacter cloacae complex NOT DETECTED NOT DETECTED Final   Escherichia coli NOT DETECTED NOT DETECTED Final   Klebsiella oxytoca DETECTED (A) NOT DETECTED Final    Comment: CRITICAL RESULT CALLED TO, READ BACK BY AND VERIFIED WITH: R. CLARK, PHARM AT 0710 ON [id number] BY S. YARBROUGH    Klebsiella pneumoniae NOT DETECTED NOT DETECTED Final   Proteus species NOT DETECTED NOT DETECTED Final   Serratia marcescens NOT DETECTED NOT DETECTED Final   Carbapenem resistance NOT DETECTED NOT DETECTED Final   Haemophilus influenzae NOT DETECTED NOT DETECTED Final   Neisseria meningitidis NOT DETECTED NOT DETECTED Final   Pseudomonas aeruginosa NOT DETECTED NOT DETECTED Final   Candida albicans NOT DETECTED NOT DETECTED Final   Candida glabrata NOT DETECTED NOT DETECTED Final   Candida krusei NOT DETECTED NOT DETECTED Final   Candida parapsilosis NOT DETECTED NOT DETECTED Final    Candida tropicalis NOT DETECTED NOT DETECTED Final  Urine culture     Status: Abnormal   Collection Time: 12/01/16  2:28 PM  Result Value Ref Range Status  Specimen Description URINE, RANDOM  Final   Special Requests NONE  Final   Culture >=100,000 COLONIES/mL KLEBSIELLA OXYTOCA (A)  Final   Report Status 12/03/2016 FINAL  Final   Organism ID, Bacteria KLEBSIELLA OXYTOCA (A)  Final      Susceptibility   Klebsiella oxytoca - MIC*    AMPICILLIN >=32 RESISTANT Resistant     CEFAZOLIN >=64 RESISTANT Resistant     CEFTRIAXONE <=1 SENSITIVE Sensitive     CIPROFLOXACIN <=0.25 SENSITIVE Sensitive     GENTAMICIN <=1 SENSITIVE Sensitive     IMIPENEM <=0.25 SENSITIVE Sensitive     NITROFURANTOIN 32 SENSITIVE Sensitive     TRIMETH/SULFA <=20 SENSITIVE Sensitive     AMPICILLIN/SULBACTAM 16 INTERMEDIATE Intermediate     PIP/TAZO <=4 SENSITIVE Sensitive     Extended ESBL NEGATIVE Sensitive     * >=100,000 COLONIES/mL KLEBSIELLA OXYTOCA  Culture, blood (x 2)     Status: Abnormal   Collection Time: 12/01/16  6:00 PM  Result Value Ref Range Status   Specimen Description BLOOD RIGHT ANTECUBITAL  Final   Special Requests   Final    BOTTLES DRAWN AEROBIC AND ANAEROBIC 10CC AER 5CC ANA   Culture  Setup Time   Final    GRAM NEGATIVE RODS ANAEROBIC BOTTLE ONLY CRITICAL RESULT CALLED TO, READ BACK BY AND VERIFIED WITH: V BRYK,PHARMD @0716  12/05/16 MKELLY,MLT    Culture (A)  Final    KLEBSIELLA OXYTOCA SUSCEPTIBILITIES PERFORMED ON PREVIOUS CULTURE WITHIN THE LAST 5 DAYS.    Report Status 12/07/2016 FINAL  Final  Culture, blood (x 2)     Status: None   Collection Time: 12/01/16  6:11 PM  Result Value Ref Range Status   Specimen Description BLOOD LEFT HAND  Final   Special Requests BOTTLES DRAWN AEROBIC ONLY 10CC  Final   Culture NO GROWTH 5 DAYS  Final   Report Status 12/06/2016 FINAL  Final  MRSA PCR Screening     Status: None   Collection Time: 12/02/16  6:07 AM  Result Value Ref Range  Status   MRSA by PCR NEGATIVE NEGATIVE Final    Comment:        The GeneXpert MRSA Assay (FDA approved for NASAL specimens only), is one component of a comprehensive MRSA colonization surveillance program. It is not intended to diagnose MRSA infection nor to guide or monitor treatment for MRSA infections.   Culture, blood (routine x 2)     Status: None (Preliminary result)   Collection Time: 12/05/16  3:04 PM  Result Value Ref Range Status   Specimen Description BLOOD LEFT HAND  Final   Special Requests IN PEDIATRIC BOTTLE 2CC  Final   Culture NO GROWTH 3 DAYS  Final   Report Status PENDING  Incomplete  Culture, blood (routine x 2)     Status: None (Preliminary result)   Collection Time: 12/05/16  3:10 PM  Result Value Ref Range Status   Specimen Description BLOOD LEFT ANTECUBITAL  Final   Special Requests IN PEDIATRIC BOTTLE 2CC  Final   Culture NO GROWTH 3 DAYS  Final   Report Status PENDING  Incomplete     Labs: BNP (last 3 results) No results for input(s): BNP in the last 8760 hours. Basic Metabolic Panel:  Recent Labs Lab 12/03/16 0325 12/05/16 0259 12/06/16 0330 12/07/16 0324 12/09/16 0419  NA 138 140 138 139 139  K 4.3 4.5 4.4 4.1 4.1  CL 105 107 103 103 103  CO2 23 27 25 29  26  GLUCOSE 127* 111* 108* 97 105*  BUN 36* 21* 21* 20 13  CREATININE 1.36* 1.12 1.07 1.00 1.10  CALCIUM 8.8* 9.0 9.2 8.9 8.6*   Liver Function Tests:  Recent Labs Lab 12/03/16 0325 12/05/16 0259 12/06/16 0330  AST 171* 103* 60*  ALT 243* 171* 129*  ALKPHOS 215* 198* 173*  BILITOT 1.3* 0.7 1.1  PROT 6.3* 6.0* 5.9*  ALBUMIN 2.7* 2.6* 2.6*    Recent Labs Lab 12/06/16 0650  LIPASE 23   No results for input(s): AMMONIA in the last 168 hours. CBC:  Recent Labs Lab 12/05/16 0259 12/06/16 0330 12/07/16 0324 12/08/16 0744 12/09/16 0419  WBC 9.5 6.6 5.1 5.2 5.2  HGB 9.2* 8.8* 8.5* 10.2* 9.8*  HCT 28.8* 28.1* 27.0* 31.9* 31.4*  MCV 100.3* 101.4* 101.5* 97.9 100.3*   PLT 114* 106* 115* 131* 134*   Cardiac Enzymes: No results for input(s): CKTOTAL, CKMB, CKMBINDEX, TROPONINI in the last 168 hours. BNP: Invalid input(s): POCBNP CBG:  Recent Labs Lab 12/04/16 2053  GLUCAP 126*   D-Dimer No results for input(s): DDIMER in the last 72 hours. Hgb A1c No results for input(s): HGBA1C in the last 72 hours. Lipid Profile No results for input(s): CHOL, HDL, LDLCALC, TRIG, CHOLHDL, LDLDIRECT in the last 72 hours. Thyroid function studies No results for input(s): TSH, T4TOTAL, T3FREE, THYROIDAB in the last 72 hours.  Invalid input(s): FREET3 Anemia work up No results for input(s): VITAMINB12, FOLATE, FERRITIN, TIBC, IRON, RETICCTPCT in the last 72 hours. Urinalysis    Component Value Date/Time   COLORURINE AMBER (A) 12/01/2016 1428   APPEARANCEUR TURBID (A) 12/01/2016 1428   LABSPEC 1.024 12/01/2016 1428   PHURINE 5.5 12/01/2016 1428   GLUCOSEU NEGATIVE 12/01/2016 1428   HGBUR MODERATE (A) 12/01/2016 1428   BILIRUBINUR NEGATIVE 12/01/2016 1428   KETONESUR NEGATIVE 12/01/2016 1428   PROTEINUR 100 (A) 12/01/2016 1428   UROBILINOGEN 0.2 01/01/2011 1624   NITRITE POSITIVE (A) 12/01/2016 1428   LEUKOCYTESUR LARGE (A) 12/01/2016 1428   Sepsis Labs Invalid input(s): PROCALCITONIN,  WBC,  LACTICIDVEN Microbiology Recent Results (from the past 240 hour(s))  Blood Culture (routine x 2)     Status: Abnormal   Collection Time: 12/01/16  2:14 PM  Result Value Ref Range Status   Specimen Description BLOOD RIGHT ANTECUBITAL  Final   Special Requests BOTTLES DRAWN AEROBIC AND ANAEROBIC 5CC  Final   Culture  Setup Time   Final    IN BOTH AEROBIC AND ANAEROBIC BOTTLES GRAM NEGATIVE RODS CRITICAL RESULT CALLED TO, READ BACK BY AND VERIFIED WITH: RTollie Eth AT 0710 ON [id number] BY S. YARBROUGH    Culture KLEBSIELLA OXYTOCA (A)  Final   Report Status 12/04/2016 FINAL  Final   Organism ID, Bacteria KLEBSIELLA OXYTOCA  Final      Susceptibility    Klebsiella oxytoca - MIC*    AMPICILLIN >=32 RESISTANT Resistant     CEFAZOLIN >=64 RESISTANT Resistant     CEFEPIME <=1 SENSITIVE Sensitive     CEFTAZIDIME <=1 SENSITIVE Sensitive     CEFTRIAXONE <=1 SENSITIVE Sensitive     CIPROFLOXACIN <=0.25 SENSITIVE Sensitive     GENTAMICIN <=1 SENSITIVE Sensitive     IMIPENEM <=0.25 SENSITIVE Sensitive     TRIMETH/SULFA <=20 SENSITIVE Sensitive     AMPICILLIN/SULBACTAM 16 INTERMEDIATE Intermediate     PIP/TAZO <=4 SENSITIVE Sensitive     Extended ESBL NEGATIVE Sensitive     * KLEBSIELLA OXYTOCA  Blood Culture (routine x 2)     Status:  Abnormal   Collection Time: 12/01/16  2:14 PM  Result Value Ref Range Status   Specimen Description BLOOD RIGHT ANTECUBITAL  Final   Special Requests BOTTLES DRAWN AEROBIC AND ANAEROBIC 5CC  Final   Culture  Setup Time   Final    IN BOTH AEROBIC AND ANAEROBIC BOTTLES GRAM NEGATIVE RODS CRITICAL RESULT CALLED TO, READ BACK BY AND VERIFIED WITH: R. CLARK, PHARM AT 0710 ON [id number] BY S. YARBROOUGH    Culture (A)  Final    KLEBSIELLA OXYTOCA SUSCEPTIBILITIES PERFORMED ON PREVIOUS CULTURE WITHIN THE LAST 5 DAYS.    Report Status 12/04/2016 FINAL  Final  Blood Culture ID Panel (Reflexed)     Status: Abnormal   Collection Time: 12/01/16  2:14 PM  Result Value Ref Range Status   Enterococcus species NOT DETECTED NOT DETECTED Final   Listeria monocytogenes NOT DETECTED NOT DETECTED Final   Staphylococcus species NOT DETECTED NOT DETECTED Final   Staphylococcus aureus NOT DETECTED NOT DETECTED Final   Streptococcus species NOT DETECTED NOT DETECTED Final   Streptococcus agalactiae NOT DETECTED NOT DETECTED Final   Streptococcus pneumoniae NOT DETECTED NOT DETECTED Final   Streptococcus pyogenes NOT DETECTED NOT DETECTED Final   Acinetobacter baumannii NOT DETECTED NOT DETECTED Final   Enterobacteriaceae species DETECTED (A) NOT DETECTED Final    Comment: CRITICAL RESULT CALLED TO, READ BACK BY AND VERIFIED WITH: R.  CLARK, PHARM AT 0710 ON [id number] BY S. YARBROUGH    Enterobacter cloacae complex NOT DETECTED NOT DETECTED Final   Escherichia coli NOT DETECTED NOT DETECTED Final   Klebsiella oxytoca DETECTED (A) NOT DETECTED Final    Comment: CRITICAL RESULT CALLED TO, READ BACK BY AND VERIFIED WITH: R. CLARK, PHARM AT 0710 ON [id number] BY S. YARBROUGH    Klebsiella pneumoniae NOT DETECTED NOT DETECTED Final   Proteus species NOT DETECTED NOT DETECTED Final   Serratia marcescens NOT DETECTED NOT DETECTED Final   Carbapenem resistance NOT DETECTED NOT DETECTED Final   Haemophilus influenzae NOT DETECTED NOT DETECTED Final   Neisseria meningitidis NOT DETECTED NOT DETECTED Final   Pseudomonas aeruginosa NOT DETECTED NOT DETECTED Final   Candida albicans NOT DETECTED NOT DETECTED Final   Candida glabrata NOT DETECTED NOT DETECTED Final   Candida krusei NOT DETECTED NOT DETECTED Final   Candida parapsilosis NOT DETECTED NOT DETECTED Final   Candida tropicalis NOT DETECTED NOT DETECTED Final  Urine culture     Status: Abnormal   Collection Time: 12/01/16  2:28 PM  Result Value Ref Range Status   Specimen Description URINE, RANDOM  Final   Special Requests NONE  Final   Culture >=100,000 COLONIES/mL KLEBSIELLA OXYTOCA (A)  Final   Report Status 12/03/2016 FINAL  Final   Organism ID, Bacteria KLEBSIELLA OXYTOCA (A)  Final      Susceptibility   Klebsiella oxytoca - MIC*    AMPICILLIN >=32 RESISTANT Resistant     CEFAZOLIN >=64 RESISTANT Resistant     CEFTRIAXONE <=1 SENSITIVE Sensitive     CIPROFLOXACIN <=0.25 SENSITIVE Sensitive     GENTAMICIN <=1 SENSITIVE Sensitive     IMIPENEM <=0.25 SENSITIVE Sensitive     NITROFURANTOIN 32 SENSITIVE Sensitive     TRIMETH/SULFA <=20 SENSITIVE Sensitive     AMPICILLIN/SULBACTAM 16 INTERMEDIATE Intermediate     PIP/TAZO <=4 SENSITIVE Sensitive     Extended ESBL NEGATIVE Sensitive     * >=100,000 COLONIES/mL KLEBSIELLA OXYTOCA  Culture, blood (x 2)     Status:  Abnormal   Collection  Time: 12/01/16  6:00 PM  Result Value Ref Range Status   Specimen Description BLOOD RIGHT ANTECUBITAL  Final   Special Requests   Final    BOTTLES DRAWN AEROBIC AND ANAEROBIC 10CC AER 5CC ANA   Culture  Setup Time   Final    GRAM NEGATIVE RODS ANAEROBIC BOTTLE ONLY CRITICAL RESULT CALLED TO, READ BACK BY AND VERIFIED WITH: V BRYK,PHARMD @0716  12/05/16 MKELLY,MLT    Culture (A)  Final    KLEBSIELLA OXYTOCA SUSCEPTIBILITIES PERFORMED ON PREVIOUS CULTURE WITHIN THE LAST 5 DAYS.    Report Status 12/07/2016 FINAL  Final  Culture, blood (x 2)     Status: None   Collection Time: 12/01/16  6:11 PM  Result Value Ref Range Status   Specimen Description BLOOD LEFT HAND  Final   Special Requests BOTTLES DRAWN AEROBIC ONLY 10CC  Final   Culture NO GROWTH 5 DAYS  Final   Report Status 12/06/2016 FINAL  Final  MRSA PCR Screening     Status: None   Collection Time: 12/02/16  6:07 AM  Result Value Ref Range Status   MRSA by PCR NEGATIVE NEGATIVE Final    Comment:        The GeneXpert MRSA Assay (FDA approved for NASAL specimens only), is one component of a comprehensive MRSA colonization surveillance program. It is not intended to diagnose MRSA infection nor to guide or monitor treatment for MRSA infections.   Culture, blood (routine x 2)     Status: None (Preliminary result)   Collection Time: 12/05/16  3:04 PM  Result Value Ref Range Status   Specimen Description BLOOD LEFT HAND  Final   Special Requests IN PEDIATRIC BOTTLE 2CC  Final   Culture NO GROWTH 3 DAYS  Final   Report Status PENDING  Incomplete  Culture, blood (routine x 2)     Status: None (Preliminary result)   Collection Time: 12/05/16  3:10 PM  Result Value Ref Range Status   Specimen Description BLOOD LEFT ANTECUBITAL  Final   Special Requests IN PEDIATRIC BOTTLE 2CC  Final   Culture NO GROWTH 3 DAYS  Final   Report Status PENDING  Incomplete     Time coordinating discharge: Over 30  minutes  SIGNED:   Elmarie Shiley, MD  [facility] 12/09/2016, 9:17 AM Pager   If 7PM-7AM, please contact night-coverage [email or web address] Password [email or web address]

## 2016-12-09 NOTE — Telephone Encounter (Signed)
Thank you :)

## 2016-12-10 ENCOUNTER — Other Ambulatory Visit (HOSPITAL_COMMUNITY): Payer: Medicare Other

## 2016-12-10 ENCOUNTER — Ambulatory Visit (INDEPENDENT_AMBULATORY_CARE_PROVIDER_SITE_OTHER): Payer: Medicare Other | Admitting: *Deleted

## 2016-12-10 ENCOUNTER — Telehealth: Payer: Self-pay | Admitting: Cardiology

## 2016-12-10 DIAGNOSIS — I4430 Unspecified atrioventricular block: Secondary | ICD-10-CM | POA: Diagnosis not present

## 2016-12-10 LAB — CULTURE, BLOOD (ROUTINE X 2)
Culture: NO GROWTH
Culture: NO GROWTH

## 2016-12-10 NOTE — Progress Notes (Signed)
Remote pacemaker transmission.   

## 2016-12-10 NOTE — Telephone Encounter (Signed)
Confirmed remote transmission w/ pt daughter.   

## 2016-12-11 DIAGNOSIS — N189 Chronic kidney disease, unspecified: Secondary | ICD-10-CM | POA: Diagnosis not present

## 2016-12-11 DIAGNOSIS — D472 Monoclonal gammopathy: Secondary | ICD-10-CM | POA: Diagnosis not present

## 2016-12-11 DIAGNOSIS — I950 Idiopathic hypotension: Secondary | ICD-10-CM | POA: Diagnosis not present

## 2016-12-11 DIAGNOSIS — I2510 Atherosclerotic heart disease of native coronary artery without angina pectoris: Secondary | ICD-10-CM | POA: Diagnosis not present

## 2016-12-11 DIAGNOSIS — J439 Emphysema, unspecified: Secondary | ICD-10-CM | POA: Diagnosis not present

## 2016-12-11 DIAGNOSIS — N390 Urinary tract infection, site not specified: Secondary | ICD-10-CM | POA: Diagnosis not present

## 2016-12-12 DIAGNOSIS — J439 Emphysema, unspecified: Secondary | ICD-10-CM | POA: Diagnosis not present

## 2016-12-12 DIAGNOSIS — D472 Monoclonal gammopathy: Secondary | ICD-10-CM | POA: Diagnosis not present

## 2016-12-12 DIAGNOSIS — N390 Urinary tract infection, site not specified: Secondary | ICD-10-CM | POA: Diagnosis not present

## 2016-12-12 DIAGNOSIS — I2510 Atherosclerotic heart disease of native coronary artery without angina pectoris: Secondary | ICD-10-CM | POA: Diagnosis not present

## 2016-12-12 DIAGNOSIS — I950 Idiopathic hypotension: Secondary | ICD-10-CM | POA: Diagnosis not present

## 2016-12-12 DIAGNOSIS — N189 Chronic kidney disease, unspecified: Secondary | ICD-10-CM | POA: Diagnosis not present

## 2016-12-16 DIAGNOSIS — D472 Monoclonal gammopathy: Secondary | ICD-10-CM | POA: Diagnosis not present

## 2016-12-16 DIAGNOSIS — I950 Idiopathic hypotension: Secondary | ICD-10-CM | POA: Diagnosis not present

## 2016-12-16 DIAGNOSIS — N390 Urinary tract infection, site not specified: Secondary | ICD-10-CM | POA: Diagnosis not present

## 2016-12-16 DIAGNOSIS — N189 Chronic kidney disease, unspecified: Secondary | ICD-10-CM | POA: Diagnosis not present

## 2016-12-16 DIAGNOSIS — J439 Emphysema, unspecified: Secondary | ICD-10-CM | POA: Diagnosis not present

## 2016-12-16 DIAGNOSIS — I2510 Atherosclerotic heart disease of native coronary artery without angina pectoris: Secondary | ICD-10-CM | POA: Diagnosis not present

## 2016-12-17 ENCOUNTER — Ambulatory Visit: Payer: Medicare Other | Admitting: Cardiology

## 2016-12-17 ENCOUNTER — Other Ambulatory Visit: Payer: Self-pay | Admitting: Cardiovascular Disease

## 2016-12-17 DIAGNOSIS — D472 Monoclonal gammopathy: Secondary | ICD-10-CM | POA: Diagnosis not present

## 2016-12-17 DIAGNOSIS — I4892 Unspecified atrial flutter: Secondary | ICD-10-CM

## 2016-12-17 DIAGNOSIS — J439 Emphysema, unspecified: Secondary | ICD-10-CM | POA: Diagnosis not present

## 2016-12-17 DIAGNOSIS — I950 Idiopathic hypotension: Secondary | ICD-10-CM | POA: Diagnosis not present

## 2016-12-17 DIAGNOSIS — N189 Chronic kidney disease, unspecified: Secondary | ICD-10-CM | POA: Diagnosis not present

## 2016-12-17 DIAGNOSIS — N390 Urinary tract infection, site not specified: Secondary | ICD-10-CM | POA: Diagnosis not present

## 2016-12-17 DIAGNOSIS — I2510 Atherosclerotic heart disease of native coronary artery without angina pectoris: Secondary | ICD-10-CM | POA: Diagnosis not present

## 2016-12-17 NOTE — Telephone Encounter (Signed)
New Message  Pts daughter voiced at end of year with insurance pt can get three months of prescriptions free.   Pts daughter voiced wanting prescriptions sent by the end of the month for three months for eliquis.   *STAT* If patient is at the pharmacy, call can be transferred to refill team.   1. Which medications need to be refilled? (please list name of each medication and dose if known) apixaban (Eliquis) 5 mg tabs take every 12 hours  2. Which pharmacy/location (including street and city if local pharmacy) is medication to be sent to? [id number], [address] [place], [place]  3. Do they need a 30 day or 90 day supply? 90 day supply

## 2016-12-18 ENCOUNTER — Encounter: Payer: Self-pay | Admitting: Cardiology

## 2016-12-18 DIAGNOSIS — D472 Monoclonal gammopathy: Secondary | ICD-10-CM | POA: Diagnosis not present

## 2016-12-18 DIAGNOSIS — I2510 Atherosclerotic heart disease of native coronary artery without angina pectoris: Secondary | ICD-10-CM | POA: Diagnosis not present

## 2016-12-18 DIAGNOSIS — J439 Emphysema, unspecified: Secondary | ICD-10-CM | POA: Diagnosis not present

## 2016-12-18 DIAGNOSIS — N189 Chronic kidney disease, unspecified: Secondary | ICD-10-CM | POA: Diagnosis not present

## 2016-12-18 DIAGNOSIS — I950 Idiopathic hypotension: Secondary | ICD-10-CM | POA: Diagnosis not present

## 2016-12-18 DIAGNOSIS — N390 Urinary tract infection, site not specified: Secondary | ICD-10-CM | POA: Diagnosis not present

## 2016-12-18 MED ORDER — APIXABAN 5 MG PO TABS: 5.0000 mg | ORAL_TABLET | Freq: Two times a day (BID) | ORAL | 2 refills | Status: DC

## 2016-12-18 NOTE — Telephone Encounter (Signed)
Rx(s) sent to pharmacy electronically.  

## 2016-12-19 DIAGNOSIS — M1712 Unilateral primary osteoarthritis, left knee: Secondary | ICD-10-CM | POA: Diagnosis not present

## 2016-12-24 DIAGNOSIS — J439 Emphysema, unspecified: Secondary | ICD-10-CM | POA: Diagnosis not present

## 2016-12-24 DIAGNOSIS — I950 Idiopathic hypotension: Secondary | ICD-10-CM | POA: Diagnosis not present

## 2016-12-24 DIAGNOSIS — I2510 Atherosclerotic heart disease of native coronary artery without angina pectoris: Secondary | ICD-10-CM | POA: Diagnosis not present

## 2016-12-24 DIAGNOSIS — D472 Monoclonal gammopathy: Secondary | ICD-10-CM | POA: Diagnosis not present

## 2016-12-24 DIAGNOSIS — N189 Chronic kidney disease, unspecified: Secondary | ICD-10-CM | POA: Diagnosis not present

## 2016-12-24 DIAGNOSIS — N390 Urinary tract infection, site not specified: Secondary | ICD-10-CM | POA: Diagnosis not present

## 2016-12-25 DIAGNOSIS — J439 Emphysema, unspecified: Secondary | ICD-10-CM | POA: Diagnosis not present

## 2016-12-25 DIAGNOSIS — I950 Idiopathic hypotension: Secondary | ICD-10-CM | POA: Diagnosis not present

## 2016-12-25 DIAGNOSIS — D472 Monoclonal gammopathy: Secondary | ICD-10-CM | POA: Diagnosis not present

## 2016-12-25 DIAGNOSIS — N189 Chronic kidney disease, unspecified: Secondary | ICD-10-CM | POA: Diagnosis not present

## 2016-12-25 DIAGNOSIS — Z1389 Encounter for screening for other disorder: Secondary | ICD-10-CM | POA: Diagnosis not present

## 2016-12-25 DIAGNOSIS — A419 Sepsis, unspecified organism: Secondary | ICD-10-CM | POA: Diagnosis not present

## 2016-12-25 DIAGNOSIS — Z6826 Body mass index (BMI) 26.0-26.9, adult: Secondary | ICD-10-CM | POA: Diagnosis not present

## 2016-12-25 DIAGNOSIS — N179 Acute kidney failure, unspecified: Secondary | ICD-10-CM | POA: Diagnosis not present

## 2016-12-25 DIAGNOSIS — I4892 Unspecified atrial flutter: Secondary | ICD-10-CM | POA: Diagnosis not present

## 2016-12-25 DIAGNOSIS — D638 Anemia in other chronic diseases classified elsewhere: Secondary | ICD-10-CM | POA: Diagnosis not present

## 2016-12-25 DIAGNOSIS — I2510 Atherosclerotic heart disease of native coronary artery without angina pectoris: Secondary | ICD-10-CM | POA: Diagnosis not present

## 2016-12-25 DIAGNOSIS — N390 Urinary tract infection, site not specified: Secondary | ICD-10-CM | POA: Diagnosis not present

## 2016-12-25 DIAGNOSIS — Z9181 History of falling: Secondary | ICD-10-CM | POA: Diagnosis not present

## 2016-12-25 DIAGNOSIS — R740 Nonspecific elevation of levels of transaminase and lactic acid dehydrogenase [LDH]: Secondary | ICD-10-CM | POA: Diagnosis not present

## 2016-12-25 LAB — CUP PACEART REMOTE DEVICE CHECK
Battery Impedance: 135 Ohm
Battery Remaining Longevity: 144 mo
Battery Voltage: 2.79 V
Brady Statistic AP VP Percent: 0 %
Brady Statistic AS VP Percent: 1 %
Implantable Lead Location: 753859
Implantable Lead Location: 753860
Implantable Lead Model: 5076
Implantable Lead Model: 5076
Implantable Pulse Generator Implant Date: 20150206
Lead Channel Pacing Threshold Amplitude: 0.75 V
Lead Channel Sensing Intrinsic Amplitude: 2.8 mV
Lead Channel Setting Pacing Amplitude: 2.5 V
Lead Channel Setting Pacing Pulse Width: 0.4 ms
MDC IDC LEAD IMPLANT DT: 20150206
MDC IDC LEAD IMPLANT DT: 20150206
MDC IDC MSMT LEADCHNL RA IMPEDANCE VALUE: 432 Ohm
MDC IDC MSMT LEADCHNL RA PACING THRESHOLD PULSEWIDTH: 0.4 ms
MDC IDC MSMT LEADCHNL RV IMPEDANCE VALUE: 515 Ohm
MDC IDC MSMT LEADCHNL RV PACING THRESHOLD AMPLITUDE: 0.375 V
MDC IDC MSMT LEADCHNL RV PACING THRESHOLD PULSEWIDTH: 0.4 ms
MDC IDC MSMT LEADCHNL RV SENSING INTR AMPL: 8 mV
MDC IDC SESS DTM: 20171212190725
MDC IDC SET LEADCHNL RA PACING AMPLITUDE: 2 V
MDC IDC SET LEADCHNL RV SENSING SENSITIVITY: 4 mV
MDC IDC STAT BRADY AP VS PERCENT: 0 %
MDC IDC STAT BRADY AS VS PERCENT: 99 %

## 2017-01-02 DIAGNOSIS — H353211 Exudative age-related macular degeneration, right eye, with active choroidal neovascularization: Secondary | ICD-10-CM | POA: Diagnosis not present

## 2017-01-03 ENCOUNTER — Telehealth: Payer: Self-pay | Admitting: *Deleted

## 2017-01-03 ENCOUNTER — Other Ambulatory Visit: Payer: Self-pay | Admitting: *Deleted

## 2017-01-03 MED ORDER — DIGOXIN 125 MCG PO TABS
0.1250 mg | ORAL_TABLET | Freq: Every day | ORAL | 3 refills | Status: DC
Start: 2017-01-03 — End: 2017-12-01

## 2017-01-03 NOTE — Telephone Encounter (Signed)
Left message per Dr Sallyanne Kuster to start Lanoxn prescription. [facility] chart he is to take the medication as directed on the bottle. Call back if questions or concerns.

## 2017-01-03 NOTE — Telephone Encounter (Signed)
Digoxin prescription sent to [facility]  per Dr Sallyanne Kuster.

## 2017-01-03 NOTE — Telephone Encounter (Signed)
-----   Message from Sanda Klein, MD sent at 01/02/2017 10:09 AM EST ----- Please start digoxin 0.125 mg daily. MCr

## 2017-01-06 ENCOUNTER — Telehealth: Payer: Self-pay | Admitting: Cardiovascular Disease

## 2017-01-06 NOTE — Telephone Encounter (Signed)
[name] is calling because she states that her father a prescribed to new medication and she is wondering why. [name] is requesting that someone call her and in regards to the new prescription. Thanks.

## 2017-01-06 NOTE — Telephone Encounter (Signed)
Spoke to daughter [name].  Confused because she got a call from pharmacy indicating a prescription was ready for pickup for the patient, but she didn't have any conversation regarding new Rx. Reviewed - saw that digoxin was called to pharmacy last week, prescribed for the patient based on PM interrogation 12/10/16.  I explained rationale for starting this med based on physician notes.  She wants to wait on starting this medication - notes he sees [place] on 1/11 and she'd like to see someone in office and discuss before initiation. Advised this should be OK, but I would defer to Dr. Sallyanne Kuster and verify, can call back w clarification. She may benefit from a call from physician to explain.

## 2017-01-07 ENCOUNTER — Ambulatory Visit (INDEPENDENT_AMBULATORY_CARE_PROVIDER_SITE_OTHER): Payer: Medicare Other | Admitting: Emergency Medicine

## 2017-01-07 ENCOUNTER — Encounter: Payer: Self-pay | Admitting: Emergency Medicine

## 2017-01-07 VITALS — BP 114/76 | HR 112 | Ht 69.0 in | Wt 185.0 lb

## 2017-01-07 DIAGNOSIS — J449 Chronic obstructive pulmonary disease, unspecified: Secondary | ICD-10-CM

## 2017-01-07 NOTE — Patient Instructions (Signed)
Please continue your Spiriva and Symbicort until your most recent prescriptions are running out.  At that time we will try changing these two medications to an alternative. We will start samples of Stiolto 2 puffs once a day for 1 month. If you prefer the Stiolto then we will write a script for this to your pharmacy.  Take albuterol 2 puffs up to every 4 hours if needed for shortness of breath. You may want to pretreat exertion. Follow with Dr Lamonte Sakai in 12 months or sooner if you have any problems

## 2017-01-07 NOTE — Progress Notes (Signed)
83 yo man with COPD, CAD/CABG (06) followed by Dr Claiborne Billings.   ROV 09/10/12 -- COPD, allergic rhinitis and associated cough. Not on nasonex right now. He is on loratadine, spiriva + symbicort. Doing well. Last AE was this Spring. Needs the flu shot.   ROV 03/18/13 -- COPD, allergies, CAD, MGUS ([place]). Has been doing well since last time. Never uses SABA. No exacerbations since last time.  He follows with Dr Claiborne Billings for TTE 04/13/13.  On loratadine, allergies well control.   ROV 08/26/13 -- COPD, allergies, CAD. Hx cough. He has noticed more exertional SOB for last 5-6 months. 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 He is on spiriva and symbicort. He was asked by Dr Claiborne Billings to back off some on his aerobic exercise and at the [employer]. Wt is stable. Very little cough, no wheeze. Never uses albuterol. He had an episode of syncope at bowling alley [place] > daughter reports that he had SVT, started on metoprolol.   ROV 10/07/13 -- COPD and allergic rhinitis, cough. Last time we ordered repeat PFT due to worsening SOB. The breathing is still about the same - not as good as 1-2 years ago. His PFT show stable spirometry. The lung volumes show a decreased TLC compared with 2009.   ROV 08/26/14 -- COPD and allergic rhinitis, cough. He is doing well - continues to stay active. He has slowed down some but very little. 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 He is on Spiriva and Symbicort.  No flares, cough, wheeze.   ROV 11/30/15 -- patient has a history of COPD and allergic rhinitis. He's also been followed by Dr Katheran Awe for MGUS and Dr. Claiborne Billings for coronary disease. He has been managed on symbicort and spiriva.  He is having a but more heavy breathing since last time, but he still bowls, plans to do the [employer]. No flares since last time. He does not have an albuterol inhaler.   ROV 01/07/17 -- this is a follow-up visit for patient with COPD and allergic  rhinitis. He also has a history of coronary artery disease and MGUS. He has been managed on Spiriva and Symbicort. He was hospitalized for a UTI last month, bacteremia. He is having more exertional dyspnea than last year. He is able to bowl, take out garbage. Very rarely uses albuterol. No flares.                                                                                                                                                                          CAT Score 08/26/2014 03/18/2013  Total CAT Score 13 6  EXAM :  Vitals:   01/07/17 1510  BP: 114/76  BP Location: Left Arm  Cuff Size: Normal  Pulse: (!) 112  SpO2: 96%  Weight: 185 lb (83.9 kg)  Height: 5\' 9"  (1.753 m)   Gen: Pleasant,  in no distress,  normal affect  ENT: No lesions,  mouth clear,  oropharynx clear, no postnasal drip  Neck: No JVD, no TMG, no carotid bruits  Lungs: No use of accessory muscles, distant, no wheeze or crackles, diminshed BS in bases   Cardiovascular: RRR, heart sounds normal, no murmur or gallops, no peripheral edema  Musculoskeletal: No deformities, no cyanosis or clubbing  Neuro: alert, non focal  Skin: Warm, no lesions or rashes   COPD (chronic obstructive pulmonary disease) No exacerbations but he does seem to be a little bit more limited compared with prior years. We discussed utilizing his albuterol more liberally. Also discussed possibly changing his Spiriva and Symbicort to a LABA / LAMA combination. We will pursue this when his current medications are running out.  Please continue your Spiriva and Symbicort until your most recent prescriptions are running out.  At that time we will try changing these two medications to an alternative. We will start samples of Stiolto 2 puffs once a day for 1 month. If you prefer the Stiolto then we will write a script for this to your pharmacy.  Take albuterol 2 puffs up to every 4 hours if needed for shortness of breath. You may want to  pretreat exertion. Follow with Dr Lamonte Sakai in 12 months or sooner if you have any problems    Baltazar Apo, MD, PhD 01/07/2017, 3:38 PM [facility] [phone] or if no answer [phone]

## 2017-01-07 NOTE — Assessment & Plan Note (Signed)
No exacerbations but he does seem to be a little bit more limited compared with prior years. We discussed utilizing his albuterol more liberally. Also discussed possibly changing his Spiriva and Symbicort to a LABA / LAMA combination. We will pursue this when his current medications are running out.  Please continue your Spiriva and Symbicort until your most recent prescriptions are running out.  At that time we will try changing these two medications to an alternative. We will start samples of Stiolto 2 puffs once a day for 1 month. If you prefer the Stiolto then we will write a script for this to your pharmacy.  Take albuterol 2 puffs up to every 4 hours if needed for shortness of breath. You may want to pretreat exertion. Follow with Dr Lamonte Sakai in 12 months or sooner if you have any problems

## 2017-01-07 NOTE — Telephone Encounter (Signed)
Ovid Curd, This is Dr. Evette Georges patient, but the discussion began based on his pacemaker download which showed rapid rates. I discussed this with Dr. Claiborne Billings and sent in the Rx. Please ask Dr. Claiborne Billings to address. I have no problem with waiting until the appt with [facility]. Thanks

## 2017-01-08 NOTE — Telephone Encounter (Signed)
Agreed with plans to initiate to help with rate control.

## 2017-01-09 ENCOUNTER — Ambulatory Visit (INDEPENDENT_AMBULATORY_CARE_PROVIDER_SITE_OTHER): Payer: Medicare Other | Admitting: Cardiology

## 2017-01-09 ENCOUNTER — Encounter: Payer: Self-pay | Admitting: Cardiology

## 2017-01-09 DIAGNOSIS — I951 Orthostatic hypotension: Secondary | ICD-10-CM | POA: Diagnosis not present

## 2017-01-09 DIAGNOSIS — Z950 Presence of cardiac pacemaker: Secondary | ICD-10-CM | POA: Diagnosis not present

## 2017-01-09 DIAGNOSIS — Z951 Presence of aortocoronary bypass graft: Secondary | ICD-10-CM | POA: Diagnosis not present

## 2017-01-09 DIAGNOSIS — J439 Emphysema, unspecified: Secondary | ICD-10-CM

## 2017-01-09 DIAGNOSIS — Z7901 Long term (current) use of anticoagulants: Secondary | ICD-10-CM

## 2017-01-09 DIAGNOSIS — I484 Atypical atrial flutter: Secondary | ICD-10-CM | POA: Diagnosis not present

## 2017-01-09 DIAGNOSIS — D472 Monoclonal gammopathy: Secondary | ICD-10-CM

## 2017-01-09 NOTE — Assessment & Plan Note (Signed)
Dr Marin Olp follows

## 2017-01-09 NOTE — Assessment & Plan Note (Signed)
MDT pacemaker implanted 2015

## 2017-01-09 NOTE — Assessment & Plan Note (Deleted)
Followed by Dr Lamonte Sakai- this has been stable

## 2017-01-09 NOTE — Assessment & Plan Note (Signed)
On midodrine

## 2017-01-09 NOTE — Patient Instructions (Addendum)
Medication Instructions:  START THE DIGOXIN AS ORDERED  Labwork: NONE  Testing/Procedures: NONE  Follow-Up: Your physician recommends that you schedule a follow-up appointment in: 2 WEEKS WEEKS  If you need a refill on your cardiac medications before your next appointment, please call your pharmacy.

## 2017-01-09 NOTE — Assessment & Plan Note (Signed)
Followed by Dr Lamonte Sakai- this has been stable

## 2017-01-09 NOTE — Assessment & Plan Note (Signed)
x4, LIMA to LAD, vein to distal circumflex, sequential vein to intermediate and obtuse marginal vessel (2006) Myoview Sept 2017- low risk

## 2017-01-09 NOTE — Assessment & Plan Note (Signed)
VR 115. He is tolerating this well

## 2017-01-09 NOTE — Progress Notes (Signed)
01/09/2017 [name]   1929-03-02  [id number]  Primary Physician Leonides Sake, MD Primary Cardiologist: Dr Kelly/ Dr Sallyanne Kuster  HPI:  83 y/o male [place], s/p CABG x 4 in '06. He has had problems with PAF/SSS and had a MDT pacemaker placed in 2015. Myoview in Sept 2017 was low risk, echo in Dec 2017 showed preserved LVF (normal LA). He has had problems with orthostatic hypotension, COPD, and is HOH. Despite this he is very active. 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 "I always win my age division because there are only two other people in it". He bowls every Monday. He is in the office today with his daughter.   He was hospitalized in Dec with Kelbsiella UTI sepsis. At discharge it was recommended he increase his Lopressor to 100 mg BID. Later Lanoxin 0.125 mg was added secondary to increased rates on telemetry check. His daughter was hesitant to make these changes without discussing with his cardiologist. He is asymptomatic as far as I can tell. His HR is 120 at rest.    Current Outpatient Prescriptions  Medication Sig Dispense Refill  . acetaminophen (TYLENOL) 500 MG tablet Take 1,000 mg by mouth every 6 (six) hours as needed.    Marland Kitchen albuterol (PROVENTIL HFA;VENTOLIN HFA) 108 (90 Base) MCG/ACT inhaler Inhale 2 puffs into the lungs every 4 (four) hours as needed for wheezing or shortness of breath. 3 Inhaler 0  . apixaban (ELIQUIS) 5 MG TABS tablet Take 1 tablet (5 mg total) by mouth every 12 (twelve) hours. 180 tablet 2  . budesonide-formoterol (SYMBICORT) 80-4.5 MCG/ACT inhaler INHALE 2 PUFFS INTO THE LUNGS 2 (TWO) TIMES DAILY. 3 Inhaler 0  . diphenhydramine-acetaminophen (TYLENOL PM) 25-500 MG TABS Take 2 tablets by mouth at bedtime.     . ENSURE (ENSURE) Take 1 Can by mouth every morning.    . metoprolol (LOPRESSOR) 50 MG tablet Take 50 mg by mouth as directed. 1 ANE 1/2 TABLET BY MOUTH TWICE A DAY    . midodrine  (PROAMATINE) 5 MG tablet TAKE 1 TABLET (5 MG TOTAL) BY MOUTH 3 (THREE) TIMES DAILY. 270 tablet 4  . niacin (NIASPAN) 500 MG CR tablet TAKE 1 TABLET (500 MG TOTAL) BY MOUTH AT BEDTIME. 90 tablet 3  . Probiotic Product ([facility] SENIOR PROBIOTIC PO) Take 1 capsule by mouth at bedtime. Once daily    . Tamsulosin HCl (FLOMAX) 0.4 MG CAPS Take 0.4 mg by mouth at bedtime.     Marland Kitchen tiotropium (SPIRIVA HANDIHALER) 18 MCG inhalation capsule PLACE 1 CAPSULE (18 MCG TOTAL) INTO INHALER AND INHALE EVERY MORNING. 90 capsule 0  . digoxin (LANOXIN) 0.125 MG tablet Take 1 tablet (0.125 mg total) by mouth daily. (Patient not taking: Reported on 01/09/2017) 90 tablet 3   No current facility-administered medications for this visit.     No Known Allergies  Social History   Social History  . Marital status: Married    Spouse name: N/A  . Number of children: N/A  . Years of education: N/A   Occupational History  . retired long distance Administrator   . maintenence    Social History Main Topics  . Smoking status: Former Smoker    Packs/day: 1.00    Years: 47.00    Types: Cigarettes    Start date: 12/02/1951    Quit date: 12/30/1993  . Smokeless tobacco: Never Used     Comment: quit 20 years ago  .  Alcohol use No  . Drug use: No  . Sexual activity: Not on file   Other Topics Concern  . Not on file   Social History Narrative  . No narrative on file     Review of Systems: General: negative for chills, fever, night sweats or weight changes.  Cardiovascular: negative for chest pain, dyspnea on exertion, edema, orthopnea, palpitations, paroxysmal nocturnal dyspnea or shortness of breath Dermatological: negative for rash Respiratory: negative for cough or wheezing Urologic: negative for hematuria Abdominal: negative for nausea, vomiting, diarrhea, bright red blood per rectum, melena, or hematemesis Neurologic: negative for visual changes, syncope, or dizziness All other systems reviewed and are otherwise  negative except as noted above.    Blood pressure 124/64, pulse (!) 115, height 5\' 9"  (1.753 m), weight 185 lb (83.9 kg).  General appearance: alert, cooperative and no distress Lungs: clear to auscultation bilaterally Heart: irregularly irregular rhythm Skin: Skin color, texture, turgor normal. No rashes or lesions Neurologic: Grossly normal  EKG A-flutter vs sinus tach- rate 120.   ASSESSMENT AND PLAN:   Atrial flutter VR 115. He is tolerating this well  Pacemaker- MDT 02/04/14 MDT pacemaker implanted 2015  Orthostatic hypotension On midodrine  S/P CABG x 4 x4, LIMA to LAD, vein to distal circumflex, sequential vein to intermediate and obtuse marginal vessel (2006) Myoview Sept 2017- low risk  COPD (chronic obstructive pulmonary disease) Followed by Dr Lamonte Sakai- this has been stable  Monoclonal gammopathy of unknown significance (MGUS) Dr Marin Olp follows   PLAN  I suggested they start the Lanoxin as ordered. He will continue Lopressor 75 mg BID. I suggested he return in two weeks on a day that Dr Sallyanne Kuster is in the office. I was hesitant to increase his lopressor secondary to his history of orthostatic B/P. I wonder if Amiodarone would be an option for rate control if Lanoxin is ineffective- will defer to Dr Sallyanne Kuster.   [name] PA-C 01/09/2017 3:37 PM

## 2017-01-22 NOTE — Progress Notes (Signed)
Cardiology Office Note    Date:  01/23/2017   ID:  [name], DOB 11-Oct-1929, MRN [id number]  PCP:  Leonides Sake, MD  Cardiologist: Dr. Claiborne Billings Electrophysiology: Dr. Sallyanne Kuster  Chief Complaint  Patient presents with  . Follow-up    History of Present Illness:    [name] is a 83 y.o. male with past medical history of CAD (s/p CABG in 2006 with LIMA-LAD, SVG-Cx, SVG-IM-OM, low-risk NST in 08/2016), SSS (s/p PPM placement 2015), PAF (on Eliquis), orthostatic hypotension, and COPD who presents to the office today for follow-up.   Was last seen by [name], PA-C on 01/09/2017 for hospital follow-up due to recent Klebsiella Urosepsis. HR was at 115-120 at the time of his visit and he was asymptomatic with this. He was continued on Lopressor 75mg  BID and instructed to start Digoxin as previously prescribed during hospital admission.   In talking with the patient today, he reports doing well since his last office visit. He denies any chest discomfort, palpitations, or worsening dyspnea with exertion. He goes bowling on Mondays, plays chair volleyball on Tuesdays, and goes to the flea market several times per week.   His daughter is present and she helps manage his medications. Denies missing any doses of Eliquis, Digoxin, or Lopressor. No epistaxis, hematochezia, or melena.    Past Medical History:  Diagnosis Date  . Anemia of renal disease 12/05/2011  . Anemia, iron deficiency 12/05/2011  . Asthma   . CAS (cerebral atherosclerosis)    Carotid Dopplers, 04/09/2012 - Bilateral Bulb/Proximal ICAs-mild to moderate amount of fibrous plaque of fibrous soft plaque w/o evidence of a significant diameter reduction, dissection, or any other vascular abnormality  . COPD (chronic obstructive pulmonary disease) ([place])   . Coronary heart disease    a. s/p CABG in 2006 with LIMA-LAD, SVG-Cx, SVG-IM-OM b. low-risk NST in 08/2016  . Dysrhythmia 11/09/2013   SVT VS A FLUTTER   .  Kidney stones   . MGUS (monoclonal gammopathy of unknown significance) 12/05/2011  . Orthostatic hypotension 09/10/2016  . PAF (paroxysmal atrial fibrillation) (H[facility])    a. on Eliquis  . Shortness of breath   . SSS (sick sinus syndrome) ([place])    a. s/p PPM placement in 2015    Past Surgical History:  Procedure Laterality Date  . CARDIAC CATHETERIZATION  12/05/2005   Recommended CABG  . CARDIOVERSION N/A 11/09/2013   Procedure: CARDIOVERSION;  Surgeon: Pixie Casino, MD;  Location: [facility] ENDOSCOPY;  Service: Cardiovascular;  Laterality: N/A;  . CATARACT EXTRACTION    . CORONARY ARTERY BYPASS GRAFT  12/06/2005   x4, LIMA to LAD, vein to distal circumflex, sequential vein to intermediate and obtuse marginal vessel  . INNER EAR SURGERY    . KIDNEY STONE SURGERY    . LOOP RECORDER IMPLANT N/A 11/30/2013   Procedure: LOOP RECORDER IMPLANT;  Surgeon: Sanda Klein, MD;  Location: [place] [facility] CATH LAB;  Service: Cardiovascular;  Laterality: N/A;  . PERMANENT PACEMAKER INSERTION N/A 02/04/2014   Procedure: PERMANENT PACEMAKER INSERTION;  Surgeon: Sanda Klein, MD;  Location: [place] CATH LAB;  Service: Cardiovascular;  Laterality: N/A;  . TEE WITHOUT CARDIOVERSION N/A 11/09/2013   Procedure: TRANSESOPHAGEAL ECHOCARDIOGRAM (TEE);  Surgeon: Pixie Casino, MD;  Location: [facility] ENDOSCOPY;  Service: Cardiovascular;  Laterality: N/A;    Current Medications: Outpatient Medications Prior to Visit  Medication Sig Dispense Refill  . acetaminophen (TYLENOL) 500 MG tablet Take 1,000 mg by mouth every 6 (six) hours as needed.    Marland Kitchen  albuterol (PROVENTIL HFA;VENTOLIN HFA) 108 (90 Base) MCG/ACT inhaler Inhale 2 puffs into the lungs every 4 (four) hours as needed for wheezing or shortness of breath. 3 Inhaler 0  . apixaban (ELIQUIS) 5 MG TABS tablet Take 1 tablet (5 mg total) by mouth every 12 (twelve) hours. 180 tablet 2  . budesonide-formoterol (SYMBICORT) 80-4.5 MCG/ACT inhaler INHALE 2 PUFFS INTO THE LUNGS 2 (TWO) TIMES  DAILY. 3 Inhaler 0  . digoxin (LANOXIN) 0.125 MG tablet Take 1 tablet (0.125 mg total) by mouth daily. 90 tablet 3  . diphenhydramine-acetaminophen (TYLENOL PM) 25-500 MG TABS Take 2 tablets by mouth at bedtime.     . ENSURE (ENSURE) Take 1 Can by mouth every morning.    . metoprolol (LOPRESSOR) 50 MG tablet Take 50 mg by mouth as directed. 1 ANE 1/2 TABLET BY MOUTH TWICE A DAY    . midodrine (PROAMATINE) 5 MG tablet TAKE 1 TABLET (5 MG TOTAL) BY MOUTH 3 (THREE) TIMES DAILY. 270 tablet 4  . niacin (NIASPAN) 500 MG CR tablet TAKE 1 TABLET (500 MG TOTAL) BY MOUTH AT BEDTIME. 90 tablet 3  . Probiotic Product ([employer] SENIOR PROBIOTIC PO) Take 1 capsule by mouth at bedtime. Once daily    . Tamsulosin HCl (FLOMAX) 0.4 MG CAPS Take 0.4 mg by mouth at bedtime.     Marland Kitchen tiotropium (SPIRIVA HANDIHALER) 18 MCG inhalation capsule PLACE 1 CAPSULE (18 MCG TOTAL) INTO INHALER AND INHALE EVERY MORNING. 90 capsule 0   No facility-administered medications prior to visit.      Allergies:   Patient has no known allergies.   Social History   Social History  . Marital status: Married    Spouse name: N/A  . Number of children: N/A  . Years of education: N/A   Occupational History  . retired long distance Administrator   . maintenence    Social History Main Topics  . Smoking status: Former Smoker    Packs/day: 1.00    Years: 47.00    Types: Cigarettes    Start date: 12/02/1951    Quit date: 12/30/1993  . Smokeless tobacco: Never Used     Comment: quit 20 years ago  . Alcohol use No  . Drug use: No  . Sexual activity: Not Asked   Other Topics Concern  . None   Social History Narrative  . None     Family History:  The patient's family history includes Emphysema in his daughter; Heart attack in his father and mother; Heart disease in his brother and mother; Tuberculosis in his father.   Review of Systems:   Please see the history of present illness.     General:  No chills, fever, night sweats or  weight changes.  Cardiovascular:  No chest pain, dyspnea on exertion, edema, orthopnea, palpitations, paroxysmal nocturnal dyspnea. Dermatological: No rash, lesions/masses Respiratory: No cough, dyspnea Urologic: No hematuria, dysuria Abdominal:   No nausea, vomiting, diarrhea, bright red blood per rectum, melena, or hematemesis Neurologic:  No visual changes, wkns, changes in mental status. Positive for occasional dizziness.  All other systems reviewed and are otherwise negative except as noted above.   Physical Exam:    VS:  BP 110/64 (BP Location: Left Arm, Patient Position: Sitting, Cuff Size: Normal)   Pulse 80   Ht 5\' 9"  (1.753 m)   Wt 186 lb (84.4 kg)   BMI 27.47 kg/m    General: Well developed, well nourished elderly Caucasian male appearing in no acute distress. Head:  Normocephalic, atraumatic, sclera non-icteric, no xanthomas, nares are without discharge.  Neck: No carotid bruits. JVD not elevated.  Lungs: Respirations regular and unlabored, without wheezes or rales.  Heart: Irregularly irregular. No S3 or S4.  No murmur, no rubs, or gallops appreciated. Abdomen: Soft, non-tender, non-distended with normoactive bowel sounds. No hepatomegaly. No rebound/guarding. No obvious abdominal masses. Msk:  Strength and tone appear normal for age. No joint deformities or effusions. Extremities: No clubbing or cyanosis. No edema.  Distal pedal pulses are 2+ bilaterally. Neuro: Alert and oriented X 3. Moves all extremities spontaneously. No focal deficits noted. Psych:  Responds to questions appropriately with a normal affect. Skin: No rashes or lesions noted  Wt Readings from Last 3 Encounters:  01/23/17 186 lb (84.4 kg)  01/09/17 185 lb (83.9 kg)  01/07/17 185 lb (83.9 kg)    Studies/Labs Reviewed:   EKG:  EKG is not ordered today.    Recent Labs: 12/06/2016: ALT 129 12/09/2016: BUN 13; Creatinine, Ser 1.10; Hemoglobin 9.8; Platelets 134; Potassium 4.1; Sodium 139   Lipid  Panel    Component Value Date/Time   CHOL 99 (L) 08/15/2015 1151   TRIG 143 08/15/2015 1151   HDL 30 (L) 08/15/2015 1151   CHOLHDL 3.3 08/15/2015 1151   VLDL 29 08/15/2015 1151   LDLCALC 40 08/15/2015 1151    Additional studies/ records that were reviewed today include:   Echocardiogram: 11/2016 Study Conclusions  - Left ventricle: The cavity size was normal. There was mild focal   basal hypertrophy of the septum. Systolic function was normal.   The estimated ejection fraction was in the range of 50% to 55%.   Wall motion was normal; there were no regional wall motion   abnormalities. Left ventricular diastolic function parameters   were normal. - Aortic valve: Trileaflet; mildly thickened, mildly calcified   leaflets. - Aorta: Aortic root dimension: 39 mm (ED). - Aortic root: The aortic root was mildly dilated. - Mitral valve: Minimal focal calcification, with moderate   involvement of chords. There was mild regurgitation. - Right ventricle: The cavity size was mildly dilated. Wall   thickness was normal. Systolic function was moderately reduced. - Right atrium: The atrium was massively dilated. - Tricuspid valve: There was moderate regurgitation. - Pulmonary arteries: PA peak pressure: 45 mm Hg (S).  Impressions:  - The right ventricular systolic pressure was increased consistent   with moderate pulmonary hypertension.  NST: 08/2016  Nuclear stress EF: 43%.  The left ventricular ejection fraction is moderately decreased (30-44%).  There was no ST segment deviation noted during stress.  This is a low risk study.   1. EF 43% with septal hypokinesis.  2. Normal rest and stress perfusion images, so no indication for ischemia or infarction.  3. Low risk study.   Possible nonischemic cardiomyopathy.  Would confirm with echo.   Assessment:    1. Paroxysmal atrial flutter ([place])   2. Chronic anticoagulation   3. Coronary artery disease involving native coronary  artery of native heart without angina pectoris   4. Pacemaker- MDT 02/04/14   5. Orthostatic hypotension      Plan:   In order of problems listed above:  1. Paroxysmal Atrial Flutter/ Chronic Anticoagulation - rhythm remains irregular on examination but HR is improved in the 80's-90's since starting Digoxin 0.125 in addition to previously prescribed Lopressor 75mg  BID. With known orthostatic hypotension, will not increase Lopressor at this time with well-controlled HR.  - he denies any chest discomfort, palpitations,  or worsening dyspnea with exertion. - This patients CHA2DS2-VASc Score and unadjusted Ischemic Stroke Rate (% per year) is equal to 3.2 % stroke rate/year from a score of 3 (Vascular, Age (2)). Continue Eliquis 5mg  BID for anticoagulation. Denies any evidence of active bleeding.   2. CAD - s/p CABG in 2006 with LIMA-LAD, SVG-Cx, SVG-IM-OM with low-risk NST in 08/2016. - he denies any recent anginal symptoms.  - continue BB and statin therapy. No ASA secondary to need for Eliquis.   3. Sick Sinus Syndrome - s/p PPM placement in 2015.  4. Orthostatic Hypotension - reports occasional episodes of dizziness.  - continue Lopressor at current dosing. Will not increase at this time.    Medication Adjustments/Labs and Tests Ordered: Current medicines are reviewed at length with the patient today.  Concerns regarding medicines are outlined above.  Medication changes, Labs and Tests ordered today are listed in the Patient Instructions below. Patient Instructions  Medication Instructions:  NO CHANGES  If you need a refill on your cardiac medications before your next appointment, please call your pharmacy.  Follow-Up: Your physician wants you to follow-up in: [place] will receive a reminder letter in the mail two months in advance. If you don't receive a letter, please call our office (END OF April) to schedule the follow-up appointment.  Arna Medici, [place]  01/23/2017 6:49 PM    [facility] [place], [place], [place]  [address] Phone: [phone]; Fax: [phone]  [address] [place], [place] [address] Phone: [phone]

## 2017-01-23 ENCOUNTER — Ambulatory Visit (INDEPENDENT_AMBULATORY_CARE_PROVIDER_SITE_OTHER): Payer: Medicare Other | Admitting: Student

## 2017-01-23 ENCOUNTER — Encounter: Payer: Self-pay | Admitting: Student

## 2017-01-23 VITALS — BP 110/64 | HR 80 | Ht 69.0 in | Wt 186.0 lb

## 2017-01-23 DIAGNOSIS — Z950 Presence of cardiac pacemaker: Secondary | ICD-10-CM | POA: Diagnosis not present

## 2017-01-23 DIAGNOSIS — I4892 Unspecified atrial flutter: Secondary | ICD-10-CM

## 2017-01-23 DIAGNOSIS — I951 Orthostatic hypotension: Secondary | ICD-10-CM

## 2017-01-23 DIAGNOSIS — I2510 Atherosclerotic heart disease of native coronary artery without angina pectoris: Secondary | ICD-10-CM

## 2017-01-23 DIAGNOSIS — Z7901 Long term (current) use of anticoagulants: Secondary | ICD-10-CM | POA: Diagnosis not present

## 2017-01-23 NOTE — Patient Instructions (Signed)
Medication Instructions:  NO CHANGES  If you need a refill on your cardiac medications before your next appointment, please call your pharmacy.  Follow-Up: Your physician wants you to follow-up in: [place] will receive a reminder letter in the mail two months in advance. If you don't receive a letter, please call our office (END OF April) to schedule the follow-up appointment.  Thank you for choosing [facility] at [employer], LPN Bernerd Pho, PA-C

## 2017-02-03 ENCOUNTER — Ambulatory Visit: Payer: Medicare Other | Admitting: Gastroenterology

## 2017-02-05 DIAGNOSIS — H903 Sensorineural hearing loss, bilateral: Secondary | ICD-10-CM | POA: Diagnosis not present

## 2017-02-05 DIAGNOSIS — H6122 Impacted cerumen, left ear: Secondary | ICD-10-CM | POA: Diagnosis not present

## 2017-02-13 ENCOUNTER — Ambulatory Visit (INDEPENDENT_AMBULATORY_CARE_PROVIDER_SITE_OTHER): Payer: Medicare Other | Admitting: Gastroenterology

## 2017-02-13 ENCOUNTER — Encounter: Payer: Self-pay | Admitting: Gastroenterology

## 2017-02-13 ENCOUNTER — Encounter (INDEPENDENT_AMBULATORY_CARE_PROVIDER_SITE_OTHER): Payer: Self-pay

## 2017-02-13 ENCOUNTER — Other Ambulatory Visit (INDEPENDENT_AMBULATORY_CARE_PROVIDER_SITE_OTHER): Payer: Medicare Other

## 2017-02-13 VITALS — BP 120/72 | HR 80 | Ht 69.0 in | Wt 186.4 lb

## 2017-02-13 DIAGNOSIS — R7989 Other specified abnormal findings of blood chemistry: Secondary | ICD-10-CM

## 2017-02-13 DIAGNOSIS — I2510 Atherosclerotic heart disease of native coronary artery without angina pectoris: Secondary | ICD-10-CM | POA: Diagnosis not present

## 2017-02-13 DIAGNOSIS — R945 Abnormal results of liver function studies: Principal | ICD-10-CM

## 2017-02-13 DIAGNOSIS — K8020 Calculus of gallbladder without cholecystitis without obstruction: Secondary | ICD-10-CM | POA: Diagnosis not present

## 2017-02-13 LAB — HEPATIC FUNCTION PANEL
ALT: 33 U/L (ref 0–53)
AST: 34 U/L (ref 0–37)
Albumin: 4 g/dL (ref 3.5–5.2)
Alkaline Phosphatase: 93 U/L (ref 39–117)
Bilirubin, Direct: 0.2 mg/dL (ref 0.0–0.3)
TOTAL PROTEIN: 7.2 g/dL (ref 6.0–8.3)
Total Bilirubin: 1 mg/dL (ref 0.2–1.2)

## 2017-02-13 NOTE — Patient Instructions (Signed)
Your physician has requested that you go to the basement for the following lab work before leaving today: Hepatic function panel.  Thank you for choosing me and [facility].  Pricilla Riffle. Dagoberto Ligas., MD., Marval Regal

## 2017-02-13 NOTE — Progress Notes (Signed)
History of Present Illness: This is an 83 year old male referred by Lance Hummer, MD for the evaluation of abnormal LFTs. Patient is accompanied by his daughter. He was hospitalized in December for sepsis due to Klebsiella UTI. LFTs were elevated felt to be secondary to shock liver and the trend was steadily improving. LFT's have not been repeated since discharge. He has not seen his PCP since discharge. Abdominal ultrasound below. Patient denies any prior history of liver disease. He has no digestive complaints. Denies weight loss, abdominal pain, constipation, diarrhea, change in stool caliber, melena, hematochezia, nausea, vomiting, dysphagia, reflux symptoms, chest pain.   abd [place] 12/06/2016 IMPRESSION: 1. Gallbladder sludge and stones without findings of acute cholecystitis. 2. No biliary dilatation. 3. Small volume right upper quadrant ascites, new from CT 4 days prior. 4. Bilateral renal cysts and stones. Findings consistent chronic medical renal disease. No hydronephrosis.  No Known Allergies Outpatient Medications Prior to Visit  Medication Sig Dispense Refill  . acetaminophen (TYLENOL) 500 MG tablet Take 1,000 mg by mouth every 6 (six) hours as needed.    Marland Kitchen albuterol (PROVENTIL HFA;VENTOLIN HFA) 108 (90 Base) MCG/ACT inhaler Inhale 2 puffs into the lungs every 4 (four) hours as needed for wheezing or shortness of breath. 3 Inhaler 0  . apixaban (ELIQUIS) 5 MG TABS tablet Take 1 tablet (5 mg total) by mouth every 12 (twelve) hours. 180 tablet 2  . atorvastatin (LIPITOR) 40 MG tablet Take 40 mg by mouth daily.    . budesonide-formoterol (SYMBICORT) 80-4.5 MCG/ACT inhaler INHALE 2 PUFFS INTO THE LUNGS 2 (TWO) TIMES DAILY. 3 Inhaler 0  . digoxin (LANOXIN) 0.125 MG tablet Take 1 tablet (0.125 mg total) by mouth daily. 90 tablet 3  . diphenhydramine-acetaminophen (TYLENOL PM) 25-500 MG TABS Take 2 tablets by mouth at bedtime.     . ENSURE (ENSURE) Take 1 Can by mouth every morning.      . metoprolol (LOPRESSOR) 50 MG tablet Take 50 mg by mouth as directed. 1 ANE 1/2 TABLET BY MOUTH TWICE A DAY    . midodrine (PROAMATINE) 5 MG tablet TAKE 1 TABLET (5 MG TOTAL) BY MOUTH 3 (THREE) TIMES DAILY. 270 tablet 4  . niacin (NIASPAN) 500 MG CR tablet TAKE 1 TABLET (500 MG TOTAL) BY MOUTH AT BEDTIME. 90 tablet 3  . Probiotic Product ([employer] SENIOR PROBIOTIC PO) Take 1 capsule by mouth at bedtime. Once daily    . Tamsulosin HCl (FLOMAX) 0.4 MG CAPS Take 0.4 mg by mouth at bedtime.     Marland Kitchen tiotropium (SPIRIVA HANDIHALER) 18 MCG inhalation capsule PLACE 1 CAPSULE (18 MCG TOTAL) INTO INHALER AND INHALE EVERY MORNING. 90 capsule 0   No facility-administered medications prior to visit.    Past Medical History:  Diagnosis Date  . Anemia of renal disease 12/05/2011  . Anemia, iron deficiency 12/05/2011  . Asthma   . CAS (cerebral atherosclerosis)    Carotid Dopplers, 04/09/2012 - Bilateral Bulb/Proximal ICAs-mild to moderate amount of fibrous plaque of fibrous soft plaque w/o evidence of a significant diameter reduction, dissection, or any other vascular abnormality  . COPD (chronic obstructive pulmonary disease) ([place])   . Coronary heart disease    a. s/p CABG in 2006 with LIMA-LAD, SVG-Cx, SVG-IM-OM b. low-risk NST in 08/2016  . Dysrhythmia 11/09/2013   SVT VS A FLUTTER   . Kidney stones   . MGUS (monoclonal gammopathy of unknown significance) 12/05/2011  . Orthostatic hypotension 09/10/2016  . PAF (paroxysmal atrial fibrillation) ([place])  a. on Eliquis  . Shortness of breath   . SSS (sick sinus syndrome) ([place])    a. s/p PPM placement in 2015   Past Surgical History:  Procedure Laterality Date  . CARDIAC CATHETERIZATION  12/05/2005   Recommended CABG  . CARDIOVERSION N/A 11/09/2013   Procedure: CARDIOVERSION;  Surgeon: Pixie Casino, MD;  Location: [facility] ENDOSCOPY;  Service: Cardiovascular;  Laterality: N/A;  . CATARACT EXTRACTION    . CORONARY ARTERY BYPASS GRAFT  12/06/2005   x4, LIMA to  LAD, vein to distal circumflex, sequential vein to intermediate and obtuse marginal vessel  . INNER EAR SURGERY    . KIDNEY STONE SURGERY    . LOOP RECORDER IMPLANT N/A 11/30/2013   Procedure: LOOP RECORDER IMPLANT;  Surgeon: Sanda Klein, MD;  Location: [place] CATH LAB;  Service: Cardiovascular;  Laterality: N/A;  . PERMANENT PACEMAKER INSERTION N/A 02/04/2014   Procedure: PERMANENT PACEMAKER INSERTION;  Surgeon: Sanda Klein, MD;  Location: [place] CATH LAB;  Service: Cardiovascular;  Laterality: N/A;  . TEE WITHOUT CARDIOVERSION N/A 11/09/2013   Procedure: TRANSESOPHAGEAL ECHOCARDIOGRAM (TEE);  Surgeon: Pixie Casino, MD;  Location: [facility] ENDOSCOPY;  Service: Cardiovascular;  Laterality: N/A;   Social History   Social History  . Marital status: Married    Spouse name: N/A  . Number of children: N/A  . Years of education: N/A   Occupational History  . retired long distance Administrator   . maintenence    Social History Main Topics  . Smoking status: Former Smoker    Packs/day: 1.00    Years: 47.00    Types: Cigarettes    Start date: 12/02/1951    Quit date: 12/30/1993  . Smokeless tobacco: Never Used     Comment: quit 20 years ago  . Alcohol use No  . Drug use: No  . Sexual activity: Not Asked   Other Topics Concern  . None   Social History Narrative  . None   Family History  Problem Relation Age of Onset  . Tuberculosis Father   . Heart attack Father   . Heart disease Mother   . Heart attack Mother   . Heart disease Brother   . Emphysema Daughter     premature without tobacco exposure       Review of Systems: Pertinent positive and negative review of systems were noted in the above HPI section. All other review of systems were otherwise negative.   Physical Exam: General: Well developed, well nourished, no acute distress Head: Normocephalic and atraumatic Eyes:  sclerae anicteric, EOMI Ears: decreased auditory acuity Mouth: No deformity or lesions Neck: Supple,  no masses or thyromegaly Lungs: Clear throughout to auscultation Heart: Regular rate and rhythm; no murmurs, rubs or bruits Abdomen: Soft, non tender and non distended. No masses, hepatosplenomegaly or hernias noted. Normal Bowel sounds Musculoskeletal: Symmetrical with no gross deformities  Skin: No lesions on visible extremities Pulses:  Normal pulses noted Extremities: No clubbing, cyanosis, edema or deformities noted Neurological: Alert oriented x 4, grossly nonfocal Cervical Nodes:  No significant cervical adenopathy Inguinal Nodes: No significant inguinal adenopathy Psychological:  Alert and cooperative. Normal mood and affect  Assessment and Recommendations:  1. Elevated LFTs secondary to shock liver during hospitalization in December. Repeat LFTs today and if the trend is improving will continue to trend LFTs. If they continue to remain elevated for several months or the trend is not improving further evaluation would be indicated.  2. Asymptomatic cholelithiasis.  3. MGUS and anemia  of chronic disease followed by Hematology.   4. S/P pacemaker placement, history of aflutter maintained on Eliquis.    cc: Lance Hummer, MD

## 2017-02-18 DIAGNOSIS — M1712 Unilateral primary osteoarthritis, left knee: Secondary | ICD-10-CM | POA: Diagnosis not present

## 2017-02-20 DIAGNOSIS — H353211 Exudative age-related macular degeneration, right eye, with active choroidal neovascularization: Secondary | ICD-10-CM | POA: Diagnosis not present

## 2017-02-21 DIAGNOSIS — H6122 Impacted cerumen, left ear: Secondary | ICD-10-CM | POA: Diagnosis not present

## 2017-02-21 DIAGNOSIS — H903 Sensorineural hearing loss, bilateral: Secondary | ICD-10-CM | POA: Diagnosis not present

## 2017-03-11 ENCOUNTER — Ambulatory Visit (INDEPENDENT_AMBULATORY_CARE_PROVIDER_SITE_OTHER): Payer: Medicare Other | Admitting: *Deleted

## 2017-03-11 DIAGNOSIS — I4430 Unspecified atrioventricular block: Secondary | ICD-10-CM | POA: Diagnosis not present

## 2017-03-11 NOTE — Progress Notes (Signed)
Remote pacemaker transmission.   

## 2017-03-12 ENCOUNTER — Encounter: Payer: Self-pay | Admitting: Cardiology

## 2017-03-12 LAB — CUP PACEART REMOTE DEVICE CHECK
Battery Remaining Longevity: 138 mo
Brady Statistic AP VP Percent: 0 %
Brady Statistic AP VS Percent: 1 %
Brady Statistic AS VP Percent: 1 %
Date Time Interrogation Session: 20180313140044
Implantable Lead Implant Date: 20150206
Implantable Lead Location: 753860
Implantable Pulse Generator Implant Date: 20150206
Lead Channel Impedance Value: 515 Ohm
Lead Channel Pacing Threshold Amplitude: 0.375 V
Lead Channel Pacing Threshold Amplitude: 0.875 V
Lead Channel Pacing Threshold Pulse Width: 0.4 ms
Lead Channel Sensing Intrinsic Amplitude: 8 mV
Lead Channel Setting Pacing Amplitude: 2.5 V
Lead Channel Setting Sensing Sensitivity: 4 mV
MDC IDC LEAD IMPLANT DT: 20150206
MDC IDC LEAD LOCATION: 753859
MDC IDC MSMT BATTERY IMPEDANCE: 158 Ohm
MDC IDC MSMT BATTERY VOLTAGE: 2.79 V
MDC IDC MSMT LEADCHNL RA IMPEDANCE VALUE: 438 Ohm
MDC IDC MSMT LEADCHNL RA SENSING INTR AMPL: 2.8 mV
MDC IDC MSMT LEADCHNL RV PACING THRESHOLD PULSEWIDTH: 0.4 ms
MDC IDC SET LEADCHNL RA PACING AMPLITUDE: 2 V
MDC IDC SET LEADCHNL RV PACING PULSEWIDTH: 0.4 ms
MDC IDC STAT BRADY AS VS PERCENT: 98 %

## 2017-03-18 ENCOUNTER — Other Ambulatory Visit: Payer: Self-pay | Admitting: Emergency Medicine

## 2017-03-18 DIAGNOSIS — M1712 Unilateral primary osteoarthritis, left knee: Secondary | ICD-10-CM | POA: Diagnosis not present

## 2017-03-25 DIAGNOSIS — H903 Sensorineural hearing loss, bilateral: Secondary | ICD-10-CM | POA: Diagnosis not present

## 2017-03-25 DIAGNOSIS — H6122 Impacted cerumen, left ear: Secondary | ICD-10-CM | POA: Diagnosis not present

## 2017-04-07 ENCOUNTER — Telehealth: Payer: Self-pay | Admitting: Emergency Medicine

## 2017-04-07 NOTE — Telephone Encounter (Signed)
Dr. Lamonte Sakai  Please Advise-  Joann(pt's daughter) called in stating she thought you stated that you wanted pt to use up his Spiriva and then you would change that inhaler to something else. Spoke with daughter and informed her of what your wrote on pt's avs and she states she wants to clarify with you that you were switching both inhalers to [employer]. States pt is almost out of his Spiriva.    Patient Instructions by Collene Gobble, MD at 01/07/2017 3:00 PM   Author: Collene Gobble, MD Author Type: Physician Filed: 01/07/2017 3:36 PM  Note Status: Signed Cosign: Cosign Not Required Encounter Date: 01/07/2017  Editor: Collene Gobble, MD (Physician)    Please continue your Spiriva and Symbicort until your most recent prescriptions are running out.  At that time we will try changing these two medications to an alternative. We will start samples of Stiolto 2 puffs once a day for 1 month. If you prefer the Stiolto then we will write a script for this to your pharmacy.  Take albuterol 2 puffs up to every 4 hours if needed for shortness of breath. You may want to pretreat exertion. Follow with Dr Lamonte Sakai in 12 months or sooner if you have any problems

## 2017-04-08 DIAGNOSIS — M1712 Unilateral primary osteoarthritis, left knee: Secondary | ICD-10-CM | POA: Diagnosis not present

## 2017-04-08 MED ORDER — TIOTROPIUM BROMIDE-OLODATEROL 2.5-2.5 MCG/ACT IN AERS: 2.0000 | INHALATION_SPRAY | Freq: Every day | RESPIRATORY_TRACT | 0 refills | Status: DC

## 2017-04-08 NOTE — Telephone Encounter (Signed)
Yes, I would like to teach him Stiolto, see if he benefits / prefers this. Give him samples if possible. Would like for him to do for a month in order to decide.

## 2017-04-08 NOTE — Telephone Encounter (Signed)
lmomtcb x1 

## 2017-04-08 NOTE — Telephone Encounter (Signed)
RB please advise if the stiolto will replace the symbicort and the spiriva or will he continue the stiolto  And the symibicort.  I called and spoke with pts daughter and she wanted to make sure that this is what the pt needs to do.  thanks  No Known Allergies

## 2017-04-08 NOTE — Telephone Encounter (Signed)
Patients daughter [name] [phone]) returned phone call...will be at doctor appointment at 3pm, ok to leave message.Marland KitchenMarland KitchenMearl Latin

## 2017-04-08 NOTE — Telephone Encounter (Signed)
pts daughter came by to pick up the pts samples and is aware of how to instruct the pt to use this med.  She is aware to hold off on the spiriva and symbicort for now and only use the stiolto.  She will call back in a couple of weeks to give an update on how the pt is doing with the new med.

## 2017-04-08 NOTE — Telephone Encounter (Signed)
Stop both Spiriva and Symbicort Start Stiolto 2 puffs daily

## 2017-04-15 DIAGNOSIS — M1712 Unilateral primary osteoarthritis, left knee: Secondary | ICD-10-CM | POA: Diagnosis not present

## 2017-04-17 DIAGNOSIS — H353211 Exudative age-related macular degeneration, right eye, with active choroidal neovascularization: Secondary | ICD-10-CM | POA: Diagnosis not present

## 2017-04-17 DIAGNOSIS — H353132 Nonexudative age-related macular degeneration, bilateral, intermediate dry stage: Secondary | ICD-10-CM | POA: Diagnosis not present

## 2017-04-18 ENCOUNTER — Other Ambulatory Visit: Payer: Self-pay | Admitting: Cardiovascular Disease

## 2017-04-24 DIAGNOSIS — M1712 Unilateral primary osteoarthritis, left knee: Secondary | ICD-10-CM | POA: Diagnosis not present

## 2017-04-29 DIAGNOSIS — N400 Enlarged prostate without lower urinary tract symptoms: Secondary | ICD-10-CM | POA: Diagnosis not present

## 2017-04-29 DIAGNOSIS — N200 Calculus of kidney: Secondary | ICD-10-CM | POA: Diagnosis not present

## 2017-05-14 ENCOUNTER — Other Ambulatory Visit: Payer: Self-pay

## 2017-05-14 ENCOUNTER — Telehealth: Payer: Self-pay | Admitting: Emergency Medicine

## 2017-05-14 MED ORDER — ATORVASTATIN CALCIUM 40 MG PO TABS: 40.0000 mg | ORAL_TABLET | Freq: Every day | ORAL | 0 refills | Status: DC

## 2017-05-15 DIAGNOSIS — H903 Sensorineural hearing loss, bilateral: Secondary | ICD-10-CM | POA: Diagnosis not present

## 2017-05-15 DIAGNOSIS — H6122 Impacted cerumen, left ear: Secondary | ICD-10-CM | POA: Diagnosis not present

## 2017-05-15 DIAGNOSIS — H6042 Cholesteatoma of left external ear: Secondary | ICD-10-CM | POA: Diagnosis not present

## 2017-05-15 MED ORDER — TIOTROPIUM BROMIDE-OLODATEROL 2.5-2.5 MCG/ACT IN AERS: 2.0000 | INHALATION_SPRAY | Freq: Every day | RESPIRATORY_TRACT | 3 refills | Status: DC

## 2017-05-15 NOTE — Telephone Encounter (Signed)
Called and spoke with pts daughter [name] and she is aware of refill of the stiolto that has been sent to the pharmacy. Nothing further is needed.

## 2017-05-22 DIAGNOSIS — R0602 Shortness of breath: Secondary | ICD-10-CM | POA: Diagnosis not present

## 2017-05-22 DIAGNOSIS — R609 Edema, unspecified: Secondary | ICD-10-CM | POA: Diagnosis not present

## 2017-05-22 DIAGNOSIS — N390 Urinary tract infection, site not specified: Secondary | ICD-10-CM | POA: Diagnosis not present

## 2017-05-22 DIAGNOSIS — L309 Dermatitis, unspecified: Secondary | ICD-10-CM | POA: Diagnosis not present

## 2017-05-23 ENCOUNTER — Telehealth: Payer: Self-pay | Admitting: Cardiovascular Disease

## 2017-05-23 DIAGNOSIS — R0602 Shortness of breath: Secondary | ICD-10-CM | POA: Diagnosis not present

## 2017-05-23 DIAGNOSIS — R609 Edema, unspecified: Secondary | ICD-10-CM | POA: Diagnosis not present

## 2017-05-23 NOTE — Telephone Encounter (Signed)
Returned call to daughter [name] She states patient told her that he has had swelling for about 1 week in feet + legs Patient saw his PCP N. Tobie Lords, [place] yesterday  -- PCP ordered labs (BNP) + "water pill"  -- labs to be done today and daughter will pick up diuretic at pharmacy today Patient is having issues w/knee & is seeing Dr. Lyla Glassing on 6/5 to discuss surgery  -- could be that patient is sitting more since knee is hurting and causing swelling Daughter reports BP was 126/40-something yesterday  Advised daughter to have labs sent to our office Advised daughter to have patient avoid salt/sodium Advised to have patient rest with legs elevated Advised to have patient weigh hisself daily  Informed her that PCP has patient on appropriate treatment plan & that I would send to B. Ahmed Prima (who sees patient on 6/5) & Dr. Claiborne Billings as FYI/further advice if any and she would be notified if they recommended any additional testing, etc at this time.

## 2017-05-23 NOTE — Telephone Encounter (Signed)
Agree with your recommendations. Please monitor BP closely as he experienced issues with orthostatic hypotension in the past. Thank you for your help!

## 2017-05-23 NOTE — Telephone Encounter (Signed)
Called [facility] and advised patient monitor home BPs to assess for potential orthostasis. She voiced understanding.

## 2017-05-23 NOTE — Telephone Encounter (Signed)
New message      Pt c/o swelling: STAT is pt has developed SOB within 24 hours  1. How long have you been experiencing swelling? About a week   2. Where is the swelling located? Feet and legs  3.  Are you currently taking a "fluid pill" no  4.  Are you currently SOB? Yes a little , but he has copd  5.  Have you traveled recently? no

## 2017-05-26 NOTE — Telephone Encounter (Signed)
agree

## 2017-05-30 DIAGNOSIS — J9601 Acute respiratory failure with hypoxia: Secondary | ICD-10-CM

## 2017-05-30 HISTORY — DX: Acute respiratory failure with hypoxia: J96.01

## 2017-06-02 NOTE — Progress Notes (Signed)
Cardiology Office Note    Date:  06/03/2017   ID:  [name], DOB 01/28/1929, MRN [id number]  PCP:  Cyndi Bender, PA-C  Cardiologist:  Dr. Claiborne Billings Electrophysiologist: Dr. Sallyanne Kuster   Chief Complaint  Patient presents with  . Follow-up    Worsening Edema    History of Present Illness:    [name] is a 83 y.o. male with past medical history of CAD (s/p CABG in 2006 with LIMA-LAD, SVG-Cx, SVG-IM-OM, low-risk NST in 08/2016), SSS (s/p PPM placement 2015), PAF (on Eliquis), orthostatic hypotension, and COPD who presents to the office today for evaluation of edema.  He was last examined by myself in 12/2016 and reported doing well from a cardiac perspective at that time. He was able to go bowling several days per week along with playing chair volleyball. Last device check was in 02/2017 and showed good battery status with a high burden of atrial fibrillation (> 50%) with mediocre rate control (110 bpm).  He called the office on 05/23/2017 reporting worsening swelling along his lower extremities. Was evaluated by his PCP on 05/22/2017 and reported worsening lower extremity edema with mild dyspnea over the past several days. BNP was checked at that time and it to 268.7 with creatinine stable at 1.25. He was started on Lasix 20mg  daily at that time.   In talking with the patient and his daughter today, he reports overall feeling well since his last office visit. He denies any recent chest discomfort, dyspnea on exertion, orthopnea, PND, or palpitations. He has noticed worsening lower extremity edema which started approximately 3 weeks ago. During this time frame, he was participating in the [employer]. He reports following a low-sodium diet at that time and denies any increased activity during that time-frame. Elevates his legs at home and wears compression stockings most days.  Since starting Lasix, he has noticed improvement in his edema. Reports his left foot has remained  swollen. He denies any recent injuries to the site. Reports having cartilage injections to his left knee which were completed 3 weeks prior.   Past Medical History:  Diagnosis Date  . Anemia of renal disease 12/05/2011  . Anemia, iron deficiency 12/05/2011  . Asthma   . CAS (cerebral atherosclerosis)    Carotid Dopplers, 04/09/2012 - Bilateral Bulb/Proximal ICAs-mild to moderate amount of fibrous plaque of fibrous soft plaque w/o evidence of a significant diameter reduction, dissection, or any other vascular abnormality  . COPD (chronic obstructive pulmonary disease) ([place])   . Coronary heart disease    a. s/p CABG in 2006 with LIMA-LAD, SVG-Cx, SVG-IM-OM b. low-risk NST in 08/2016  . Dysrhythmia 11/09/2013   SVT VS A FLUTTER   . Kidney stones   . MGUS (monoclonal gammopathy of unknown significance) 12/05/2011  . Orthostatic hypotension 09/10/2016  . PAF (paroxysmal atrial fibrillation) (H[facility])    a. on Eliquis  . Shortness of breath   . SSS (sick sinus syndrome) ([place])    a. s/p PPM placement in 2015    Past Surgical History:  Procedure Laterality Date  . CARDIAC CATHETERIZATION  12/05/2005   Recommended CABG  . CARDIOVERSION N/A 11/09/2013   Procedure: CARDIOVERSION;  Surgeon: Pixie Casino, MD;  Location: [facility] ENDOSCOPY;  Service: Cardiovascular;  Laterality: N/A;  . CATARACT EXTRACTION    . CORONARY ARTERY BYPASS GRAFT  12/06/2005   x4, LIMA to LAD, vein to distal circumflex, sequential vein to intermediate and obtuse marginal vessel  . INNER EAR SURGERY    .  KIDNEY STONE SURGERY    . LOOP RECORDER IMPLANT N/A 11/30/2013   Procedure: LOOP RECORDER IMPLANT;  Surgeon: Sanda Klein, MD;  Location: [place] CATH LAB;  Service: Cardiovascular;  Laterality: N/A;  . PERMANENT PACEMAKER INSERTION N/A 02/04/2014   Procedure: PERMANENT PACEMAKER INSERTION;  Surgeon: Sanda Klein, MD;  Location: [place] CATH LAB;  Service: Cardiovascular;  Laterality: N/A;  . TEE WITHOUT CARDIOVERSION N/A 11/09/2013    Procedure: TRANSESOPHAGEAL ECHOCARDIOGRAM (TEE);  Surgeon: Pixie Casino, MD;  Location: [facility] ENDOSCOPY;  Service: Cardiovascular;  Laterality: N/A;    Current Medications: Outpatient Medications Prior to Visit  Medication Sig Dispense Refill  . acetaminophen (TYLENOL) 500 MG tablet Take 1,000 mg by mouth every 6 (six) hours as needed.    Marland Kitchen albuterol (PROVENTIL HFA;VENTOLIN HFA) 108 (90 Base) MCG/ACT inhaler Inhale 2 puffs into the lungs every 4 (four) hours as needed for wheezing or shortness of breath. 3 Inhaler 0  . apixaban (ELIQUIS) 5 MG TABS tablet Take 1 tablet (5 mg total) by mouth every 12 (twelve) hours. 180 tablet 2  . atorvastatin (LIPITOR) 40 MG tablet Take 1 tablet (40 mg total) by mouth daily. PLEASE CONTACT OFFICE FOR ADDITIONAL REFILLS 90 tablet 0  . digoxin (LANOXIN) 0.125 MG tablet Take 1 tablet (0.125 mg total) by mouth daily. 90 tablet 3  . diphenhydramine-acetaminophen (TYLENOL PM) 25-500 MG TABS Take 2 tablets by mouth at bedtime.     . ENSURE (ENSURE) Take 1 Can by mouth every morning.    . metoprolol (LOPRESSOR) 50 MG tablet Take 50 mg by mouth as directed. 1 ANE 1/2 TABLET BY MOUTH TWICE A DAY    . midodrine (PROAMATINE) 5 MG tablet TAKE 1 TABLET (5 MG TOTAL) BY MOUTH 3 (THREE) TIMES DAILY. 270 tablet 4  . niacin (NIASPAN) 500 MG CR tablet TAKE 1 TABLET (500 MG TOTAL) BY MOUTH AT BEDTIME. 90 tablet 0  . Probiotic Product ([employer] SENIOR PROBIOTIC PO) Take 1 capsule by mouth at bedtime. Once daily    . Tamsulosin HCl (FLOMAX) 0.4 MG CAPS Take 0.4 mg by mouth at bedtime.     . Tiotropium Bromide-Olodaterol (STIOLTO RESPIMAT) 2.5-2.5 MCG/ACT AERS Inhale 2 puffs into the lungs daily. 3 Inhaler 3  . budesonide-formoterol (SYMBICORT) 80-4.5 MCG/ACT inhaler INHALE 2 PUFFS INTO THE LUNGS 2 (TWO) TIMES DAILY. 3 Inhaler 0  . SPIRIVA HANDIHALER 18 MCG inhalation capsule PLACE 1 CAPSULE (18 MCG TOTAL) INTO INHALER AND INHALE EVERY MORNING. 30 capsule 2   No facility-administered  medications prior to visit.      Allergies:   Patient has no known allergies.   Social History   Social History  . Marital status: Married    Spouse name: N/A  . Number of children: N/A  . Years of education: N/A   Occupational History  . retired long distance Administrator   . maintenence    Social History Main Topics  . Smoking status: Former Smoker    Packs/day: 1.00    Years: 47.00    Types: Cigarettes    Start date: 12/02/1951    Quit date: 12/30/1993  . Smokeless tobacco: Never Used     Comment: quit 20 years ago  . Alcohol use No  . Drug use: No  . Sexual activity: Not Asked   Other Topics Concern  . None   Social History Narrative  . None     Family History:  The patient's family history includes Emphysema in his daughter; Heart attack in his  father and mother; Heart disease in his brother and mother; Tuberculosis in his father.   Review of Systems:   Please see the history of present illness.     General:  No chills, fever, night sweats or weight changes.  Cardiovascular:  No chest pain, dyspnea on exertion, orthopnea, palpitations, paroxysmal nocturnal dyspnea. Positive for edema.  Dermatological: No rash, lesions/masses Respiratory: No cough, dyspnea Urologic: No hematuria, dysuria Abdominal:   No nausea, vomiting, diarrhea, bright red blood per rectum, melena, or hematemesis Neurologic:  No visual changes, wkns, changes in mental status. All other systems reviewed and are otherwise negative except as noted above.   Physical Exam:    VS:  BP 122/62   Pulse 71   Ht 5\' 9"  (1.753 m)   Wt 185 lb 6.4 oz (84.1 kg)   SpO2 98%   BMI 27.38 kg/m     General: Well developed, well nourished Caucasian male appearing in no acute distress. Head: Normocephalic, atraumatic, sclera non-icteric, no xanthomas, nares are without discharge.  Neck: No carotid bruits. JVD not elevated.  Lungs: Respirations regular and unlabored, without wheezes or rales.  Heart:  Irregularly irregular. No S3 or S4.  No murmur, no rubs, or gallops appreciated. Abdomen: Soft, non-tender, non-distended with normoactive bowel sounds. No hepatomegaly. No rebound/guarding. No obvious abdominal masses. Msk:  Strength and tone appear normal for age. No joint deformities or effusions. Extremities: No clubbing or cyanosis. Trace lower extremity edema.  Distal pedal pulses are 2+ bilaterally. Neuro: Alert and oriented X 3. Moves all extremities spontaneously. No focal deficits noted. Psych:  Responds to questions appropriately with a normal affect. Skin: No rashes or lesions noted  Wt Readings from Last 3 Encounters:  06/03/17 185 lb 6.4 oz (84.1 kg)  02/13/17 186 lb 6.4 oz (84.6 kg)  01/23/17 186 lb (84.4 kg)    Studies/Labs Reviewed:   EKG:  EKG is not ordered today.   Recent Labs: 12/09/2016: BUN 13; Creatinine, Ser 1.10; Hemoglobin 9.8; Platelets 134; Potassium 4.1; Sodium 139 02/13/2017: ALT 33   Lipid Panel    Component Value Date/Time   CHOL 99 (L) 08/15/2015 1151   TRIG 143 08/15/2015 1151   HDL 30 (L) 08/15/2015 1151   CHOLHDL 3.3 08/15/2015 1151   VLDL 29 08/15/2015 1151   LDLCALC 40 08/15/2015 1151    Additional studies/ records that were reviewed today include:   NST: 08/2016  Nuclear stress EF: 43%.  The left ventricular ejection fraction is moderately decreased (30-44%).  There was no ST segment deviation noted during stress.  This is a low risk study.   1. EF 43% with septal hypokinesis.  2. Normal rest and stress perfusion images, so no indication for ischemia or infarction.  3. Low risk study.   Possible nonischemic cardiomyopathy.  Would confirm with echo.   Echocardiogram: 12/05/2016 Study Conclusions  - Left ventricle: The cavity size was normal. There was mild focal   basal hypertrophy of the septum. Systolic function was normal.   The estimated ejection fraction was in the range of 50% to 55%.   Wall motion was normal; there  were no regional wall motion   abnormalities. Left ventricular diastolic function parameters   were normal. - Aortic valve: Trileaflet; mildly thickened, mildly calcified   leaflets. - Aorta: Aortic root dimension: 39 mm (ED). - Aortic root: The aortic root was mildly dilated. - Mitral valve: Minimal focal calcification, with moderate   involvement of chords. There was mild regurgitation. - Right  ventricle: The cavity size was mildly dilated. Wall   thickness was normal. Systolic function was moderately reduced. - Right atrium: The atrium was massively dilated. - Tricuspid valve: There was moderate regurgitation. - Pulmonary arteries: PA peak pressure: 45 mm Hg (S).  Impressions:  - The right ventricular systolic pressure was increased consistent   with moderate pulmonary hypertension.   Assessment:    1. Coronary artery disease involving native coronary artery of native heart without angina pectoris   2. Lower extremity edema   3. Paroxysmal atrial flutter ([place])   4. AVB (atrioventricular block)- high grade second degree   5. Chronic obstructive pulmonary disease, unspecified COPD type ([place])   6. Arthritis of left knee      Plan:   In order of problems listed above:  1. CAD - s/p CABG in 2006 with LIMA-LAD, SVG-Cx, SVG-IM-OM with recent low-risk NST in 08/2016.  - Denies any recent chest discomfort or dyspnea on exertion. - continue BB and statin therapy. No ASA secondary to need for Eliquis.   2. Lower Extremity Edema - he reports worsening edema over the past 3 weeks. Was evaluated by his PCP with BNP mildly elevated to 268 at that time. Started on Lasix 20mg  daily and reports significant improvement in his symptoms. Edema has mostly resolved along the RLE and only trace edema is noted on the LLE. He denies any orthopnea or PND and lungs are clear on examination. - his daughter is concerned about the etiology of his swelling. He had been participating in the [employer] when his edema started and I wonder if there was some dietary indiscretion at that time or perhaps his HR was more elevated with the increased level of activity.  - would continue with Lasix 20mg  daily at this time until edema resolves.   3. Paroxysmal Atrial Fibrillation - HR is stable in the 70's on examination today. Continue Digoxin 0.125mg  daily and Lopressor 75mg  BID for rate-control.  - he denies any evidence of active bleeding. Continue Eliquis for anticoagulation.   4. Heart Block - s/p PPM placement in 2015 - device check in 02/2017 showed good battery status with a high burden of atrial fibrillation (> 50%) with mediocre rate control. - followed by Dr. Sallyanne Kuster. Has a remote transmission scheduled for next week.   5. COPD - followed by Dr. Lamonte Sakai. Recently switched to [facility] and reports improvement in his symptoms.  - continue routine follow-up with Pulmonology.   6. Arthritis of Left Knee - He has been receiving steroid injections and cartilage injections along his left knee with minimal improvement in his symptoms. He is meeting with the Orthopedic surgeon later today to discuss potential surgical options. - His recent stress test as of last year showed no ischemia, overall being low-risk. An echocardiogram showed a preserved ejection fraction. He also denies any recent anginal symptoms. - In the setting of his advanced age and multiple medical issues, he would be of moderate risk from a cardiac standpoint for the procedure. No further testing is indicated at this time. Would obtain a pre-procedure EKG if surgery is selected. Would also need to hold Eliquis for 2-days pre-procedure then resume after surgery.    Medication Adjustments/Labs and Tests Ordered: Current medicines are reviewed at length with the patient today.  Concerns regarding medicines are outlined above.  Medication changes, Labs and Tests ordered today are listed in the Patient Instructions  below. Patient Instructions  Medication Instructions:  CONTINUE WITH LASIX 20MG  DAILY UNTIL  SWELLING IS GONE  If you need a refill on your cardiac medications before your next appointment, please call your pharmacy.  Follow-Up: Your physician wants you to follow-up in: [place]    Thank you for choosing [facility]!!      Signed, Erma Heritage, PA-C  06/03/2017 6:35 PM    [place] [address], [place], [place]  [address] Phone: [phone]; Fax: [phone]  [address] [place], [place] [address] Phone: [phone]

## 2017-06-03 ENCOUNTER — Ambulatory Visit (INDEPENDENT_AMBULATORY_CARE_PROVIDER_SITE_OTHER): Payer: Medicare Other | Admitting: Student

## 2017-06-03 ENCOUNTER — Encounter: Payer: Self-pay | Admitting: Student

## 2017-06-03 ENCOUNTER — Telehealth: Payer: Self-pay | Admitting: Emergency Medicine

## 2017-06-03 VITALS — BP 122/62 | HR 71 | Ht 69.0 in | Wt 185.4 lb

## 2017-06-03 DIAGNOSIS — J449 Chronic obstructive pulmonary disease, unspecified: Secondary | ICD-10-CM | POA: Diagnosis not present

## 2017-06-03 DIAGNOSIS — M25562 Pain in left knee: Secondary | ICD-10-CM | POA: Diagnosis not present

## 2017-06-03 DIAGNOSIS — I4892 Unspecified atrial flutter: Secondary | ICD-10-CM

## 2017-06-03 DIAGNOSIS — M1712 Unilateral primary osteoarthritis, left knee: Secondary | ICD-10-CM | POA: Diagnosis not present

## 2017-06-03 DIAGNOSIS — I2510 Atherosclerotic heart disease of native coronary artery without angina pectoris: Secondary | ICD-10-CM

## 2017-06-03 DIAGNOSIS — R600 Localized edema: Secondary | ICD-10-CM

## 2017-06-03 DIAGNOSIS — I4430 Unspecified atrioventricular block: Secondary | ICD-10-CM | POA: Diagnosis not present

## 2017-06-03 DIAGNOSIS — G8929 Other chronic pain: Secondary | ICD-10-CM | POA: Diagnosis not present

## 2017-06-03 NOTE — Telephone Encounter (Signed)
Spoke with [employer]. Advised her that we have received the papers. Will route to [place] for follow up.

## 2017-06-03 NOTE — Patient Instructions (Signed)
Medication Instructions:  CONTINUE WITH LASIX 20MG  DAILY UNTIL SWELLING IS GONE  If you need a refill on your cardiac medications before your next appointment, please call your pharmacy.  Follow-Up: Your physician wants you to follow-up in: 3 [place]    Thank you for choosing [facility]!!

## 2017-06-05 ENCOUNTER — Other Ambulatory Visit (HOSPITAL_BASED_OUTPATIENT_CLINIC_OR_DEPARTMENT_OTHER): Payer: Medicare Other

## 2017-06-05 ENCOUNTER — Ambulatory Visit (HOSPITAL_BASED_OUTPATIENT_CLINIC_OR_DEPARTMENT_OTHER): Payer: Medicare Other | Admitting: Family

## 2017-06-05 VITALS — BP 140/63 | HR 73 | Temp 98.0°F | Resp 20 | Wt 185.0 lb

## 2017-06-05 DIAGNOSIS — N189 Chronic kidney disease, unspecified: Secondary | ICD-10-CM

## 2017-06-05 DIAGNOSIS — D631 Anemia in chronic kidney disease: Secondary | ICD-10-CM | POA: Diagnosis not present

## 2017-06-05 DIAGNOSIS — I2510 Atherosclerotic heart disease of native coronary artery without angina pectoris: Secondary | ICD-10-CM

## 2017-06-05 DIAGNOSIS — D472 Monoclonal gammopathy: Secondary | ICD-10-CM

## 2017-06-05 DIAGNOSIS — D509 Iron deficiency anemia, unspecified: Secondary | ICD-10-CM | POA: Diagnosis not present

## 2017-06-05 DIAGNOSIS — D508 Other iron deficiency anemias: Secondary | ICD-10-CM | POA: Diagnosis not present

## 2017-06-05 DIAGNOSIS — D500 Iron deficiency anemia secondary to blood loss (chronic): Secondary | ICD-10-CM

## 2017-06-05 LAB — IRON AND TIBC
%SAT: 33 % (ref 20–55)
IRON: 77 ug/dL (ref 42–163)
TIBC: 231 ug/dL (ref 202–409)
UIBC: 154 ug/dL (ref 117–376)

## 2017-06-05 LAB — COMPREHENSIVE METABOLIC PANEL
ALT: 32 U/L (ref 0–55)
ANION GAP: 11 meq/L (ref 3–11)
AST: 33 U/L (ref 5–34)
Albumin: 3.7 g/dL (ref 3.5–5.0)
Alkaline Phosphatase: 77 U/L (ref 40–150)
BUN: 15.6 mg/dL (ref 7.0–26.0)
CALCIUM: 9.6 mg/dL (ref 8.4–10.4)
CO2: 25 mEq/L (ref 22–29)
Chloride: 105 mEq/L (ref 98–109)
Creatinine: 1.3 mg/dL (ref 0.7–1.3)
EGFR: 48 mL/min/{1.73_m2} — ABNORMAL LOW (ref >90)
Glucose: 125 mg/dl (ref 70–140)
POTASSIUM: 3.9 meq/L (ref 3.5–5.1)
Sodium: 141 mEq/L (ref 136–145)
Total Bilirubin: 0.9 mg/dL (ref 0.20–1.20)
Total Protein: 7.3 g/dL (ref 6.4–8.3)

## 2017-06-05 LAB — CBC WITH DIFFERENTIAL (CANCER CENTER ONLY)
BASO#: 0.1 10*3/uL (ref 0.0–0.2)
BASO%: 1.8 % (ref 0.0–2.0)
EOS ABS: 0.3 10*3/uL (ref 0.0–0.5)
EOS%: 6.3 % (ref 0.0–7.0)
HCT: 38.1 % — ABNORMAL LOW (ref 38.7–49.9)
HEMOGLOBIN: 12.7 g/dL — AB (ref 13.0–17.1)
LYMPH#: 1.3 10*3/uL (ref 0.9–3.3)
LYMPH%: 29.9 % (ref 14.0–48.0)
MCH: 33.3 pg (ref 28.0–33.4)
MCHC: 33.3 g/dL (ref 32.0–35.9)
MCV: 100 fL — AB (ref 82–98)
MONO#: 0.4 10*3/uL (ref 0.1–0.9)
MONO%: 9.5 % (ref 0.0–13.0)
NEUT#: 2.3 10*3/uL (ref 1.5–6.5)
NEUT%: 52.5 % (ref 40.0–80.0)
Platelets: 143 10*3/uL — ABNORMAL LOW (ref 145–400)
RBC: 3.81 10*6/uL — AB (ref 4.20–5.70)
RDW: 12 % (ref 11.1–15.7)
WBC: 4.4 10*3/uL (ref 4.0–10.0)

## 2017-06-05 LAB — FERRITIN: FERRITIN: 743 ng/mL — AB (ref 22–316)

## 2017-06-05 NOTE — Telephone Encounter (Signed)
Surgical clearance form has been received. Form is in RB's sign folder. RB is not back in the office until 06/23/17, form will be addressed then.

## 2017-06-05 NOTE — Progress Notes (Signed)
Hematology and Oncology Follow Up Visit  [name] [id number] [birth year] 83 y.o. 06/05/2017   Principle Diagnosis:  1. 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 Anemia secondary to chronic renal insufficiency 3. Intermittent iron - deficiency anemia  Current Therapy:   IV iron as indicated - last received in December 2017   Interim History:  [name] is here today with his daughter for follow-up. 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 He is quite proud of this and enjoys participating.  His Hgb is stable at 12.7 and MCV of 143. Iron studies are pending. His last infusion was in December.  He is planning to have a left knee replacement in the next 2-4 weeks. He did not get any relief from the steroid or gel injections. He is waiting on pulmonary and cardiac clearance.  He is using a cane when ambulating for support. He has had no falls or syncopal episonde He has occasional SOB with over exertion due to emphysema and asthma. He will take breaks to rest as needed.  No fever, chills, n/v, cough, rash, dizziness, chest pain, palpitations, abdominal pain or changes in bowel or bladder habits.  No episodes of bleeding, bruising or petechiae. No lymphadenopathy found on exam.  He has swelling in the left knee, ankle and foot. He is tender in the left knee. No numbness or tingling in her extremities.  He has maintained a good appetite and is staying well hydrated. His weight is stable.   ECOG Performance Status: 1 - Symptomatic but completely ambulatory  Medications:  Allergies as of 06/05/2017   No Known Allergies     Medication List       Accurate as of 06/05/17 12:05 PM. Always use your most recent med list.          acetaminophen 500 MG tablet Commonly known as:  TYLENOL Take 1,000 mg by mouth every 6 (six) hours as needed.   albuterol 108 (90 Base) MCG/ACT inhaler Commonly known as:  PROVENTIL HFA;VENTOLIN HFA Inhale 2 puffs  into the lungs every 4 (four) hours as needed for wheezing or shortness of breath.   apixaban 5 MG Tabs tablet Commonly known as:  ELIQUIS Take 1 tablet (5 mg total) by mouth every 12 (twelve) hours.   atorvastatin 40 MG tablet Commonly known as:  LIPITOR Take 1 tablet (40 mg total) by mouth daily. PLEASE CONTACT OFFICE FOR ADDITIONAL REFILLS   [employer] SENIOR PROBIOTIC PO Take 1 capsule by mouth at bedtime. Once daily   digoxin 0.125 MG tablet Commonly known as:  LANOXIN Take 1 tablet (0.125 mg total) by mouth daily.   diphenhydramine-acetaminophen 25-500 MG Tabs tablet Commonly known as:  TYLENOL PM Take 2 tablets by mouth at bedtime.   ENSURE Take 1 Can by mouth every morning.   furosemide 20 MG tablet Commonly known as:  LASIX Take 20 mg by mouth daily.   metoprolol tartrate 50 MG tablet Commonly known as:  LOPRESSOR Take 50 mg by mouth as directed. 1 ANE 1/2 TABLET BY MOUTH TWICE A DAY   midodrine 5 MG tablet Commonly known as:  PROAMATINE TAKE 1 TABLET (5 MG TOTAL) BY MOUTH 3 (THREE) TIMES DAILY.   niacin 500 MG CR tablet Commonly known as:  NIASPAN TAKE 1 TABLET (500 MG TOTAL) BY MOUTH AT BEDTIME.   tamsulosin 0.4 MG Caps capsule Commonly known as:  FLOMAX Take 0.4 mg by mouth at bedtime.   Tiotropium Bromide-Olodaterol 2.5-2.5 MCG/ACT Aers  Commonly known as:  STIOLTO RESPIMAT Inhale 2 puffs into the lungs daily.       Allergies: No Known Allergies  Past Medical History, Surgical history, Social history, and Family History were reviewed and updated.  Review of Systems: All other 10 point review of systems is negative.   Physical Exam:  weight is 185 lb (83.9 kg). His oral temperature is 98 F (36.7 C). His blood pressure is 140/63 and his pulse is 73. His respiration is 20 and oxygen saturation is 98%.   Wt Readings from Last 3 Encounters:  06/05/17 185 lb (83.9 kg)  06/03/17 185 lb 6.4 oz (84.1 kg)  02/13/17 186 lb 6.4 oz (84.6 kg)    Ocular:  Sclerae unicteric, pupils equal, round and reactive to light Ear-nose-throat: Oropharynx clear, dentition fair Lymphatic: No cervical, supraclavicular or axillary adenopathy Lungs no rales or rhonchi, good excursion bilaterally Heart regular rate and rhythm, no murmur appreciated Abd soft, nontender, positive bowel sounds, no liver or spleen tip palpated on exam, no fluid wave MSK no focal spinal tenderness, no joint edema Neuro: non-focal, well-oriented, appropriate affect Breasts: Deferred   Lab Results  Component Value Date   WBC 4.4 06/05/2017   HGB 12.7 (L) 06/05/2017   HCT 38.1 (L) 06/05/2017   MCV 100 (H) 06/05/2017   PLT 143 (L) 06/05/2017   Lab Results  Component Value Date   FERRITIN 3,420 (H) 12/01/2016   IRON 33 (L) 12/01/2016   TIBC 225 (L) 12/01/2016   UIBC 192 12/01/2016   IRONPCTSAT 15 (L) 12/01/2016   Lab Results  Component Value Date   RETICCTPCT 0.8 11/09/2015   RBC 3.81 (L) 06/05/2017   RETICCTABS 33.2 11/09/2015   Lab Results  Component Value Date   KPAFRELGTCHN 4.45 (H) 11/09/2015   LAMBDASER 9.33 (H) 11/09/2015   KAPLAMBRATIO 0.37 11/07/2016   Lab Results  Component Value Date   IGGSERUM 979 11/07/2016   IGA 181 11/09/2015   IGMSERUM 306 (H) 11/07/2016   Lab Results  Component Value Date   TOTALPROTELP 7.1 11/09/2015   ALBUMINELP 3.8 11/09/2015   A1GS 0.3 11/09/2015   A2GS 0.8 11/09/2015   BETS 0.4 11/09/2015   BETA2SER 0.4 11/09/2015   GAMS 1.3 11/09/2015   MSPIKE 0.6 (H) 11/07/2016   SPEI * 11/09/2015     Chemistry      Component Value Date/Time   NA 139 12/09/2016 0419   NA 142 11/07/2016 1049   K 4.1 12/09/2016 0419   K 4.8 11/07/2016 1049   CL 103 12/09/2016 0419   CO2 26 12/09/2016 0419   CO2 25 11/07/2016 1049   BUN 13 12/09/2016 0419   BUN 19.4 11/07/2016 1049   CREATININE 1.10 12/09/2016 0419   CREATININE 1.2 11/07/2016 1049      Component Value Date/Time   CALCIUM 8.6 (L) 12/09/2016 0419   CALCIUM 9.6 11/07/2016  1049   ALKPHOS 93 02/13/2017 1059   ALKPHOS 92 11/07/2016 1049   AST 34 02/13/2017 1059   AST 27 11/07/2016 1049   ALT 33 02/13/2017 1059   ALT 30 11/07/2016 1049   BILITOT 1.0 02/13/2017 1059   BILITOT 1.30 (H) 11/07/2016 1049      Impression and Plan: [name] is a very pleasant 83 yo caucasian gentleman with both iron deficiency anemia and anemia of chronic renal insufficiency. He also has MGUS which so far has not been an issue for him.  His Hgb is stable at this time. We will see what his  iron studies show and bring him back next week for an infusion if needed. We will make sure his iron counts are ready for surgery.  We will go ahead and plan to see him back in 6 months for repeat labs and follow-up.  Both he and his daughter know to contact our office with any questions or concerns. We can certainly see him sooner if need be.   Eliezer Bottom, NP 6/7/201812:05 PM

## 2017-06-06 ENCOUNTER — Telehealth: Payer: Self-pay | Admitting: *Deleted

## 2017-06-06 LAB — IGG, IGA, IGM
IGA/IMMUNOGLOBULIN A, SERUM: 145 mg/dL (ref 61–437)
IGG (IMMUNOGLOBIN G), SERUM: 984 mg/dL (ref 700–1600)
IgM, Qn, Serum: 338 mg/dL — ABNORMAL HIGH (ref 15–143)

## 2017-06-06 LAB — ERYTHROPOIETIN: ERYTHROPOIETIN: 19.7 m[IU]/mL — AB (ref 2.6–18.5)

## 2017-06-06 LAB — KAPPA/LAMBDA LIGHT CHAINS
Ig Kappa Free Light Chain: 28.7 mg/L — ABNORMAL HIGH (ref 3.3–19.4)
Ig Lambda Free Light Chain: 105.1 mg/L — ABNORMAL HIGH (ref 5.7–26.3)
Kappa/Lambda FluidC Ratio: 0.27 (ref 0.26–1.65)

## 2017-06-06 LAB — RETICULOCYTES: Reticulocyte Count: 1.3 % (ref 0.6–2.6)

## 2017-06-06 NOTE — Telephone Encounter (Signed)
Faxed surgical clearance on 06/03/17 to [facility]. Ok per [place] to have right total knee replacement surgery. Okay to hold Eliquis for 2 days.

## 2017-06-08 LAB — PROTEIN ELECTROPHORESIS, SERUM
A/G Ratio: 1.1 (ref 0.7–1.7)
ALPHA 1: 0.2 g/dL (ref 0.0–0.4)
Albumin: 3.4 g/dL (ref 2.9–4.4)
Alpha 2: 0.8 g/dL (ref 0.4–1.0)
Beta: 0.9 g/dL (ref 0.7–1.3)
GAMMA GLOBULIN: 1.3 g/dL (ref 0.4–1.8)
Globulin, Total: 3.2 g/dL (ref 2.2–3.9)
M-Spike, %: 0.6 g/dL — ABNORMAL HIGH
TOTAL PROTEIN: 6.6 g/dL (ref 6.0–8.5)

## 2017-06-10 ENCOUNTER — Ambulatory Visit (INDEPENDENT_AMBULATORY_CARE_PROVIDER_SITE_OTHER): Payer: Medicare Other | Admitting: *Deleted

## 2017-06-10 ENCOUNTER — Telehealth: Payer: Self-pay | Admitting: Cardiology

## 2017-06-10 DIAGNOSIS — I4430 Unspecified atrioventricular block: Secondary | ICD-10-CM | POA: Diagnosis not present

## 2017-06-10 NOTE — Telephone Encounter (Signed)
LMOVM reminding pt to send remote transmission.   

## 2017-06-11 ENCOUNTER — Encounter: Payer: Self-pay | Admitting: Cardiology

## 2017-06-11 DIAGNOSIS — H903 Sensorineural hearing loss, bilateral: Secondary | ICD-10-CM | POA: Diagnosis not present

## 2017-06-11 DIAGNOSIS — H6122 Impacted cerumen, left ear: Secondary | ICD-10-CM | POA: Diagnosis not present

## 2017-06-11 DIAGNOSIS — H6042 Cholesteatoma of left external ear: Secondary | ICD-10-CM | POA: Diagnosis not present

## 2017-06-11 NOTE — Progress Notes (Signed)
Remote pacemaker transmission.   

## 2017-06-12 LAB — CUP PACEART REMOTE DEVICE CHECK
Battery Impedance: 159 Ohm
Battery Remaining Longevity: 138 mo
Battery Voltage: 2.79 V
Brady Statistic AP VP Percent: 0 %
Brady Statistic AP VS Percent: 9 %
Brady Statistic AS VP Percent: 1 %
Implantable Lead Location: 753860
Implantable Lead Model: 5076
Implantable Lead Model: 5076
Implantable Pulse Generator Implant Date: 20150206
Lead Channel Impedance Value: 463 Ohm
Lead Channel Pacing Threshold Amplitude: 0.75 V
Lead Channel Pacing Threshold Pulse Width: 0.4 ms
Lead Channel Pacing Threshold Pulse Width: 0.4 ms
Lead Channel Setting Pacing Amplitude: 2 V
Lead Channel Setting Pacing Pulse Width: 0.4 ms
Lead Channel Setting Sensing Sensitivity: 4 mV
MDC IDC LEAD IMPLANT DT: 20150206
MDC IDC LEAD IMPLANT DT: 20150206
MDC IDC LEAD LOCATION: 753859
MDC IDC MSMT LEADCHNL RV IMPEDANCE VALUE: 586 Ohm
MDC IDC MSMT LEADCHNL RV PACING THRESHOLD AMPLITUDE: 0.375 V
MDC IDC SESS DTM: 20180612165603
MDC IDC SET LEADCHNL RV PACING AMPLITUDE: 2.5 V
MDC IDC STAT BRADY AS VS PERCENT: 90 %

## 2017-06-15 ENCOUNTER — Inpatient Hospital Stay (HOSPITAL_COMMUNITY): Admit: 2017-06-15 | Payer: Medicare Other

## 2017-06-15 ENCOUNTER — Encounter (HOSPITAL_COMMUNITY): Payer: Self-pay | Admitting: Emergency Medicine

## 2017-06-15 ENCOUNTER — Emergency Department (HOSPITAL_COMMUNITY): Payer: Medicare Other

## 2017-06-15 ENCOUNTER — Inpatient Hospital Stay (HOSPITAL_COMMUNITY)
Admission: EM | Admit: 2017-06-15 | Discharge: 2017-06-17 | DRG: 291 | Disposition: A | Discharge status: Home / Self Care | Payer: Medicare Other | Attending: Oncology | Admitting: Oncology

## 2017-06-15 DIAGNOSIS — I480 Paroxysmal atrial fibrillation: Secondary | ICD-10-CM | POA: Diagnosis present

## 2017-06-15 DIAGNOSIS — I249 Acute ischemic heart disease, unspecified: Secondary | ICD-10-CM | POA: Diagnosis not present

## 2017-06-15 DIAGNOSIS — I130 Hypertensive heart and chronic kidney disease with heart failure and stage 1 through stage 4 chronic kidney disease, or unspecified chronic kidney disease: Principal | ICD-10-CM | POA: Diagnosis present

## 2017-06-15 DIAGNOSIS — Z9849 Cataract extraction status, unspecified eye: Secondary | ICD-10-CM | POA: Diagnosis not present

## 2017-06-15 DIAGNOSIS — D638 Anemia in other chronic diseases classified elsewhere: Secondary | ICD-10-CM | POA: Diagnosis present

## 2017-06-15 DIAGNOSIS — M79609 Pain in unspecified limb: Secondary | ICD-10-CM | POA: Diagnosis not present

## 2017-06-15 DIAGNOSIS — Z87891 Personal history of nicotine dependence: Secondary | ICD-10-CM

## 2017-06-15 DIAGNOSIS — R069 Unspecified abnormalities of breathing: Secondary | ICD-10-CM | POA: Diagnosis not present

## 2017-06-15 DIAGNOSIS — I50813 Acute on chronic right heart failure: Secondary | ICD-10-CM | POA: Diagnosis not present

## 2017-06-15 DIAGNOSIS — Z8249 Family history of ischemic heart disease and other diseases of the circulatory system: Secondary | ICD-10-CM | POA: Diagnosis not present

## 2017-06-15 DIAGNOSIS — M1712 Unilateral primary osteoarthritis, left knee: Secondary | ICD-10-CM | POA: Diagnosis present

## 2017-06-15 DIAGNOSIS — Z950 Presence of cardiac pacemaker: Secondary | ICD-10-CM | POA: Diagnosis present

## 2017-06-15 DIAGNOSIS — I5043 Acute on chronic combined systolic (congestive) and diastolic (congestive) heart failure: Secondary | ICD-10-CM | POA: Diagnosis present

## 2017-06-15 DIAGNOSIS — J9601 Acute respiratory failure with hypoxia: Secondary | ICD-10-CM | POA: Diagnosis not present

## 2017-06-15 DIAGNOSIS — I672 Cerebral atherosclerosis: Secondary | ICD-10-CM | POA: Diagnosis present

## 2017-06-15 DIAGNOSIS — H9192 Unspecified hearing loss, left ear: Secondary | ICD-10-CM | POA: Diagnosis present

## 2017-06-15 DIAGNOSIS — I272 Pulmonary hypertension, unspecified: Secondary | ICD-10-CM

## 2017-06-15 DIAGNOSIS — N179 Acute kidney failure, unspecified: Secondary | ICD-10-CM | POA: Diagnosis present

## 2017-06-15 DIAGNOSIS — R0602 Shortness of breath: Secondary | ICD-10-CM | POA: Diagnosis not present

## 2017-06-15 DIAGNOSIS — N183 Chronic kidney disease, stage 3 unspecified: Secondary | ICD-10-CM

## 2017-06-15 DIAGNOSIS — J431 Panlobular emphysema: Secondary | ICD-10-CM | POA: Diagnosis present

## 2017-06-15 DIAGNOSIS — I5022 Chronic systolic (congestive) heart failure: Secondary | ICD-10-CM | POA: Diagnosis not present

## 2017-06-15 DIAGNOSIS — D472 Monoclonal gammopathy: Secondary | ICD-10-CM | POA: Diagnosis not present

## 2017-06-15 DIAGNOSIS — I071 Rheumatic tricuspid insufficiency: Secondary | ICD-10-CM | POA: Diagnosis not present

## 2017-06-15 DIAGNOSIS — J441 Chronic obstructive pulmonary disease with (acute) exacerbation: Secondary | ICD-10-CM | POA: Diagnosis present

## 2017-06-15 DIAGNOSIS — I951 Orthostatic hypotension: Secondary | ICD-10-CM | POA: Diagnosis not present

## 2017-06-15 DIAGNOSIS — M7989 Other specified soft tissue disorders: Secondary | ICD-10-CM | POA: Diagnosis not present

## 2017-06-15 DIAGNOSIS — Z7901 Long term (current) use of anticoagulants: Secondary | ICD-10-CM

## 2017-06-15 DIAGNOSIS — Z951 Presence of aortocoronary bypass graft: Secondary | ICD-10-CM

## 2017-06-15 DIAGNOSIS — I5031 Acute diastolic (congestive) heart failure: Secondary | ICD-10-CM | POA: Diagnosis not present

## 2017-06-15 DIAGNOSIS — E785 Hyperlipidemia, unspecified: Secondary | ICD-10-CM | POA: Diagnosis present

## 2017-06-15 DIAGNOSIS — Z79899 Other long term (current) drug therapy: Secondary | ICD-10-CM

## 2017-06-15 DIAGNOSIS — I5033 Acute on chronic diastolic (congestive) heart failure: Secondary | ICD-10-CM | POA: Diagnosis not present

## 2017-06-15 DIAGNOSIS — J449 Chronic obstructive pulmonary disease, unspecified: Secondary | ICD-10-CM | POA: Diagnosis not present

## 2017-06-15 DIAGNOSIS — Z87442 Personal history of urinary calculi: Secondary | ICD-10-CM

## 2017-06-15 DIAGNOSIS — I4892 Unspecified atrial flutter: Secondary | ICD-10-CM | POA: Diagnosis present

## 2017-06-15 DIAGNOSIS — Z825 Family history of asthma and other chronic lower respiratory diseases: Secondary | ICD-10-CM

## 2017-06-15 DIAGNOSIS — I2510 Atherosclerotic heart disease of native coronary artery without angina pectoris: Secondary | ICD-10-CM | POA: Diagnosis not present

## 2017-06-15 DIAGNOSIS — D631 Anemia in chronic kidney disease: Secondary | ICD-10-CM | POA: Diagnosis not present

## 2017-06-15 DIAGNOSIS — I360 Nonrheumatic tricuspid (valve) stenosis: Secondary | ICD-10-CM | POA: Diagnosis not present

## 2017-06-15 HISTORY — DX: Acute respiratory failure with hypoxia: J96.01

## 2017-06-15 HISTORY — DX: Acute ischemic heart disease, unspecified: I24.9

## 2017-06-15 LAB — BASIC METABOLIC PANEL
ANION GAP: 11 (ref 5–15)
BUN: 16 mg/dL (ref 6–20)
CHLORIDE: 102 mmol/L (ref 101–111)
CO2: 24 mmol/L (ref 22–32)
Calcium: 8.8 mg/dL — ABNORMAL LOW (ref 8.9–10.3)
Creatinine, Ser: 1.42 mg/dL — ABNORMAL HIGH (ref 0.61–1.24)
GFR calc non Af Amer: 43 mL/min — ABNORMAL LOW (ref >60)
GFR, EST AFRICAN AMERICAN: 50 mL/min — AB (ref >60)
Glucose, Bld: 130 mg/dL — ABNORMAL HIGH (ref 65–99)
Potassium: 3.9 mmol/L (ref 3.5–5.1)
Sodium: 137 mmol/L (ref 135–145)

## 2017-06-15 LAB — TROPONIN I: Troponin I: <0.03 ng/mL (ref <0.03)

## 2017-06-15 LAB — CBC WITH DIFFERENTIAL/PLATELET
BASOS ABS: 0 10*3/uL (ref 0.0–0.1)
BASOS PCT: 0 %
Eosinophils Absolute: 0.6 10*3/uL (ref 0.0–0.7)
Eosinophils Relative: 9 %
HEMATOCRIT: 36.1 % — AB (ref 39.0–52.0)
HEMOGLOBIN: 11.6 g/dL — AB (ref 13.0–17.0)
Lymphocytes Relative: 25 %
Lymphs Abs: 1.7 10*3/uL (ref 0.7–4.0)
MCH: 32.1 pg (ref 26.0–34.0)
MCHC: 32.1 g/dL (ref 30.0–36.0)
MCV: 100 fL (ref 78.0–100.0)
MONOS PCT: 8 %
Monocytes Absolute: 0.6 10*3/uL (ref 0.1–1.0)
NEUTROS ABS: 4 10*3/uL (ref 1.7–7.7)
NEUTROS PCT: 58 %
Platelets: 139 10*3/uL — ABNORMAL LOW (ref 150–400)
RBC: 3.61 MIL/uL — ABNORMAL LOW (ref 4.22–5.81)
RDW: 12.7 % (ref 11.5–15.5)
WBC: 6.8 10*3/uL (ref 4.0–10.5)

## 2017-06-15 LAB — I-STAT TROPONIN, ED
TROPONIN I, POC: 0.01 ng/mL (ref 0.00–0.08)
Troponin i, poc: 0.01 ng/mL (ref 0.00–0.08)

## 2017-06-15 LAB — HEPARIN LEVEL (UNFRACTIONATED): Heparin Unfractionated: >2.2 IU/mL — ABNORMAL HIGH (ref 0.30–0.70)

## 2017-06-15 LAB — BRAIN NATRIURETIC PEPTIDE: B NATRIURETIC PEPTIDE 5: 343 pg/mL — AB (ref 0.0–100.0)

## 2017-06-15 LAB — APTT
APTT: 40 s — AB (ref 24–36)
APTT: 136 s — AB (ref 24–36)

## 2017-06-15 MED ORDER — ACETAMINOPHEN 325 MG PO TABS: 650.0000 mg | ORAL_TABLET | Freq: Four times a day (QID) | ORAL | Status: DC | PRN

## 2017-06-15 MED ORDER — ACETAMINOPHEN 650 MG RE SUPP: 650.0000 mg | Freq: Four times a day (QID) | RECTAL | Status: DC | PRN

## 2017-06-15 MED ORDER — METOPROLOL TARTRATE 5 MG/5ML IV SOLN
2.5000 mg | Freq: Once | INTRAVENOUS | Status: AC
  Administered 2017-06-15: 2.5 mg via INTRAVENOUS
  Filled 2017-06-15: qty 5

## 2017-06-15 MED ORDER — METOPROLOL TARTRATE 25 MG PO TABS
75.0000 mg | ORAL_TABLET | Freq: Two times a day (BID) | ORAL | Status: DC
  Administered 2017-06-15 – 2017-06-17 (×4): 75 mg via ORAL
  Filled 2017-06-15 (×5): qty 3

## 2017-06-15 MED ORDER — IPRATROPIUM-ALBUTEROL 0.5-2.5 (3) MG/3ML IN SOLN
3.0000 mL | Freq: Once | RESPIRATORY_TRACT | Status: AC
  Administered 2017-06-15: 3 mL via RESPIRATORY_TRACT
  Filled 2017-06-15: qty 3

## 2017-06-15 MED ORDER — NITROGLYCERIN 0.4 MG SL SUBL
0.4000 mg | SUBLINGUAL_TABLET | SUBLINGUAL | Status: DC | PRN
  Administered 2017-06-15 (×2): 0.4 mg via SUBLINGUAL
  Filled 2017-06-15: qty 1

## 2017-06-15 MED ORDER — FUROSEMIDE 10 MG/ML IJ SOLN
40.0000 mg | Freq: Once | INTRAMUSCULAR | Status: AC
  Administered 2017-06-15: 40 mg via INTRAVENOUS
  Filled 2017-06-15: qty 4

## 2017-06-15 MED ORDER — ALBUTEROL SULFATE (2.5 MG/3ML) 0.083% IN NEBU
5.0000 mg | INHALATION_SOLUTION | Freq: Once | RESPIRATORY_TRACT | Status: AC
  Administered 2017-06-15: 5 mg via RESPIRATORY_TRACT
  Filled 2017-06-15: qty 6

## 2017-06-15 MED ORDER — IPRATROPIUM-ALBUTEROL 0.5-2.5 (3) MG/3ML IN SOLN
3.0000 mL | Freq: Four times a day (QID) | RESPIRATORY_TRACT | Status: DC
  Filled 2017-06-15: qty 3

## 2017-06-15 MED ORDER — TAMSULOSIN HCL 0.4 MG PO CAPS
0.4000 mg | ORAL_CAPSULE | Freq: Every day | ORAL | Status: DC
  Administered 2017-06-15 – 2017-06-16 (×2): 0.4 mg via ORAL
  Filled 2017-06-15 (×2): qty 1

## 2017-06-15 MED ORDER — DIGOXIN 125 MCG PO TABS
0.1250 mg | ORAL_TABLET | Freq: Every day | ORAL | Status: DC
Start: 2017-06-15 — End: 2017-06-17
  Administered 2017-06-16 – 2017-06-17 (×2): 0.125 mg via ORAL
  Filled 2017-06-15 (×2): qty 1

## 2017-06-15 MED ORDER — HEPARIN (PORCINE) IN NACL 100-0.45 UNIT/ML-% IJ SOLN
1050.0000 [IU]/h | INTRAMUSCULAR | Status: DC
  Administered 2017-06-15: 1200 [IU]/h via INTRAVENOUS
  Filled 2017-06-15: qty 250

## 2017-06-15 MED ORDER — IPRATROPIUM-ALBUTEROL 0.5-2.5 (3) MG/3ML IN SOLN
3.0000 mL | Freq: Three times a day (TID) | RESPIRATORY_TRACT | Status: DC
  Administered 2017-06-16 – 2017-06-17 (×4): 3 mL via RESPIRATORY_TRACT
  Filled 2017-06-15 (×5): qty 3

## 2017-06-15 MED ORDER — ATORVASTATIN CALCIUM 40 MG PO TABS
40.0000 mg | ORAL_TABLET | Freq: Every day | ORAL | Status: DC
  Administered 2017-06-15 – 2017-06-17 (×3): 40 mg via ORAL
  Filled 2017-06-15 (×2): qty 2
  Filled 2017-06-15: qty 1

## 2017-06-15 MED ORDER — SODIUM CHLORIDE 0.9 % IV BOLUS (SEPSIS)
500.0000 mL | Freq: Once | INTRAVENOUS | Status: AC
  Administered 2017-06-15: 500 mL via INTRAVENOUS

## 2017-06-15 MED ORDER — METOPROLOL TARTRATE 25 MG PO TABS: 50.0000 mg | ORAL_TABLET | ORAL | Status: DC

## 2017-06-15 MED ORDER — FUROSEMIDE 20 MG PO TABS
20.0000 mg | ORAL_TABLET | Freq: Every day | ORAL | Status: DC
  Administered 2017-06-16 – 2017-06-17 (×2): 20 mg via ORAL
  Filled 2017-06-15 (×2): qty 1

## 2017-06-15 MED ORDER — ALBUTEROL SULFATE (2.5 MG/3ML) 0.083% IN NEBU: 2.5000 mg | INHALATION_SOLUTION | RESPIRATORY_TRACT | Status: DC | PRN

## 2017-06-15 MED ORDER — ALBUTEROL (5 MG/ML) CONTINUOUS INHALATION SOLN
10.0000 mg/h | INHALATION_SOLUTION | Freq: Once | RESPIRATORY_TRACT | Status: AC
  Administered 2017-06-15: 10 mg/h via RESPIRATORY_TRACT
  Filled 2017-06-15: qty 20

## 2017-06-15 MED ORDER — NITROGLYCERIN 0.4 MG SL SUBL: 0.4000 mg | SUBLINGUAL_TABLET | SUBLINGUAL | Status: DC | PRN

## 2017-06-15 MED ORDER — APIXABAN 5 MG PO TABS
5.0000 mg | ORAL_TABLET | Freq: Two times a day (BID) | ORAL | Status: DC
Start: 2017-06-15 — End: 2017-06-15

## 2017-06-15 MED ORDER — PREDNISONE 20 MG PO TABS
40.0000 mg | ORAL_TABLET | Freq: Every day | ORAL | Status: DC
  Administered 2017-06-16 – 2017-06-17 (×2): 40 mg via ORAL
  Filled 2017-06-15 (×2): qty 2

## 2017-06-15 MED ORDER — MIDODRINE HCL 5 MG PO TABS
5.0000 mg | ORAL_TABLET | Freq: Three times a day (TID) | ORAL | Status: DC
  Administered 2017-06-15 – 2017-06-17 (×6): 5 mg via ORAL
  Filled 2017-06-15 (×6): qty 1

## 2017-06-15 MED ORDER — LIVING BETTER WITH HEART FAILURE BOOK
Freq: Once | Status: AC
  Administered 2017-06-15: 16:00:00

## 2017-06-15 NOTE — ED Notes (Signed)
Verbal given for nitro 1st given 0915.  2 given at [phone]

## 2017-06-15 NOTE — ED Triage Notes (Signed)
Pt brought to ED by [facility] for SOB for the past week getting worse to night pt using inhaler at home with no relief, Hx of COPD, HTN. 1 Albuterol, 125 mg Solumedrol and 1 Due neb given by EMS pta to ED. BP 142/70, HR 80, R-20 SPO2 99% on 4 L.

## 2017-06-15 NOTE — H&P (Signed)
Date: 06/15/2017               Patient Name:  [name] MRN: [id number]  DOB: 04/08/1929 Age / Sex: 83 y.o., male   PCP: Cyndi Bender, PA-C         Medical Service: Internal Medicine Teaching Service         Attending Physician: Dr. Annia Belt, MD    First Contact: Dr. Einar Gip, DO Pager: [phone]  Second Contact: Dr. Zada Finders Pager: [phone]       After Hours (After 5p/  First Contact Pager: [phone]  weekends / holidays): Second Contact Pager: [phone]   Chief Complaint: shortness of breath  History of Present Illness:  This is a very pleasant 83 year old male with extensive medical history significant for COPD, CAD s/p CABG x4 2006 with low-risk myoview 08/2016, SSS s/p pacer placement in 2015, PAF (Eliquis, digoxin and Lopressor), CKD Stage 3 and MGUS. He presents today for evaluation of a several week history of progressive shortness of breath with acute worsening this AM w/ ambulation. He has been using his albuterol Q4H for the past few weeks and Q5mins-1 hour today without much relief. He is usually quite active, recently winning 53 gold medals in the [employer]" however now is unable to ambulate 50 feet without having to stop due to DOE. Endorses PND and orthopnea for past week as well. Notes compliance with his ICS/LABA inhaler and has history of 1 COPD exacerbation 30+ years ago.    Denies any sick contacts fevers, chills, rhinorrhea, sore throat, productive cough, chest pain, abdominal pain, changes in BMs, nausea or vomiting. Otherwise in good health but has noted BL LE swelling, L>R, for several weeks. He was started on Lasix 20 mg daily with some improvement however has persistent LLE edema. Notes OA of left knee and has difficulty bearing weight on the extremity with plans for TKR in near future.   Initially in the ED, temperature 97.8*, pulse 89, BP 149/54, respirations 22 and he was saturating 98% on 2L O2 via [place]. He was given  solumedrol, albuterol and nebulizer treatments with improvement of his dyspnea. EKG without CXR without effusion, infiltrate or pulmonary edema. On ambulation, he desaturated to 86% and IMTS was called for treatment of COPD exacerbation.   While in the ED the patient experienced significant anterior chest pain, dyspnea and tachycardia to 120. EKG at that time with ?new ST segment depressions in V3-V5. He was given nitroglycerin with prompt improvement of his chest pain. He was started on Heparin and cardiology was consulted.   Meds:  Current Meds  Medication Sig  . albuterol (PROVENTIL HFA;VENTOLIN HFA) 108 (90 Base) MCG/ACT inhaler Inhale 2 puffs into the lungs every 4 (four) hours as needed for wheezing or shortness of breath.  Marland Kitchen apixaban (ELIQUIS) 5 MG TABS tablet Take 1 tablet (5 mg total) by mouth every 12 (twelve) hours.  Marland Kitchen atorvastatin (LIPITOR) 40 MG tablet Take 1 tablet (40 mg total) by mouth daily. PLEASE CONTACT OFFICE FOR ADDITIONAL REFILLS  . digoxin (LANOXIN) 0.125 MG tablet Take 1 tablet (0.125 mg total) by mouth daily.  . diphenhydramine-acetaminophen (TYLENOL PM) 25-500 MG TABS Take 2 tablets by mouth at bedtime.   . furosemide (LASIX) 20 MG tablet Take 20 mg by mouth daily.  . Methylcellulose, Laxative, (CITRUCEL) 500 MG TABS Take 1,000 mg by mouth at bedtime.  . metoprolol (LOPRESSOR) 50 MG tablet Take 75 mg by mouth 2 (two) times  daily.   . midodrine (PROAMATINE) 5 MG tablet TAKE 1 TABLET (5 MG TOTAL) BY MOUTH 3 (THREE) TIMES DAILY.  . niacin (NIASPAN) 500 MG CR tablet TAKE 1 TABLET (500 MG TOTAL) BY MOUTH AT BEDTIME.  . Probiotic Product ([employer] SENIOR PROBIOTIC PO) Take 1 capsule by mouth at bedtime. Once daily  . Tamsulosin HCl (FLOMAX) 0.4 MG CAPS Take 0.4 mg by mouth at bedtime.   . Tiotropium Bromide-Olodaterol (STIOLTO RESPIMAT) 2.5-2.5 MCG/ACT AERS Inhale 2 puffs into the lungs daily.  . [DISCONTINUED] ENSURE (ENSURE) Take 1 Can by mouth every morning.    Allergies: Allergies as of 06/15/2017  . (No Known Allergies)   Past Medical History:  Diagnosis Date  . Anemia of renal disease 12/05/2011  . Anemia, iron deficiency 12/05/2011  . Asthma   . CAS (cerebral atherosclerosis)    Carotid Dopplers, 04/09/2012 - Bilateral Bulb/Proximal ICAs-mild to moderate amount of fibrous plaque of fibrous soft plaque w/o evidence of a significant diameter reduction, dissection, or any other vascular abnormality  . COPD (chronic obstructive pulmonary disease) ([place])   . Coronary heart disease    a. s/p CABG in 2006 with LIMA-LAD, SVG-Cx, SVG-IM-OM b. low-risk NST in 08/2016  . Dysrhythmia 11/09/2013   SVT VS A FLUTTER   . Kidney stones   . MGUS (monoclonal gammopathy of unknown significance) 12/05/2011  . Orthostatic hypotension 09/10/2016  . PAF (paroxysmal atrial fibrillation) (HCC)    a. on Eliquis  . Shortness of breath   . SSS (sick sinus syndrome) (HCC)    a. s/p PPM placement in 2015   Family History: Father with history of TB and MI. Mother with MI.   Social History: Former smoker, quit 60 years ago. Has never drank alcohol. Lives at home with his daughter who assists happily with all ADLs. Has another daughter that lives nearby as well. 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   Review of Systems: A complete ROS was negative except as per HPI.   Physical Exam: Blood pressure (!) 155/50, pulse 92, temperature 97.8 F (36.6 C), temperature source Oral, resp. rate (!) 27, height 5\' 9"  (1.753 m), weight 185 lb (83.9 kg), SpO2 100 %. General: Elderly, pale appearing caucasian male sitting upright in bed with nebulizer treatment. Initially jovial however distressed when CP developed.   HENT: PERRL. EOMI. No conjunctival injection, icterus or ptosis.  Cardiovascular: RRR. No murmur or rub appreciated. Pacer palpable on left chest wall. Pulmonary: Scattered faint wheezes throughout. No crackles or rhonchi. Able to speak in short sentences.   Abdomen: Soft, non-tender and not distended. +bowel sounds.  Extremities: 1+ edema LLE to knee. TTP posteromedial knee but without erythema or increased warmth. Intact distal pulses. Skin: Warm, dry. No cyanosis.  Neuro: Strength and sensation grossly intact. Follows commands.  Psych: Mood normal and affect was mood congruent. Responds to questions appropriately.   Initial EKG: Sinus rhythm, rate 97. TWI throughout but unchanged from priors. No ST segment changes.   Subsequent EKG: Sinus tachycardia, rate 120. Several PVCs. ?ST segment depressions V3-V5.   CXR: No cardiomegaly. Lungs clear without infiltrate, effusion or edema.   Assessment & Plan by Problem: Principal Problem:   Acute respiratory failure with hypoxia (HCC) Active Problems:   Paroxysmal atrial flutter (HCC)   Pacemaker- MDT 02/04/14   Hyperlipidemia   Monoclonal gammopathy of unknown significance (MGUS)   COPD exacerbation (HCC)   Anemia of chronic disease   CKD (chronic kidney disease) stage 3, GFR 30-59  ml/min   Acute coronary syndromes (HCC)  #Acute Respiratory Failure with Hypoxia ##COPD Exacerbation ###CAD s/p CABG x4 83 y/o M with COPD here with progressive DOE with acute exacerbation this morning which was not improved with home albuterol. Follows with pulmonology and is compliant with daily ICS/LABA. He denies any recent illness or productive cough however does note worsening SOB + dry cough. Had 1 COPD exacerbation before 30+ years ago. Symptoms initially improved with solumedrol and nebulizers in ED. Picture complicated by LLE  Swelling and acute onset chest pain which was promptly relieved by nitro. ?EKG changes while in ED. Cardiology on board and appreciate recs, believe 2/2 CHF exacerbation + COPD although EKG obtained during CP with ?changes. He did have a low-risk myoview 08/2016. FEV1 57% of predicted 09/2013 -Cardiology on board, appreciate recommendations -Prednisone 40 mg Qdaily -Duonebs scheduled  Q6H -Albuterol prn -Trending troponins -Heparin until finished cycling trops, resume Eliquis if negative -EKG in AM  #Chronic Systolic CHF ##LLE Swelling BNP 343 today. ECHO 11/2016 LVEF 80-03% without diastolic dysfunction. Recently started on Lasix 20 mg due to complaint of BL LE swelling. LLE swelling persists. CW 187 lbs and dry weight appears to be 185. Lungs are without crackles and abdomen is not distended.  -Daily weights -Strict I&O -Repeat ECHO -IV Lasix 40 mg x1 -Doppler to evaluate for DVT in this immobilized patient with acute worsening SOB + CP + tachycardia  #Chronic Renal Failure Cr 1.4 on admission, BL ~1.3. Monitor closely in setting of diuresis.  #Paroxysmal Atrial Fibrillation On Digoxin 0.125 mg daily, metoprolol 75 mg BID and eliquis 5 mg BID. In NSR or sinus tachycardia during exams.   #Orthostatic hypotension: On midodrine at home which has been continued  #MGUS: IgG lambda, Follows with Dr. Marin Olp  #Anemia of Chronic Disease: In setting of CKD + MGUS. Recent iron studies with Iron of 77, ferritin of 743. TIBC 231. Hb actually better than baseline.   #HLD: On Atorvastatin 40 mg daily., Continued.   Code status: FULL - this was discussed with patient and family upon admission IVF: none Diet: NPO for now DVT Ppx: Heparin  Dispo: Admit patient to Observation with expected length of stay less than 2 midnights.  SignedEinar Gip, DO 06/15/2017, 7:31 AM  Pager: [phone]

## 2017-06-15 NOTE — Progress Notes (Addendum)
ANTICOAGULATION CONSULT NOTE - Initial Consult  Pharmacy Consult:  Heparin Indication: chest pain/ACS + history of Afib  No Known Allergies  Patient Measurements: Height: 5\' 9"  (175.3 cm) Weight: 185 lb (83.9 kg) IBW/kg (Calculated) : 70.7 Heparin Dosing Weight: 84  Vital Signs: Temp: 97.8 F (36.6 C) (06/17 0419) Temp Source: Oral (06/17 0419) BP: 93/54 (06/17 0930) Pulse Rate: 119 (06/17 0930)  Labs:  Recent Labs  06/15/17 0426  HGB 11.6*  HCT 36.1*  PLT 139*  CREATININE 1.42*    Estimated Creatinine Clearance: 38 mL/min (A) (by C-G formula based on SCr of 1.42 mg/dL (H)).   Medical History: Past Medical History:  Diagnosis Date  . Anemia of renal disease 12/05/2011  . Anemia, iron deficiency 12/05/2011  . Asthma   . CAS (cerebral atherosclerosis)    Carotid Dopplers, 04/09/2012 - Bilateral Bulb/Proximal ICAs-mild to moderate amount of fibrous plaque of fibrous soft plaque w/o evidence of a significant diameter reduction, dissection, or any other vascular abnormality  . COPD (chronic obstructive pulmonary disease) ([place])   . Coronary heart disease    a. s/p CABG in 2006 with LIMA-LAD, SVG-Cx, SVG-IM-OM b. low-risk NST in 08/2016  . Dysrhythmia 11/09/2013   SVT VS A FLUTTER   . Kidney stones   . MGUS (monoclonal gammopathy of unknown significance) 12/05/2011  . Orthostatic hypotension 09/10/2016  . PAF (paroxysmal atrial fibrillation) (HCC)    a. on Eliquis  . Shortness of breath   . SSS (sick sinus syndrome) (HCC)    a. s/p PPM placement in 2015       Assessment: 16 YOM with history of Afib on Eliquis PTA, last dose possibly on 06/14/17 PM per family/RN.  Pharmacy consulted to initiate IV heparin for ACS.    Goal of Therapy:  Heparin level 0.3-0.7 units/ml aPTT 66 - 102 seconds Monitor platelets by anticoagulation protocol: Yes    Plan:  - Stat aPTT and heparin level before starting infusion.   Randee Huston D. Mina Marble, PharmD, BCPS Pager:  [phone] 06/15/2017, 9:39 AM   ===============================   Addendum: - baseline aPTT low at 40, heparin level pending but expect Eliquis to falsely elevate heparin levels - no bleeding reported   Plan: - D/C Eliquis - Heparin gtt at 1200 units/hr, no bolus - Check 8 hr aPTT - Daily heparin level, aPTT, CBC    Rubert Frediani D. Mina Marble, PharmD, BCPS Pager:  [phone] 06/15/2017, 11:00 AM

## 2017-06-15 NOTE — ED Notes (Signed)
Per Admitting Hold Digoxin, Lasix, and Lipitor

## 2017-06-15 NOTE — ED Notes (Signed)
Admitting at bedside 

## 2017-06-15 NOTE — ED Notes (Signed)
Pt ambulated on the hallway drop to 86% on RA, pt c/o SOB and got very labored breathing while ambulating.

## 2017-06-15 NOTE — ED Notes (Signed)
Patient alert talking at this time.

## 2017-06-15 NOTE — ED Provider Notes (Signed)
[facility] DEPT Provider Note   CSN: [id number] Arrival date & time: 06/15/17  0414     History   Chief Complaint Chief Complaint  Patient presents with  . Shortness of Breath    HPI [name] is a 83 y.o. male.  The history is provided by the patient.  Shortness of Breath  This is a new problem. The problem occurs frequently.The current episode started more than 2 days ago. The problem has been gradually worsening. Associated symptoms include cough, wheezing and leg swelling. Pertinent negatives include no fever, no headaches and no vomiting. Associated medical issues include COPD.   Patient with h/o CAD/COPD, h/o sick sinus syndrome with pacemaker in place presents with worsening SOB recently, acutely worse tonight He reports cough/wheeze/sob He reports feeling chest tightness with deep inspiration No fever No vomiting No HA He reports LE edema He is a nonsmoker He does not wear home Oxygen   He received solumedrol and nebs en route  Past Medical History:  Diagnosis Date  . Anemia of renal disease 12/05/2011  . Anemia, iron deficiency 12/05/2011  . Asthma   . CAS (cerebral atherosclerosis)    Carotid Dopplers, 04/09/2012 - Bilateral Bulb/Proximal ICAs-mild to moderate amount of fibrous plaque of fibrous soft plaque w/o evidence of a significant diameter reduction, dissection, or any other vascular abnormality  . COPD (chronic obstructive pulmonary disease) ([place])   . Coronary heart disease    a. s/p CABG in 2006 with LIMA-LAD, SVG-Cx, SVG-IM-OM b. low-risk NST in 08/2016  . Dysrhythmia 11/09/2013   SVT VS A FLUTTER   . Kidney stones   . MGUS (monoclonal gammopathy of unknown significance) 12/05/2011  . Orthostatic hypotension 09/10/2016  . PAF (paroxysmal atrial fibrillation) (HCC)    a. on Eliquis  . Shortness of breath   . SSS (sick sinus syndrome) ([place])    a. s/p PPM placement in 2015    Patient Active Problem List   Diagnosis Date Noted  .  Calculus of gallbladder without cholecystitis without obstruction 02/13/2017  . CAD (coronary artery disease) 01/23/2017  . Sepsis secondary to UTI ([place])   . Bacteremia due to Enterobacter species   . Monoclonal gammopathy of unknown significance (MGUS)   . Panlobular emphysema ([place])   . Acute renal failure with acute tubular necrosis superimposed on chronic kidney disease ([place])   . Other hyperlipidemia   . UTI (urinary tract infection) 12/01/2016  . Lactic acidosis 12/01/2016  . Acute respiratory failure with hypoxia ([place]) 12/01/2016  . Acute-on-chronic kidney injury ([place]) 12/01/2016  . Sepsis ([place]) 12/01/2016  . Orthostatic hypotension 09/10/2016  . Chronic anticoagulation 04/23/2016  . Hyperlipidemia 05/09/2014  . AVB (atrioventricular block)- high grade second degree 02/05/2014  . Pacemaker- MDT 02/04/14 02/04/2014  . 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 Nonspecific ST-T wave electrocardiographic changes   . COPD (chronic obstructive pulmonary disease) ([place])   . Paroxysmal atrial flutter ([place]) 08/15/2013  . Syncope 06/24/2013  . Bronchitis with airway obstruction ([place]) 05/15/2012  . MGUS (monoclonal gammopathy of unknown significance) 12/05/2011  . Anemia of renal disease 12/05/2011  . Anemia, iron deficiency 12/05/2011  . ALLERGIC RHINITIS 04/06/2008  . EMPHYSEMA 03/11/2008  . C O P D 03/11/2008    Past Surgical History:  Procedure Laterality Date  . CARDIAC CATHETERIZATION  12/05/2005   Recommended CABG  . CARDIOVERSION N/A 11/09/2013   Procedure: CARDIOVERSION;  Surgeon: Pixie Casino, MD;  [facility] ENDOSCOPY;  Service: Cardiovascular;  Laterality: N/A;  . CATARACT  EXTRACTION    . CORONARY ARTERY BYPASS GRAFT  12/06/2005   x4, LIMA to LAD, vein to distal circumflex, sequential vein to intermediate and obtuse marginal vessel  . INNER EAR SURGERY    . KIDNEY STONE SURGERY    . LOOP RECORDER IMPLANT N/A 11/30/2013   Procedure: LOOP RECORDER IMPLANT;  Surgeon: Sanda Klein,  MD;  Location: [facility] CATH LAB;  Service: Cardiovascular;  Laterality: N/A;  . PERMANENT PACEMAKER INSERTION N/A 02/04/2014   Procedure: PERMANENT PACEMAKER INSERTION;  Surgeon: Sanda Klein, MD;  Location: [facility] CATH LAB;  Service: Cardiovascular;  Laterality: N/A;  . TEE WITHOUT CARDIOVERSION N/A 11/09/2013   Procedure: TRANSESOPHAGEAL ECHOCARDIOGRAM (TEE);  Surgeon: Pixie Casino, MD;  Location: [facility] ENDOSCOPY;  Service: Cardiovascular;  Laterality: N/A;       Home Medications    Prior to Admission medications   Medication Sig Start Date End Date Taking? Authorizing Provider  acetaminophen (TYLENOL) 500 MG tablet Take 1,000 mg by mouth every 6 (six) hours as needed.    [provider]  albuterol (PROVENTIL HFA;VENTOLIN HFA) 108 (90 Base) MCG/ACT inhaler Inhale 2 puffs into the lungs every 4 (four) hours as needed for wheezing or shortness of breath. 12/09/16   Collene Gobble, MD  apixaban (ELIQUIS) 5 MG TABS tablet Take 1 tablet (5 mg total) by mouth every 12 (twelve) hours. 12/18/16   Troy Sine, MD  atorvastatin (LIPITOR) 40 MG tablet Take 1 tablet (40 mg total) by mouth daily. PLEASE CONTACT OFFICE FOR ADDITIONAL REFILLS 05/14/17   Troy Sine, MD  digoxin (LANOXIN) 0.125 MG tablet Take 1 tablet (0.125 mg total) by mouth daily. 01/03/17   Croitoru, Mihai, MD  diphenhydramine-acetaminophen (TYLENOL PM) 25-500 MG TABS Take 2 tablets by mouth at bedtime.     [provider]  ENSURE (ENSURE) Take 1 Can by mouth every morning.    [provider]  furosemide (LASIX) 20 MG tablet Take 20 mg by mouth daily. 05/23/17   [provider]  metoprolol (LOPRESSOR) 50 MG tablet Take 50 mg by mouth as directed. 1 ANE 1/2 TABLET BY MOUTH TWICE A DAY    [provider]  midodrine (PROAMATINE) 5 MG tablet TAKE 1 TABLET (5 MG TOTAL) BY MOUTH 3 (THREE) TIMES DAILY. 04/23/16   Brett Canales, PA-C  niacin (NIASPAN) 500 MG CR tablet TAKE 1 TABLET (500 MG TOTAL) BY  MOUTH AT BEDTIME. 04/18/17   Croitoru, Mihai, MD  Probiotic Product ([employer] SENIOR PROBIOTIC PO) Take 1 capsule by mouth at bedtime. Once daily    [provider]  Tamsulosin HCl (FLOMAX) 0.4 MG CAPS Take 0.4 mg by mouth at bedtime.     [provider]  Tiotropium Bromide-Olodaterol (STIOLTO RESPIMAT) 2.5-2.5 MCG/ACT AERS Inhale 2 puffs into the lungs daily. 05/15/17   Collene Gobble, MD    Family History Family History  Problem Relation Age of Onset  . Tuberculosis Father   . Heart attack Father   . Heart disease Mother   . Heart attack Mother   . Heart disease Brother   . Emphysema Daughter        premature without tobacco exposure    Social History Social History  Substance Use Topics  . Smoking status: Former Smoker    Packs/day: 1.00    Years: 47.00    Types: Cigarettes    Start date: 12/02/1951    Quit date: 12/30/1993  . Smokeless tobacco: Never Used     Comment: quit  20 years ago  . Alcohol use No     Allergies   Patient has no known allergies.   Review of Systems Review of Systems  Constitutional: Negative for fever.  Respiratory: Positive for cough, shortness of breath and wheezing.   Cardiovascular: Positive for leg swelling.  Gastrointestinal: Negative for vomiting.  Neurological: Negative for headaches.  All other systems reviewed and are negative.    Physical Exam Updated Vital Signs BP (!) 149/54   Pulse 89   Temp 97.8 F (36.6 C) (Oral)   Resp (!) 22   Ht 1.753 m (5\' 9" )   Wt 83.9 kg (185 lb)   SpO2 98%   BMI 27.32 kg/m   Physical Exam CONSTITUTIONAL: Elderly, no acute distress HEAD: Normocephalic/atraumatic EYES: EOMI/PERRL ENMT: Mucous membranes moist NECK: supple no meningeal signs SPINE/BACK:entire spine nontender CV: S1/S2 noted, no murmurs/rubs/gallops noted LUNGS: wheeze noted bilaterally ABDOMEN: soft, nontender  GU:no cva tenderness NEURO: Pt is awake/alert/appropriate, moves all extremitiesx4.  No facial  droop.   EXTREMITIES: pulses normal/equal, full ROM, symmetric/pitting LE edema noted SKIN: warm, color normal PSYCH: no abnormalities of mood noted, alert and oriented to situation   ED Treatments / Results  Labs (all labs ordered are listed, but only abnormal results are displayed) Labs Reviewed  CBC WITH DIFFERENTIAL/PLATELET - Abnormal; Notable for the following:       Result Value   RBC 3.61 (*)    Hemoglobin 11.6 (*)    HCT 36.1 (*)    Platelets 139 (*)    All other components within normal limits  BASIC METABOLIC PANEL - Abnormal; Notable for the following:    Glucose, Bld 130 (*)    Creatinine, Ser 1.42 (*)    Calcium 8.8 (*)    GFR calc non Af Amer 43 (*)    GFR calc Af Amer 50 (*)    All other components within normal limits  BRAIN NATRIURETIC PEPTIDE - Abnormal; Notable for the following:    B Natriuretic Peptide 343.0 (*)    All other components within normal limits  I-STAT TROPOININ, ED    EKG  EKG Interpretation  Date/Time:  Sunday June 15 2017 04:16:20 EDT Ventricular Rate:  97 PR Interval:    QRS Duration: 109 QT Interval:  398 QTC Calculation: 487 R Axis:   92 Text Interpretation:  Sinus rhythm Ventricular premature complex Aberrant conduction of SV complex(es) Low voltage with right axis deviation Minimal ST depression, anterolateral leads Borderline prolonged QT interval Abnormal ekg Confirmed by Ripley Fraise [phone]) on 06/15/2017 4:25:15 AM       Radiology Dg Chest 2 View  Result Date: 06/15/2017 CLINICAL DATA:  Acute onset of worsening shortness of breath. Initial encounter. EXAM: CHEST  2 VIEW COMPARISON:  Chest radiograph performed 12/02/2016 FINDINGS: The lungs are well-aerated. Minimal left basilar atelectasis is noted. There is no evidence of pleural effusion or pneumothorax. The heart is normal in size; the patient is status post median sternotomy. A pacemaker is noted at the left chest wall, with leads ending at the right atrium and right  ventricle. No acute osseous abnormalities are seen. IMPRESSION: Minimal left basilar atelectasis noted.  Lungs otherwise clear. Electronically Signed   By: Garald Balding M.D.   On: 06/15/2017 06:21    Procedures Procedures  CRITICAL CARE Performed by: Sharyon Cable Total critical care time: 31 minutes Critical care time was exclusive of separately billable procedures and treating other patients. Critical care was necessary to treat or prevent imminent or  life-threatening deterioration. Critical care was time spent personally by me on the following activities: development of treatment plan with patient and/or surrogate as well as nursing, discussions with consultants, evaluation of patient's response to treatment, examination of patient, obtaining history from patient or surrogate, ordering and performing treatments and interventions, ordering and review of laboratory studies, ordering and review of radiographic studies, pulse oximetry and re-evaluation of patient's condition. PATIENT WITH COPD EXACERBATION REQUIRING MULTIPLE NEBULIZER TREATMENTS BUT STILL WITH HYPOXIA ON AMBULATION  Medications Ordered in ED Medications  albuterol (PROVENTIL) (2.5 MG/3ML) 0.083% nebulizer solution 5 mg (5 mg Nebulization Given 06/15/17 0513)  ipratropium-albuterol (DUONEB) 0.5-2.5 (3) MG/3ML nebulizer solution 3 mL (3 mLs Nebulization Given 06/15/17 0626)  albuterol (PROVENTIL,VENTOLIN) solution continuous neb (10 mg/hr Nebulization Given 06/15/17 2924)     Initial Impression / Assessment and Plan / ED Course  I have reviewed the triage vital signs and the nursing notes.  Pertinent labs & imaging results that were available during my care of the patient were reviewed by me and considered in my medical decision making (see chart for details).     6:35 AM Pt here with progressive worsening SOB He has h/o COPD He appears improved CXR negative 7:21 AM PT DID NOT TOLERATE AMBULATION Will admit D/w  internal medicine service for admission Updated on patient/family on plan Pt resting comfortably  Final Clinical Impressions(s) / ED Diagnoses   Final diagnoses:  COPD exacerbation ([place])    New Prescriptions New Prescriptions   No medications on file     Ripley Fraise, MD 06/15/17 [phone]

## 2017-06-15 NOTE — Consult Note (Signed)
Cardiology Consultation:   Patient ID: [name]; [id number]; 30-Aug-1929   Admit date: 06/15/2017 Date of Consult: 06/15/2017  Primary Care Provider: Cyndi Bender, PA-C Primary Cardiologist: Dr Claiborne Billings Primary Electrophysiologist:  Dr Sallyanne Kuster   Patient Profile:   [name] is a 83 y.o. male with a hx of CAD (s/p CABG in 2006 with LIMA-LAD, SVG-Cx, SVG-IM-OM, low-risk NST in 08/2016), SSS (s/p PPM placement 2015), PAF (on Eliquis), orthostatic hypotension, and COPD who is being seen today for the evaluation of CHF at the request of Dr Beryle Beams.  History of Present Illness:   [name] has been having problems with SOB for over a week. He was seen in the cardiology office 06/07. He had been started on Lasix 20 mg qd by his PCP and described improvement in his symptoms. Was told to continue Lasix. Has not been weighing daily. No new sodium indiscretion.   However, pt states that he has been having PND and describes orthopnea in addition to the DOE and LE edema. He denies wheezing. Finally, he was completely unable to lie down last pm and came to the ER.   He received 500 cc NS bolus, neb treatment and Lopressor 2.5 mg IV.   His breathing is a little better but he is still struggling to breathe on O2.  He has upper abdominal pain due to the SOB, this improves as his SOB improves.   He never has palpitations, no awareness of atrial fib or PVCs.  He has not had chest pain like pre-CABG sx.   Past Medical History:  Diagnosis Date  . Anemia of renal disease 12/05/2011  . Anemia, iron deficiency 12/05/2011  . Asthma   . CAS (cerebral atherosclerosis)    Carotid Dopplers, 04/09/2012 - Bilateral Bulb/Proximal ICAs-mild to moderate amount of fibrous plaque of fibrous soft plaque w/o evidence of a significant diameter reduction, dissection, or any other vascular abnormality  . COPD (chronic obstructive pulmonary disease) ([place])   . Coronary heart disease    a. s/p  CABG in 2006 with LIMA-LAD, SVG-Cx, SVG-IM-OM b. low-risk NST in 08/2016  . Dysrhythmia 11/09/2013   SVT VS A FLUTTER   . Kidney stones   . MGUS (monoclonal gammopathy of unknown significance) 12/05/2011  . Orthostatic hypotension 09/10/2016  . PAF (paroxysmal atrial fibrillation) (HCC)    a. on Eliquis  . Shortness of breath   . SSS (sick sinus syndrome) ([place])    a. s/p PPM placement in 2015    Past Surgical History:  Procedure Laterality Date  . CARDIAC CATHETERIZATION  12/05/2005   Recommended CABG  . CARDIOVERSION N/A 11/09/2013   Procedure: CARDIOVERSION;  Surgeon: Pixie Casino, MD;  Location: [facility] ENDOSCOPY;  Service: Cardiovascular;  Laterality: N/A;  . CATARACT EXTRACTION    . CORONARY ARTERY BYPASS GRAFT  12/06/2005   x4, LIMA to LAD, vein to distal circumflex, sequential vein to intermediate and obtuse marginal vessel  . INNER EAR SURGERY    . KIDNEY STONE SURGERY    . LOOP RECORDER IMPLANT N/A 11/30/2013   Procedure: LOOP RECORDER IMPLANT;  Surgeon: Sanda Klein, MD;  Location: [facility] CATH LAB;  Service: Cardiovascular;  Laterality: N/A;  . PERMANENT PACEMAKER INSERTION N/A 02/04/2014   Procedure: PERMANENT PACEMAKER INSERTION;  Surgeon: Sanda Klein, MD;  Location: [facility] CATH LAB;  Service: Cardiovascular;  Laterality: N/A;  . TEE WITHOUT CARDIOVERSION N/A 11/09/2013   Procedure: TRANSESOPHAGEAL ECHOCARDIOGRAM (TEE);  Surgeon: Pixie Casino, MD;  Location: [facility];  Service:  Cardiovascular;  Laterality: N/A;     Inpatient Medications: Scheduled Meds: . atorvastatin  40 mg Oral Daily  . digoxin  0.125 mg Oral Daily  . furosemide  20 mg Oral Daily  . ipratropium-albuterol  3 mL Nebulization Q6H  . metoprolol tartrate  50 mg Oral UD  . midodrine  5 mg Oral TID WC  . [START ON 06/16/2017] predniSONE  40 mg Oral Q breakfast  . tamsulosin  0.4 mg Oral QHS   Continuous Infusions: . heparin     PRN Meds: acetaminophen **OR** acetaminophen, albuterol, nitroGLYCERIN,  nitroGLYCERIN  Allergies:   No Known Allergies  Social History:   Social History   Social History  . Marital status: Married    Spouse name: N/A  . Number of children: N/A  . Years of education: N/A   Occupational History  . retired long distance Administrator   . maintenence    Social History Main Topics  . Smoking status: Former Smoker    Packs/day: 1.00    Years: 47.00    Types: Cigarettes    Start date: 12/02/1951    Quit date: 12/30/1993  . Smokeless tobacco: Never Used     Comment: quit 20 years ago  . Alcohol use No  . Drug use: No  . Sexual activity: Not on file   Other Topics Concern  . Not on file   Social History Narrative   Pt lives with daughter.     Family History:   The patient's family history includes Emphysema in his daughter; Heart attack in his father and mother; Heart disease in his brother and mother; Tuberculosis in his father.  ROS:  Please see the history of present illness.  Review of Systems  Constitution: Positive for weight gain.  HENT: Negative.   Eyes: Negative.   Cardiovascular: Positive for dyspnea on exertion, leg swelling, orthopnea and paroxysmal nocturnal dyspnea.  Respiratory: Positive for shortness of breath and sleep disturbances due to breathing.   Endocrine: Negative.   Skin: Negative.   Musculoskeletal: Negative.   Gastrointestinal: Positive for bloating.  Genitourinary: Negative.   Neurological: Negative.   Psychiatric/Behavioral: Negative.   Allergic/Immunologic: Negative.     All other ROS reviewed and negative.     Physical Exam/Data:   Vitals:   06/15/17 1015 06/15/17 1030 06/15/17 1045 06/15/17 1058  BP:  (!) 152/47    Pulse: (!) 111 (!) 113 (!) 110   Resp: (!) 21 (!) 26 (!) 25   Temp:      TempSrc:      SpO2: 98% 100% 98%   Weight:    180 lb 12.4 oz (82 kg)  Height:       No intake or output data in the 24 hours ending 06/15/17 1105 Filed Weights   06/15/17 0415 06/15/17 1058  Weight: 185 lb (83.9  kg) 180 lb 12.4 oz (82 kg)   Body mass index is 26.7 kg/m.  General:  Well nourished, well developed, in moderate respiratory distress HEENT: normal Lymph: no adenopathy Neck: JVD 10 cm, +HJR Endocrine:  No thryomegaly Vascular: No carotid bruits; FA pulses 2+ bilaterally without bruits  Cardiac:  normal S1, S2; RRR; no murmur Lungs: decreased BS bases bilaterally, no wheezing, rhonchi, some rales  Abd: soft, nontender, no hepatomegaly, protuberant Ext: 1+ edema Musculoskeletal:  No deformities, BUE and BLE strength normal and equal Skin: warm and dry  Neuro:  CNs 2-12 intact, no focal abnormalities noted Psych:  Normal affect  EKG:  The EKG was personally reviewed and demonstrates ST, HR 120 w/ PVCs  Relevant CV Studies: 06/14 Pacer ck, Afib 43% of the time  ECHO 12/05/2016 - Left ventricle: The cavity size was normal. There was mild focal   basal hypertrophy of the septum. Systolic function was normal.   The estimated ejection fraction was in the range of 50% to 55%.   Wall motion was normal; there were no regional wall motion   abnormalities. Left ventricular diastolic function parameters   were normal. - Aortic valve: Trileaflet; mildly thickened, mildly calcified   leaflets. - Aorta: Aortic root dimension: 39 mm (ED). - Aortic root: The aortic root was mildly dilated. - Mitral valve: Minimal focal calcification, with moderate   involvement of chords. There was mild regurgitation. - Right ventricle: The cavity size was mildly dilated. Wall   thickness was normal. Systolic function was moderately reduced. - Right atrium: The atrium was massively dilated. - Tricuspid valve: There was moderate regurgitation. - Pulmonary arteries: PA peak pressure: 45 mm Hg (S). Impressions: - The right ventricular systolic pressure was increased consistent   with moderate pulmonary hypertension.  Laboratory Data:  Chemistry  Recent Labs Lab 06/15/17 0426  NA 137  K 3.9  CL  102  CO2 24  GLUCOSE 130*  BUN 16  CREATININE 1.42*  CALCIUM 8.8*  GFRNONAA 43*  GFRAA 50*  ANIONGAP 11    No results for input(s): PROT, ALBUMIN, AST, ALT, ALKPHOS, BILITOT in the last 168 hours. Hematology  Recent Labs Lab 06/15/17 0426  WBC 6.8  RBC 3.61*  HGB 11.6*  HCT 36.1*  MCV 100.0  MCH 32.1  MCHC 32.1  RDW 12.7  PLT 139*    Recent Labs Lab 06/15/17 0439 06/15/17 0927  TROPIPOC 0.01 0.01    BNP  Recent Labs Lab 06/15/17 0426  BNP 343.0*     Radiology/Studies:  Dg Chest 2 View Result Date: 06/15/2017 CLINICAL DATA:  Acute onset of worsening shortness of breath. Initial encounter. EXAM: CHEST  2 VIEW COMPARISON:  Chest radiograph performed 12/02/2016 FINDINGS: The lungs are well-aerated. Minimal left basilar atelectasis is noted. There is no evidence of pleural effusion or pneumothorax. The heart is normal in size; the patient is status post median sternotomy. A pacemaker is noted at the left chest wall, with leads ending at the right atrium and right ventricle. No acute osseous abnormalities are seen. IMPRESSION: Minimal left basilar atelectasis noted.  Lungs otherwise clear. Electronically Signed   By: Garald Balding M.D.   On: 06/15/2017 06:21    Assessment and Plan:   1. Acute on chronic diastolic CHF: pt has volume overload by exam. He describes increased abd size, LE edema. BNP is up some, CXR does not show the fluid. - However, still feel he needs diuresis. He got SL NTG x 1 and SBP dropped 90s, so was given 500 cc fluid.  - feel IV can be saline locked, do not think he needs ongoing IVF. - will give Lasix 40 mg x 1 dose, further diuresis per MD.  2. COPD: also significant. Believe COPD is the reason he tolerated CHF poorly.  - per IM   3. PAF: SR now, follow on telemetry  Otherwise, per IM Principal Problem:   Acute respiratory failure with hypoxia ([place]) Active Problems:   Paroxysmal atrial flutter ([place])   Pacemaker- MDT 02/04/14    Hyperlipidemia   Monoclonal gammopathy of unknown significance (MGUS)   COPD exacerbation (HCC)   Anemia  of chronic disease   CKD (chronic kidney disease) stage 3, GFR 30-59 ml/min   Acute coronary syndromes (HCC)      Signed, Barrett, Suanne Marker, PA-C  06/15/2017 11:05 AM  Agree with note by Rosaria Ferries PA-C  [name] is a patient of Dr. Evette Georges who we are asked to see because of progressive shortness of breath and PND. He is accompanied by his daughter today from [place]. He lives with one of his daughters as well. He is an 72 year old hard of hearing Caucasian male with a history of CAD status post CABG in 2006 (LIMA to the LAD, vein to circumflex, intermediate, obtuse marginal branch, low risk Myoview 9/17), sick sinus syndrome status post permanent chest is pacemaker placed in 2015, PAF on Eliquis and COPD with remote tobacco abuse. He has had 2-3 weeks of increasing shortness of breath, dyspnea and PND as well as lower extremity edema. He did see his primary care provider, Cyndi Bender PA-C who began an oral diuretic as well as Bernerd Pho PA-C in our office a week ago. He denies chest pain. On exam, his lungs are clear. He does have 1-2+ pitting edema with increased jugular venous pressure. His EKG shows sinus tachycardia and chest x-ray does not show pulmonary edema. His BNP is only elevated to 300. Clinically he does have volume overload/CHF. He did have a 2-D echo performed 12/05/16 revealed normal EF. We will repeat this. We'll continue diuresing with IV diuretics. We'll base further recommendations based on the results of the echocardiogram.   Lorretta Harp, M.D., [place], [facility], [place], [place] [address] [place], [place]  [address]  [phone] 06/15/2017 11:35 AM

## 2017-06-15 NOTE — ED Notes (Signed)
Called pharmacy to verify  lasix.

## 2017-06-15 NOTE — ED Notes (Signed)
Report given.

## 2017-06-15 NOTE — ED Notes (Signed)
Patient BP TWO NITRO 93.    EDP and admitting aware, Fluid blous give.    Admitting at bedside,

## 2017-06-15 NOTE — ED Provider Notes (Signed)
Patient is in the emergency department and has been admitted. He was awaiting bed placement. Nursing staff notified me that he acutely developed chest pain and shortness of breath in the obtained EKG. EKG has been shown to me which shows lateral ST depression that is increased from previous EKG. I have assessed the patient and he has tachypnea and appears to be in pain, he is complaining of pressure and tightness over his anterior chest. Heart is regular but tachycardic. Lungs are grossly clear. Patient's mental status is clear. At this time, with EKG changes of lateral depression consistent with ischemia and ischemic type chest pain, I have proceeded with ordering nitroglycerin, heparin and cardiology consultation. Findings now are suggestive of ACS\NSTEMI. CRITICAL CARE Performed by: Charlesetta Shanks   Total critical care time: 30 minutes  Critical care time was exclusive of separately billable procedures and treating other patients.  Critical care was necessary to treat or prevent imminent or life-threatening deterioration.  Critical care was time spent personally by me on the following activities: development of treatment plan with patient and/or surrogate as well as nursing, discussions with consultants, evaluation of patient's response to treatment, examination of patient, obtaining history from patient or surrogate, ordering and performing treatments and interventions, ordering and review of laboratory studies, ordering and review of radiographic studies, pulse oximetry and re-evaluation of patient's condition.  Consult placed to cardiology 09:48. They will evaluate. Admitting Medical team is also seeing the patient and will continue with plan for admission.  Patient has gotten improvement of pain with nitroglycerin.   Charlesetta Shanks, MD 06/15/17 [phone]

## 2017-06-15 NOTE — Progress Notes (Addendum)
ANTICOAGULATION CONSULT NOTE  Pharmacy Consult:  Heparin Indication: chest pain/ACS + history of Afib  No Known Allergies  Patient Measurements: Height: 5\' 8"  (172.7 cm) Weight: 187 lb 6.3 oz (85 kg) IBW/kg (Calculated) : 68.4 Heparin Dosing Weight: 84  Vital Signs: BP: 127/51 (06/17 1639) Pulse Rate: 115 (06/17 1500)  Labs:  Recent Labs  06/15/17 0426 06/15/17 0946 06/15/17 1532 06/15/17 1943  HGB 11.6*  --   --   --   HCT 36.1*  --   --   --   PLT 139*  --   --   --   APTT  --  40*  --  136*  HEPARINUNFRC  --   --   --  >2.20*  CREATININE 1.42*  --   --   --   TROPONINI  --  <0.03 <0.03 <0.03    Estimated Creatinine Clearance: 40.3 mL/min (A) (by C-G formula based on SCr of 1.42 mg/dL (H)).   Assessment: 54 YOM with history of Afib on Eliquis PTA, last dose possibly on 06/14/17 PM per family/RN.  Pharmacy consulted to initiate IV heparin for ACS.   Initial heparin level >2.2 (anticipated d/t effect of Eliquis), aPTT also elevated at 136 seconds. No bleeding noted.   Goal of Therapy:  Heparin level 0.3-0.7 units/ml aPTT 66 - 102 seconds Monitor platelets by anticoagulation protocol: Yes    Plan:  - Decrease heparin to 1050 units/hr - Next heparin level and aPTT with AM labs - Daily heparin level, aPTT, and CBC  - Follow cardiology plans  Jasani Dolney D. Keyvon Herter, PharmD, BCPS Clinical Pharmacist 06/15/2017 8:53 PM

## 2017-06-15 NOTE — ED Notes (Signed)
Cardiology at bedside.

## 2017-06-16 ENCOUNTER — Inpatient Hospital Stay (HOSPITAL_COMMUNITY): Payer: Medicare Other

## 2017-06-16 ENCOUNTER — Telehealth: Payer: Self-pay | Admitting: Emergency Medicine

## 2017-06-16 ENCOUNTER — Other Ambulatory Visit (HOSPITAL_COMMUNITY): Payer: Medicare Other

## 2017-06-16 ENCOUNTER — Encounter (HOSPITAL_COMMUNITY): Payer: Medicare Other

## 2017-06-16 DIAGNOSIS — D631 Anemia in chronic kidney disease: Secondary | ICD-10-CM

## 2017-06-16 DIAGNOSIS — I360 Nonrheumatic tricuspid (valve) stenosis: Secondary | ICD-10-CM

## 2017-06-16 DIAGNOSIS — J449 Chronic obstructive pulmonary disease, unspecified: Secondary | ICD-10-CM

## 2017-06-16 DIAGNOSIS — Z87891 Personal history of nicotine dependence: Secondary | ICD-10-CM

## 2017-06-16 DIAGNOSIS — M79609 Pain in unspecified limb: Secondary | ICD-10-CM

## 2017-06-16 DIAGNOSIS — Z8249 Family history of ischemic heart disease and other diseases of the circulatory system: Secondary | ICD-10-CM

## 2017-06-16 DIAGNOSIS — Z7901 Long term (current) use of anticoagulants: Secondary | ICD-10-CM

## 2017-06-16 DIAGNOSIS — D472 Monoclonal gammopathy: Secondary | ICD-10-CM

## 2017-06-16 DIAGNOSIS — E785 Hyperlipidemia, unspecified: Secondary | ICD-10-CM

## 2017-06-16 DIAGNOSIS — Z79899 Other long term (current) drug therapy: Secondary | ICD-10-CM

## 2017-06-16 DIAGNOSIS — Z836 Family history of other diseases of the respiratory system: Secondary | ICD-10-CM

## 2017-06-16 DIAGNOSIS — D638 Anemia in other chronic diseases classified elsewhere: Secondary | ICD-10-CM

## 2017-06-16 DIAGNOSIS — I5031 Acute diastolic (congestive) heart failure: Secondary | ICD-10-CM

## 2017-06-16 DIAGNOSIS — M7989 Other specified soft tissue disorders: Secondary | ICD-10-CM

## 2017-06-16 DIAGNOSIS — I071 Rheumatic tricuspid insufficiency: Secondary | ICD-10-CM

## 2017-06-16 DIAGNOSIS — I5022 Chronic systolic (congestive) heart failure: Secondary | ICD-10-CM

## 2017-06-16 DIAGNOSIS — Z951 Presence of aortocoronary bypass graft: Secondary | ICD-10-CM

## 2017-06-16 DIAGNOSIS — I480 Paroxysmal atrial fibrillation: Secondary | ICD-10-CM

## 2017-06-16 DIAGNOSIS — I951 Orthostatic hypotension: Secondary | ICD-10-CM

## 2017-06-16 DIAGNOSIS — I2510 Atherosclerotic heart disease of native coronary artery without angina pectoris: Secondary | ICD-10-CM

## 2017-06-16 DIAGNOSIS — N183 Chronic kidney disease, stage 3 (moderate): Secondary | ICD-10-CM

## 2017-06-16 LAB — BASIC METABOLIC PANEL
ANION GAP: 10 (ref 5–15)
BUN: 30 mg/dL — ABNORMAL HIGH (ref 6–20)
CALCIUM: 9.1 mg/dL (ref 8.9–10.3)
CO2: 25 mmol/L (ref 22–32)
Chloride: 103 mmol/L (ref 101–111)
Creatinine, Ser: 1.77 mg/dL — ABNORMAL HIGH (ref 0.61–1.24)
GFR calc Af Amer: 39 mL/min — ABNORMAL LOW (ref >60)
GFR, EST NON AFRICAN AMERICAN: 33 mL/min — AB (ref >60)
Glucose, Bld: 137 mg/dL — ABNORMAL HIGH (ref 65–99)
POTASSIUM: 4.8 mmol/L (ref 3.5–5.1)
Sodium: 138 mmol/L (ref 135–145)

## 2017-06-16 LAB — ECHOCARDIOGRAM COMPLETE
HEIGHTINCHES: 68 in
WEIGHTICAEL: 2920 [oz_av]

## 2017-06-16 LAB — HEPARIN LEVEL (UNFRACTIONATED): Heparin Unfractionated: >2.2 IU/mL — ABNORMAL HIGH (ref 0.30–0.70)

## 2017-06-16 LAB — APTT: aPTT: 141 seconds — ABNORMAL HIGH (ref 24–36)

## 2017-06-16 MED ORDER — HEPARIN (PORCINE) IN NACL 100-0.45 UNIT/ML-% IJ SOLN: 850.0000 [IU]/h | INTRAMUSCULAR | Status: DC

## 2017-06-16 MED ORDER — APIXABAN 5 MG PO TABS: 5.0000 mg | ORAL_TABLET | Freq: Two times a day (BID) | ORAL | Status: DC

## 2017-06-16 MED ORDER — APIXABAN 5 MG PO TABS
5.0000 mg | ORAL_TABLET | Freq: Two times a day (BID) | ORAL | Status: DC
  Administered 2017-06-16 – 2017-06-17 (×3): 5 mg via ORAL
  Filled 2017-06-16 (×3): qty 1

## 2017-06-16 NOTE — Progress Notes (Signed)
Progress Note  Patient Name: [name] Date of Encounter: 06/16/2017  Primary Cardiologist: Dr Claiborne Billings Primary Electrophysiologist:  Dr Sallyanne Kuster  Subjective   Denies any chest pain, complained mainly DOE, but significantly improved  Inpatient Medications    Scheduled Meds: . apixaban  5 mg Oral BID  . atorvastatin  40 mg Oral Daily  . digoxin  0.125 mg Oral Daily  . furosemide  20 mg Oral Daily  . ipratropium-albuterol  3 mL Nebulization TID  . metoprolol tartrate  75 mg Oral BID  . midodrine  5 mg Oral TID WC  . predniSONE  40 mg Oral Q breakfast  . tamsulosin  0.4 mg Oral QHS   Continuous Infusions:  PRN Meds: acetaminophen **OR** acetaminophen, albuterol, nitroGLYCERIN, nitroGLYCERIN   Vital Signs    Vitals:   06/15/17 2100 06/16/17 0521 06/16/17 0805 06/16/17 0858  BP: (!) 122/54 (!) 126/52 (!) 124/54   Pulse: (!) 102  74 91  Resp: 18  19   Temp: 98.5 F (36.9 C) 98.9 F (37.2 C) 98.4 F (36.9 C)   TempSrc: Oral Oral Oral   SpO2: 98% 98% 99%   Weight:  182 lb 8 oz (82.8 kg)    Height:        Intake/Output Summary (Last 24 hours) at 06/16/17 0915 Last data filed at 06/16/17 0500  Gross per 24 hour  Intake            545.1 ml  Output              450 ml  Net             95.1 ml   Filed Weights   06/15/17 1058 06/15/17 1535 06/16/17 0521  Weight: 180 lb 12.4 oz (82 kg) 187 lb 6.3 oz (85 kg) 182 lb 8 oz (82.8 kg)    Telemetry    NSR with SVT vs recurrent afib RVR last night. - Personally Reviewed  ECG    Sinus tach vs recurrent afib, ST depression in lateral leads - Personally Reviewed  Physical Exam   GEN: No acute distress.   Neck: No JVD Cardiac: RRR, no murmurs, rubs, or gallops.  Respiratory: Clear to auscultation bilaterally. GI: Soft, nontender, non-distended  MS: No edema; No deformity. Neuro:  Nonfocal  Psych: Normal affect   Labs    Chemistry Recent Labs Lab 06/15/17 0426 06/16/17 0338  NA 137 138  K 3.9 4.8    CL 102 103  CO2 24 25  GLUCOSE 130* 137*  BUN 16 30*  CREATININE 1.42* 1.77*  CALCIUM 8.8* 9.1  GFRNONAA 43* 33*  GFRAA 50* 39*  ANIONGAP 11 10     Hematology Recent Labs Lab 06/15/17 0426  WBC 6.8  RBC 3.61*  HGB 11.6*  HCT 36.1*  MCV 100.0  MCH 32.1  MCHC 32.1  RDW 12.7  PLT 139*    Cardiac Enzymes Recent Labs Lab 06/15/17 0946 06/15/17 1532 06/15/17 1943  TROPONINI <0.03 <0.03 <0.03    Recent Labs Lab 06/15/17 0439 06/15/17 0927  TROPIPOC 0.01 0.01     BNP Recent Labs Lab 06/15/17 0426  BNP 343.0*     DDimer No results for input(s): DDIMER in the last 168 hours.   Radiology    Dg Chest 2 View  Result Date: 06/15/2017 CLINICAL DATA:  Acute onset of worsening shortness of breath. Initial encounter. EXAM: CHEST  2 VIEW COMPARISON:  Chest radiograph performed 12/02/2016 FINDINGS: The lungs are well-aerated. Minimal  left basilar atelectasis is noted. There is no evidence of pleural effusion or pneumothorax. The heart is normal in size; the patient is status post median sternotomy. A pacemaker is noted at the left chest wall, with leads ending at the right atrium and right ventricle. No acute osseous abnormalities are seen. IMPRESSION: Minimal left basilar atelectasis noted.  Lungs otherwise clear. Electronically Signed   By: Garald Balding M.D.   On: 06/15/2017 06:21    Cardiac Studies   Echo 12/05/2016 LV EF: 50% -   55%  ------------------------------------------------------------------- Indications:      CAD of native vessels 414.01.  ------------------------------------------------------------------- History:   PMH:  AV block.  Atrial flutter.  Chronic obstructive pulmonary disease.  Risk factors:  Dyslipidemia.  ------------------------------------------------------------------- Study Conclusions  - Left ventricle: The cavity size was normal. There was mild focal   basal hypertrophy of the septum. Systolic function was normal.   The  estimated ejection fraction was in the range of 50% to 55%.   Wall motion was normal; there were no regional wall motion   abnormalities. Left ventricular diastolic function parameters   were normal. - Aortic valve: Trileaflet; mildly thickened, mildly calcified   leaflets. - Aorta: Aortic root dimension: 39 mm (ED). - Aortic root: The aortic root was mildly dilated. - Mitral valve: Minimal focal calcification, with moderate   involvement of chords. There was mild regurgitation. - Right ventricle: The cavity size was mildly dilated. Wall   thickness was normal. Systolic function was moderately reduced. - Right atrium: The atrium was massively dilated. - Tricuspid valve: There was moderate regurgitation. - Pulmonary arteries: PA peak pressure: 45 mm Hg (S).  Impressions:  - The right ventricular systolic pressure was increased consistent   with moderate pulmonary hypertension.  Patient Profile     83 y.o. male with a hx of CAD (s/p CABG in 2006 with LIMA-LAD, SVG-Cx, SVG-IM-OM, low-risk NST in 08/2016), SSS (s/p PPM placement 2015), PAF (on Eliquis), orthostatic hypotension, and COPD who is being seen today for the evaluation of CHF at the request of Dr Beryle Beams.  Assessment & Plan    1. Acute on chronic diastolic heart failure  - he was treated with single dose of 40mg  IV lasix yesterday, renal function worsened. Also despite he says he has been saving his urine for the nurse to measure, he did not have significant urinary output. Weight has not changed much  - he says his breathing is better. Patient states his main concerning was more DOE instead of PND.   - agree with switching to previous 20mg  daily of lasix. Despite JVD, his lung is clear, he is wearing compression stocking and LE edema is not significant. Overall, he do not think he is significantly volume overloaded  - pending echo today. With his recent DOE and frequent recurrent PAF, I would favor try to maximize rate  control first. I will ask nurse to ambulate him today to obtain ambulatory O2  - if no significant O2 drop, may consider discharge today or tomorrow  2. COPD: no obvious wheezing  3. PAF on eliquis: Continue eliquis,  4. SSS s/p PPM: recent PPM check 06/12/2017, 42.3% AT/AF, need better rate control  5. CAD s/p CABG: low risk NST 08/2016, but if dyspnea on exertion persistent, may need repeat as outpatient.  SignedAlmyra Deforest, PA  06/16/2017, 9:15 AM   As above, patient seen and examined. He states his dyspnea is much improved. He denies chest pain. In reviewing his symptoms  I think he has a combination of diastolic congestive heart failure and COPD. His renal function has deteriorated with minimal diuresis. Would continue Lasix 20 mg daily. Continue therapy for COPD. Await follow-up echocardiogram. If symptoms stable he likely can be discharged tomorrow morning and follow up with Dr. Claiborne Billings. Note he is on apixaban 5 mg BID; if Cr stabilizes at >1.5, would need to reduce dose to 2.5 mg BID. DC digoxin. Kirk Ruths, MD

## 2017-06-16 NOTE — Progress Notes (Signed)
Pt ambulated in hall to nurses station and back with standby assist. Pt tolerated well. Oxygen sat remained 92-94% at times 97%. Pt denies increased SOB.

## 2017-06-16 NOTE — Progress Notes (Signed)
   Subjective: Seen and evaluated today at bedside. Feeling well and in good spirits. No more chest pain. Still with some dyspnea but much improved.   Objective:  Vital signs in last 24 hours: Vitals:   06/15/17 2100 06/16/17 0521 06/16/17 0805 06/16/17 0858  BP: (!) 122/54 (!) 126/52 (!) 124/54   Pulse: (!) 102  74 91  Resp: 18  19   Temp: 98.5 F (36.9 C) 98.9 F (37.2 C) 98.4 F (36.9 C)   TempSrc: Oral Oral Oral   SpO2: 98% 98% 99%   Weight:  182 lb 8 oz (82.8 kg)    Height:       General: Very pleasant elderly caucasian male resting comfortably in bed. In no acute distress. Jovial.   HENT: PERRL. EOMI. Oropharynx clear, mucous membranes moist.  Cardiovascular: RRR. No MGR. Pulmonary: CTA BL. Good airflow. Breathing unlabored. Able to speak in complete sentences. Abdomen: Soft, non-tender and non-distended. +bowel sounds.  Extremities: Trace BL LE edema. Ted hose in place. No TTP left posterior knee.  Skin: Warm, dry. No cyanosis.  Psych: Mood normal and affect was mood congruent. Responds to questions appropriately.   Assessment/Plan:  Principal Problem:   Acute respiratory failure with hypoxia (HCC) Active Problems:   Paroxysmal atrial flutter (HCC)   Pacemaker- MDT 02/04/14   Hyperlipidemia   Monoclonal gammopathy of unknown significance (MGUS)   COPD exacerbation (HCC)   Anemia of chronic disease   CKD (chronic kidney disease) stage 3, GFR 30-59 ml/min   Acute coronary syndromes ([place])  # Acute Respiratory Failure with Hypoxia ## COPD Exacerbation ### Acute on Chronic Combined Systolic and Diastolic CHF No more chest pain overnight. Dyspnea present but much improved. Ambulated on RA without hypoxia. He diuresed well with one time dose of IV lasix 40 however developed worsening renal function. Believe multifactorial due to COPD exacerbation + exacerbation of chronic systolic HF. Cardiology on, in agreement. Recommend Lasix 20 mg daily and d/c tomorrow AM if symptoms  stable.  -Transient EKG changes likely from transient demand, Trops neg x3 -FU echo -Lasix 20 mg daily -Pred 40 mg daily x5 days -Duonebs Q8H  #BL LE Edema Much improved with diuresis, compression hose and elevation. No TTP of posterior knee today. Will get doppler today to r/o DVT although seems less likely now.  -FU doppler -Lasix as above -Compression stockings, LE elevation  #PAF ##SSS, s/p PPM Transitioned Recent PPM interrogation with 42% AT/AF, would benefit from better rate control. On Metop 75 mg BID. Will continue as rates currently controlled. To be addressed at cardiology FU.  -Continue Dig 0.125mg  daily -Currently on Eliquis 5mg . Will need to consider decrease should renal function not normalize  #Acute on Chronic Renal Insufficiency Cr 1.77 today, 1.4 on admission and appears to have BL Cr of 1.1. Lasix already given this AM. -BMET in AM  Dispo: Anticipated discharge in approximately 1 day(s).   Lynleigh Kovack, DO 06/16/2017, 11:23 AM Pager: [phone]

## 2017-06-16 NOTE — Progress Notes (Signed)
  Echocardiogram 2D Echocardiogram has been performed.  Thomasina Housley T Keitha Kolk 06/16/2017, 1:10 PM

## 2017-06-16 NOTE — Progress Notes (Signed)
VASCULAR LAB PRELIMINARY  PRELIMINARY  PRELIMINARY  PRELIMINARY  Left lower extremity venous duplex completed.    Preliminary report:  Left:  No evidence of DVT, superficial thrombosis, or Baker's cyst.  Anjolina Byrer, RVS 06/16/2017, 11:51 AM

## 2017-06-16 NOTE — Progress Notes (Signed)
ANTICOAGULATION CONSULT NOTE  Pharmacy Consult:  Heparin Indication: chest pain/ACS + history of Afib  No Known Allergies  Patient Measurements: Height: 5\' 8"  (172.7 cm) Weight: 182 lb 8 oz (82.8 kg) IBW/kg (Calculated) : 68.4 Heparin Dosing Weight: 84  Vital Signs: Temp: 98.9 F (37.2 C) (06/18 0521) Temp Source: Oral (06/18 0521) BP: 126/52 (06/18 0521) Pulse Rate: 102 (06/17 2100)  Labs:  Recent Labs  06/15/17 0426 06/15/17 0946 06/15/17 1532 06/15/17 1943 06/16/17 0338  HGB 11.6*  --   --   --   --   HCT 36.1*  --   --   --   --   PLT 139*  --   --   --   --   APTT  --  40*  --  136* 141*  HEPARINUNFRC  --   --   --  >2.20* >2.20*  CREATININE 1.42*  --   --   --  1.77*  TROPONINI  --  <0.03 <0.03 <0.03  --     Estimated Creatinine Clearance: 32 mL/min (A) (by C-G formula based on SCr of 1.77 mg/dL (H)).   Assessment: 67 YOM with history of Afib on Eliquis PTA, last dose possibly on 06/14/17 PM per family/RN.  Pharmacy consulted to initiate IV heparin for ACS.   Heparin level remains >2.2 (d/t effect of Eliquis), aPTT also elevated at 141 seconds. No bleeding noted.  Goal of Therapy:  Heparin level 0.3-0.7 units/ml aPTT 66 - 102 seconds Monitor platelets by anticoagulation protocol: Yes   Plan:  Hold heparin x 1 hour Decrease heparin to 850 units/hr Will f/u 8hr PTT  Sherlon Handing, PharmD, BCPS Clinical pharmacist, pager [phone] 06/16/2017 6:07 AM

## 2017-06-16 NOTE — Telephone Encounter (Signed)
Noted. Will make sure that RB is made aware of this.

## 2017-06-16 NOTE — Evaluation (Signed)
Physical Therapy Evaluation Patient Details Name: [name] MRN: [id number] DOB: [birth year] Today's Date: 06/16/2017   History of Present Illness  Pt adm with acute respiratory failure with hypoxia. PMH - CHF, copd, cabg ckd, pacer asthma  Clinical Impression  Pt doing well with mobility and no further PT needed.  Ready for dc from PT standpoint.      Follow Up Recommendations No PT follow up    Equipment Recommendations  None recommended by PT    Recommendations for Other Services       Precautions / Restrictions Precautions Precautions: None Restrictions Weight Bearing Restrictions: No      Mobility  Bed Mobility Overal bed mobility: Independent                Transfers Overall transfer level: Independent Equipment used: None                Ambulation/Gait Ambulation/Gait assistance: Modified independent (Device/Increase time) Ambulation Distance (Feet): 225 Feet Assistive device: Rolling walker (2 wheeled);None Gait Pattern/deviations: Step-through pattern;Decreased stride length   Gait velocity interpretation: at or above normal speed for age/gender General Gait Details: Steady gait with or without walker. SpO2 >95% on RA. HR stable  Stairs            Wheelchair Mobility    Modified Rankin (Stroke Patients Only)       Balance                                             Pertinent Vitals/Pain Pain Assessment: No/denies pain    Home Living Family/patient expects to be discharged to:: Private residence Living Arrangements: Children Available Help at Discharge: Family;Available PRN/intermittently Type of Home: House Home Access: Stairs to enter Entrance Stairs-Rails: Psychiatric nurse of Steps: 3 Home Layout: One level Home Equipment: Shower seat;Cane - single point;Walker - 2 wheels      Prior Function Level of Independence: Independent with assistive device(s)          Comments: Uses cane or walker at times     Hand Dominance   Dominant Hand: Right    Extremity/Trunk Assessment   Upper Extremity Assessment Upper Extremity Assessment: Overall WFL for tasks assessed    Lower Extremity Assessment Lower Extremity Assessment: Overall WFL for tasks assessed       Communication   Communication: HOH  Cognition Arousal/Alertness: Awake/alert Behavior During Therapy: WFL for tasks assessed/performed Overall Cognitive Status: Within Functional Limits for tasks assessed                                        General Comments      Exercises     Assessment/Plan    PT Assessment Patent does not need any further PT services  PT Problem List         PT Treatment Interventions      PT Goals (Current goals can be found in the Care Plan section)  Acute Rehab PT Goals PT Goal Formulation: All assessment and education complete, DC therapy    Frequency     Barriers to discharge        Co-evaluation               AM-PAC PT "6 Clicks" Daily Activity  Outcome Measure Difficulty  turning over in bed (including adjusting bedclothes, sheets and blankets)?: None Difficulty moving from lying on back to sitting on the side of the bed? : None Difficulty sitting down on and standing up from a chair with arms (e.g., wheelchair, bedside commode, etc,.)?: None Help needed moving to and from a bed to chair (including a wheelchair)?: None Help needed walking in hospital room?: None Help needed climbing 3-5 steps with a railing? : None 6 Click Score: 24    End of Session   Activity Tolerance: Patient tolerated treatment well Patient left: in bed;with call bell/phone within reach;with nursing/sitter in room Nurse Communication: Mobility status PT Visit Diagnosis: Unsteadiness on feet (R26.81)    Time: 4235-3614 PT Time Calculation (min) (ACUTE ONLY): 10 min   Charges:   PT Evaluation $PT Eval Low Complexity: 1 Procedure      PT G CodesMarland Kitchen        [facility] PT [place] 06/16/2017, 3:46 PM

## 2017-06-16 NOTE — Progress Notes (Signed)
PT Cancellation Note  Patient Details Name: [name] MRN: [id number] DOB: 27-Mar-1929   Cancelled Treatment:    Reason Eval/Treat Not Completed: Patient at procedure or test/unavailable Per RN, pt is out of room for doppler testing and possibly echocardiogram. Will follow up as schedule allows.   Leighton Ruff, PT, DPT  Acute Rehabilitation Services  Pager: [phone]    Rudean Hitt 06/16/2017, 11:40 AM

## 2017-06-17 ENCOUNTER — Other Ambulatory Visit: Payer: Self-pay | Admitting: *Deleted

## 2017-06-17 ENCOUNTER — Encounter (HOSPITAL_COMMUNITY): Payer: Self-pay | Admitting: General Practice

## 2017-06-17 DIAGNOSIS — I50813 Acute on chronic right heart failure: Secondary | ICD-10-CM

## 2017-06-17 DIAGNOSIS — I5033 Acute on chronic diastolic (congestive) heart failure: Secondary | ICD-10-CM

## 2017-06-17 DIAGNOSIS — I272 Pulmonary hypertension, unspecified: Secondary | ICD-10-CM

## 2017-06-17 DIAGNOSIS — I249 Acute ischemic heart disease, unspecified: Secondary | ICD-10-CM

## 2017-06-17 LAB — CBC
HCT: 32.6 % — ABNORMAL LOW (ref 39.0–52.0)
HEMOGLOBIN: 10.4 g/dL — AB (ref 13.0–17.0)
MCH: 32.2 pg (ref 26.0–34.0)
MCHC: 31.9 g/dL (ref 30.0–36.0)
MCV: 100.9 fL — ABNORMAL HIGH (ref 78.0–100.0)
Platelets: 127 10*3/uL — ABNORMAL LOW (ref 150–400)
RBC: 3.23 MIL/uL — ABNORMAL LOW (ref 4.22–5.81)
RDW: 13 % (ref 11.5–15.5)
WBC: 9.7 10*3/uL (ref 4.0–10.5)

## 2017-06-17 LAB — BASIC METABOLIC PANEL
Anion gap: 8 (ref 5–15)
BUN: 37 mg/dL — AB (ref 6–20)
CHLORIDE: 106 mmol/L (ref 101–111)
CO2: 27 mmol/L (ref 22–32)
CREATININE: 1.48 mg/dL — AB (ref 0.61–1.24)
Calcium: 8.9 mg/dL (ref 8.9–10.3)
GFR calc Af Amer: 48 mL/min — ABNORMAL LOW (ref >60)
GFR calc non Af Amer: 41 mL/min — ABNORMAL LOW (ref >60)
Glucose, Bld: 105 mg/dL — ABNORMAL HIGH (ref 65–99)
Potassium: 4 mmol/L (ref 3.5–5.1)
SODIUM: 141 mmol/L (ref 135–145)

## 2017-06-17 MED ORDER — FUROSEMIDE 40 MG PO TABS: 40.0000 mg | ORAL_TABLET | Freq: Every day | ORAL | 1 refills | Status: DC

## 2017-06-17 MED ORDER — PREDNISONE 20 MG PO TABS: 40.0000 mg | ORAL_TABLET | Freq: Every day | ORAL | 0 refills | Status: AC

## 2017-06-17 MED ORDER — MIDODRINE HCL 5 MG PO TABS: ORAL_TABLET | ORAL | 4 refills | Status: DC

## 2017-06-17 NOTE — Discharge Summary (Signed)
Name: [name] MRN: [id number] DOB: Sep 22, 1929 83 y.o. PCP: Cyndi Bender, PA-C  Date of Admission: 06/15/2017  4:14 AM Date of Discharge: 06/17/2017 Attending Physician: Annia [place], MD  Discharge Diagnosis: 1. Acute respiratory failure with hypoxia 2. Acute on chronic diastolic congestive heart failure 3. COPD exacerbation 4. Left lower extremity swelling  Principal Problem:   Acute respiratory failure with hypoxia (HCC) Active Problems:   Paroxysmal atrial flutter (HCC)   Pacemaker- MDT 02/04/14   Hyperlipidemia   Monoclonal gammopathy of unknown significance (MGUS)   COPD exacerbation (HCC)   Anemia of chronic disease   CKD (chronic kidney disease) stage 3, GFR 30-59 ml/min   Acute coronary syndromes (HCC)   Acute on chronic right-sided heart failure ([place])   Pulmonary hypertension ([place])  Discharge Medications: Allergies as of 06/17/2017   No Known Allergies     Medication List    TAKE these medications   albuterol 108 (90 Base) MCG/ACT inhaler Commonly known as:  PROVENTIL HFA;VENTOLIN HFA Inhale 2 puffs into the lungs every 4 (four) hours as needed for wheezing or shortness of breath.   apixaban 5 MG Tabs tablet Commonly known as:  ELIQUIS Take 1 tablet (5 mg total) by mouth every 12 (twelve) hours.   atorvastatin 40 MG tablet Commonly known as:  LIPITOR Take 1 tablet (40 mg total) by mouth daily. PLEASE CONTACT OFFICE FOR ADDITIONAL REFILLS   CITRUCEL 500 MG Tabs Generic drug:  Methylcellulose (Laxative) Take 1,000 mg by mouth at bedtime.   [facility] SENIOR PROBIOTIC PO Take 1 capsule by mouth at bedtime. Once daily   digoxin 0.125 MG tablet Commonly known as:  LANOXIN Take 1 tablet (0.125 mg total) by mouth daily.   diphenhydramine-acetaminophen 25-500 MG Tabs tablet Commonly known as:  TYLENOL PM Take 2 tablets by mouth at bedtime.   furosemide 40 MG tablet Commonly known as:  LASIX Take 1 tablet (40 mg total) by mouth daily. Start  taking on:  06/18/2017 What changed:  medication strength  how much to take   metoprolol tartrate 50 MG tablet Commonly known as:  LOPRESSOR Take 75 mg by mouth 2 (two) times daily.   midodrine 5 MG tablet Commonly known as:  PROAMATINE TAKE 1 TABLET (5 MG TOTAL) BY MOUTH 3 (THREE) TIMES DAILY.   niacin 500 MG CR tablet Commonly known as:  NIASPAN TAKE 1 TABLET (500 MG TOTAL) BY MOUTH AT BEDTIME.   predniSONE 20 MG tablet Commonly known as:  DELTASONE Take 2 tablets (40 mg total) by mouth daily with breakfast. Start taking on:  06/19/2017   tamsulosin 0.4 MG Caps capsule Commonly known as:  FLOMAX Take 0.4 mg by mouth at bedtime.   Tiotropium Bromide-Olodaterol 2.5-2.5 MCG/ACT Aers Commonly known as:  STIOLTO RESPIMAT Inhale 2 puffs into the lungs daily.       Disposition and follow-up:   Mr.[name] was discharged from [facility] in Stable condition.  At the hospital follow up visit please address:  1.  Respiratory status/CHF/COPD: Ensure compliance with increased Lasix dose, was prescribed 40 mg once daily at time of discharge. His discharge weight was 181 pounds and looked euvolemic.  2.  Labs / imaging needed at time of follow-up: BMET  3.  Pending labs/ test needing follow-up: None  Follow-up Appointments: Follow-up Information    Cyndi Bender, PA-C. Schedule an appointment as soon as possible for a visit in 1 week(s).   Specialty:  Physician Assistant Contact information: [phone]  [place] [address] [phone]        Troy Sine, MD. Schedule an appointment as soon as possible for a visit in 2 week(s).   Specialty:  Cardiology Contact information: [address] [place] [address] [phone]        Collene Gobble, MD. Schedule an appointment as soon as possible for a visit in 2 day(s).   Specialty:  Pulmonary Disease Contact information: [address] [place]  [address] [phone]           Hospital Course by problem list: Principal Problem:   Acute respiratory failure with hypoxia [facility]) Active Problems:   Paroxysmal atrial flutter (HCC)   Pacemaker- MDT 02/04/14   Hyperlipidemia   Monoclonal gammopathy of unknown significance (MGUS)   COPD exacerbation (HCC)   Anemia of chronic disease   CKD (chronic kidney disease) stage 3, GFR 30-59 ml/min   Acute coronary syndromes (HCC)   Acute on chronic right-sided heart failure (HCC)   Pulmonary hypertension (HCC)   #Acute respiratory failure with hypoxia #Diastolic CHF exacerbation #Pulmonary hypertension #COPD exacerbation 83 year old male with extensive medical history comes in with a two-month history of progressive dyspnea on exertion with acute exacerbation the day of admission. He endorsed paroxysmal nocturnal dyspnea, orthopnea as well as increasing lower extremity edema. He was seen by his outpatient physician who started him on Lasix 20 mg daily with improvement of the swelling however dyspnea remained. On day of admission he reports using his rescue inhaler up to every 30 minutes without relief. On presentation to the ED his chest x-ray was clear. HEENT nonspecific T-wave inversions on EKG without elevated troponins. He was treated with IV Lasix, steroids, scheduled breathing treatments with prompt improvement of his symptoms. He ambulated without hypoxia on room air prior to discharge. He was discharged home with increased Lasix 40 mg once daily and close follow-up with his cardiologist and pulmonologist.  #Left lower extremity swelling Patient presented with bilateral lower extremity edema, left greater than right. This in the setting with decreased mobility secondary to knee osteoarthritis prompted Doppler evaluation which did not reveal DVT. Left lower extremity swelling has nearly completely resolved at time of discharge.  Discharge Vitals:   BP 137/70 (BP Location: Right Arm)   Pulse  70   Temp 98.2 F (36.8 C) (Oral)   Resp 17   Ht 5\' 8"  (1.727 m)   Wt 181 lb 4.8 oz (82.2 kg)   SpO2 97%   BMI 27.57 kg/m   Pertinent Labs, Studies, and Procedures:  Chest x-ray: Without infiltrate, edema or effusion Echo 06/16/17: LVEF 60-65% with grade 2 diastolic dysfunction Left lower extremity Doppler: Without evidence for DVT  Discharge Instructions: Discharge Instructions    (HEART FAILURE PATIENTS) Call MD:  Anytime you have any of the following symptoms: 1) 3 pound weight gain in 24 hours or 5 pounds in 1 week 2) shortness of breath, with or without a dry hacking cough 3) swelling in the hands, feet or stomach 4) if you have to sleep on extra pillows at night in order to breathe.    Complete by:  As directed    Call MD for:  difficulty breathing, headache or visual disturbances    Complete by:  As directed    Call MD for:  extreme fatigue    Complete by:  As directed    Call MD for:  hives    Complete by:  As directed  Call MD for:  persistant dizziness or light-headedness    Complete by:  As directed    Call MD for:  persistant nausea and vomiting    Complete by:  As directed    Call MD for:  severe uncontrolled pain    Complete by:  As directed    Call MD for:  temperature >100.4    Complete by:  As directed    Diet - low sodium heart healthy    Complete by:  As directed    Discharge instructions    Complete by:  As directed    Assess respiratory and volume status. Monitor renal function and adjust Lasix as needed.   Increase activity slowly    Complete by:  As directed       Signed: Zada Finders, MD 06/17/2017, 2:17 PM

## 2017-06-17 NOTE — Discharge Summary (Signed)
Medicine attending discharge note: I personally examined this patient on the day of discharge and I attest to  the accuracy of the discharge evaluation and plan as recorded in the final progress note by resident physician Dr. Einar Gip.  83 year old man with known coronary artery disease status post previous bypass surgery, sick sinus syndrome status post permanent pacemaker placement, paroxysmal atrial fibrillation on chronic anticoagulation, and an unspecified obstructive airway problem in a man who stopped smoking 60 years ago.  He presented with the indolent onset of increasing dyspnea worse in the few days prior to admission without any signs or symptoms of recent infection but with a prodrome of PND, orthopnea, and increasing lower extremity edema.  While under evaluation in the emergency department, he developed chest pain which was relieved by nitroglycerin.  He had nonspecific T-wave inversions on EKG.  No elevation of troponin levels.  No acute infiltrate or effusion on chest radiograph.  No fever or leukocytosis.  Not clear whether or not overuse of his bronchodilators on the day of admission contributed to his chest symptoms.  Pain resolved and did not recur.  Bronchodilators and oxygen administered.  Short course of oral prednisone initiated.  Parenteral diuretics administered.  Echocardiogram done on June 18 and compared with a December 2017 study now showing grade 2 diastolic dysfunction.  Persistent moderate tricuspid valve regurgitation and persistent and progressive elevation of peak pulmonary artery pressure 55 mm consistent with pulmonary hypertension.  Oxygen saturation 97% on room air at time of discharge.  It is my clinical impression that majority of his symptoms relate to right-sided heart failure.  Since the treatment of diastolic cardiac dysfunction and right heart failure relies primarily on diuretics, we have elected to increase his diuretic dose from 20 mg recently started prior  to this admission to 40 mg at discharge. Due to asymmetric calf swelling left greater than right a venous Doppler study was done which did not show any evidence for thrombosis. He felt significantly better by time of discharge.  Disposition: Condition stable at time of discharge He will follow-up with cardiology Dr. Claiborne Billings and pulmonary Dr. Lamonte Sakai There were no complications

## 2017-06-17 NOTE — Consult Note (Signed)
[facility] CM Primary Care Navigator  06/17/2017  Emerick Weatherly [facility] December 06, 1929 [id number]   Met with patient at the bedside to identify possible discharge needs. Patient reports having difficulty breathing which had led to this admission.  Patient endorses Cyndi Bender, PA with [place] [facility] as the primary care provider.   [facility] [place] [facility] with no problem.   Patient's daughter manages his medications at home using "pill box" system.  Daughter [name] - lives in a cottage at the back of patient's house) is providing transportation and scheduling appointments for patient with his doctors.  Daughter is the primary caregiver and takes care of "everything" at home as stated.   Anticipated discharge plan is home with daughter's assistance per patient. Patient has 2 daughters (one in town and the other is out of state) who are very supportive of his care.  Patient voiced understanding to call primary care provider's office when he returns home, for a post discharge follow-up appointment within a week or sooner if needs arise. Patient letter (with PCP's contact number) provided as a reminder.   Patient was admitted with active problems of COPD exacerbation and HF.  Patient admits lack of knowledge with the signs and symptoms of COPD and HF that will need medical assistance.  He also expressed not being aware of the COPD/ HF zones/ tool and has minimal recall of ways to manage COPD/ HF at home. MD note states, not clear whether or not overuse of his bronchodilators on the day of admission.   Explained to patient about [facility] care management services available for him. He verbally agreed and opted for referral to [facility] care management coordinator for home visits instead of phone calls (due to hard of hearing even with hearing aid use) to follow-up after discharge.   Referral made to [facility] care management for signs and symptoms of COPD exacerbation/ HF and assistance in managing chronic illnesses at home.   For questions, please contact:  Dannielle Huh, BSN, RN- [facility] Primary Care Navigator  Telephone: [phone] [place]

## 2017-06-17 NOTE — Progress Notes (Addendum)
   Subjective: Seen and evaluated today at bedside. Continues to feel well. Very pleased with his improvement. No complaints. Notes knee pain has actually resolved with 'whatever you doctors did.' Feels ready for discharge.   Objective:  Vital signs in last 24 hours: Vitals:   06/17/17 0500 06/17/17 0617 06/17/17 0720 06/17/17 0822  BP:  130/64 130/72 (!) 141/68  Pulse:   72   Resp:  16 18   Temp:   98.4 F (36.9 C)   TempSrc:   Oral   SpO2:   97%   Weight: 181 lb 4.8 oz (82.2 kg)     Height:       General: Very pleasant elderly caucasian male. In no acute distress. Jovial. Clean breakfast plate at bedside.  HENT: EOMI. Oropharynx clear, mucous membranes moist.  Cardiovascular: IRIR but normal rate. No MGR. Pulmonary: CTA BL. Good airflow throughout. Breathing unlabored. Speaks in complete sentences. Abdomen: Soft, non-tender and non-distended. +bowel sounds.  Extremities: Scant to no left lower extremity edema. Ted hose in place. No TTP. Psych: Mood normal and affect was mood congruent. Responds to questions appropriately.   Assessment/Plan:  Principal Problem:   Acute respiratory failure with hypoxia (HCC) Active Problems:   Paroxysmal atrial flutter (HCC)   Pacemaker- MDT 02/04/14   Hyperlipidemia   Monoclonal gammopathy of unknown significance (MGUS)   COPD exacerbation (HCC)   Anemia of chronic disease   CKD (chronic kidney disease) stage 3, GFR 30-59 ml/min   Acute coronary syndromes ([place])  # Acute Respiratory Failure with Hypoxia, Resolved ## Acute on Chronic Diastolic CHF ### Pulmonary Hypertension #### ?COPD exacerbation Remains without chest pain. Dyspnea much improved. No hypoxia with ambulation and saturated well on RA overnight. Ambulated on RA without hypoxia. ECHO yesterday with LVEF 42-87%, grade 2 diastolic dysfunction and peak PA pressure of 55. This was previously 13. He appears euvolemic. Will discharge home with increased dose of PO Lasix and close  follow-up with his cardiologist.  -Pred 40 mg daily, day 3 of steroids, continue for total of 5 days at d/c  #BL LE Edema Resolved. Doppler negative for DVT.  -Lasix as above -Compression stockings, LE elevation  #PAF ##SSS, s/p PPM Rates well controlled in past 24 hours on homen Metop 75 mg BID. -Continue Dig 0.125mg  daily -Currently on Eliquis 5mg . Will need to consider decrease should renal function not normalize  #Acute on Chronic Renal Insufficiency Cr 1.48 today, 1.42 on admission and improved from 1.77 yesterday. Historically patient has had fluctuating baseline Cr however appears to be ~1.3  Dispo: Anticipated discharge today.  Keyontae Huckeby, DO 06/17/2017, 8:50 AM Pager: [phone]

## 2017-06-17 NOTE — Discharge Instructions (Signed)
It was a pleasure to meet you [name].  Your trouble breathing was likely from a combination of heart failure and COPD exacerbation.  Your Lasix was increased to 40 mg daily to help keep the fluid levels down.  Please complete the Prednisone 40 mg once a day for 3 more days (6/20 - 6/22)  Please continue your medications as prescribed and follow up with your PCP, Dr. Claiborne Billings, and Dr. Lamonte Sakai.

## 2017-06-19 ENCOUNTER — Other Ambulatory Visit: Payer: Self-pay | Admitting: *Deleted

## 2017-06-19 ENCOUNTER — Encounter: Payer: Self-pay | Admitting: *Deleted

## 2017-06-19 DIAGNOSIS — H353122 Nonexudative age-related macular degeneration, left eye, intermediate dry stage: Secondary | ICD-10-CM | POA: Diagnosis not present

## 2017-06-19 DIAGNOSIS — H353112 Nonexudative age-related macular degeneration, right eye, intermediate dry stage: Secondary | ICD-10-CM | POA: Diagnosis not present

## 2017-06-19 DIAGNOSIS — H43821 Vitreomacular adhesion, right eye: Secondary | ICD-10-CM | POA: Diagnosis not present

## 2017-06-19 DIAGNOSIS — H353212 Exudative age-related macular degeneration, right eye, with inactive choroidal neovascularization: Secondary | ICD-10-CM | POA: Diagnosis not present

## 2017-06-19 NOTE — Patient Outreach (Addendum)
[facility]) Care Management  06/19/2017  [name] Jun 13, 1929 [id number]   Initial transition of care call Received referral from Dannielle Huh, CM  Primary Navigator.  Patient discharged from [facility] on 6/19, Dx: HF and COPD.   Placed call to patient preferred telephone number to offer [facility] care management services  received voice mail message , able to leave a message requesting a return call.  Plan  Will await return call if no response will attempt return call in the business day.   1300 Received return call from [facility], is identified herself as caregiver health care POA, hipaa information verified. She states she is the one helps answer any questions for patient, if someone ask him a question she states he looks to her to answer due to being hard of hearing and she will be the person to contact weekly.  Explained [facility] care management transition of care program and she has given  verbal agreement also to follow, noted patient has given verbal agreement also. Daughter discussed recent admission and patient  long history of COPD that she is more familiar with managing, request more focus on Heart failure management. Reviewed symptoms of heart failure, action plan items and reinforced importance of notifying MD .   She discussed patient had eye appointment for treatment of Macular degeneration on today.  Daughter discussed patient is managing well since being at home , he has a quad cane for use as needed at home. Patient denies  no increase in shortness of breath, cough, swelling, weight 181 on today, and 182 on yesterday. Daughter assist with managing patient medication using a pill organizer. She manages transportation, scheduling follow up appointments, meal preparations , housekeeping, patient is independent with ADL's.  Patient has follow up appointments with PCP, Pulmonary and cardiology in the next week.     Outpatient Encounter Prescriptions as  of 06/19/2017  Medication Sig  . albuterol (PROVENTIL HFA;VENTOLIN HFA) 108 (90 Base) MCG/ACT inhaler Inhale 2 puffs into the lungs every 4 (four) hours as needed for wheezing or shortness of breath.  Marland Kitchen apixaban (ELIQUIS) 5 MG TABS tablet Take 1 tablet (5 mg total) by mouth every 12 (twelve) hours.  Marland Kitchen atorvastatin (LIPITOR) 40 MG tablet Take 1 tablet (40 mg total) by mouth daily. PLEASE CONTACT OFFICE FOR ADDITIONAL REFILLS  . digoxin (LANOXIN) 0.125 MG tablet Take 1 tablet (0.125 mg total) by mouth daily.  . diphenhydramine-acetaminophen (TYLENOL PM) 25-500 MG TABS Take 2 tablets by mouth at bedtime.   . furosemide (LASIX) 40 MG tablet Take 1 tablet (40 mg total) by mouth daily.  . Methylcellulose, Laxative, (CITRUCEL) 500 MG TABS Take 1,000 mg by mouth at bedtime.  . metoprolol (LOPRESSOR) 50 MG tablet Take 75 mg by mouth 2 (two) times daily.   . midodrine (PROAMATINE) 5 MG tablet TAKE 1 TABLET (5 MG TOTAL) BY MOUTH 3 (THREE) TIMES DAILY.  . niacin (NIASPAN) 500 MG CR tablet TAKE 1 TABLET (500 MG TOTAL) BY MOUTH AT BEDTIME.  . predniSONE (DELTASONE) 20 MG tablet Take 2 tablets (40 mg total) by mouth daily with breakfast.  . Probiotic Product ([employer] SENIOR PROBIOTIC PO) Take 1 capsule by mouth at bedtime. Once daily  . Tamsulosin HCl (FLOMAX) 0.4 MG CAPS Take 0.4 mg by mouth at bedtime.   . Tiotropium Bromide-Olodaterol (STIOLTO RESPIMAT) 2.5-2.5 MCG/ACT AERS Inhale 2 puffs into the lungs daily.   No facility-administered encounter medications on file as of 06/19/2017.   Patient was recently  discharged from hospital and all medications have been reviewed.  Plan Patient will remain active with transition of care weekly outreaches, next call in a week. Will send patient welcome letter, PCP barrier letter.   Joylene Draft, RN, [place] Management [phone]- Mobile [phone]- Toll Free Main Office

## 2017-06-20 NOTE — Telephone Encounter (Signed)
RB is back in the office on 06/23/17. Form will be addressed then.

## 2017-06-23 NOTE — Telephone Encounter (Signed)
Form has been given to RB to sign. Will update chart as soon as this form is returned to me.

## 2017-06-24 NOTE — Telephone Encounter (Signed)
Surgical clearance form was given to [name] on 06/23/2017. I will update the chart once this form is returned to me.

## 2017-06-25 ENCOUNTER — Encounter: Payer: Self-pay | Admitting: Physician Assistant

## 2017-06-25 ENCOUNTER — Ambulatory Visit (INDEPENDENT_AMBULATORY_CARE_PROVIDER_SITE_OTHER): Payer: Medicare Other | Admitting: Physician Assistant

## 2017-06-25 VITALS — BP 130/56 | HR 85 | Ht 67.0 in | Wt 181.0 lb

## 2017-06-25 DIAGNOSIS — I951 Orthostatic hypotension: Secondary | ICD-10-CM

## 2017-06-25 DIAGNOSIS — I5032 Chronic diastolic (congestive) heart failure: Secondary | ICD-10-CM | POA: Diagnosis not present

## 2017-06-25 DIAGNOSIS — I480 Paroxysmal atrial fibrillation: Secondary | ICD-10-CM

## 2017-06-25 DIAGNOSIS — I2510 Atherosclerotic heart disease of native coronary artery without angina pectoris: Secondary | ICD-10-CM

## 2017-06-25 NOTE — Patient Instructions (Addendum)
Medication Instructions:  Your physician recommends that you continue on your current medications as directed. Please refer to the Current Medication list given to you today.   Follow-Up: Keep follow up in September with Dr. Claiborne Billings.    Any Other Special Instructions Will Be Listed Below (If Applicable).  Continue daily weights and low sodium diet-2 gram of sodium daily   DASH Eating Plan DASH stands for "Dietary Approaches to Stop Hypertension." The DASH eating plan is a healthy eating plan that has been shown to reduce high blood pressure (hypertension). It may also reduce your risk for type 2 diabetes, heart disease, and stroke. The DASH eating plan may also help with weight loss. What are tips for following this plan? General guidelines  Avoid eating more than 2,300 mg (milligrams) of salt (sodium) a day. If you have hypertension, you may need to reduce your sodium intake to 1,500 mg a day.  Limit alcohol intake to no more than 1 drink a day for nonpregnant women and 2 drinks a day for men. One drink equals 12 oz of beer, 5 oz of wine, or 1 oz of hard liquor.  Work with your health care provider to maintain a healthy body weight or to lose weight. Ask what an ideal weight is for you.  Get at least 30 minutes of exercise that causes your heart to beat faster (aerobic exercise) most days of the week. Activities may include walking, swimming, or biking.  Work with your health care provider or diet and nutrition specialist (dietitian) to adjust your eating plan to your individual calorie needs. Reading food labels  Check food labels for the amount of sodium per serving. Choose foods with less than 5 percent of the Daily Value of sodium. Generally, foods with less than 300 mg of sodium per serving fit into this eating plan.  To find whole grains, look for the word "whole" as the first word in the ingredient list. Shopping  Buy products labeled as "low-sodium" or "no salt  added."  Buy fresh foods. Avoid canned foods and premade or frozen meals. Cooking  Avoid adding salt when cooking. Use salt-free seasonings or herbs instead of table salt or sea salt. Check with your health care provider or pharmacist before using salt substitutes.  Do not fry foods. Cook foods using healthy methods such as baking, boiling, grilling, and broiling instead.  Cook with heart-healthy oils, such as olive, canola, soybean, or sunflower oil. Meal planning   Eat a balanced diet that includes: ? 5 or more servings of fruits and vegetables each day. At each meal, try to fill half of your plate with fruits and vegetables. ? Up to 6-8 servings of whole grains each day. ? Less than 6 oz of lean meat, poultry, or fish each day. A 3-oz serving of meat is about the same size as a deck of cards. One egg equals 1 oz. ? 2 servings of low-fat dairy each day. ? A serving of nuts, seeds, or beans 5 times each week. ? Heart-healthy fats. Healthy fats called Omega-3 fatty acids are found in foods such as flaxseeds and coldwater fish, like sardines, salmon, and mackerel.  Limit how much you eat of the following: ? Canned or prepackaged foods. ? Food that is high in trans fat, such as fried foods. ? Food that is high in saturated fat, such as fatty meat. ? Sweets, desserts, sugary drinks, and other foods with added sugar. ? Full-fat dairy products.  Do not salt foods  before eating.  Try to eat at least 2 vegetarian meals each week.  Eat more home-cooked food and less restaurant, buffet, and fast food.  When eating at a restaurant, ask that your food be prepared with less salt or no salt, if possible. What foods are recommended? The items listed may not be a complete list. Talk with your dietitian about what dietary choices are best for you. Grains Whole-grain or whole-wheat bread. Whole-grain or whole-wheat pasta. Brown rice. [place]ena Morrow. Bulgur. Whole-grain and low-sodium cereals.  Pita bread. Low-fat, low-sodium crackers. Whole-wheat flour tortillas. Vegetables Fresh or frozen vegetables (raw, steamed, roasted, or grilled). Low-sodium or reduced-sodium tomato and vegetable juice. Low-sodium or reduced-sodium tomato sauce and tomato paste. Low-sodium or reduced-sodium canned vegetables. Fruits All fresh, dried, or frozen fruit. Canned fruit in natural juice (without added sugar). Meat and other protein foods Skinless chicken or [place]. Ground chicken or [place]. Pork with fat trimmed off. Fish and seafood. Egg whites. Dried beans, peas, or lentils. Unsalted nuts, nut butters, and seeds. Unsalted canned beans. Lean cuts of beef with fat trimmed off. Low-sodium, lean deli meat. Dairy Low-fat (1%) or fat-free (skim) milk. Fat-free, low-fat, or reduced-fat cheeses. Nonfat, low-sodium ricotta or cottage cheese. Low-fat or nonfat yogurt. Low-fat, low-sodium cheese. Fats and oils Soft margarine without trans fats. Vegetable oil. Low-fat, reduced-fat, or light mayonnaise and salad dressings (reduced-sodium). Canola, safflower, olive, soybean, and sunflower oils. Avocado. Seasoning and other foods Herbs. Spices. Seasoning mixes without salt. Unsalted popcorn and pretzels. Fat-free sweets. What foods are not recommended? The items listed may not be a complete list. Talk with your dietitian about what dietary choices are best for you. Grains Baked goods made with fat, such as croissants, muffins, or some breads. Dry pasta or rice meal packs. Vegetables Creamed or fried vegetables. Vegetables in a cheese sauce. Regular canned vegetables (not low-sodium or reduced-sodium). Regular canned tomato sauce and paste (not low-sodium or reduced-sodium). Regular tomato and vegetable juice (not low-sodium or reduced-sodium). Angie Fava. Olives. Fruits Canned fruit in a light or heavy syrup. Fried fruit. Fruit in cream or butter sauce. Meat and other protein foods Fatty cuts of meat. Ribs. Fried  meat. Berniece Salines. Sausage. Bologna and other processed lunch meats. Salami. Fatback. Hotdogs. Bratwurst. Salted nuts and seeds. Canned beans with added salt. Canned or smoked fish. Whole eggs or egg yolks. Chicken or [place] with skin. Dairy Whole or 2% milk, cream, and half-and-half. Whole or full-fat cream cheese. Whole-fat or sweetened yogurt. Full-fat cheese. Nondairy creamers. Whipped toppings. Processed cheese and cheese spreads. Fats and oils Butter. Stick margarine. Lard. Shortening. Ghee. Bacon fat. Tropical oils, such as coconut, palm kernel, or palm oil. Seasoning and other foods Salted popcorn and pretzels. Onion salt, garlic salt, seasoned salt, table salt, and sea salt. Worcestershire sauce. Tartar sauce. Barbecue sauce. Teriyaki sauce. Soy sauce, including reduced-sodium. Steak sauce. Canned and packaged gravies. Fish sauce. Oyster sauce. Cocktail sauce. Horseradish that you find on the shelf. Ketchup. Mustard. Meat flavorings and tenderizers. Bouillon cubes. Hot sauce and Tabasco sauce. Premade or packaged marinades. Premade or packaged taco seasonings. Relishes. Regular salad dressings. Where to find more information:  National Heart, Lung, and [place]: https://wilson-eaton.com/  American Heart Association: www.heart.org Summary  The DASH eating plan is a healthy eating plan that has been shown to reduce high blood pressure (hypertension). It may also reduce your risk for type 2 diabetes, heart disease, and stroke.  With the DASH eating plan, you should limit salt (sodium) intake to 2,300 mg  a day. If you have hypertension, you may need to reduce your sodium intake to 1,500 mg a day.  When on the DASH eating plan, aim to eat more fresh fruits and vegetables, whole grains, lean proteins, low-fat dairy, and heart-healthy fats.  Work with your health care provider or diet and nutrition specialist (dietitian) to adjust your eating plan to your individual calorie needs. This information is  not intended to replace advice given to you by your health care provider. Make sure you discuss any questions you have with your health care provider. Document Released: 12/05/2011 Document Revised: 12/09/2016 Document Reviewed: 12/09/2016 Elsevier Interactive Patient Education  2017 [employer].     If you need a refill on your cardiac medications before your next appointment, please call your pharmacy.

## 2017-06-25 NOTE — Progress Notes (Signed)
Cardiology Office Note   Date:  06/25/2017   ID:  [name], DOB 1929/04/17, MRN [id number]  PCP:  Lance Bender, PA-C  Cardiologist:  [name] 09/10/2016  [name] 06/03/2017 Lance Ferries, PA-C   Chief Complaint  Patient presents with  . Follow-up    History of Present Illness: [name] is a 83 y.o. male with a history of CABG in 2006 with LIMA-LAD, SVG-Cx, SVG-IM-OM, low-risk NST in 08/2016, SSS (s/p MDT PPM placement 2015), PAF (on Eliquis), orthostatic hypotension, and COPD, CHADS2VASC=4 (CHF, CAD, age x 2)  Admitted  06/17-06/19/2018 for respiratory failure, D-CHF, discharge weight 181 pounds, metoprolol at 75 mg twice a day  [name] presents for cardiology follow up.  He has done well since d/c. His daughter does the heavier housework and yard work. 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   Denies orthopnea or PND. No new DOE. Some mild lower extremity edema late in the day, but he does not wake with any edema. Daughter does the grocery shopping.She is vigilant about his intake and make sure that he sticks to a low sodium diet. He is compliant with his medications. She fills his med boxes and make sure that his med list is accurate. He is weighing daily. His daughter estimates that he can walk about 200 feet now. He is not wheezing.  He does not get chest pain. He has not had any palpitations and doesn't think he has been in atrial fib.   Past Medical History:  Diagnosis Date  . ACS (acute coronary syndrome) ([place] [place])   . Acute respiratory failure with hypoxia ([place]) 05/2017  . Anemia of renal disease 12/05/2011  . Anemia, iron deficiency 12/05/2011  . Asthma   . CAS (cerebral atherosclerosis)    Carotid Dopplers, 04/09/2012 - Bilateral Bulb/Proximal ICAs-mild to moderate amount of fibrous plaque of fibrous soft plaque w/o evidence of a significant diameter reduction,  dissection, or any other vascular abnormality  . COPD (chronic obstructive pulmonary disease) ([place])   . Coronary heart disease    a. s/p CABG in 2006 with LIMA-LAD, SVG-Cx, SVG-IM-OM b. low-risk NST in 08/2016  . Dysrhythmia 11/09/2013   SVT VS A FLUTTER   . Kidney stones   . MGUS (monoclonal gammopathy of unknown significance) 12/05/2011  . Orthostatic hypotension 09/10/2016  . PAF (paroxysmal atrial fibrillation) ([facility])    a. on Eliquis  . Shortness of breath   . SSS (sick sinus syndrome) ([place])    a. s/p PPM placement in 2015    Past Surgical History:  Procedure Laterality Date  . CARDIAC CATHETERIZATION  12/05/2005   Recommended CABG  . CARDIOVERSION N/A 11/09/2013   Procedure: CARDIOVERSION;  Surgeon: Pixie Casino, MD;  Location: [facility] ENDOSCOPY;  Service: Cardiovascular;  Laterality: N/A;  . CATARACT EXTRACTION    . CORONARY ARTERY BYPASS GRAFT  12/06/2005   x4, LIMA to LAD, vein to distal circumflex, sequential vein to intermediate and obtuse marginal vessel  . INNER EAR SURGERY    . KIDNEY STONE SURGERY    . LOOP RECORDER IMPLANT N/A 11/30/2013   Procedure: LOOP RECORDER IMPLANT;  Surgeon: Sanda Klein, MD;  Location: [facility] CATH LAB;  Service: Cardiovascular;  Laterality: N/A;  . PERMANENT PACEMAKER INSERTION N/A 02/04/2014   Procedure: PERMANENT PACEMAKER INSERTION;  Surgeon: Sanda Klein, MD;  Location: [facility] CATH LAB;  Service: Cardiovascular;  Laterality: N/A;  .  TEE WITHOUT CARDIOVERSION N/A 11/09/2013   Procedure: TRANSESOPHAGEAL ECHOCARDIOGRAM (TEE);  Surgeon: Pixie Casino, MD;  Location: [facility] ENDOSCOPY;  Service: Cardiovascular;  Laterality: N/A;    Current Outpatient Prescriptions  Medication Sig Dispense Refill  . albuterol (PROVENTIL HFA;VENTOLIN HFA) 108 (90 Base) MCG/ACT inhaler Inhale 2 puffs into the lungs every 4 (four) hours as needed for wheezing or shortness of breath. 3 Inhaler 0  . apixaban (ELIQUIS) 5 MG TABS tablet Take 1 tablet (5 mg total) by mouth every  12 (twelve) hours. 180 tablet 2  . atorvastatin (LIPITOR) 40 MG tablet Take 1 tablet (40 mg total) by mouth daily. PLEASE CONTACT OFFICE FOR ADDITIONAL REFILLS 90 tablet 0  . digoxin (LANOXIN) 0.125 MG tablet Take 1 tablet (0.125 mg total) by mouth daily. 90 tablet 3  . diphenhydramine-acetaminophen (TYLENOL PM) 25-500 MG TABS Take 2 tablets by mouth at bedtime.     . ENSURE (ENSURE) Take 1 Can by mouth daily.    . furosemide (LASIX) 40 MG tablet Take 1 tablet (40 mg total) by mouth daily. 30 tablet 1  . Methylcellulose, Laxative, (CITRUCEL) 500 MG TABS Take 1,000 mg by mouth at bedtime.    . metoprolol (LOPRESSOR) 50 MG tablet Take 75 mg by mouth 2 (two) times daily.     . midodrine (PROAMATINE) 5 MG tablet TAKE 1 TABLET (5 MG TOTAL) BY MOUTH 3 (THREE) TIMES DAILY. 270 tablet 4  . niacin (NIASPAN) 500 MG CR tablet TAKE 1 TABLET (500 MG TOTAL) BY MOUTH AT BEDTIME. 90 tablet 0  . Probiotic Product (CVS SENIOR PROBIOTIC PO) Take 1 capsule by mouth at bedtime. Once daily    . Tamsulosin HCl (FLOMAX) 0.4 MG CAPS Take 0.4 mg by mouth at bedtime.     . Tiotropium Bromide-Olodaterol (STIOLTO RESPIMAT) 2.5-2.5 MCG/ACT AERS Inhale 2 puffs into the lungs daily. 3 Inhaler 3   No current facility-administered medications for this visit.     Allergies:   Patient has no known allergies.    Social History:  The patient  reports that he quit smoking about 23 years ago. His smoking use included Cigarettes. He started smoking about 65 years ago. He has a 47.00 pack-year smoking history. He has never used smokeless tobacco. He reports that he does not drink alcohol or use drugs.   Family History:  The patient's family history includes Emphysema in his daughter; Heart attack in his father and mother; Heart disease in his brother and mother; Tuberculosis in his father.    ROS:  Please see the history of present illness. All other systems are reviewed and negative.    PHYSICAL EXAM: VS:  BP (!) 130/56    Pulse 85   Ht 5\' 7"  (1.702 m)   Wt 181 lb (82.1 kg)   SpO2 95%   BMI 28.35 kg/m  , BMI Body mass index is 28.35 kg/m. GEN: Well nourished, well developed, male in no acute distress  HEENT: normal for age  Neck: no JVD, no carotid bruit, no masses Cardiac: RRR; no murmur, no rubs, or gallops Respiratory:Some scattered rales bilaterally, normal work of breathing; no wheezing GI: soft, nontender, nondistended, + BS MS: no deformity or atrophy; no edema; distal pulses are 2+ in all 4 extremities   Skin: warm and dry, no rash Neuro:  Strength and sensation are intact Psych: euthymic mood, full affect   EKG:  EKG is not ordered today.  Recent Labs: 06/05/2017: ALT 32 06/15/2017: B Natriuretic Peptide 343.0 06/17/2017: BUN  37; Creatinine, Ser 1.48; Hemoglobin 10.4; Platelets 127; Potassium 4.0; Sodium 141    Lipid Panel    Component Value Date/Time   CHOL 99 (L) 08/15/2015 1151   TRIG 143 08/15/2015 1151   HDL 30 (L) 08/15/2015 1151   CHOLHDL 3.3 08/15/2015 1151   VLDL 29 08/15/2015 1151   LDLCALC 40 08/15/2015 1151     Wt Readings from Last 3 Encounters:  06/25/17 181 lb (82.1 kg)  06/17/17 181 lb 4.8 oz (82.2 kg)  06/05/17 185 lb (83.9 kg)     Other studies Reviewed: Additional studies/ records that were reviewed today include: Office notes, hospital records and testing.  ASSESSMENT AND PLAN:  1.  Chronic diastolic CHF: His weight is stable, his volume status is good. He is compliant with Lasix 40 mg daily. He will need a BMET and a dig level next week.  2. PAF: He is not having palpitations. He is compliant with transmissions from his pacemaker. Continue metoprolol and digoxin. Check a dig level  3. CAD: He is doing well and is encouraged to increase his activity as tolerated. He is active around the house, but does not exercise. He is encouraged to start exercising by walking continuously as he is able.  He is advised that he does not need to exert himself very  strenuously and avoid temperature extremes. Climate control would be best but if he got outside and walk first thing in the morning or in the evening, that would be okay. No ASA secondary to Eliquis. He is on beta blocker and statin.  4. History of orthostatic hypotension: He has had multiple blood pressures in the 130s and 140s recently. Discuss with Lance. Claiborne Fuller if it is reasonable to try decreasing the midodrine, perhaps by eliminating the evening dose. He is not having symptoms of orthostatic hypotension   Current medicines are reviewed at length with the patient today.  The patient does not have concerns regarding medicines.  The following changes have been made:  no change  Labs/ tests ordered today include:   BMET and dig level   Disposition:   FU with Lance. Claiborne Fuller  Signed, [name], Suanne Marker, PA-C  06/25/2017 3:14 PM    [place] Phone: [phone]; Fax: [phone]  This note was written with the assistance of speech recognition software. Please excuse any transcriptional errors.

## 2017-06-26 ENCOUNTER — Encounter: Payer: Self-pay | Admitting: *Deleted

## 2017-06-26 ENCOUNTER — Other Ambulatory Visit: Payer: Self-pay | Admitting: *Deleted

## 2017-06-26 DIAGNOSIS — Z79899 Other long term (current) drug therapy: Secondary | ICD-10-CM | POA: Diagnosis not present

## 2017-06-26 DIAGNOSIS — I50810 Right heart failure, unspecified: Secondary | ICD-10-CM | POA: Diagnosis not present

## 2017-06-26 DIAGNOSIS — J449 Chronic obstructive pulmonary disease, unspecified: Secondary | ICD-10-CM | POA: Diagnosis not present

## 2017-06-26 DIAGNOSIS — E663 Overweight: Secondary | ICD-10-CM | POA: Diagnosis not present

## 2017-06-26 DIAGNOSIS — Z09 Encounter for follow-up examination after completed treatment for conditions other than malignant neoplasm: Secondary | ICD-10-CM | POA: Diagnosis not present

## 2017-06-26 DIAGNOSIS — I4892 Unspecified atrial flutter: Secondary | ICD-10-CM | POA: Diagnosis not present

## 2017-06-26 DIAGNOSIS — Z6825 Body mass index (BMI) 25.0-25.9, adult: Secondary | ICD-10-CM | POA: Diagnosis not present

## 2017-06-26 NOTE — Telephone Encounter (Signed)
Form was filled out by RB and returned to me but it was not signed. This will be returned to [place] and faxed once it's signed. I will update the chart as soon as the form is returned to me.

## 2017-06-26 NOTE — Patient Outreach (Signed)
[facility]) Care Management  06/26/2017  [name] Oct 01, 1929 [id number]   Transition of care call  Placed call to patient , able to speak with Daughter [name] per request due to patient being hard of hearing.  She reports patient is doing  managing well at home , tolerated usual activity around home.   Patient continues to weigh daily, today's reading 178, no increase in weight , swelling or shortness of breath. Daughter denies any new concerns at this time.  Patient has post hospital visit with PCP on today.   Plan Transition of care home visit planned in next week.   Joylene Draft, RN, [place] Management [phone]- Mobile [phone]- Toll Free Main Office

## 2017-06-27 ENCOUNTER — Telehealth: Payer: Self-pay | Admitting: *Deleted

## 2017-06-27 NOTE — Telephone Encounter (Signed)
Called patient and spoke to daughter (ok per DPR) at request of Rosaria Ferries PA to have blood work next week-dig level and BMET.   Daughter reports they were at PCP yesterday and they drew blood and to contact them.  If additional needs to be drawn to contact her back.  Will contact PCP for copy of labs.   Verbalized understanding.    Contacted PCP-bmet and dig completed.  Will fax to office to Rosaria Ferries PA attention.

## 2017-06-27 NOTE — Telephone Encounter (Signed)
Form has been signed and returned to me. This has been faxed back to [place]. Nothing further was needed.

## 2017-06-30 ENCOUNTER — Telehealth: Payer: Self-pay

## 2017-06-30 NOTE — Telephone Encounter (Signed)
Suanne Marker seen patient last week, spoke with Dr Claiborne Billings about changing Proamatine (Midodrine) . Per Dr Claiborne Billings patient to change directions on RX from tid to bid. Unable to reach patient, left voicemail advising patient to contact office.

## 2017-07-01 ENCOUNTER — Other Ambulatory Visit: Payer: Self-pay | Admitting: *Deleted

## 2017-07-01 ENCOUNTER — Ambulatory Visit: Payer: Self-pay | Admitting: *Deleted

## 2017-07-01 NOTE — Patient Outreach (Signed)
[facility]) Care Management   07/01/2017  EDISON NICHOLSON May 16, 1929 [id number]  GEDDY BOYDSTUN is an 83 y.o. male  Subjective:  Patient discussed doing pretty good at home . Reports tolerating his usual activity . He discussed he continues to participate in senior activities of bowling , chair volley ball weekly and attends  indoor flea market on weekend.  Patient voiced looking forward to getting new hearing aid on next week.     Objective:  BP 112/60 (BP Location: Right Arm, Patient Position: Sitting, Cuff Size: Large)   Pulse 72   Resp 18   Wt 179 lb (81.2 kg)   SpO2 98%   BMI 28.04 kg/m   Patient resting in recliner chair.   Review of Systems  Constitutional: Negative.   HENT: Positive for hearing loss.        Hard of hearing ,   Eyes: Negative.   Respiratory: Negative.   Cardiovascular: Negative.   Gastrointestinal: Negative.   Genitourinary: Negative.   Musculoskeletal: Negative.   Skin: Negative.   Neurological: Negative.   Endo/Heme/Allergies: Negative.   Psychiatric/Behavioral: Negative.     Physical Exam  Constitutional: He is oriented to person, place, and time. He appears well-developed and well-nourished.  Cardiovascular: Normal rate, normal heart sounds and intact distal pulses.   Respiratory: Effort normal and breath sounds normal.  GI: Soft. Bowel sounds are normal.  Neurological: He is alert and oriented to person, place, and time.  Skin: Skin is warm and dry.  Psychiatric: He has a normal mood and affect. His behavior is normal. Judgment and thought content normal.    Encounter Medications:   Outpatient Encounter Prescriptions as of 07/01/2017  Medication Sig  . albuterol (PROVENTIL HFA;VENTOLIN HFA) 108 (90 Base) MCG/ACT inhaler Inhale 2 puffs into the lungs every 4 (four) hours as needed for wheezing or shortness of breath.  Marland Kitchen apixaban (ELIQUIS) 5 MG TABS tablet Take 1 tablet (5 mg total) by mouth every 12 (twelve) hours.   Marland Kitchen atorvastatin (LIPITOR) 40 MG tablet Take 1 tablet (40 mg total) by mouth daily. PLEASE CONTACT OFFICE FOR ADDITIONAL REFILLS  . digoxin (LANOXIN) 0.125 MG tablet Take 1 tablet (0.125 mg total) by mouth daily.  . diphenhydramine-acetaminophen (TYLENOL PM) 25-500 MG TABS Take 2 tablets by mouth at bedtime.   . ENSURE (ENSURE) Take 1 Can by mouth daily.  . furosemide (LASIX) 40 MG tablet Take 1 tablet (40 mg total) by mouth daily.  . Methylcellulose, Laxative, (CITRUCEL) 500 MG TABS Take 1,000 mg by mouth at bedtime.  . metoprolol (LOPRESSOR) 50 MG tablet Take 75 mg by mouth 2 (two) times daily.   . midodrine (PROAMATINE) 5 MG tablet TAKE 1 TABLET (5 MG TOTAL) BY MOUTH 3 (THREE) TIMES DAILY.  . niacin (NIASPAN) 500 MG CR tablet TAKE 1 TABLET (500 MG TOTAL) BY MOUTH AT BEDTIME.  . Probiotic Product ([employer] SENIOR PROBIOTIC PO) Take 1 capsule by mouth at bedtime. Once daily  . Tamsulosin HCl (FLOMAX) 0.4 MG CAPS Take 0.4 mg by mouth at bedtime.   . Tiotropium Bromide-Olodaterol (STIOLTO RESPIMAT) 2.5-2.5 MCG/ACT AERS Inhale 2 puffs into the lungs daily.   No facility-administered encounter medications on file as of 07/01/2017.     Functional Status:   In your present state of health, do you have any difficulty performing the following activities: 06/19/2017 06/17/2017  Hearing? Tempie Donning  Vision? N N  Difficulty concentrating or making decisions? N N  Walking or climbing stairs? Darreld Mclean  Y  Dressing or bathing? N N  Doing errands, shopping? Tempie Donning  Preparing Food and eating ? N -  Using the Toilet? N -  In the past six months, have you accidently leaked urine? N -  Do you have problems with loss of bowel control? N -  Managing your Medications? Y -  Managing your Finances? Y -  Housekeeping or managing your Housekeeping? Y -  Some recent data might be hidden  Patient was recently discharged from hospital and all medications have been reviewed.  Fall/Depression Screening:    Fall Risk  07/01/2017 06/19/2017  11/09/2015  Falls in the past year? - No No  Risk for fall due to : Impaired mobility - -  Risk for fall due to (comments): has cane " has bad knee" - -   PHQ 2/9 Scores 07/01/2017 04/01/2014  PHQ - 2 Score 0 0    Assessment:  Transition of care home visit, daughter Izzak Fries . Daughter assist with patient medication organization, appointments, transportation and preparing daily meals .   Heart Failure  Continuing daily weight monitoring, weight stable identifies being in green zone once reviewed. Taking medications as prescribed, daughter prepares pill organizer. Has heart failure zone chart posted in home.    COPD Patient reports being in green zone , used albuterol last on yesterday, reports he usually uses it prior to participating in bowling or chair volley ball. Daughter reports he needs reminding at times to use rescue inhaler.   Fall Risk- High  Uses cane at times, walks in home with socks only, denies fall in last year, walks in home with socks only.  Has arthritis left knee.   Patient daughter discussed concern regarding eliquis , she often ask MD office for samples .    Plan:  [facility] packet reviewed , consent signed  Patient/daughter will notify MD of worsening of heart failure or COPD symptoms .  Patient centered collaborative care planning reviewed. Will place pharmacy consult related to cost of Eliquis  Will continue weekly transition of care outreach, next call in a week.  Will send PCP office visit note.    [facility] CM Care Plan Problem One     Most Recent Value  Care Plan Problem One  Patient with recent hospital admission related to heart failure   Role Documenting the Problem One  Care Management [place] for Problem One  Active  [facility] Long Term Goal   Patient will not experience a hospital admission in the next 60 days   [facility] Long Term Goal Start Date  06/19/17  Interventions for Problem One Long Term Goal  Instructed regarding notifying daughter of  weight gains, swelling, emphasized importance of notifying MD sooner for new concerns   [facility] CM Short Term Goal #1   Patient will attend all medical appointments in the next 30 days   [facility] CM Short Term Goal #1 Start Date  06/19/17  Interventions for Short Term Goal #1  Advised regarding the importance of attending all follow up appointment,  [facility] CM Short Term Goal #2   Patient will weigh daily and keep a record in the next 30 days  [facility] CM Short Term Goal #2 Start Date  06/19/17  Interventions for Short Term Goal #2  Provided [facility] calendar and reviewed how to use to keep log of weights,   [facility]N CM Short Term Goal #3  Patient/caregiver will report increase knowledge of Heart failure  Zone and action plans in  the next 30 days   [facility] CM Short Term Goal #3 Start Date  06/19/17  Interventions for Short Tern Goal #3  Provided and reviewed Heart failure zone chart and importance of action plan for yellow zone symtoms       Joylene Draft, RN, [place] Management [phone]- Mobile [phone]- [place] Office

## 2017-07-03 ENCOUNTER — Encounter: Payer: Self-pay | Admitting: *Deleted

## 2017-07-03 MED ORDER — MIDODRINE HCL 5 MG PO TABS: ORAL_TABLET | ORAL | 4 refills | Status: DC

## 2017-07-03 NOTE — Telephone Encounter (Signed)
Called home number and patients daughter Dakoda Laventure phone number but no answer. Called Patient daughter [name] (she was a bit shocked that I called her; I informed her that I tried contacting her sister, [name], and dad with the first numbers that were listed in the chart) and gave her the updated medication information for Midodrine per Dr Claiborne Billings patient is to take med bid. Daughter voiced understanding and stated she would contact JoAnne and inform her of the changes.

## 2017-07-08 ENCOUNTER — Other Ambulatory Visit: Payer: Self-pay | Admitting: *Deleted

## 2017-07-08 DIAGNOSIS — H6042 Cholesteatoma of left external ear: Secondary | ICD-10-CM | POA: Diagnosis not present

## 2017-07-08 DIAGNOSIS — H903 Sensorineural hearing loss, bilateral: Secondary | ICD-10-CM | POA: Diagnosis not present

## 2017-07-08 DIAGNOSIS — H6122 Impacted cerumen, left ear: Secondary | ICD-10-CM | POA: Diagnosis not present

## 2017-07-08 NOTE — Patient Outreach (Signed)
[facility]) Care Management  07/08/2017  [name] 1929-07-17 [id number]   Transition of care call  Spoke with patient daughter [name], as patient has agreed, because he is does not talk on the phone, hipaa information verified. She reports patient is still doing well at home, continues to tolerate usual activities. Patient continues to weigh daily , weight range between 178 to 179, no swelling or increase in shortness of breath.   Patient has appointment today regarding his new hearing aid. Daughter denies any new concerns   Plan Will continue weekly transition of care outreach call, next call in a week, explained to daughter coworker Tomasa Rand, CM will place call on next week.     Joylene Draft, RN, [place] Management [phone]- Mobile [phone]- Toll Free Main Office

## 2017-07-16 ENCOUNTER — Other Ambulatory Visit: Payer: Self-pay

## 2017-07-16 ENCOUNTER — Encounter: Payer: Self-pay | Admitting: Acute Care

## 2017-07-16 ENCOUNTER — Ambulatory Visit (INDEPENDENT_AMBULATORY_CARE_PROVIDER_SITE_OTHER): Payer: Medicare Other | Admitting: Acute Care

## 2017-07-16 DIAGNOSIS — I2510 Atherosclerotic heart disease of native coronary artery without angina pectoris: Secondary | ICD-10-CM | POA: Diagnosis not present

## 2017-07-16 DIAGNOSIS — J439 Emphysema, unspecified: Secondary | ICD-10-CM

## 2017-07-16 MED ORDER — TIOTROPIUM BROMIDE-OLODATEROL 2.5-2.5 MCG/ACT IN AERS: 2.0000 | INHALATION_SPRAY | Freq: Every day | RESPIRATORY_TRACT | 0 refills | Status: DC

## 2017-07-16 NOTE — Progress Notes (Signed)
History of Present Illness [name] is a 83 y.o. male with COPD, allergic rhinitis, CHF, PAF Pulmonary hypertension and anemia of chronic disease. He is followed by Dr. Lamonte Sakai.   07/16/2017 Hospital Follow Up: Pt. Presents for hospital follow up. He was admitted to the hospital for a COPD/ CHF  flare 06/15/2017-06/17/2017. He was treated with antibiotics, steroids, scheduled BD's, lasix  and oxygen. He was diuresed for 5 pounds of fluid as an inpatient.He was discharged on a prednisone taper and Lasix 40 mg daily.He is compliant with his Spiriva 2 puffs in the morning.Marland Kitchen He is compliant with his Lasix daily. He is weighing himself daily. Weight is stable. He is not using his nebulizer treatments, as he does not need them.Marland KitchenHe is using his ProAir prior to his Engineer, water and as needed.He states he has no cough, or sputum. He has had cardiology office follow up 6/27.He denies fever, chest pain, orthopnea or hemoptysis.He is back to baseline after flare.  Test Results:  CBC Latest Ref Rng & Units 06/17/2017 06/15/2017 06/05/2017  WBC 4.0 - 10.5 K/uL 9.7 6.8 4.4  Hemoglobin 13.0 - 17.0 g/dL 10.4(L) 11.6(L) 12.7(L)  Hematocrit 39.0 - 52.0 % 32.6(L) 36.1(L) 38.1(L)  Platelets 150 - 400 K/uL 127(L) 139(L) 143(L)    BMP Latest Ref Rng & Units 06/17/2017 06/16/2017 06/15/2017  Glucose 65 - 99 mg/dL 105(H) 137(H) 130(H)  BUN 6 - 20 mg/dL 37(H) 30(H) 16  Creatinine 0.61 - 1.24 mg/dL 1.48(H) 1.77(H) 1.42(H)  Sodium 135 - 145 mmol/L 141 138 137  Potassium 3.5 - 5.1 mmol/L 4.0 4.8 3.9  Chloride 101 - 111 mmol/L 106 103 102  CO2 22 - 32 mmol/L 27 25 24   Calcium 8.9 - 10.3 mg/dL 8.9 9.1 8.8(L)    BNP    Component Value Date/Time   BNP 343.0 (H) 06/15/2017 0426   BNP 170.3 (H) 06/10/2014 1105    ProBNP    Component Value Date/Time   PROBNP 221.0 (H) 01/08/2011 0430    PFT No results found for: FEV1PRE, FEV1POST, FVCPRE, FVCPOST, TLC, DLCOUNC, PREFEV1FVCRT, PSTFEV1FVCRT  No results  found.   Past medical hx Past Medical History:  Diagnosis Date  . ACS (acute coronary syndrome) ([place])   . Acute respiratory failure with hypoxia ([place]) 05/2017  . Anemia of renal disease 12/05/2011  . Anemia, iron deficiency 12/05/2011  . Asthma   . CAS (cerebral atherosclerosis)    Carotid Dopplers, 04/09/2012 - Bilateral Bulb/Proximal ICAs-mild to moderate amount of fibrous plaque of fibrous soft plaque w/o evidence of a significant diameter reduction, dissection, or any other vascular abnormality  . COPD (chronic obstructive pulmonary disease) ([place])   . Coronary heart disease    a. s/p CABG in 2006 with LIMA-LAD, SVG-Cx, SVG-IM-OM b. low-risk NST in 08/2016  . Dysrhythmia 11/09/2013   SVT VS A FLUTTER   . Kidney stones   . MGUS (monoclonal gammopathy of unknown significance) 12/05/2011  . Orthostatic hypotension 09/10/2016  . PAF (paroxysmal atrial fibrillation) ([facility])    a. on Eliquis  . Shortness of breath   . SSS (sick sinus syndrome) ([facility])    a. s/p PPM placement in 2015     Social History  Substance Use Topics  . Smoking status: Former Smoker    Packs/day: 1.00    Years: 47.00    Types: Cigarettes    Start date: 12/02/1951    Quit date: 12/30/1993  . Smokeless tobacco: Never Used     Comment: quit 20 years  ago  . Alcohol use No    Tobacco Cessation: Former smoker quit 1995  Past surgical hx, Family hx, Social hx all reviewed.  Current Outpatient Prescriptions on File Prior to Visit  Medication Sig  . albuterol (PROVENTIL HFA;VENTOLIN HFA) 108 (90 Base) MCG/ACT inhaler Inhale 2 puffs into the lungs every 4 (four) hours as needed for wheezing or shortness of breath.  Marland Kitchen apixaban (ELIQUIS) 5 MG TABS tablet Take 1 tablet (5 mg total) by mouth every 12 (twelve) hours.  Marland Kitchen atorvastatin (LIPITOR) 40 MG tablet Take 1 tablet (40 mg total) by mouth daily. PLEASE CONTACT OFFICE FOR ADDITIONAL REFILLS  . digoxin (LANOXIN) 0.125 MG tablet Take 1 tablet (0.125 mg total) by mouth  daily.  . diphenhydramine-acetaminophen (TYLENOL PM) 25-500 MG TABS Take 2 tablets by mouth at bedtime.   . ENSURE (ENSURE) Take 1 Can by mouth daily.  . furosemide (LASIX) 40 MG tablet Take 1 tablet (40 mg total) by mouth daily.  . Methylcellulose, Laxative, (CITRUCEL) 500 MG TABS Take 1,000 mg by mouth at bedtime.  . metoprolol (LOPRESSOR) 50 MG tablet Take 75 mg by mouth 2 (two) times daily.   . midodrine (PROAMATINE) 5 MG tablet TAKE 1 TABLET (5 MG TOTAL) BY MOUTH 2 (TWO) TIMES DAILY.  . niacin (NIASPAN) 500 MG CR tablet TAKE 1 TABLET (500 MG TOTAL) BY MOUTH AT BEDTIME.  . Probiotic Product (CVS SENIOR PROBIOTIC PO) Take 1 capsule by mouth at bedtime. Once daily  . Tamsulosin HCl (FLOMAX) 0.4 MG CAPS Take 0.4 mg by mouth at bedtime.   . Tiotropium Bromide-Olodaterol (STIOLTO RESPIMAT) 2.5-2.5 MCG/ACT AERS Inhale 2 puffs into the lungs daily.   No current facility-administered medications on file prior to visit.      No Known Allergies  Review Of Systems:  Constitutional:   No  weight loss, night sweats,  Fevers, chills, fatigue, or  lassitude.  HEENT:   No headaches,  Difficulty swallowing,  Tooth/dental problems, or  Sore throat,                No sneezing, itching, ear ache, nasal congestion, post nasal drip,   CV:  No chest pain,  Orthopnea, PND, swelling in lower extremities, anasarca, dizziness, palpitations, syncope.   GI  No heartburn, indigestion, abdominal pain, nausea, vomiting, diarrhea, change in bowel habits, loss of appetite, bloody stools.   Resp: + shortness of breath with exertion not at rest.  No excess mucus, no productive cough,  No non-productive cough,  No coughing up of blood.  No change in color of mucus.  No wheezing.  No chest wall deformity  Skin: no rash or lesions.  GU: no dysuria, change in color of urine, no urgency or frequency.  No flank pain, no hematuria   MS:  No joint pain or swelling.  No decreased range of motion.  No back pain.  Psych:   No change in mood or affect. No depression or anxiety.  No memory loss.   Vital Signs BP 116/70 (BP Location: Left Arm, Patient Position: Sitting, Cuff Size: Normal)   Pulse 62   Ht 5\' 7"  (1.702 m)   Wt 185 lb 9.6 oz (84.2 kg)   SpO2 95%   BMI 29.07 kg/m    Physical Exam:  General- No distress,  A&Ox3, pleasant, HOH ENT: No sinus tenderness, TM clear, pale nasal mucosa, no oral exudate,no post nasal drip, no LAN Cardiac: S1, S2, regular rate and rhythm, no murmur Chest: No wheeze/ rales/ dullness;  no accessory muscle use, no nasal flaring, no sternal retractions, diminished per bases Abd.: Soft Non-tender, BS+, Non-distended Ext: No clubbing cyanosis, edema Neuro:  normal strength Skin: No rashes, warm and dry Psych: normal mood and behavior   Assessment/Plan  COPD (chronic obstructive pulmonary disease) Resolved exacerbation after CHF flare Plan: I am glad you are doing so much better since you got out of the hospital. Continue Stialto 2 puffs once daily. Rinse mouth after use. Continue Lasix as cardiology prescribed. Continue weighing yourself daily. Continue pre-treatment with your ProAir before exertional activity. Follow up with Dr. Lamonte Sakai in 4 months. Please contact office for sooner follow up if symptoms do not improve or worsen or seek emergency care       Magdalen Spatz, NP 07/16/2017  2:57 PM

## 2017-07-16 NOTE — Assessment & Plan Note (Signed)
Resolved exacerbation after CHF flare Plan: I am glad you are doing so much better since you got out of the hospital. Continue Stialto 2 puffs once daily. Rinse mouth after use. Continue Lasix as cardiology prescribed. Continue weighing yourself daily. Continue pre-treatment with your ProAir before exertional activity. Follow up with Dr. Lamonte Sakai in 4 months. Please contact office for sooner follow up if symptoms do not improve or worsen or seek emergency care

## 2017-07-16 NOTE — Patient Outreach (Signed)
Transition of care: Placed call to patient for transition of care. No answer.   Noted patient saw primary MD today.   PLAN: will update assigned case manager. Patient will continue to get weekly transition of care calls.  Tomasa Rand, RN, BSN, CEN [facility] [employer] [phone]

## 2017-07-16 NOTE — Patient Instructions (Addendum)
It is good to see you today. I am glad you are doing so much better since you got out of the hospital. Continue Stialto 2 puffs once daily. Rinse mouth after use. Continue Lasix as cardiology prescribed. Continue weighing yourself daily. Continue pre-treatment with your ProAir before exertional activity. Follow up with Dr. Lamonte Sakai in 4 months. Please contact office for sooner follow up if symptoms do not improve or worsen or seek emergency care

## 2017-07-16 NOTE — Addendum Note (Signed)
Addended by: Valerie Salts on: 07/16/2017 03:17 PM   Modules accepted: Orders

## 2017-07-17 ENCOUNTER — Other Ambulatory Visit: Payer: Self-pay

## 2017-07-17 ENCOUNTER — Other Ambulatory Visit: Payer: Self-pay | Admitting: Pharmacist

## 2017-07-17 NOTE — Patient Outreach (Signed)
[facility]) Care Management  [place]   07/17/2017  [name] [birth year] [id number]  Subjective:  Patient was referred to [place] Pharmacist by [facility] Fuller [name], for medication patient assistance evaluation.    Patient has past medical history including but not limited to:  Paroxysmal atrial flutter, CAD, COPD, chronic kidney disease, hyperlipidemia.   He was admitted at [facility] 6/17-6/19/18 with shortness of breath.   Successful phone outreach to his daughter, [name], on [facility] Consent Form and noted to be contact per [name].    [name] reports she fills pill boxes for patient and ensures patient takes medications.  She denies questions/concerns/needs with pill box fills/medication management.    Objective:   Current Medications: Current Outpatient Prescriptions  Medication Sig Dispense Refill  . albuterol (PROVENTIL HFA;VENTOLIN HFA) 108 (90 Base) MCG/ACT inhaler Inhale 2 puffs into the lungs every 4 (four) hours as needed for wheezing or shortness of breath. 3 Inhaler 0  . apixaban (ELIQUIS) 5 MG TABS tablet Take 1 tablet (5 mg total) by mouth every 12 (twelve) hours. 180 tablet 2  . atorvastatin (LIPITOR) 40 MG tablet Take 1 tablet (40 mg total) by mouth daily. PLEASE CONTACT OFFICE FOR ADDITIONAL REFILLS 90 tablet 0  . digoxin (LANOXIN) 0.125 MG tablet Take 1 tablet (0.125 mg total) by mouth daily. 90 tablet 3  . diphenhydramine-acetaminophen (TYLENOL PM) 25-500 MG TABS Take 2 tablets by mouth at bedtime.     . furosemide (LASIX) 40 MG tablet Take 1 tablet (40 mg total) by mouth daily. 30 tablet 1  . Methylcellulose, Laxative, (CITRUCEL) 500 MG TABS Take 1,000 mg by mouth at bedtime.    . metoprolol (LOPRESSOR) 50 MG tablet Take 75 mg by mouth 2 (two) times daily.     . midodrine (PROAMATINE) 5 MG tablet TAKE 1 TABLET (5 MG TOTAL) BY MOUTH 2 (TWO) TIMES DAILY. 60 tablet 4  . niacin (NIASPAN) 500 MG CR tablet TAKE 1  TABLET (500 MG TOTAL) BY MOUTH AT BEDTIME. 90 tablet 0  . Probiotic Product ([employer]S SENIOR PROBIOTIC PO) Take 1 capsule by mouth at bedtime. Once daily    . Tamsulosin HCl (FLOMAX) 0.4 MG CAPS Take 0.4 mg by mouth at bedtime.     . Tiotropium Bromide-Olodaterol (STIOLTO RESPIMAT) 2.5-2.5 MCG/ACT AERS Inhale 2 puffs into the lungs daily. 1 Inhaler 0  . ENSURE (ENSURE) Take 1 Can by mouth daily.     No current facility-administered medications for this visit.     Functional Status: In your present state of health, do you have any difficulty performing the following activities: 06/19/2017 06/17/2017  Hearing? [name]  Vision? N N  Difficulty concentrating or making decisions? N N  Walking or climbing stairs? Y Y  Dressing or bathing? N N  Doing errands, shopping? [name]  Preparing Food and eating ? N -  Using the Toilet? N -  In the past six months, have you accidently leaked urine? N -  Do you have problems with loss of bowel control? N -  Managing your Medications? Y -  Managing your Finances? Y -  Housekeeping or managing your Housekeeping? Y -  Some recent data might be hidden    Fall/Depression Screening: Fall Risk  07/01/2017 06/19/2017 11/09/2015  Falls in the past year? - No No  Risk for fall due to : Impaired mobility - -  Risk for fall due to (comments): has cane " has bad knee" - -  PHQ 2/9 Scores 07/01/2017 04/01/2014  PHQ - 2 Score 0 0    Assessment:  Medication review per patient's daughter report, comparison with discharge medication list from 06/17/17 and medication list in this chart.  Patient was recently discharged from hospital and all medications have been reviewed.  Drugs sorted by system:  Cardiovascular: -apixaban (Eliquis)  -atorvastatin  -digoxin -furosemide---dose was increased on discharge  -metoprolol tartrate  -midodrine ---dose was decreased by Dr Claiborne Billings 06/30/17 -niacin   Pulmonary/Allergy: -albuterol inhaler -tiotropium/olodaterol (Stiolto)    Gastrointestinal: -probiotic   Pain: -diphenhydramine/acetaminophen   Miscellaneous: -tamsulosin   Medications to avoid in the elderly:  -diphenhydramine---counseled on increased risk of ADRs such as falls, dizziness, drowsiness with diphenhydramine in >81 year old population   Medication assistance: [name] requested patient's daughter [name], also on [facility] Consent form be called, as she handles patient's finances.  Spoke with [name].    Discussed SSA Extra Help requirements---she reports patient's income may be close to limits. She reports patient also gets a [place] benefit.  Reports patient doesn't see [facility] provider or obtain medications from [place].    Discussed unsure if patient would be able to qualify for manufacturer assistance if he has VA benefits.  [name] to review patient's gross income and call [facility] Pharmacist back.    Plan:  Will await return call from patient's daughter, [name], regarding patient's income information.   If no return call, will make an outreach attempt to [place] next week.   Will route this note to patient's PCP.    [name], PharmD, [place] [phone]

## 2017-07-17 NOTE — Patient Outreach (Signed)
Transition of care:  Attempted to reach patient again for transition of care. Unsuccessful.  PLAN: [facility] care manager will continue to contact patient for transition of care.  Tomasa Rand, RN, BSN, CEN [facility] [employer] [phone]

## 2017-07-22 ENCOUNTER — Other Ambulatory Visit: Payer: Self-pay | Admitting: Pharmacist

## 2017-07-22 ENCOUNTER — Other Ambulatory Visit: Payer: Self-pay | Admitting: *Deleted

## 2017-07-22 NOTE — Patient Outreach (Signed)
[facility]) Care Management  07/22/2017  [name] [birth year] [id number]   Transition of care call  Spoke with patient daughter Lance, Fuller reports continues to manage well at home, he is back to his baseline. She discussed patient continues taking his medications daily as prescribed, using albuterol inhaler prior to activity. Patient continues with his senior activities of chair volley ball and bowling as well as he is back to  tolerating working on small wood projects in his shed.   Patient continues to weigh daily and weights are staying in the 179 to 180 without, increase in swelling or shortness of breath. Daughter discussed patient has new hearing aids and is adjusting to them, has office visit this week to make further device adjustments. Daughter denies any new concerns at this time. Patient has completed 31 days of transition of care , no readmissions, will continue to follow for complex care management per program. Reinforced with daughter to notify me of any new concerns and MD of new symptoms or worsening of condition.  Plan Will follow up with patient in the next month as part of complex care management transition of care .  [facility] CM Care Plan Problem One     Most Recent Value  Care Plan Problem One  Patient with recent hospital admission related to heart failure   Role Documenting the Problem One  Care Management [place] for Problem One  Active  [facility]N Long Term Goal   Patient will not experience a hospital admission in the next 60 days   [facility] Long Term Goal Start Date  06/19/17  Interventions for Problem One Long Term Goal  Encouraged to continue to take medications as prescribed, continue daily weights and notify MD of new worsening of symptoms of Heart failure or COPD  [facility] CM Short Term Goal #1   Patient will attend all medical appointments in the next 30 days   [facility] CM Short Term Goal #1 Start Date  06/19/17  [facility] CM Short Term  Goal #1 Met Date  07/08/17  [facility] CM Short Term Goal #2   Patient will weigh daily and keep a record in the next 30 days  [facility] CM Short Term Goal #2 Start Date  06/19/17  [facility] CM Short Term Goal #2 Met Date  07/08/17  [facility] CM Short Term Goal #3  Patient/caregiver will report increase knowledge of Heart failure  Zone and action plans in the next 30 days   [facility] CM Short Term Goal #3 Start Date  06/19/17  [facility] CM Short Term Goal #3 Met Date  07/22/17       Lance Draft, RN, [place] Care Management [phone]- Mobile [phone]- [place]

## 2017-07-22 NOTE — Patient Outreach (Signed)
[facility]) Care Management  07/22/2017  [name] 1929/05/22 [id number]  Successful phone outreach to patient's daughter, [name], on [facility] Consent form.    [name] reports she reviewed patient's income and it exceeds SSA Extra Help requirements per patient's daughter.   Discussed with patient's daughter he could look at applying to manufacturer patient assistance for Eliquis ([place] patient assistance) and Mining engineer ([place]).  Daughter reports she has discussed with patient/sister and they don't wish to apply to manufacturer assistance programs at this time.   Last week, daughter [name], had denied pharmacy related questions/concerns with patient's medications.  Today [name], other daughter, denies further pharmacy related needs with regards to medication assistance.   Plan:  Pharmacy case will be closed---daughters have [facility] Pharmacist contact information and are aware they can contact [facility] if they wish to consider applying for manufacturer assistance at a later time.   Will update [facility] RN [name].    [name], PharmD, [place] [phone]

## 2017-07-29 DIAGNOSIS — H6042 Cholesteatoma of left external ear: Secondary | ICD-10-CM | POA: Diagnosis not present

## 2017-07-29 DIAGNOSIS — H903 Sensorineural hearing loss, bilateral: Secondary | ICD-10-CM | POA: Diagnosis not present

## 2017-07-29 DIAGNOSIS — H6122 Impacted cerumen, left ear: Secondary | ICD-10-CM | POA: Diagnosis not present

## 2017-08-05 DIAGNOSIS — H43821 Vitreomacular adhesion, right eye: Secondary | ICD-10-CM | POA: Diagnosis not present

## 2017-08-05 DIAGNOSIS — H353112 Nonexudative age-related macular degeneration, right eye, intermediate dry stage: Secondary | ICD-10-CM | POA: Diagnosis not present

## 2017-08-05 DIAGNOSIS — H353122 Nonexudative age-related macular degeneration, left eye, intermediate dry stage: Secondary | ICD-10-CM | POA: Diagnosis not present

## 2017-08-05 DIAGNOSIS — H353212 Exudative age-related macular degeneration, right eye, with inactive choroidal neovascularization: Secondary | ICD-10-CM | POA: Diagnosis not present

## 2017-08-07 DIAGNOSIS — H6122 Impacted cerumen, left ear: Secondary | ICD-10-CM | POA: Diagnosis not present

## 2017-08-07 DIAGNOSIS — H903 Sensorineural hearing loss, bilateral: Secondary | ICD-10-CM | POA: Diagnosis not present

## 2017-08-07 DIAGNOSIS — H6042 Cholesteatoma of left external ear: Secondary | ICD-10-CM | POA: Diagnosis not present

## 2017-08-11 ENCOUNTER — Other Ambulatory Visit: Payer: Self-pay | Admitting: Internal Medicine

## 2017-08-11 ENCOUNTER — Other Ambulatory Visit: Payer: Self-pay | Admitting: Cardiovascular Disease

## 2017-08-11 DIAGNOSIS — I4892 Unspecified atrial flutter: Secondary | ICD-10-CM

## 2017-08-12 DIAGNOSIS — Z9181 History of falling: Secondary | ICD-10-CM | POA: Diagnosis not present

## 2017-08-12 DIAGNOSIS — Z125 Encounter for screening for malignant neoplasm of prostate: Secondary | ICD-10-CM | POA: Diagnosis not present

## 2017-08-12 DIAGNOSIS — Z136 Encounter for screening for cardiovascular disorders: Secondary | ICD-10-CM | POA: Diagnosis not present

## 2017-08-12 DIAGNOSIS — Z0000 Encounter for general adult medical examination without abnormal findings: Secondary | ICD-10-CM | POA: Diagnosis not present

## 2017-08-12 DIAGNOSIS — Z1389 Encounter for screening for other disorder: Secondary | ICD-10-CM | POA: Diagnosis not present

## 2017-08-12 DIAGNOSIS — E785 Hyperlipidemia, unspecified: Secondary | ICD-10-CM | POA: Diagnosis not present

## 2017-08-14 ENCOUNTER — Other Ambulatory Visit: Payer: Self-pay | Admitting: Cardiovascular Disease

## 2017-08-19 ENCOUNTER — Other Ambulatory Visit: Payer: Self-pay | Admitting: *Deleted

## 2017-08-19 ENCOUNTER — Encounter: Payer: Self-pay | Admitting: *Deleted

## 2017-08-19 DIAGNOSIS — Z961 Presence of intraocular lens: Secondary | ICD-10-CM | POA: Diagnosis not present

## 2017-08-19 DIAGNOSIS — H353211 Exudative age-related macular degeneration, right eye, with active choroidal neovascularization: Secondary | ICD-10-CM | POA: Diagnosis not present

## 2017-08-19 DIAGNOSIS — H353121 Nonexudative age-related macular degeneration, left eye, early dry stage: Secondary | ICD-10-CM | POA: Diagnosis not present

## 2017-08-19 NOTE — Patient Outreach (Signed)
[facility]) Care Management  08/19/2017  [name] 1929-02-24 [id number]   Telephone follow up   Spoke with Andee Poles, daughter of patient on his behalf, she reports that patient is still managing well at home.  Patient continues to weigh daily, no sudden weight gain or swelling, gained 2 pounds over a month. Daughter is able to state worsening symptoms of Heart failure to notify MD of.  Patient is able to continue his routine activity at home and attend senior activities 3 days a week. Daughter reports patient breathing is at his baseline, no increase in shortness of breath episodes, he does has some shortness of breath after some activity but resolved after resting.   Daughter denies any new concerns at this time, discussed case closure and agreeable, goals have been met. Reviewed [facility] care management  contact information if future needs arise.    Plan Will close case per protocol, will send CMA in basket message and notify MD by letter.   Joylene Draft, RN, [place] Management Coordinator  [phone]- Mobile [phone]- Toll Free Main Office

## 2017-08-21 ENCOUNTER — Other Ambulatory Visit: Payer: Self-pay | Admitting: Cardiovascular Disease

## 2017-08-25 ENCOUNTER — Telehealth: Payer: Self-pay | Admitting: Cardiovascular Disease

## 2017-08-25 NOTE — Telephone Encounter (Signed)
New Message    Pt c/o swelling: STAT is pt has developed SOB within 24 hours  1. How long have you been experiencing swelling? 1 month  2. Where is the swelling located?ankle  3.  Are you currently taking a "fluid pill"? yes  4.  Are you currently SOB?  Has pocd , not sure if its weather or this   5.  Have you traveled recently? No   Pt has gained 6lbs in 1 month , but his left foot and ankle is swollen

## 2017-08-25 NOTE — Telephone Encounter (Signed)
Spoke with pt dtr, the patient is up about 5-6 lbs in 2 months. He has swelling in his left foot and ankle, no orthopnea and no change in his SOB. She was given the okay to give him an extra furosemide as needed for the next 3 days to help with the swelling. She will call if no change.

## 2017-09-02 ENCOUNTER — Telehealth: Payer: Self-pay | Admitting: Cardiovascular Disease

## 2017-09-02 MED ORDER — FUROSEMIDE 40 MG PO TABS: ORAL_TABLET | ORAL | 3 refills | Status: DC

## 2017-09-02 NOTE — Telephone Encounter (Signed)
New message      Pt c/o swelling: STAT is pt has developed SOB within 24 hours  1. How long have you been experiencing swelling?  1 week ago 2. Where is the swelling located?  Left foot/ankle 3.  Are you currently taking a "fluid pill"?  Yes.  Took 2 fluid pills for 3 days and fluid was better.  Now off double fluid pills and fluid is back  4.  Are you currently SOB?  No more than normal.  However, pt has gained 3-4lbs in a week  5.  Have you traveled recently? No

## 2017-09-02 NOTE — Telephone Encounter (Signed)
As long as he continues to have leg swelling.  Continue Lasix but try to increase his morning dose to 60 mg daily.

## 2017-09-02 NOTE — Telephone Encounter (Signed)
Communicated recommendations to patient's daughter, who verbalized understanding. She's aware to call if swelling becomes worse or if pt continues to gain fluid weight. She will track this and also note if any new concerns or symptoms. Rx called to preferred pharmacy.

## 2017-09-02 NOTE — Telephone Encounter (Signed)
Spoke to [place], patient's daughter/DPR contact.  Pt of Dr. Claiborne Billings Has upcoming appt 9/25  Daughter states concern for increased left foot & ankle swelling. States this occurred last week and was instructed (see 8/27 note) to double lasix (given prior permission to increase PRN) x3 days and follow.  Did so, pt has been back down to his 40mg  daily dose x2 days, now has had a return of swelling in ankle and weight increase of 3-4 lbs. Daughter concerned and wanted to see if advised to increase lasix daily dosage.  No increase in SOB, fatigue, etc. Pt only c/o weight gain and swelling.  I advised extra lasix 40mg  today, & that I would route to Dr. Claiborne Billings for further recommendations. Please advise if OK to increase baseline lasix dosing prior to appt?

## 2017-09-04 DIAGNOSIS — H903 Sensorineural hearing loss, bilateral: Secondary | ICD-10-CM | POA: Diagnosis not present

## 2017-09-04 DIAGNOSIS — H6122 Impacted cerumen, left ear: Secondary | ICD-10-CM | POA: Diagnosis not present

## 2017-09-04 DIAGNOSIS — H6042 Cholesteatoma of left external ear: Secondary | ICD-10-CM | POA: Diagnosis not present

## 2017-09-09 ENCOUNTER — Ambulatory Visit (INDEPENDENT_AMBULATORY_CARE_PROVIDER_SITE_OTHER): Payer: Medicare Other | Admitting: *Deleted

## 2017-09-09 ENCOUNTER — Telehealth: Payer: Self-pay | Admitting: Cardiology

## 2017-09-09 DIAGNOSIS — I480 Paroxysmal atrial fibrillation: Secondary | ICD-10-CM | POA: Diagnosis not present

## 2017-09-09 NOTE — Telephone Encounter (Signed)
Spoke with pt and reminded pt of remote transmission that is due today. Pt verbalized understanding.   

## 2017-09-10 ENCOUNTER — Encounter: Payer: Self-pay | Admitting: Cardiology

## 2017-09-10 LAB — CUP PACEART REMOTE DEVICE CHECK
Battery Remaining Longevity: 132 mo
Battery Voltage: 2.79 V
Brady Statistic AP VP Percent: 0 %
Brady Statistic AS VP Percent: 1 %
Implantable Lead Implant Date: 20150206
Implantable Lead Model: 5076
Implantable Pulse Generator Implant Date: 20150206
Lead Channel Impedance Value: 463 Ohm
Lead Channel Pacing Threshold Amplitude: 0.75 V
Lead Channel Setting Pacing Amplitude: 2 V
Lead Channel Setting Pacing Amplitude: 2.5 V
Lead Channel Setting Pacing Pulse Width: 0.4 ms
Lead Channel Setting Sensing Sensitivity: 5.6 mV
MDC IDC LEAD IMPLANT DT: 20150206
MDC IDC LEAD LOCATION: 753859
MDC IDC LEAD LOCATION: 753860
MDC IDC MSMT BATTERY IMPEDANCE: 182 Ohm
MDC IDC MSMT LEADCHNL RA PACING THRESHOLD PULSEWIDTH: 0.4 ms
MDC IDC MSMT LEADCHNL RV IMPEDANCE VALUE: 560 Ohm
MDC IDC MSMT LEADCHNL RV PACING THRESHOLD AMPLITUDE: 0.375 V
MDC IDC MSMT LEADCHNL RV PACING THRESHOLD PULSEWIDTH: 0.4 ms
MDC IDC SESS DTM: 20180912131951
MDC IDC STAT BRADY AP VS PERCENT: 11 %
MDC IDC STAT BRADY AS VS PERCENT: 88 %

## 2017-09-10 NOTE — Progress Notes (Signed)
Remote pacemaker transmission.   

## 2017-09-17 DIAGNOSIS — Z6826 Body mass index (BMI) 26.0-26.9, adult: Secondary | ICD-10-CM | POA: Diagnosis not present

## 2017-09-17 DIAGNOSIS — J441 Chronic obstructive pulmonary disease with (acute) exacerbation: Secondary | ICD-10-CM | POA: Diagnosis not present

## 2017-09-23 ENCOUNTER — Ambulatory Visit (INDEPENDENT_AMBULATORY_CARE_PROVIDER_SITE_OTHER): Payer: Medicare Other | Admitting: Cardiovascular Disease

## 2017-09-23 ENCOUNTER — Encounter: Payer: Self-pay | Admitting: Cardiovascular Disease

## 2017-09-23 ENCOUNTER — Other Ambulatory Visit: Payer: Self-pay | Admitting: Cardiovascular Disease

## 2017-09-23 VITALS — BP 139/68 | HR 80 | Wt 180.2 lb

## 2017-09-23 DIAGNOSIS — I480 Paroxysmal atrial fibrillation: Secondary | ICD-10-CM | POA: Diagnosis not present

## 2017-09-23 DIAGNOSIS — R600 Localized edema: Secondary | ICD-10-CM

## 2017-09-23 DIAGNOSIS — I2510 Atherosclerotic heart disease of native coronary artery without angina pectoris: Secondary | ICD-10-CM

## 2017-09-23 DIAGNOSIS — Z7901 Long term (current) use of anticoagulants: Secondary | ICD-10-CM | POA: Diagnosis not present

## 2017-09-23 DIAGNOSIS — I5032 Chronic diastolic (congestive) heart failure: Secondary | ICD-10-CM

## 2017-09-23 DIAGNOSIS — I951 Orthostatic hypotension: Secondary | ICD-10-CM | POA: Diagnosis not present

## 2017-09-23 DIAGNOSIS — I4892 Unspecified atrial flutter: Secondary | ICD-10-CM

## 2017-09-23 MED ORDER — MIDODRINE HCL 5 MG PO TABS: ORAL_TABLET | ORAL | 4 refills | Status: DC

## 2017-09-23 MED ORDER — FUROSEMIDE 40 MG PO TABS: 60.0000 mg | ORAL_TABLET | Freq: Every day | ORAL | 3 refills | Status: DC

## 2017-09-23 NOTE — Patient Instructions (Signed)
Medication Instructions:  DECREASE midodrine to 1/2 tablet (2.5 mg) two times daily.  Continue furosemide (Lasix) 60 mg daily  Follow-Up: Your physician wants you to follow-up in: 6 MONTHS with Dr. Claiborne Billings. You will receive a reminder letter in the mail two months in advance. If you don't receive a letter, please call our office to schedule the follow-up appointment.   Any Other Special Instructions Will Be Listed Below (If Applicable).     If you need a refill on your cardiac medications before your next appointment, please call your pharmacy.
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
## 2017-10-02 DIAGNOSIS — H903 Sensorineural hearing loss, bilateral: Secondary | ICD-10-CM | POA: Diagnosis not present

## 2017-10-02 DIAGNOSIS — H6122 Impacted cerumen, left ear: Secondary | ICD-10-CM | POA: Diagnosis not present

## 2017-10-02 DIAGNOSIS — H6042 Cholesteatoma of left external ear: Secondary | ICD-10-CM | POA: Diagnosis not present

## 2017-10-23 ENCOUNTER — Telehealth: Payer: Self-pay | Admitting: Cardiovascular Disease

## 2017-10-23 MED ORDER — MIDODRINE HCL 2.5 MG PO TABS: ORAL_TABLET | ORAL | 3 refills | Status: DC

## 2017-10-23 MED ORDER — MIDODRINE HCL 2.5 MG PO TABS: 2.5000 mg | ORAL_TABLET | Freq: Two times a day (BID) | ORAL | 3 refills | Status: DC

## 2017-10-23 NOTE — Telephone Encounter (Signed)
Please call,concerning his Midodrine,this pill is hard to cut in half.

## 2017-10-23 NOTE — Telephone Encounter (Signed)
Spoke with daughter [name]) she states that it is almost impossible to cut midodrine pills in half when she does the rest of the tab is just dust. New rx sent for 2.5mg  tab BID she will call back with any problems

## 2017-10-24 ENCOUNTER — Other Ambulatory Visit: Payer: Self-pay

## 2017-10-27 ENCOUNTER — Other Ambulatory Visit: Payer: Self-pay

## 2017-10-27 MED ORDER — MIDODRINE HCL 2.5 MG PO TABS: 2.5000 mg | ORAL_TABLET | Freq: Two times a day (BID) | ORAL | 3 refills | Status: DC

## 2017-10-29 DIAGNOSIS — H903 Sensorineural hearing loss, bilateral: Secondary | ICD-10-CM | POA: Diagnosis not present

## 2017-10-29 DIAGNOSIS — H6122 Impacted cerumen, left ear: Secondary | ICD-10-CM | POA: Diagnosis not present

## 2017-10-29 DIAGNOSIS — H6042 Cholesteatoma of left external ear: Secondary | ICD-10-CM | POA: Diagnosis not present

## 2017-11-04 DIAGNOSIS — H43821 Vitreomacular adhesion, right eye: Secondary | ICD-10-CM | POA: Diagnosis not present

## 2017-11-04 DIAGNOSIS — H353112 Nonexudative age-related macular degeneration, right eye, intermediate dry stage: Secondary | ICD-10-CM | POA: Diagnosis not present

## 2017-11-04 DIAGNOSIS — H353122 Nonexudative age-related macular degeneration, left eye, intermediate dry stage: Secondary | ICD-10-CM | POA: Diagnosis not present

## 2017-11-04 DIAGNOSIS — H353212 Exudative age-related macular degeneration, right eye, with inactive choroidal neovascularization: Secondary | ICD-10-CM | POA: Diagnosis not present

## 2017-11-11 ENCOUNTER — Other Ambulatory Visit: Payer: Self-pay | Admitting: Cardiovascular Disease

## 2017-11-11 NOTE — Telephone Encounter (Signed)
Rx(s) sent to pharmacy electronically.  

## 2017-11-18 ENCOUNTER — Other Ambulatory Visit: Payer: Self-pay | Admitting: Cardiovascular Disease

## 2017-11-18 NOTE — Telephone Encounter (Signed)
REFILL 

## 2017-11-25 DIAGNOSIS — H903 Sensorineural hearing loss, bilateral: Secondary | ICD-10-CM | POA: Diagnosis not present

## 2017-11-25 DIAGNOSIS — H6122 Impacted cerumen, left ear: Secondary | ICD-10-CM | POA: Diagnosis not present

## 2017-11-25 DIAGNOSIS — H6042 Cholesteatoma of left external ear: Secondary | ICD-10-CM | POA: Diagnosis not present

## 2017-11-27 ENCOUNTER — Ambulatory Visit (INDEPENDENT_AMBULATORY_CARE_PROVIDER_SITE_OTHER): Payer: Medicare Other | Admitting: Emergency Medicine

## 2017-11-27 ENCOUNTER — Encounter: Payer: Self-pay | Admitting: Emergency Medicine

## 2017-11-27 DIAGNOSIS — I2510 Atherosclerotic heart disease of native coronary artery without angina pectoris: Secondary | ICD-10-CM

## 2017-11-27 DIAGNOSIS — J439 Emphysema, unspecified: Secondary | ICD-10-CM | POA: Diagnosis not present

## 2017-11-27 DIAGNOSIS — Z23 Encounter for immunization: Secondary | ICD-10-CM | POA: Diagnosis not present

## 2017-11-27 MED ORDER — AEROCHAMBER MV MISC: 0 refills | Status: DC

## 2017-11-27 NOTE — Patient Instructions (Signed)
Please continue your Stiolto 2 puffs once a day Use albuterol 2 puffs up to every 4 hours if needed for shortness of breath. We will try using a spacer to see if this helps deliver it better.  Flu shot today.  Follow with Dr Lamonte Sakai in 6 months or sooner if you have any problems

## 2017-11-27 NOTE — Progress Notes (Signed)
  HPI:  83 yo man with COPD, CAD/CABG (06) followed by Dr Claiborne Billings.  MGUS followed by oncology, allergic rhinitis with some associated cough.  Chronic hypoxemic respiratory failure.  He was hospitalized in June 2018 for an apparent CHF exacerbation, possibly also some degree of bronchospasm and was treated for both. He is staying active, but is being limited by knee pain. He has been doing well since. Current BD is Stiolto, uses albuterol infrequently.  Daughter is concerned that he is not using his albuterol frequently enough and that he is not exhibiting good technique.                                                             No flowsheet data found.  EXAM :  Vitals:   11/27/17 1440  BP: 118/68  Pulse: 83  SpO2: 95%  Weight: 179 lb 2 oz (81.3 kg)  Height: 5\' 8"  (1.727 m)   Gen: Pleasant,  in no distress,  normal affect  ENT: No lesions,  mouth clear,  oropharynx clear, no postnasal drip  Neck: No JVD, no TMG, no carotid bruits  Lungs: No use of accessory muscles, distant, no wheeze or crackles, diminshed BS in bases   Cardiovascular: RRR, heart sounds normal, no murmur or gallops, no peripheral edema  Musculoskeletal: No deformities, no cyanosis or clubbing  Neuro: alert, non focal  Skin: Warm, no lesions or rashes   COPD (chronic obstructive pulmonary disease) Please continue your Stiolto 2 puffs once a day Use albuterol 2 puffs up to every 4 hours if needed for shortness of breath. We will try using a spacer to see if this helps deliver it better.  Try to observe and improve his HFA technique Flu shot today.  Follow with Dr Lamonte Sakai in 6 months or sooner if you have any problems   Baltazar Apo, MD, PhD 11/27/2017, 2:55 PM [facility] [phone] or if no answer [phone]

## 2017-11-27 NOTE — Assessment & Plan Note (Signed)
Please continue your Stiolto 2 puffs once a day Use albuterol 2 puffs up to every 4 hours if needed for shortness of breath. We will try using a spacer to see if this helps deliver it better.  Try to observe and improve his HFA technique Flu shot today.  Follow with Dr Lamonte Sakai in 6 months or sooner if you have any problems

## 2017-12-01 ENCOUNTER — Other Ambulatory Visit: Payer: Self-pay

## 2017-12-01 MED ORDER — DIGOXIN 125 MCG PO TABS: 0.1250 mg | ORAL_TABLET | Freq: Every day | ORAL | 3 refills | Status: DC

## 2017-12-04 ENCOUNTER — Encounter: Payer: Self-pay | Admitting: Cardiovascular Disease

## 2017-12-04 ENCOUNTER — Other Ambulatory Visit: Payer: Medicare Other

## 2017-12-04 ENCOUNTER — Ambulatory Visit (INDEPENDENT_AMBULATORY_CARE_PROVIDER_SITE_OTHER): Payer: Medicare Other | Admitting: Cardiovascular Disease

## 2017-12-04 ENCOUNTER — Ambulatory Visit: Payer: Medicare Other | Admitting: Hematology & Oncology

## 2017-12-04 VITALS — BP 135/54 | HR 88 | Ht 68.0 in | Wt 178.2 lb

## 2017-12-04 DIAGNOSIS — I4430 Unspecified atrioventricular block: Secondary | ICD-10-CM | POA: Diagnosis not present

## 2017-12-04 DIAGNOSIS — I484 Atypical atrial flutter: Secondary | ICD-10-CM

## 2017-12-04 DIAGNOSIS — J439 Emphysema, unspecified: Secondary | ICD-10-CM

## 2017-12-04 DIAGNOSIS — I4892 Unspecified atrial flutter: Secondary | ICD-10-CM

## 2017-12-04 DIAGNOSIS — Z7901 Long term (current) use of anticoagulants: Secondary | ICD-10-CM

## 2017-12-04 DIAGNOSIS — Z950 Presence of cardiac pacemaker: Secondary | ICD-10-CM | POA: Diagnosis not present

## 2017-12-04 DIAGNOSIS — I951 Orthostatic hypotension: Secondary | ICD-10-CM

## 2017-12-04 DIAGNOSIS — I2510 Atherosclerotic heart disease of native coronary artery without angina pectoris: Secondary | ICD-10-CM | POA: Diagnosis not present

## 2017-12-04 MED ORDER — FUROSEMIDE 40 MG PO TABS: 60.0000 mg | ORAL_TABLET | Freq: Every day | ORAL | 3 refills | Status: DC

## 2017-12-04 MED ORDER — APIXABAN 5 MG PO TABS: 5.0000 mg | ORAL_TABLET | Freq: Two times a day (BID) | ORAL | 1 refills | Status: DC

## 2017-12-04 NOTE — Patient Instructions (Signed)

## 2017-12-06 NOTE — Progress Notes (Signed)
Patient ID: [name], male   DOB: [birth year], 83 y.o.   MRN: [id number]    Cardiology Office Note    Date:  12/06/2017   ID:  [name], DOB 22-Mar-1929, MRN [id number]  PCP:  Cyndi Bender, PA-C  Cardiologist: Shelva Majestic, MD;  Sanda Klein, MD   Chief complaint: Pacemaker check   History of Present Illness:  [name] is a 83 y.o. 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  Last saw Dr. Claiborne Billings in the office in September.  He has not had any serious cardiac problems since that time.  Generally feels well denies problems with dyspnea, angina, palpitations, syncope, falls or dizziness.  His weight at home has been stable in the 178/180 pound range and he has not needed to adjust his dose of diuretics in the last 3 weeks he has not had any bleeding problems.  Pacemaker interrogation shows normal device function. The device generator has approximately 10.5 years of estimated remaining longevity. There is 12 % atrial pacing and 1% ventricular pacing. The overall burden of atrial mode switch has been approximately 30 %.  Most of this appears to be paroxysmal atrial tachycardia in the 100-110 bpm, with only 6 episodes of atrial fibrillation/flutter since June.  These lasted between 2 and 8 hours ventricular rate control was mediocre during atrial fibrillation.   Rate control medication use has been limited by orthostatic hypotension for which he takes Midodrine.  He is on metoprolol 75 mg twice daily and digoxin 0.125 mg daily  His ECG in September showed atrial paced ventricular sensed rhythm.   Past Medical History:  Diagnosis Date  . ACS (acute coronary syndrome) ([place])   . Acute respiratory failure with hypoxia ([place]) 05/2017  . Anemia of renal disease 12/05/2011  . Anemia, iron  deficiency 12/05/2011  . Asthma   . CAS (cerebral atherosclerosis)    Carotid Dopplers, 04/09/2012 - Bilateral Bulb/Proximal ICAs-mild to moderate amount of fibrous plaque of fibrous soft plaque w/o evidence of a significant diameter reduction, dissection, or any other vascular abnormality  . COPD (chronic obstructive pulmonary disease) ([place])   . Coronary heart disease    a. s/p CABG in 2006 with LIMA-LAD, SVG-Cx, SVG-IM-OM b. low-risk NST in 08/2016  . Dysrhythmia 11/09/2013   SVT VS A FLUTTER   . Kidney stones   . MGUS (monoclonal gammopathy of unknown significance) 12/05/2011  . Orthostatic hypotension 09/10/2016  . PAF (paroxysmal atrial fibrillation) ([facility])    a. on Eliquis  . Shortness of breath   . SSS (sick sinus syndrome) ([place])    a. s/p PPM placement in 2015    Past Surgical History:  Procedure Laterality Date  . CARDIAC CATHETERIZATION  12/05/2005   Recommended CABG  . CARDIOVERSION N/A 11/09/2013   Procedure: CARDIOVERSION;  Surgeon: Pixie Casino, MD;  Location: [facility] ENDOSCOPY;  Service: Cardiovascular;  Laterality: N/A;  . CATARACT EXTRACTION    . CORONARY ARTERY BYPASS GRAFT  12/06/2005   x4, LIMA to LAD, vein to distal circumflex, sequential vein to intermediate and obtuse marginal vessel  . INNER EAR SURGERY    . KIDNEY STONE SURGERY    . LOOP RECORDER IMPLANT N/A 11/30/2013   Procedure: LOOP RECORDER IMPLANT;  Surgeon: Sanda Klein, MD;  Location: [facility] CATH LAB;  Service: Cardiovascular;  Laterality: N/A;  . PERMANENT PACEMAKER  INSERTION N/A 02/04/2014   Procedure: PERMANENT PACEMAKER INSERTION;  Surgeon: Sanda Klein, MD;  Location: [facility] CATH LAB;  Service: Cardiovascular;  Laterality: N/A;  . TEE WITHOUT CARDIOVERSION N/A 11/09/2013   Procedure: TRANSESOPHAGEAL ECHOCARDIOGRAM (TEE);  Surgeon: Pixie Casino, MD;  Location: [facility] ENDOSCOPY;  Service: Cardiovascular;  Laterality: N/A;    Outpatient Medications Prior to Visit  Medication Sig Dispense Refill  . albuterol  (PROVENTIL HFA;VENTOLIN HFA) 108 (90 Base) MCG/ACT inhaler Inhale 2 puffs into the lungs every 4 (four) hours as needed for wheezing or shortness of breath. 3 Inhaler 0  . atorvastatin (LIPITOR) 40 MG tablet Take 1 tablet (40 mg total) daily by mouth. 90 tablet 3  . digoxin (LANOXIN) 0.125 MG tablet Take 1 tablet (0.125 mg total) by mouth daily. 90 tablet 3  . diphenhydramine-acetaminophen (TYLENOL PM) 25-500 MG TABS Take 2 tablets by mouth at bedtime.     . ENSURE (ENSURE) Take 1 Can by mouth daily.    . Methylcellulose, Laxative, (CITRUCEL) 500 MG TABS Take 1,000 mg by mouth at bedtime.    . metoprolol (LOPRESSOR) 50 MG tablet Take 75 mg by mouth 2 (two) times daily.     . midodrine (PROAMATINE) 2.5 MG tablet Take 1 tablet (2.5 mg total) by mouth 2 (two) times daily with a meal. T 180 tablet 3  . niacin (NIASPAN) 500 MG CR tablet TAKE 1 TABLET (500 MG TOTAL) BY MOUTH AT BEDTIME. 90 tablet 1  . Probiotic Product ([facility] SENIOR PROBIOTIC PO) Take 1 capsule by mouth at bedtime. Once daily    . Spacer/Aero-Holding Chambers (AEROCHAMBER MV) inhaler Use as instructed 1 each 0  . Tamsulosin HCl (FLOMAX) 0.4 MG CAPS Take 0.4 mg by mouth at bedtime.     . Tiotropium Bromide-Olodaterol (STIOLTO RESPIMAT) 2.5-2.5 MCG/ACT AERS Inhale 2 puffs into the lungs daily. 1 Inhaler 0  . ELIQUIS 5 MG TABS tablet TAKE 1 TABLET (5 MG TOTAL) BY MOUTH EVERY 12 (TWELVE) HOURS. 180 tablet 1  . furosemide (LASIX) 40 MG tablet Take 1.5 tablets (60 mg total) by mouth daily. 45 tablet 3  . metoprolol tartrate (LOPRESSOR) 50 MG tablet TAKE 1 AND 1/2 TABLETS (75 MG TOTAL) BY MOUTH 2 (TWO) TIMES DAILY. 270 tablet 3   No facility-administered medications prior to visit.      Allergies:   Patient has no known allergies.   Social History   Socioeconomic History  . Marital status: Married    Spouse name: None  . Number of children: None  . Years of education: None  . Highest education level: None  Social Needs  . Financial  resource strain: None  . Food insecurity - worry: None  . Food insecurity - inability: None  . Transportation needs - medical: None  . Transportation needs - non-medical: None  Occupational History  . Occupation: retired Arts administrator  . Occupation: maintenence  Tobacco Use  . Smoking status: Former Smoker    Packs/day: 1.00    Years: 47.00    Pack years: 47.00    Types: Cigarettes    Start date: 12/02/1951    Last attempt to quit: 12/30/1993    Years since quitting: 23.9  . Smokeless tobacco: Never Used  . Tobacco comment: quit 20 years ago  Substance and Sexual Activity  . Alcohol use: No    Alcohol/week: 0.0 oz  . Drug use: No  . Sexual activity: None  Other Topics Concern  . None  Social History Narrative  Pt lives with daughter.      Family History:  The patient's family history includes Emphysema in his daughter; Heart attack in his father and mother; Heart disease in his brother and mother; Tuberculosis in his father.   ROS:   Please see the history of present illness.    ROS All other systems reviewed and are negative.   PHYSICAL EXAM:   VS:  BP (!) 135/54 (BP Location: Left Arm, Patient Position: Sitting, Cuff Size: Normal)   Pulse 88   Ht 5\' 8"  (1.727 m)   Wt 178 lb 3.2 oz (80.8 kg)   BMI 27.10 kg/m     General: Alert, oriented x3, no distress, appears elderly and rather frail Head: no evidence of trauma, PERRL, EOMI, no exophtalmos or lid lag, no myxedema, no xanthelasma; normal ears, nose and oropharynx Neck: normal jugular venous pulsations and no hepatojugular reflux; brisk carotid pulses without delay and no carotid bruits Chest: clear to auscultation, no signs of consolidation by percussion or palpation, normal fremitus, symmetrical and full respiratory excursions.  Healthy left subclavian pacemaker site he Cardiovascular: normal position and quality of the apical impulse, regular rhythm, normal first and second heart sounds, no murmurs,  rubs or gallops Abdomen: no tenderness or distention, no masses by palpation, no abnormal pulsatility or arterial bruits, normal bowel sounds, no hepatosplenomegaly Extremities: no clubbing, cyanosis or edema; 2+ radial, ulnar and brachial pulses bilaterally; 2+ right femoral, posterior tibial and dorsalis pedis pulses; 2+ left femoral, posterior tibial and dorsalis pedis pulses; no subclavian or femoral bruits Neurological: grossly nonfocal Psych: Normal mood and affect   Wt Readings from Last 3 Encounters:  12/04/17 178 lb 3.2 oz (80.8 kg)  11/27/17 179 lb 2 oz (81.3 kg)  09/23/17 180 lb 3.2 oz (81.7 kg)      Studies/Labs Reviewed:   EKG:  EKG is ordered today.  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 Atypical atrial flutter ([place])   2. Long term current use of anticoagulant   3. AVB (atrioventricular block)- high grade second degree   4. Pacemaker   5. Coronary artery disease involving native coronary artery of native heart without angina pectoris   6. Pulmonary emphysema, unspecified emphysema type ([place])   7. Orthostatic hypotension      PLAN:  In order of problems listed above:  1. Paroxysmal atypical atrial flutter/atrial fibrillation: After having been in persistent atrial flutter for very long time, the burden of arrhythmia substantially low.  Although rate control is not perfect, will avoid increasing any controlled medications that would increase his  propensity to orthostatic hypotension. 2. Anticoagulation: Compliant with and tolerating Eliquis without serious bleeding complications. CHADSVasc 3 (age 88, CAD). 3. Second degree AV block: This only occurs occasionally and does not often require ventricular pacing. 4. PM: Normal device function.  Remote downloads every 3 months and yearly office visit. 5. CAD s/p CABG: Angina free  6. COPD: This is the  dominant cause of his exertional dyspnea 7. Orthostatic hypotension has responded very well to treatment with Midodrine.  Reminded him to avoid excessive use of diuretics, prolonged orthostasis without moving.   Medication Adjustments/Labs and Tests Ordered: Current medicines are reviewed at length with the patient today.  Concerns regarding medicines are outlined above.  Medication changes, Labs and Tests ordered today are listed in the Patient Instructions below. Patient Instructions  Dr Sallyanne Kuster recommends that you continue on your current medications as directed. Please refer to the Current Medication list given to you today.  Remote monitoring is used to monitor your Pacemaker or ICD from home. This monitoring reduces the number of office visits required to check your device to one time per year. It allows [place] to keep an eye on the functioning of your device to ensure it is working properly. You are scheduled for a device check from home on Tuesday, December 11th, 2018. You may send your transmission at any time that day. If you have a wireless device, the transmission will be sent automatically. After your physician reviews your transmission, you will receive a notification with your next transmission date.  Dr Sallyanne Kuster recommends that you schedule a follow-up appointment in 12 months with a pacemaker check. You will receive a reminder letter in the mail two months in advance. If you don't receive a letter, please call our office to schedule the follow-up appointment.  If you need a refill on  your cardiac medications before your next appointment, please call your pharmacy.   Signed, Sanda Klein, MD  12/06/2017 10:06 AM    [facility] [address], [place], [place]  [address] Phone: [phone]; Fax: [phone]

## 2017-12-09 ENCOUNTER — Encounter: Payer: Medicare Other | Admitting: *Deleted

## 2017-12-11 ENCOUNTER — Other Ambulatory Visit (HOSPITAL_BASED_OUTPATIENT_CLINIC_OR_DEPARTMENT_OTHER): Payer: Medicare Other

## 2017-12-11 ENCOUNTER — Other Ambulatory Visit: Payer: Self-pay

## 2017-12-11 ENCOUNTER — Ambulatory Visit (HOSPITAL_BASED_OUTPATIENT_CLINIC_OR_DEPARTMENT_OTHER): Payer: Medicare Other | Admitting: Hematology & Oncology

## 2017-12-11 ENCOUNTER — Encounter: Payer: Self-pay | Admitting: Hematology & Oncology

## 2017-12-11 VITALS — BP 114/55 | HR 85 | Temp 97.6°F | Resp 18 | Wt 177.0 lb

## 2017-12-11 DIAGNOSIS — D649 Anemia, unspecified: Secondary | ICD-10-CM

## 2017-12-11 DIAGNOSIS — N189 Chronic kidney disease, unspecified: Secondary | ICD-10-CM

## 2017-12-11 DIAGNOSIS — D631 Anemia in chronic kidney disease: Secondary | ICD-10-CM

## 2017-12-11 DIAGNOSIS — D500 Iron deficiency anemia secondary to blood loss (chronic): Secondary | ICD-10-CM

## 2017-12-11 DIAGNOSIS — D472 Monoclonal gammopathy: Secondary | ICD-10-CM

## 2017-12-11 DIAGNOSIS — D509 Iron deficiency anemia, unspecified: Secondary | ICD-10-CM

## 2017-12-11 DIAGNOSIS — I2510 Atherosclerotic heart disease of native coronary artery without angina pectoris: Secondary | ICD-10-CM | POA: Diagnosis not present

## 2017-12-11 LAB — CMP (CANCER CENTER ONLY)
ALT(SGPT): 43 U/L (ref 10–47)
AST: 35 U/L (ref 11–38)
Albumin: 3.6 g/dL (ref 3.3–5.5)
Alkaline Phosphatase: 74 U/L (ref 26–84)
BUN, Bld: 21 mg/dL (ref 7–22)
CO2: 31 meq/L (ref 18–33)
CREATININE: 1.4 mg/dL — AB (ref 0.6–1.2)
Calcium: 9.6 mg/dL (ref 8.0–10.3)
Chloride: 102 mEq/L (ref 98–108)
GLUCOSE: 124 mg/dL — AB (ref 73–118)
Potassium: 4.5 mEq/L (ref 3.3–4.7)
SODIUM: 146 meq/L — AB (ref 128–145)
Total Bilirubin: 1.1 mg/dl (ref 0.20–1.60)
Total Protein: 7.1 g/dL (ref 6.4–8.1)

## 2017-12-11 LAB — CBC WITH DIFFERENTIAL (CANCER CENTER ONLY)
BASO#: 0.2 10*3/uL (ref 0.0–0.2)
BASO%: 2.5 % — AB (ref 0.0–2.0)
EOS%: 5.3 % (ref 0.0–7.0)
Eosinophils Absolute: 0.4 10*3/uL (ref 0.0–0.5)
HCT: 33.7 % — ABNORMAL LOW (ref 38.7–49.9)
HEMOGLOBIN: 11.4 g/dL — AB (ref 13.0–17.1)
LYMPH#: 1.5 10*3/uL (ref 0.9–3.3)
LYMPH%: 21.5 % (ref 14.0–48.0)
MCH: 33.5 pg — ABNORMAL HIGH (ref 28.0–33.4)
MCHC: 33.8 g/dL (ref 32.0–35.9)
MCV: 99 fL — ABNORMAL HIGH (ref 82–98)
MONO#: 0.6 10*3/uL (ref 0.1–0.9)
MONO%: 9.4 % (ref 0.0–13.0)
NEUT%: 61.3 % (ref 40.0–80.0)
NEUTROS ABS: 4.2 10*3/uL (ref 1.5–6.5)
Platelets: 179 10*3/uL (ref 145–400)
RBC: 3.4 10*6/uL — ABNORMAL LOW (ref 4.20–5.70)
RDW: 12.8 % (ref 11.1–15.7)
WBC: 6.8 10*3/uL (ref 4.0–10.0)
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
## 2017-12-12 ENCOUNTER — Encounter: Payer: Self-pay | Admitting: Cardiology

## 2017-12-12 LAB — IRON AND TIBC
%SAT: 30 % (ref 20–55)
Iron: 75 ug/dL (ref 42–163)
TIBC: 251 ug/dL (ref 202–409)
UIBC: 176 ug/dL (ref 117–376)

## 2017-12-12 LAB — ERYTHROPOIETIN: Erythropoietin: 22.6 m[IU]/mL — ABNORMAL HIGH (ref 2.6–18.5)

## 2017-12-12 LAB — KAPPA/LAMBDA LIGHT CHAINS
IG LAMBDA FREE LIGHT CHAIN: 112.2 mg/L — AB (ref 5.7–26.3)
Ig Kappa Free Light Chain: 36.4 mg/L — ABNORMAL HIGH (ref 3.3–19.4)
KAPPA/LAMBDA FLC RATIO: 0.32 (ref 0.26–1.65)

## 2017-12-12 LAB — RETICULOCYTES: Reticulocyte Count: 2 % (ref 0.6–2.6)

## 2017-12-12 LAB — FERRITIN: FERRITIN: 958 ng/mL — AB (ref 22–316)

## 2017-12-12 LAB — IGG, IGA, IGM
IgA, Qn, Serum: 131 mg/dL (ref 61–437)
IgM, Qn, Serum: 325 mg/dL — ABNORMAL HIGH (ref 15–143)

## 2017-12-17 LAB — PROTEIN ELECTROPHORESIS, SERUM, WITH REFLEX
A/G Ratio: 1 (ref 0.7–1.7)
ALPHA 1: 0.2 g/dL (ref 0.0–0.4)
ALPHA 2: 0.9 g/dL (ref 0.4–1.0)
Albumin: 3.4 g/dL (ref 2.9–4.4)
BETA: 1 g/dL (ref 0.7–1.3)
GAMMA GLOBULIN: 1.3 g/dL (ref 0.4–1.8)
GLOBULIN, TOTAL: 3.5 g/dL (ref 2.2–3.9)
INTERPRETATION(SEE BELOW): 0
M-SPIKE, %: 0.4 g/dL — AB
Total Protein: 6.9 g/dL (ref 6.0–8.5)

## 2017-12-18 DIAGNOSIS — H903 Sensorineural hearing loss, bilateral: Secondary | ICD-10-CM | POA: Diagnosis not present

## 2017-12-18 DIAGNOSIS — H6122 Impacted cerumen, left ear: Secondary | ICD-10-CM | POA: Diagnosis not present

## 2017-12-18 DIAGNOSIS — H6042 Cholesteatoma of left external ear: Secondary | ICD-10-CM | POA: Diagnosis not present

## 2017-12-19 ENCOUNTER — Telehealth: Payer: Self-pay | Admitting: Emergency Medicine

## 2017-12-19 ENCOUNTER — Telehealth: Payer: Self-pay | Admitting: Cardiovascular Disease

## 2017-12-19 DIAGNOSIS — I484 Atypical atrial flutter: Secondary | ICD-10-CM

## 2017-12-19 MED ORDER — TIOTROPIUM BROMIDE-OLODATEROL 2.5-2.5 MCG/ACT IN AERS: 2.0000 | INHALATION_SPRAY | Freq: Every day | RESPIRATORY_TRACT | 1 refills | Status: DC

## 2017-12-19 NOTE — Telephone Encounter (Signed)
Spoke with patient's daughter regarding the message for refill on [facility]. RX is sent.

## 2017-12-19 NOTE — Telephone Encounter (Signed)
New Message     *STAT* If patient is at the pharmacy, call can be transferred to refill team.   1. Which medications need to be refilled? (please list name of each medication and dose if known) Eliquis 5mg   2. Which pharmacy/location (including street and city if local pharmacy) is medication to be sent to? [facility]  3. Do they need a 30 day or 90 day supply? 90   Patient is requesting a 90 day supply for the Eliquis. [facility] 90 day supply.

## 2017-12-24 ENCOUNTER — Telehealth: Payer: Self-pay | Admitting: *Deleted

## 2017-12-24 NOTE — Telephone Encounter (Addendum)
Patient's daughter aware of results  ----- Message from [name], MD sent at 12/19/2017  4:36 PM EST ----- Call - his abnormal protein is actually better!!!  Laurey Arrow

## 2018-01-08 DIAGNOSIS — H6122 Impacted cerumen, left ear: Secondary | ICD-10-CM | POA: Diagnosis not present

## 2018-01-08 DIAGNOSIS — H6042 Cholesteatoma of left external ear: Secondary | ICD-10-CM | POA: Diagnosis not present

## 2018-01-08 DIAGNOSIS — H903 Sensorineural hearing loss, bilateral: Secondary | ICD-10-CM | POA: Diagnosis not present

## 2018-01-29 DIAGNOSIS — H903 Sensorineural hearing loss, bilateral: Secondary | ICD-10-CM | POA: Diagnosis not present

## 2018-01-29 DIAGNOSIS — H6122 Impacted cerumen, left ear: Secondary | ICD-10-CM | POA: Diagnosis not present

## 2018-01-29 DIAGNOSIS — H6042 Cholesteatoma of left external ear: Secondary | ICD-10-CM | POA: Diagnosis not present

## 2018-02-09 LAB — CUP PACEART REMOTE DEVICE CHECK
Battery Impedance: 206 Ohm
Brady Statistic AP VP Percent: 0 %
Brady Statistic AP VS Percent: 12 %
Brady Statistic AS VP Percent: 1 %
Brady Statistic AS VS Percent: 87 %
Implantable Lead Implant Date: 20150206
Implantable Lead Location: 753859
Implantable Pulse Generator Implant Date: 20150206
Lead Channel Impedance Value: 463 Ohm
Lead Channel Impedance Value: 586 Ohm
Lead Channel Pacing Threshold Amplitude: 0.375 V
Lead Channel Pacing Threshold Amplitude: 0.875 V
Lead Channel Pacing Threshold Pulse Width: 0.4 ms
Lead Channel Setting Pacing Amplitude: 2.5 V
MDC IDC LEAD IMPLANT DT: 20150206
MDC IDC LEAD LOCATION: 753860
MDC IDC MSMT BATTERY REMAINING LONGEVITY: 128 mo
MDC IDC MSMT BATTERY VOLTAGE: 2.79 V
MDC IDC MSMT LEADCHNL RV PACING THRESHOLD PULSEWIDTH: 0.4 ms
MDC IDC SESS DTM: 20181206155705
MDC IDC SET LEADCHNL RA PACING AMPLITUDE: 2 V
MDC IDC SET LEADCHNL RV PACING PULSEWIDTH: 0.4 ms
MDC IDC SET LEADCHNL RV SENSING SENSITIVITY: 5.6 mV

## 2018-02-18 DIAGNOSIS — H6042 Cholesteatoma of left external ear: Secondary | ICD-10-CM | POA: Diagnosis not present

## 2018-02-18 DIAGNOSIS — H6122 Impacted cerumen, left ear: Secondary | ICD-10-CM | POA: Diagnosis not present

## 2018-02-18 DIAGNOSIS — H903 Sensorineural hearing loss, bilateral: Secondary | ICD-10-CM | POA: Diagnosis not present

## 2018-02-20 DIAGNOSIS — K8020 Calculus of gallbladder without cholecystitis without obstruction: Secondary | ICD-10-CM | POA: Diagnosis not present

## 2018-02-20 DIAGNOSIS — R1032 Left lower quadrant pain: Secondary | ICD-10-CM | POA: Diagnosis not present

## 2018-02-24 DIAGNOSIS — Z6826 Body mass index (BMI) 26.0-26.9, adult: Secondary | ICD-10-CM | POA: Diagnosis not present

## 2018-02-24 DIAGNOSIS — R1032 Left lower quadrant pain: Secondary | ICD-10-CM | POA: Diagnosis not present

## 2018-02-27 IMAGING — CR DG ABDOMEN 1V
2 series · 2 of 2 positions shown · non-contrast
Comparison: 04/18/2016.

CLINICAL DATA: Abdominal distention.  Constipation.

EXAM:
ABDOMEN - 1 VIEW

[AP (1 of 2)]
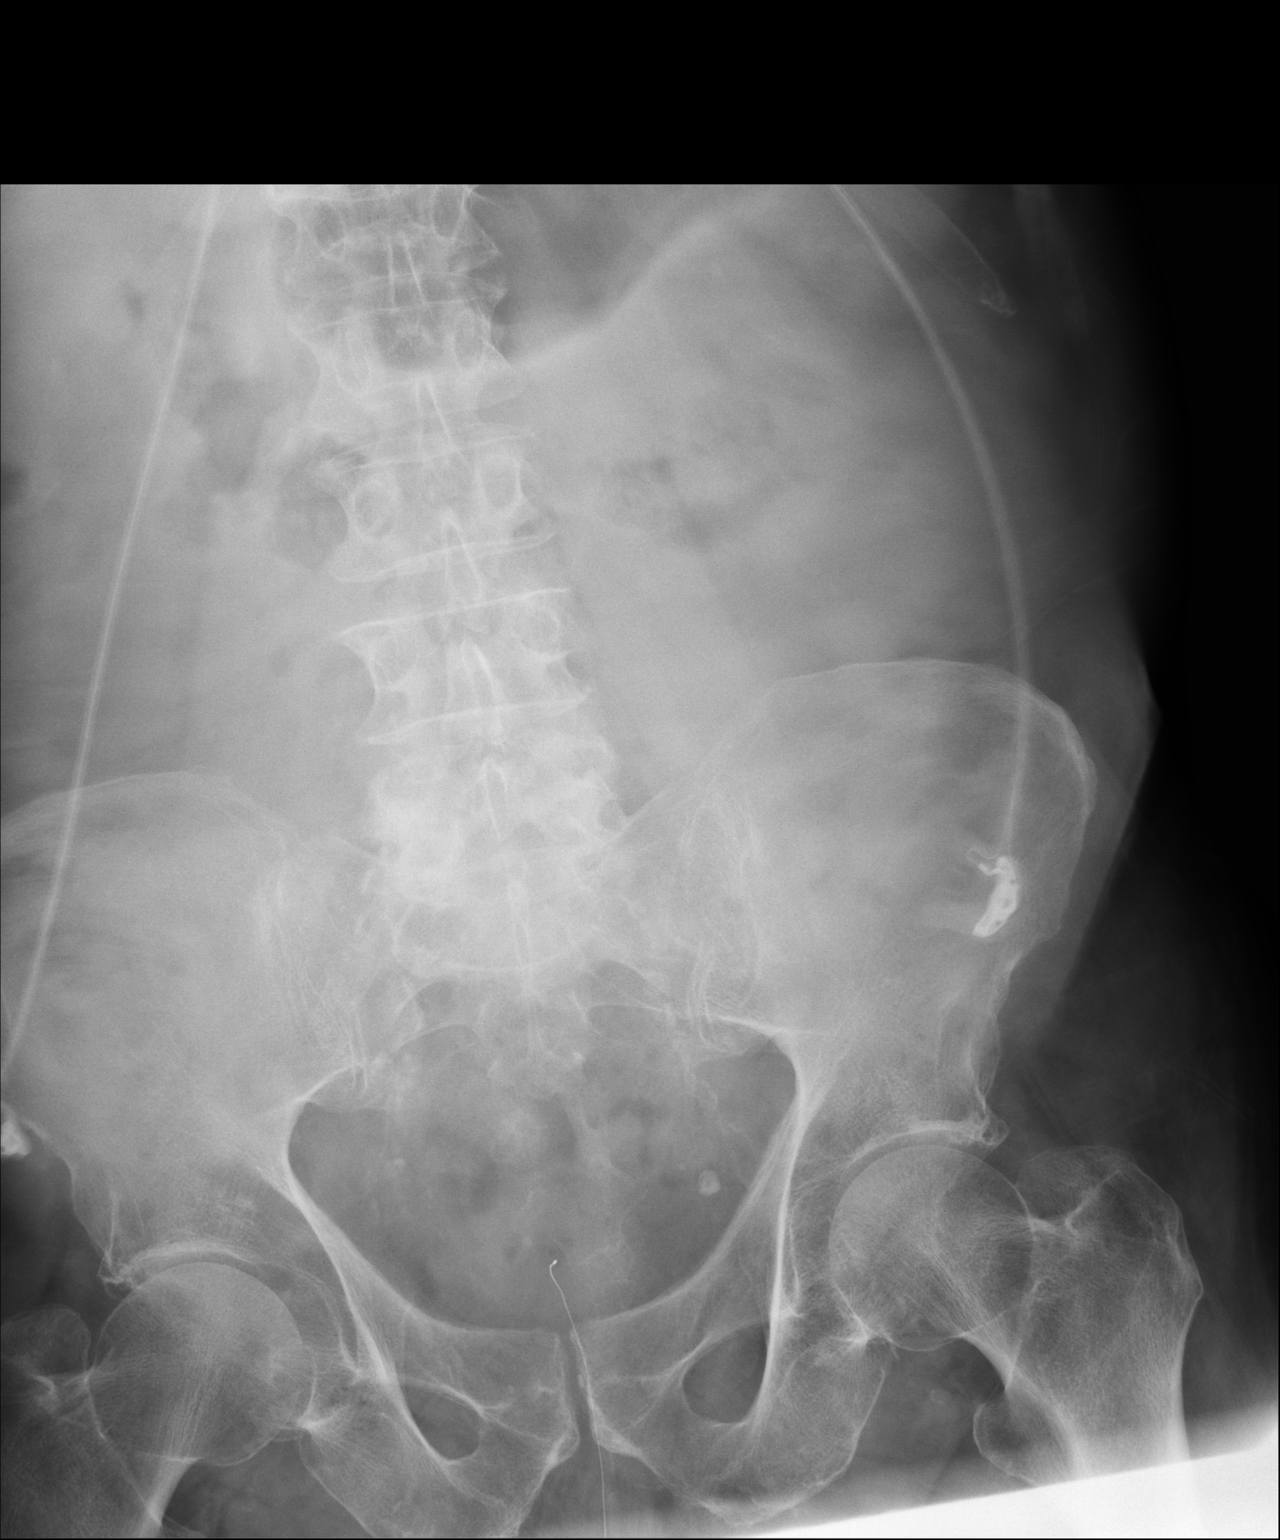

[AP (2 of 2)]
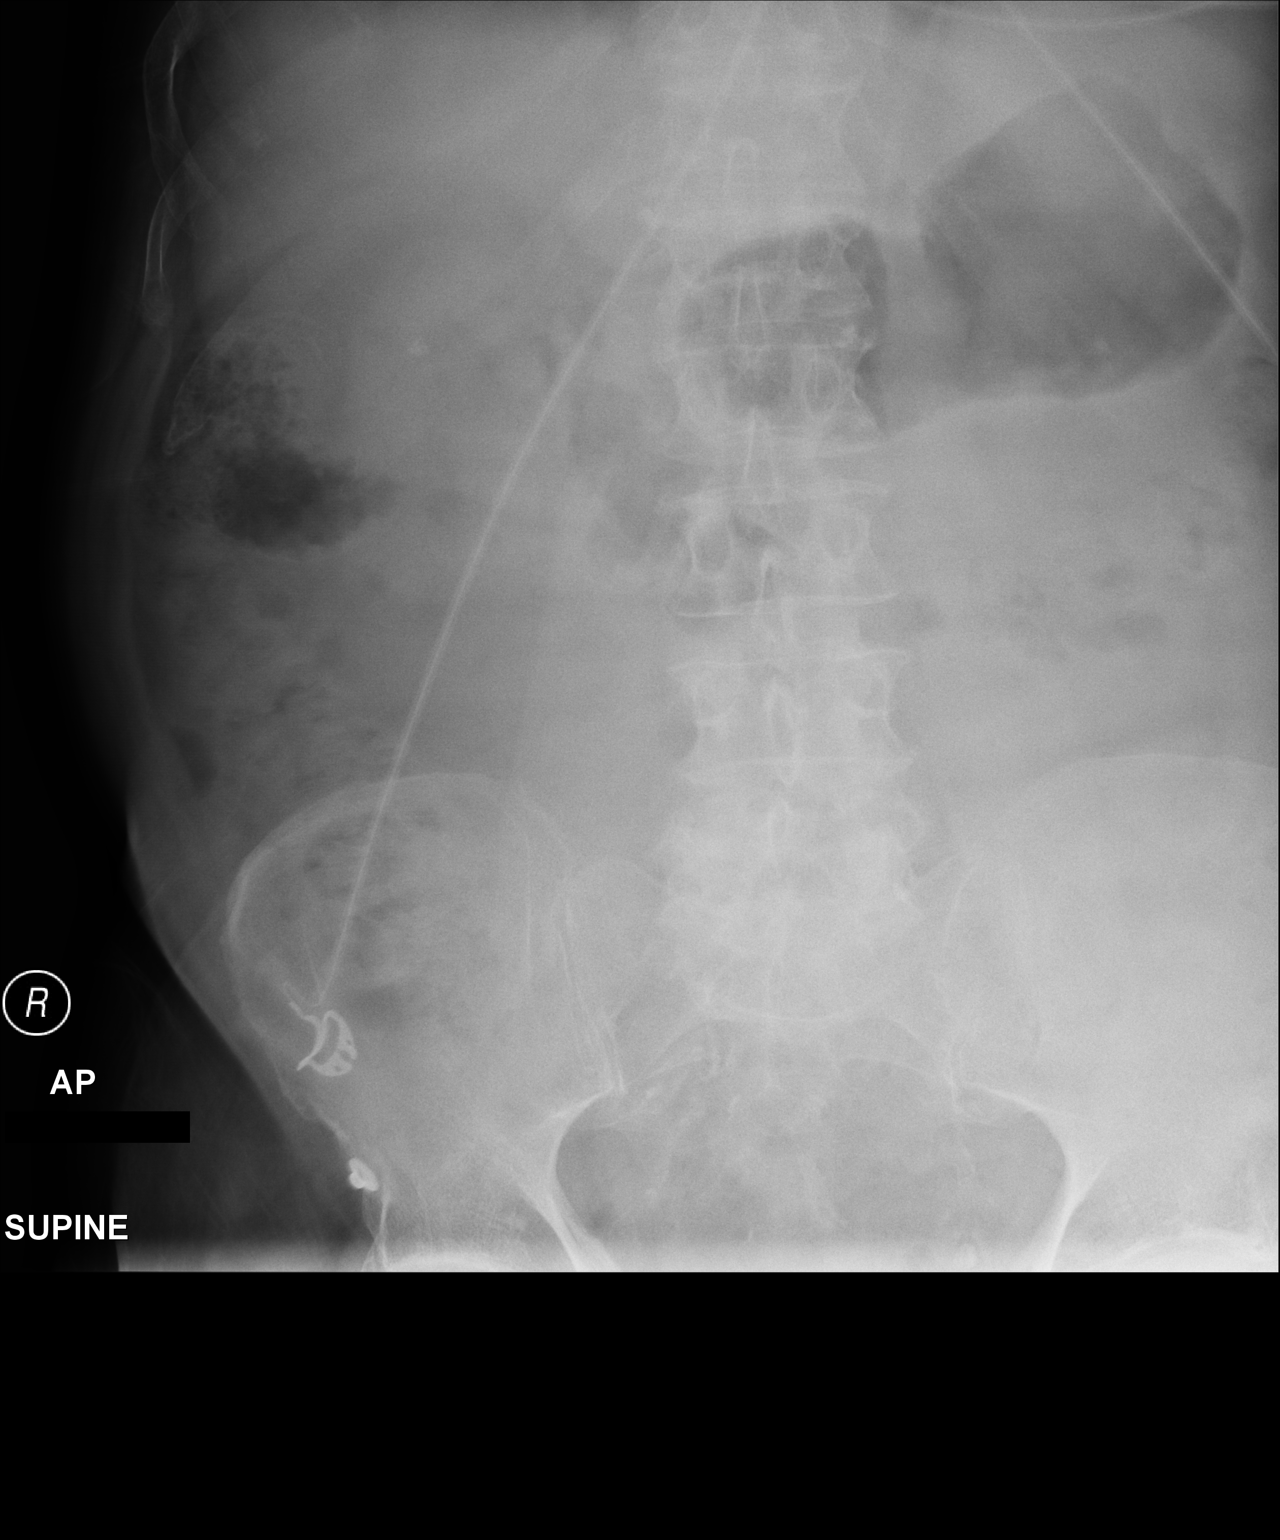

[2 of 2 positions shown; findings below may reference images not displayed]

FINDINGS: Motion blurring. Grossly normal bowel gas pattern. Small bilateral
renal calculi are again demonstrated. Lumbar lower thoracic spine
degenerative changes. Rectal temperature probe.
IMPRESSION: No acute abnormality. Previously demonstrated bilateral renal
calculi.

## 2018-02-28 IMAGING — CR DG CHEST 1V PORT
1 series · 1 of 1 positions shown · non-contrast
Comparison: Chest radiograph performed 12/01/2016

CLINICAL DATA: Acute onset of shortness of breath. Sepsis. Initial
encounter.

EXAM:
PORTABLE CHEST 1 VIEW

[AP]
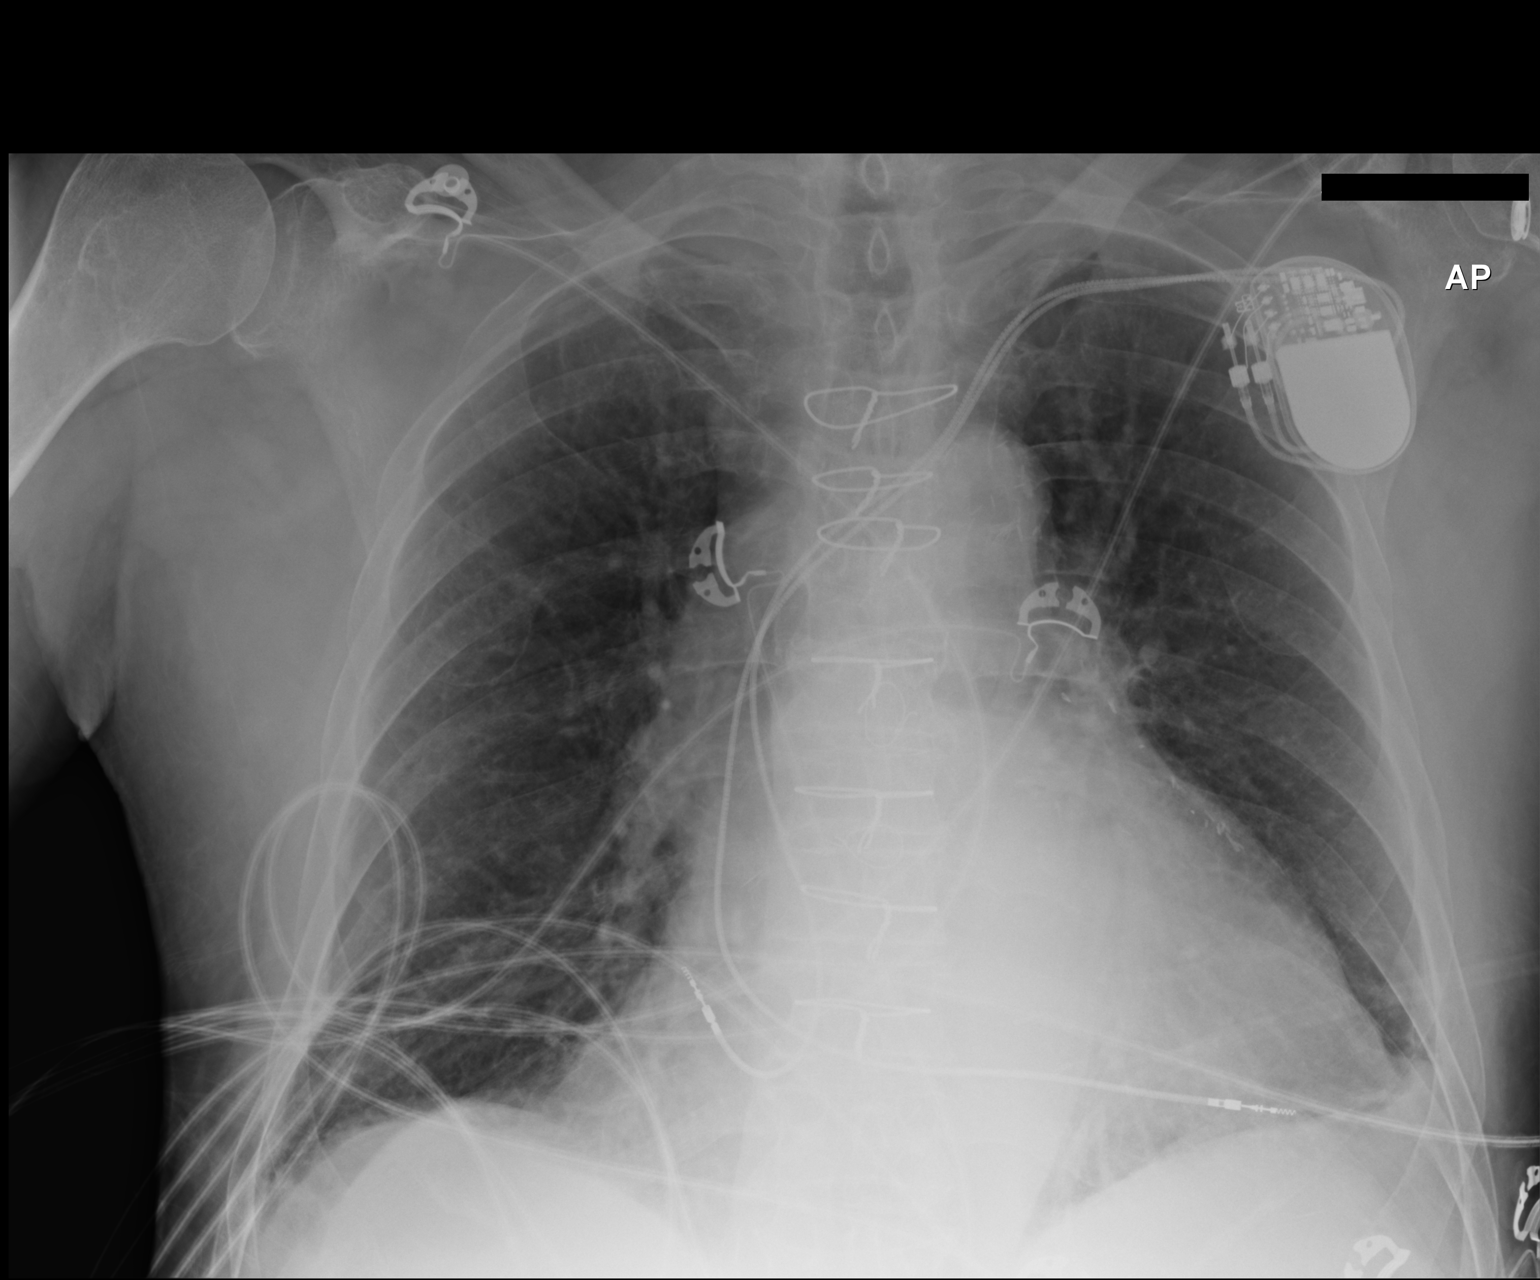

[1 of 1 positions shown; findings below may reference images not displayed]

FINDINGS: The lungs are well-aerated. A small left pleural effusion is noted.
There is no evidence of pneumothorax.

The cardiomediastinal silhouette is mildly enlarged. The patient is
status post median sternotomy. A pacemaker is noted overlying the
left chest wall, with leads ending overlying the right atrium and
right ventricle. No acute osseous abnormalities are seen.
IMPRESSION: Small left pleural effusion noted. Lungs otherwise grossly clear.
Mild cardiomegaly.

## 2018-03-01 IMAGING — DX DG ABDOMEN 1V
3 series · 3 of 3 positions shown · non-contrast
Comparison: 12/01/2016 CT of abdomen and pelvis.

CLINICAL DATA: 87 y/o M; hard uncomfortable abdomen only able to
pass liquid with concern for small bowel obstruction or ileus.

EXAM:
ABDOMEN - 1 VIEW

[abdomen kub (1 of 3)]
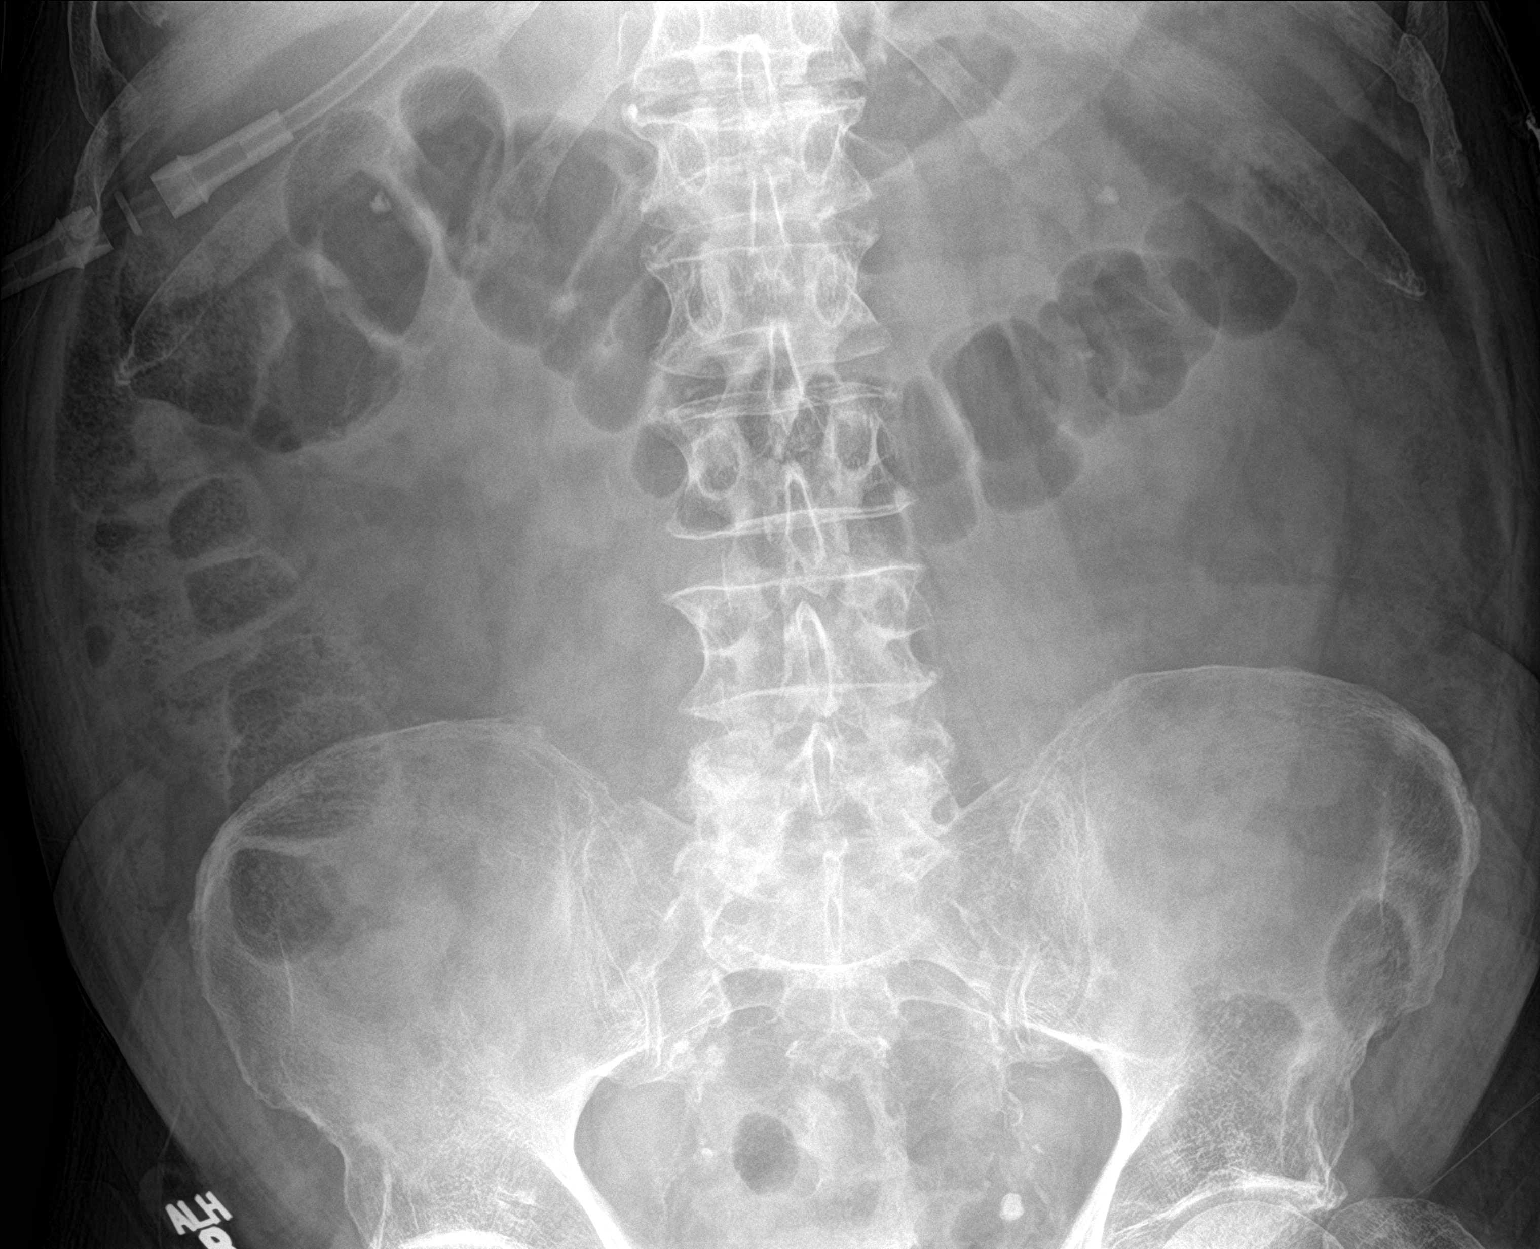

[abdomen kub (2 of 3)]
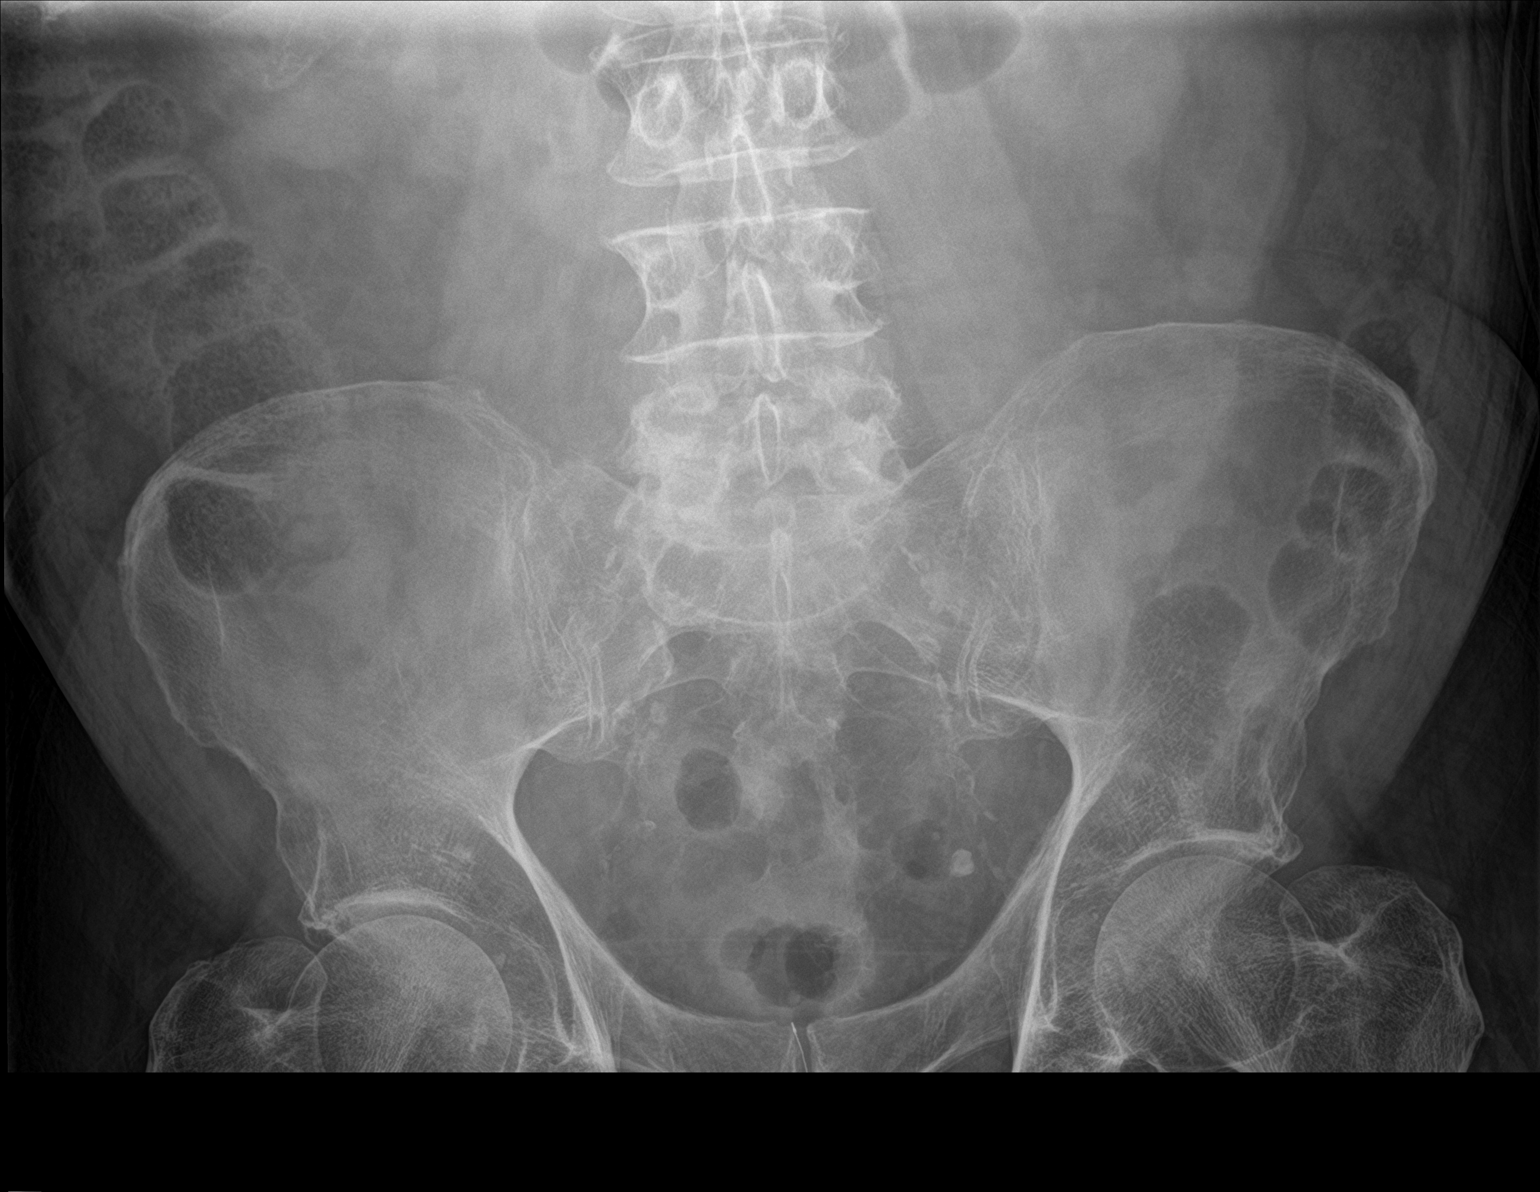

[abdomen kub (3 of 3)]
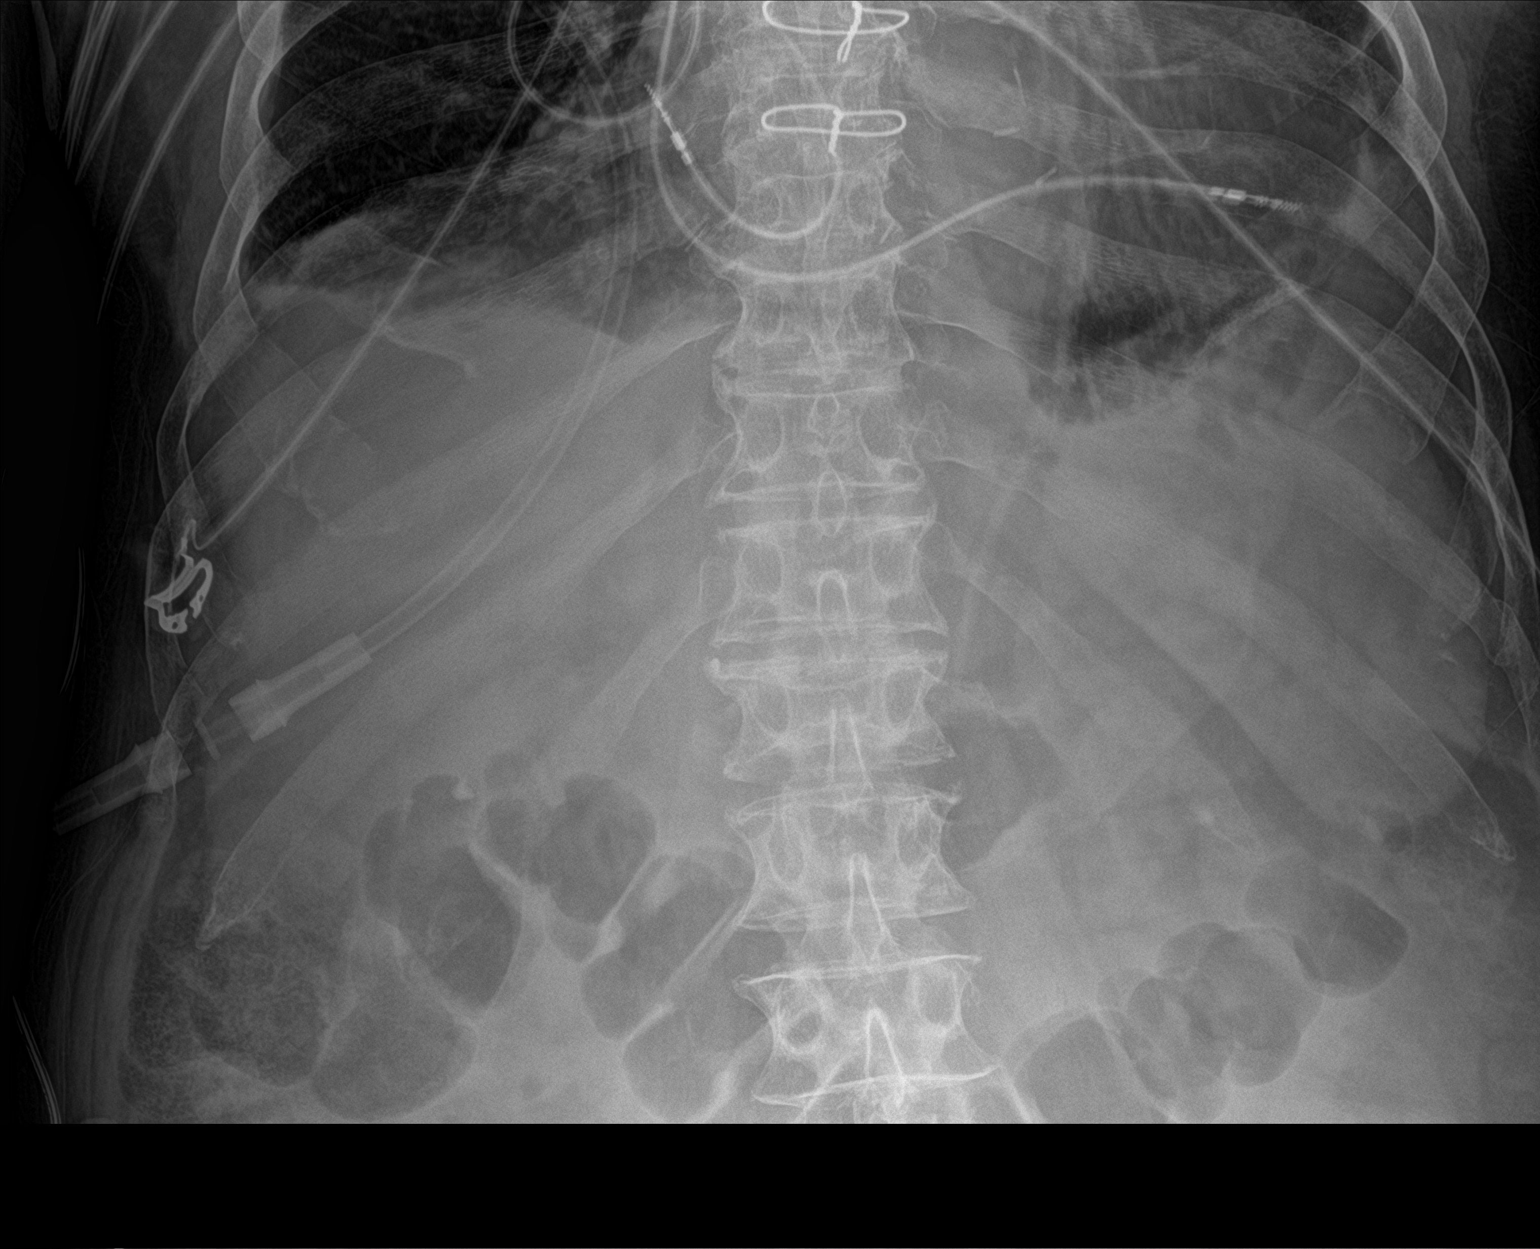

[3 of 3 positions shown; findings below may reference images not displayed]

FINDINGS: Normal bowel gas pattern. Moderate volume of stool throughout the
colon. Multiple renal calculi better characterized on prior CT.
Degenerative changes of lower lumbar spine and mild bilateral hip
osteoarthrosis. Vascular calcifications.
IMPRESSION: Normal bowel gas pattern. Moderate volume of stool throughout the
colon.

By: Jaejun Lipparini M.D.

## 2018-03-06 ENCOUNTER — Other Ambulatory Visit: Payer: Self-pay | Admitting: *Deleted

## 2018-03-06 MED ORDER — ALBUTEROL SULFATE HFA 108 (90 BASE) MCG/ACT IN AERS: 2.0000 | INHALATION_SPRAY | RESPIRATORY_TRACT | 0 refills | Status: DC | PRN

## 2018-03-06 MED ORDER — TIOTROPIUM BROMIDE-OLODATEROL 2.5-2.5 MCG/ACT IN AERS: 2.0000 | INHALATION_SPRAY | Freq: Every day | RESPIRATORY_TRACT | 0 refills | Status: DC

## 2018-03-10 ENCOUNTER — Other Ambulatory Visit: Payer: Self-pay | Admitting: *Deleted

## 2018-03-10 ENCOUNTER — Other Ambulatory Visit: Payer: Self-pay

## 2018-03-10 DIAGNOSIS — I484 Atypical atrial flutter: Secondary | ICD-10-CM

## 2018-03-10 MED ORDER — ATORVASTATIN CALCIUM 40 MG PO TABS: 40.0000 mg | ORAL_TABLET | Freq: Every day | ORAL | 3 refills | Status: DC

## 2018-03-10 MED ORDER — ALBUTEROL SULFATE HFA 108 (90 BASE) MCG/ACT IN AERS: 2.0000 | INHALATION_SPRAY | RESPIRATORY_TRACT | 0 refills | Status: DC | PRN

## 2018-03-10 MED ORDER — METOPROLOL TARTRATE 50 MG PO TABS: 75.0000 mg | ORAL_TABLET | Freq: Two times a day (BID) | ORAL | 3 refills | Status: DC

## 2018-03-10 MED ORDER — FUROSEMIDE 40 MG PO TABS: 60.0000 mg | ORAL_TABLET | Freq: Every day | ORAL | 3 refills | Status: DC

## 2018-03-10 MED ORDER — MIDODRINE HCL 2.5 MG PO TABS: 2.5000 mg | ORAL_TABLET | Freq: Two times a day (BID) | ORAL | 3 refills | Status: DC

## 2018-03-10 MED ORDER — APIXABAN 5 MG PO TABS: 5.0000 mg | ORAL_TABLET | Freq: Two times a day (BID) | ORAL | 1 refills | Status: DC

## 2018-03-10 MED ORDER — DIGOXIN 125 MCG PO TABS: 0.1250 mg | ORAL_TABLET | Freq: Every day | ORAL | 3 refills | Status: DC

## 2018-03-10 MED ORDER — TIOTROPIUM BROMIDE-OLODATEROL 2.5-2.5 MCG/ACT IN AERS: 2.0000 | INHALATION_SPRAY | Freq: Every day | RESPIRATORY_TRACT | 0 refills | Status: DC

## 2018-03-12 DIAGNOSIS — H6042 Cholesteatoma of left external ear: Secondary | ICD-10-CM | POA: Diagnosis not present

## 2018-03-12 DIAGNOSIS — H6122 Impacted cerumen, left ear: Secondary | ICD-10-CM | POA: Diagnosis not present

## 2018-03-12 DIAGNOSIS — H903 Sensorineural hearing loss, bilateral: Secondary | ICD-10-CM | POA: Diagnosis not present

## 2018-03-13 ENCOUNTER — Other Ambulatory Visit: Payer: Self-pay

## 2018-03-13 MED ORDER — NIACIN ER (ANTIHYPERLIPIDEMIC) 500 MG PO TBCR: EXTENDED_RELEASE_TABLET | ORAL | 0 refills | Status: DC

## 2018-03-21 ENCOUNTER — Other Ambulatory Visit: Payer: Self-pay | Admitting: Emergency Medicine

## 2018-03-21 ENCOUNTER — Other Ambulatory Visit: Payer: Self-pay | Admitting: Cardiovascular Disease

## 2018-04-02 DIAGNOSIS — H903 Sensorineural hearing loss, bilateral: Secondary | ICD-10-CM | POA: Diagnosis not present

## 2018-04-02 DIAGNOSIS — H6042 Cholesteatoma of left external ear: Secondary | ICD-10-CM | POA: Diagnosis not present

## 2018-04-02 DIAGNOSIS — H6122 Impacted cerumen, left ear: Secondary | ICD-10-CM | POA: Diagnosis not present

## 2018-04-09 ENCOUNTER — Inpatient Hospital Stay: Payer: Medicare Other

## 2018-04-09 ENCOUNTER — Inpatient Hospital Stay: Payer: Medicare Other | Attending: Hematology & Oncology | Admitting: Family

## 2018-04-09 ENCOUNTER — Other Ambulatory Visit: Payer: Self-pay

## 2018-04-09 ENCOUNTER — Encounter: Payer: Self-pay | Admitting: Family

## 2018-04-09 VITALS — BP 143/74 | HR 84 | Temp 97.5°F | Resp 18 | Wt 181.0 lb

## 2018-04-09 DIAGNOSIS — D500 Iron deficiency anemia secondary to blood loss (chronic): Secondary | ICD-10-CM

## 2018-04-09 DIAGNOSIS — N189 Chronic kidney disease, unspecified: Secondary | ICD-10-CM

## 2018-04-09 DIAGNOSIS — J439 Emphysema, unspecified: Secondary | ICD-10-CM | POA: Diagnosis not present

## 2018-04-09 DIAGNOSIS — D631 Anemia in chronic kidney disease: Secondary | ICD-10-CM | POA: Diagnosis not present

## 2018-04-09 DIAGNOSIS — D509 Iron deficiency anemia, unspecified: Secondary | ICD-10-CM

## 2018-04-09 DIAGNOSIS — D472 Monoclonal gammopathy: Secondary | ICD-10-CM | POA: Diagnosis not present

## 2018-04-09 DIAGNOSIS — Z79899 Other long term (current) drug therapy: Secondary | ICD-10-CM | POA: Diagnosis not present

## 2018-04-09 LAB — CBC WITH DIFFERENTIAL (CANCER CENTER ONLY)
BASOS PCT: 1 %
Basophils Absolute: 0 10*3/uL (ref 0.0–0.1)
EOS ABS: 0.4 10*3/uL (ref 0.0–0.5)
Eosinophils Relative: 9 %
HCT: 34.2 % — ABNORMAL LOW (ref 38.7–49.9)
HEMOGLOBIN: 11.6 g/dL — AB (ref 13.0–17.1)
Lymphocytes Relative: 28 %
Lymphs Abs: 1.3 10*3/uL (ref 0.9–3.3)
MCH: 34.2 pg — ABNORMAL HIGH (ref 28.0–33.4)
MCHC: 33.9 g/dL (ref 32.0–35.9)
MCV: 100.9 fL — ABNORMAL HIGH (ref 82.0–98.0)
Monocytes Absolute: 0.4 10*3/uL (ref 0.1–0.9)
Monocytes Relative: 10 %
Neutro Abs: 2.4 10*3/uL (ref 1.5–6.5)
Neutrophils Relative %: 52 %
PLATELETS: 149 10*3/uL (ref 145–400)
RBC: 3.39 MIL/uL — AB (ref 4.20–5.70)
RDW: 12.8 % (ref 11.1–15.7)
WBC: 4.5 10*3/uL (ref 4.0–10.0)

## 2018-04-09 LAB — CMP (CANCER CENTER ONLY)
ALBUMIN: 3.7 g/dL (ref 3.5–5.0)
ALK PHOS: 80 U/L (ref 26–84)
ALT: 40 U/L (ref 10–47)
AST: 39 U/L — ABNORMAL HIGH (ref 11–38)
Anion gap: 9 (ref 5–15)
BUN: 21 mg/dL (ref 7–22)
CO2: 30 mmol/L (ref 18–33)
CREATININE: 1.4 mg/dL — AB (ref 0.60–1.20)
Calcium: 9.4 mg/dL (ref 8.0–10.3)
Chloride: 103 mmol/L (ref 98–108)
GLUCOSE: 142 mg/dL — AB (ref 73–118)
Potassium: 3.9 mmol/L (ref 3.3–4.7)
SODIUM: 142 mmol/L (ref 128–145)
TOTAL PROTEIN: 7.2 g/dL (ref 6.4–8.1)
Total Bilirubin: 1.1 mg/dL (ref 0.2–1.6)

## 2018-04-09 NOTE — Progress Notes (Signed)
Hematology and Oncology Follow Up Visit  [name] [id number] 17-Aug-1929 84 y.o. 04/09/2018   Principle Diagnosis:  1. 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 Anemia secondary to chronic renal insufficiency 3. Intermittent iron - deficiency anemia  Current Therapy:   IV iron as indicated - last received in December 2017   Interim History: [name] is here today with his daughter for follow-up. He is doing well and is excited for senior Olympics which starts in the next few weeks.  He has SOB with over exertion due to emphysema. This is unchanged.  His M-spike in December was 0.4, IgG was 1,062 mg/dL and lambda light chain was 11.22 mg/dL. Results for today are pending.  He has had no fever, chills, n/v, cough, rash, dizziness, chest pain, palpitations, abdominal pain or changes in bowel or bladder habits.  No swelling or tenderness in his extremities at this time. The numbness in his left hand from an old saw injury is unchanged.  He has a good appetite and is staying well hydrated. His weight is stable.   ECOG Performance Status: 1 - Symptomatic but completely ambulatory  Medications:  Allergies as of 04/09/2018   No Known Allergies     Medication List        Accurate as of 04/09/18  3:09 PM. Always use your most recent med list.          AEROCHAMBER MV inhaler Use as instructed   albuterol 108 (90 Base) MCG/ACT inhaler Commonly known as:  PROVENTIL HFA;VENTOLIN HFA Inhale 2 puffs into the lungs every 4 (four) hours as needed for wheezing or shortness of breath.   apixaban 5 MG Tabs tablet Commonly known as:  ELIQUIS Take 1 tablet (5 mg total) by mouth every 12 (twelve) hours.   atorvastatin 40 MG tablet Commonly known as:  LIPITOR Take 1 tablet (40 mg total) by mouth daily.   CITRUCEL 500 MG Tabs Generic drug:  Methylcellulose (Laxative) Take 1,000 mg by mouth at bedtime.   [facility] SENIOR PROBIOTIC PO Take 1 capsule by mouth  at bedtime. Once daily   digoxin 0.125 MG tablet Commonly known as:  LANOXIN TAKE 1 TABLET BY MOUTH  DAILY   diphenhydramine-acetaminophen 25-500 MG Tabs tablet Commonly known as:  TYLENOL PM Take 2 tablets by mouth at bedtime.   ENSURE Take 1 Can by mouth daily.   furosemide 40 MG tablet Commonly known as:  LASIX TAKE 1 AND 1/2 TABLETS BY  MOUTH DAILY   metoprolol tartrate 50 MG tablet Commonly known as:  LOPRESSOR TAKE 1 AND 1/2 TABLETS BY  MOUTH TWO TIMES DAILY   midodrine 2.5 MG tablet Commonly known as:  PROAMATINE Take 1 tablet (2.5 mg total) by mouth 2 (two) times daily with a meal. T   niacin 500 MG CR tablet Commonly known as:  NIASPAN TAKE 1 TABLET (500 MG TOTAL) BY MOUTH AT BEDTIME.   STIOLTO RESPIMAT 2.5-2.5 MCG/ACT Aers Generic drug:  Tiotropium Bromide-Olodaterol USE 2 PUFFS DAILY   tamsulosin 0.4 MG Caps capsule Commonly known as:  FLOMAX Take 0.4 mg by mouth at bedtime.       Allergies: No Known Allergies  Past Medical History, Surgical history, Social history, and Family History were reviewed and updated.  Review of Systems: All other 10 point review of systems is negative.   Physical Exam:  vitals were not taken for this visit.   Wt Readings from Last 3 Encounters:  12/11/17 177 lb (80.3 kg)  12/04/17 178 lb 3.2 oz (80.8 kg)  11/27/17 179 lb 2 oz (81.3 kg)    Ocular: Sclerae unicteric, pupils equal, round and reactive to light Ear-nose-throat: Oropharynx clear, dentition fair Lymphatic: No cervical, supraclavicular or axillary adenopathy Lungs no rales or rhonchi, good excursion bilaterally Heart regular rate and rhythm, no murmur appreciated Abd soft, nontender, positive bowel sounds, no liver or spleen tip palpated on exam, no fluid wave  MSK no focal spinal tenderness, no joint edema Neuro: non-focal, well-oriented, appropriate affect Breasts: Deferred   Lab Results  Component Value Date   WBC 4.5 04/09/2018   HGB 11.4 (L)  12/11/2017   HCT 34.2 (L) 04/09/2018   MCV 100.9 (H) 04/09/2018   PLT 149 04/09/2018   Lab Results  Component Value Date   FERRITIN 958 (H) 12/11/2017   IRON 75 12/11/2017   TIBC 251 12/11/2017   UIBC 176 12/11/2017   IRONPCTSAT 30 12/11/2017   Lab Results  Component Value Date   RETICCTPCT 0.8 11/09/2015   RBC 3.39 (L) 04/09/2018   RETICCTABS 33.2 11/09/2015   Lab Results  Component Value Date   KPAFRELGTCHN 4.45 (H) 11/09/2015   LAMBDASER 9.33 (H) 11/09/2015   KAPLAMBRATIO 0.32 12/11/2017   Lab Results  Component Value Date   IGGSERUM 1,062 12/11/2017   IGA 181 11/09/2015   IGMSERUM 325 (H) 12/11/2017   Lab Results  Component Value Date   TOTALPROTELP 7.1 11/09/2015   ALBUMINELP 3.8 11/09/2015   A1GS 0.3 11/09/2015   A2GS 0.8 11/09/2015   BETS 0.4 11/09/2015   BETA2SER 0.4 11/09/2015   GAMS 1.3 11/09/2015   MSPIKE 0.4 (H) 12/11/2017   SPEI * 11/09/2015     Chemistry      Component Value Date/Time   NA 142 04/09/2018 1432   NA 146 (H) 12/11/2017 1322   NA 141 06/05/2017 1051   K 3.9 04/09/2018 1432   K 4.5 12/11/2017 1322   K 3.9 06/05/2017 1051   CL 103 04/09/2018 1432   CL 102 12/11/2017 1322   CO2 30 04/09/2018 1432   CO2 31 12/11/2017 1322   CO2 25 06/05/2017 1051   BUN 21 04/09/2018 1432   BUN 21 12/11/2017 1322   BUN 15.6 06/05/2017 1051   CREATININE 1.40 (H) 04/09/2018 1432   CREATININE 1.4 (H) 12/11/2017 1322   CREATININE 1.3 06/05/2017 1051      Component Value Date/Time   CALCIUM 9.4 04/09/2018 1432   CALCIUM 9.6 12/11/2017 1322   CALCIUM 9.6 06/05/2017 1051   ALKPHOS 80 04/09/2018 1432   ALKPHOS 74 12/11/2017 1322   ALKPHOS 77 06/05/2017 1051   AST 39 (H) 04/09/2018 1432   AST 33 06/05/2017 1051   ALT 40 04/09/2018 1432   ALT 43 12/11/2017 1322   ALT 32 06/05/2017 1051   BILITOT 1.1 04/09/2018 1432   BILITOT 0.90 06/05/2017 1051      Impression and Plan: [name] is a very pleasant 84 yo caucasian gentleman with history  of anemia of iron deficiency and chronic renal insufficiency. His Hgb is stable at 11.6 with an MCV of 100.  So far his MGUS has not been a problem. Protein studies for today are pending.  We will see what his iron studies show and bring him back in for infusion if needed.  We will plan to see him back in another 4 months for follow-up.  They will contact our office with any questions or concerns. We can certainly see him sooner if need  be.   Laverna Peace, NP 4/11/20193:09 PM

## 2018-04-10 LAB — IRON AND TIBC
Iron: 75 ug/dL (ref 42–163)
SATURATION RATIOS: 29 % — AB (ref 42–163)
TIBC: 255 ug/dL (ref 202–409)
UIBC: 180 ug/dL

## 2018-04-10 LAB — IGG, IGA, IGM
IGA: 135 mg/dL (ref 61–437)
IgG (Immunoglobin G), Serum: 1125 mg/dL (ref 700–1600)
IgM (Immunoglobulin M), Srm: 343 mg/dL — ABNORMAL HIGH (ref 15–143)

## 2018-04-10 LAB — KAPPA/LAMBDA LIGHT CHAINS
KAPPA, LAMDA LIGHT CHAIN RATIO: 0.39 (ref 0.26–1.65)
Kappa free light chain: 41.3 mg/L — ABNORMAL HIGH (ref 3.3–19.4)
LAMDA FREE LIGHT CHAINS: 105.2 mg/L — AB (ref 5.7–26.3)

## 2018-04-10 LAB — FERRITIN: FERRITIN: 712 ng/mL — AB (ref 22–316)

## 2018-04-13 ENCOUNTER — Telehealth: Payer: Self-pay | Admitting: Emergency Medicine

## 2018-04-13 MED ORDER — ALBUTEROL SULFATE HFA 108 (90 BASE) MCG/ACT IN AERS: 2.0000 | INHALATION_SPRAY | RESPIRATORY_TRACT | 1 refills | Status: DC | PRN

## 2018-04-13 NOTE — Telephone Encounter (Signed)
New prescription has been sent in per our office protocol. Nothing further was needed.

## 2018-04-14 ENCOUNTER — Telehealth: Payer: Self-pay | Admitting: Emergency Medicine

## 2018-04-14 NOTE — Telephone Encounter (Signed)
Called [facility] and spoke with Otoe. She states that we can disregard this message. They have the Proair prescription on file that I send yesterday. Nothing further was needed at this time.

## 2018-04-15 LAB — PROTEIN ELECTROPHORESIS, SERUM, WITH REFLEX
A/G Ratio: 1.1 (ref 0.7–1.7)
ALBUMIN ELP: 3.6 g/dL (ref 2.9–4.4)
Alpha-1-Globulin: 0.2 g/dL (ref 0.0–0.4)
Alpha-2-Globulin: 0.9 g/dL (ref 0.4–1.0)
Beta Globulin: 1 g/dL (ref 0.7–1.3)
GAMMA GLOBULIN: 1.4 g/dL (ref 0.4–1.8)
Globulin, Total: 3.4 g/dL (ref 2.2–3.9)
M-Spike, %: 0.7 g/dL — ABNORMAL HIGH
SPEP INTERP: 0
TOTAL PROTEIN ELP: 7 g/dL (ref 6.0–8.5)

## 2018-04-15 LAB — IMMUNOFIXATION REFLEX, SERUM
IGA: 135 mg/dL (ref 61–437)
IGG (IMMUNOGLOBIN G), SERUM: 1121 mg/dL (ref 700–1600)
IgM (Immunoglobulin M), Srm: 323 mg/dL — ABNORMAL HIGH (ref 15–143)

## 2018-04-18 ENCOUNTER — Other Ambulatory Visit: Payer: Self-pay | Admitting: Emergency Medicine

## 2018-04-18 ENCOUNTER — Other Ambulatory Visit: Payer: Self-pay | Admitting: Cardiovascular Disease

## 2018-04-23 DIAGNOSIS — H6122 Impacted cerumen, left ear: Secondary | ICD-10-CM | POA: Diagnosis not present

## 2018-04-23 DIAGNOSIS — H903 Sensorineural hearing loss, bilateral: Secondary | ICD-10-CM | POA: Diagnosis not present

## 2018-04-23 DIAGNOSIS — H6042 Cholesteatoma of left external ear: Secondary | ICD-10-CM | POA: Diagnosis not present

## 2018-04-30 ENCOUNTER — Ambulatory Visit (INDEPENDENT_AMBULATORY_CARE_PROVIDER_SITE_OTHER): Payer: Medicare Other | Admitting: Cardiovascular Disease

## 2018-04-30 ENCOUNTER — Encounter: Payer: Self-pay | Admitting: Cardiovascular Disease

## 2018-04-30 VITALS — BP 118/62 | HR 73 | Ht 68.0 in | Wt 179.8 lb

## 2018-04-30 DIAGNOSIS — Z7901 Long term (current) use of anticoagulants: Secondary | ICD-10-CM

## 2018-04-30 DIAGNOSIS — I484 Atypical atrial flutter: Secondary | ICD-10-CM

## 2018-04-30 DIAGNOSIS — R600 Localized edema: Secondary | ICD-10-CM | POA: Diagnosis not present

## 2018-04-30 DIAGNOSIS — I2510 Atherosclerotic heart disease of native coronary artery without angina pectoris: Secondary | ICD-10-CM | POA: Diagnosis not present

## 2018-04-30 DIAGNOSIS — J439 Emphysema, unspecified: Secondary | ICD-10-CM | POA: Diagnosis not present

## 2018-04-30 NOTE — Patient Instructions (Signed)
Medication Instructions:  Your physician recommends that you continue on your current medications as directed. Please refer to the Current Medication list given to you today.  Follow-Up: Your physician wants you to follow-up in: 6 months with Dr. Kelly.  You will receive a reminder letter in the mail two months in advance. If you don't receive a letter, please call our office to schedule the follow-up appointment.      If you need a refill on your cardiac medications before your next appointment, please call your pharmacy.   
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
## 2018-05-01 ENCOUNTER — Encounter: Payer: Self-pay | Admitting: Cardiovascular Disease

## 2018-05-05 DIAGNOSIS — H353122 Nonexudative age-related macular degeneration, left eye, intermediate dry stage: Secondary | ICD-10-CM | POA: Diagnosis not present

## 2018-05-05 DIAGNOSIS — H43821 Vitreomacular adhesion, right eye: Secondary | ICD-10-CM | POA: Diagnosis not present

## 2018-05-05 DIAGNOSIS — H353212 Exudative age-related macular degeneration, right eye, with inactive choroidal neovascularization: Secondary | ICD-10-CM | POA: Diagnosis not present

## 2018-05-05 DIAGNOSIS — H353112 Nonexudative age-related macular degeneration, right eye, intermediate dry stage: Secondary | ICD-10-CM | POA: Diagnosis not present

## 2018-05-12 DIAGNOSIS — H6122 Impacted cerumen, left ear: Secondary | ICD-10-CM | POA: Diagnosis not present

## 2018-05-12 DIAGNOSIS — H6042 Cholesteatoma of left external ear: Secondary | ICD-10-CM | POA: Diagnosis not present

## 2018-05-12 DIAGNOSIS — H903 Sensorineural hearing loss, bilateral: Secondary | ICD-10-CM | POA: Diagnosis not present

## 2018-05-12 DIAGNOSIS — H7112 Cholesteatoma of tympanum, left ear: Secondary | ICD-10-CM | POA: Diagnosis not present

## 2018-06-04 DIAGNOSIS — H903 Sensorineural hearing loss, bilateral: Secondary | ICD-10-CM | POA: Diagnosis not present

## 2018-06-04 DIAGNOSIS — H7112 Cholesteatoma of tympanum, left ear: Secondary | ICD-10-CM | POA: Diagnosis not present

## 2018-06-04 DIAGNOSIS — H6042 Cholesteatoma of left external ear: Secondary | ICD-10-CM | POA: Diagnosis not present

## 2018-06-04 DIAGNOSIS — H6122 Impacted cerumen, left ear: Secondary | ICD-10-CM | POA: Diagnosis not present

## 2018-06-23 DIAGNOSIS — H6122 Impacted cerumen, left ear: Secondary | ICD-10-CM | POA: Diagnosis not present

## 2018-06-23 DIAGNOSIS — H6042 Cholesteatoma of left external ear: Secondary | ICD-10-CM | POA: Diagnosis not present

## 2018-06-23 DIAGNOSIS — H60392 Other infective otitis externa, left ear: Secondary | ICD-10-CM | POA: Diagnosis not present

## 2018-06-23 DIAGNOSIS — H903 Sensorineural hearing loss, bilateral: Secondary | ICD-10-CM | POA: Diagnosis not present

## 2018-06-23 DIAGNOSIS — H7112 Cholesteatoma of tympanum, left ear: Secondary | ICD-10-CM | POA: Diagnosis not present

## 2018-06-26 ENCOUNTER — Encounter: Payer: Self-pay | Admitting: Emergency Medicine

## 2018-06-26 ENCOUNTER — Ambulatory Visit (INDEPENDENT_AMBULATORY_CARE_PROVIDER_SITE_OTHER): Payer: Medicare Other | Admitting: Emergency Medicine

## 2018-06-26 DIAGNOSIS — J439 Emphysema, unspecified: Secondary | ICD-10-CM

## 2018-06-26 DIAGNOSIS — I2510 Atherosclerotic heart disease of native coronary artery without angina pectoris: Secondary | ICD-10-CM | POA: Diagnosis not present

## 2018-06-26 MED ORDER — REVEFENACIN 175 MCG/3ML IN SOLN: 1.0000 | Freq: Every day | RESPIRATORY_TRACT | 3 refills | Status: DC

## 2018-06-26 MED ORDER — REVEFENACIN 175 MCG/3ML IN SOLN: 1.0000 | Freq: Every day | RESPIRATORY_TRACT | 0 refills | Status: DC

## 2018-06-26 NOTE — Progress Notes (Signed)
  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  MGUS followed by oncology, allergic rhinitis with some associated cough.  Chronic hypoxemic respiratory failure.  He was hospitalized in June 2018 for an apparent CHF exacerbation, possibly also some degree of bronchospasm and was treated for both. He is staying active, but is being limited by knee pain. He has been doing well since. Current BD is Stiolto, uses albuterol infrequently.  Daughter is concerned that he is not using his albuterol frequently enough and that he is not exhibiting good technique.  ROV 06/26/18 --this is a follow-up visit for 84 year old gentleman with COPD, CAD/CABG, MGUS, allergic rhinitis with some associated cough.  He has chronic hypoxemic respiratory failure.  We have been managing him with Stiolto but he admits today that he is concerned that he may not be getting adequate delivery. He has had some increase in his dyspnea.                                                              No flowsheet data found.  EXAM :  Vitals:   06/26/18 1448  BP: (!) 102/54  Pulse: 71  SpO2: 94%  Weight: 184 lb (83.5 kg)  Height: 5\' 5"  (1.651 m)   Gen: Pleasant,  in no distress,  normal affect  ENT: No lesions,  mouth clear,  oropharynx clear, no postnasal drip  Neck: No JVD, no stridor  Lungs: No use of accessory muscles, distant, no wheeze or crackles, diminshed BS in bases   Cardiovascular: RRR, heart sounds normal, no murmur or gallops, no peripheral edema  Musculoskeletal: No deformities, no cyanosis or clubbing  Neuro: alert, non focal  Skin: Warm, no lesions or rashes   COPD (chronic obstructive pulmonary disease) Unclear that he is getting his Stiolto effectively.  We will try changing him to Yupelri nebulized once a day.  See if he gets benefit.  If he feels like he is getting better delivery but still is not back to his usual baseline we could consider adding on the nebulized LABA in the  future.  We will stop Stiolto for now.  We will start Yupelri one nebulizer treatment once a day.  We may decide to add on another nebulized med in the future depending on your response.  Keep your albuterol inhaler available to use 2 puffs if needed for shortness of breath, chest tightness, wheeze.  Follow with Dr Lamonte Sakai in 2 months or sooner if you have any problems.   Baltazar Apo, MD, PhD 06/26/2018, 3:20 PM [facility] [phone] or if no answer [phone]

## 2018-06-26 NOTE — Patient Instructions (Addendum)
We will stop Stiolto for now.  We will start Yupelri one nebulizer treatment once a day.  We may decide to add on another nebulized med in the future depending on your response.  Keep your albuterol inhaler available to use 2 puffs if needed for shortness of breath, chest tightness, wheeze.  Follow with Dr Lamonte Sakai in 2 months or sooner if you have any problems.

## 2018-06-26 NOTE — Assessment & Plan Note (Signed)
Unclear that he is getting his Stiolto effectively.  We will try changing him to Yupelri nebulized once a day.  See if he gets benefit.  If he feels like he is getting better delivery but still is not back to his usual baseline we could consider adding on the nebulized LABA in the future.  We will stop Stiolto for now.  We will start Yupelri one nebulizer treatment once a day.  We may decide to add on another nebulized med in the future depending on your response.  Keep your albuterol inhaler available to use 2 puffs if needed for shortness of breath, chest tightness, wheeze.  Follow with Dr Lamonte Sakai in 2 months or sooner if you have any problems.

## 2018-07-03 ENCOUNTER — Telehealth: Payer: Self-pay | Admitting: Emergency Medicine

## 2018-07-03 NOTE — Telephone Encounter (Signed)
Spoke with pt's daughter. She states that [employer] is needing a DWO form signed by RB. She is also requesting more samples of Yupleri.

## 2018-07-03 NOTE — Telephone Encounter (Signed)
Called pt's daughter [name] but unable to reach her.  Order was placed 6/28 for pt to receive the neb sol Yupelri and the Dx code was also placed.  Attempted to call [facility] but unable to speak to someone from DME.  Left a detailed message for [facility] explaining the reason for my call and stated in the message for them to return my call.

## 2018-07-06 MED ORDER — REVEFENACIN 175 MCG/3ML IN SOLN: 1.0000 | Freq: Every day | RESPIRATORY_TRACT | 0 refills | Status: DC

## 2018-07-06 NOTE — Telephone Encounter (Signed)
Unsure per documentation what was done regarding this message.. Samples left up front for pt. Pt's daughter [name] aware. Called [facility], had to LM to have DWO refaxed to office.

## 2018-07-07 DIAGNOSIS — H6122 Impacted cerumen, left ear: Secondary | ICD-10-CM | POA: Diagnosis not present

## 2018-07-07 DIAGNOSIS — H7112 Cholesteatoma of tympanum, left ear: Secondary | ICD-10-CM | POA: Diagnosis not present

## 2018-07-07 DIAGNOSIS — H903 Sensorineural hearing loss, bilateral: Secondary | ICD-10-CM | POA: Diagnosis not present

## 2018-07-07 DIAGNOSIS — H6042 Cholesteatoma of left external ear: Secondary | ICD-10-CM | POA: Diagnosis not present

## 2018-07-07 DIAGNOSIS — H60392 Other infective otitis externa, left ear: Secondary | ICD-10-CM | POA: Diagnosis not present

## 2018-07-09 NOTE — Telephone Encounter (Signed)
Spoke with Melissa at [facility]. She stated that the form needed to come from [facility]'s pharmacy. She will have them to fax over the [facility]. It will be faxed to the main fax up front. Will leave this encounter open until the form has been received.

## 2018-07-10 NOTE — Telephone Encounter (Signed)
Will await fax.

## 2018-07-10 NOTE — Telephone Encounter (Signed)
[name], [name], calling to back to check the status of this form. [name] is faxing form again. Cb is [phone]

## 2018-07-10 NOTE — Telephone Encounter (Signed)
I checked up front and RB's look at. This form has not been received. Will contact [facility] to have them refax this.

## 2018-07-13 NOTE — Telephone Encounter (Signed)
Checked with Rodena Piety, [facility] to see if the [place] form has been received that needs to be signed by RB and per Rodena Piety, she has received the [place] form.  Called [facility] and spoke with Lonn Georgia letting her know the form has been received and will have Dr. Lamonte Sakai sign it once he returns back to the office. Lonn Georgia stated she would let Melissa know so that she is aware.

## 2018-07-23 DIAGNOSIS — H60392 Other infective otitis externa, left ear: Secondary | ICD-10-CM | POA: Diagnosis not present

## 2018-07-23 DIAGNOSIS — H903 Sensorineural hearing loss, bilateral: Secondary | ICD-10-CM | POA: Diagnosis not present

## 2018-07-23 DIAGNOSIS — H6122 Impacted cerumen, left ear: Secondary | ICD-10-CM | POA: Diagnosis not present

## 2018-07-23 DIAGNOSIS — H6042 Cholesteatoma of left external ear: Secondary | ICD-10-CM | POA: Diagnosis not present

## 2018-07-23 DIAGNOSIS — H7112 Cholesteatoma of tympanum, left ear: Secondary | ICD-10-CM | POA: Diagnosis not present

## 2018-07-28 ENCOUNTER — Other Ambulatory Visit: Payer: Self-pay | Admitting: Cardiovascular Disease

## 2018-07-28 MED ORDER — METOPROLOL TARTRATE 50 MG PO TABS: 75.0000 mg | ORAL_TABLET | Freq: Two times a day (BID) | ORAL | 3 refills | Status: DC

## 2018-07-28 MED ORDER — MIDODRINE HCL 2.5 MG PO TABS: 2.5000 mg | ORAL_TABLET | Freq: Two times a day (BID) | ORAL | 3 refills | Status: DC

## 2018-07-28 MED ORDER — FUROSEMIDE 40 MG PO TABS: 60.0000 mg | ORAL_TABLET | Freq: Every day | ORAL | 3 refills | Status: DC

## 2018-07-28 MED ORDER — ATORVASTATIN CALCIUM 40 MG PO TABS: 40.0000 mg | ORAL_TABLET | Freq: Every day | ORAL | 3 refills | Status: DC

## 2018-07-28 MED ORDER — NIACIN ER (ANTIHYPERLIPIDEMIC) 500 MG PO TBCR: EXTENDED_RELEASE_TABLET | ORAL | 3 refills | Status: DC

## 2018-07-28 MED ORDER — DIGOXIN 125 MCG PO TABS: 125.0000 ug | ORAL_TABLET | Freq: Every day | ORAL | 3 refills | Status: DC

## 2018-07-28 NOTE — Telephone Encounter (Signed)
New message   Pt c/o medication issue:  1. Name of Medication: furosemide (LASIX) 40 MG tablet  2. How are you currently taking this medication (dosage and times per day)?   3. Are you having a reaction (difficulty breathing--STAT)? no  4. What is your medication issue? Daughter states she has adjusted the dosage, requesting nurse call

## 2018-07-28 NOTE — Telephone Encounter (Signed)
Spoke to pt daughter (JoAnn)--OK per DPR. She stated that she needed all cardiac Rx's [facility] [place] except Eliquis, which they will continue to get through [facility].   Sent all cardiac meds except eliquis to requested pharmacy. Pt verbalized thanks.

## 2018-08-06 DIAGNOSIS — H6122 Impacted cerumen, left ear: Secondary | ICD-10-CM | POA: Diagnosis not present

## 2018-08-06 DIAGNOSIS — H903 Sensorineural hearing loss, bilateral: Secondary | ICD-10-CM | POA: Diagnosis not present

## 2018-08-06 DIAGNOSIS — H7112 Cholesteatoma of tympanum, left ear: Secondary | ICD-10-CM | POA: Diagnosis not present

## 2018-08-06 DIAGNOSIS — H60392 Other infective otitis externa, left ear: Secondary | ICD-10-CM | POA: Diagnosis not present

## 2018-08-06 DIAGNOSIS — H6042 Cholesteatoma of left external ear: Secondary | ICD-10-CM | POA: Diagnosis not present

## 2018-08-13 ENCOUNTER — Inpatient Hospital Stay: Payer: Medicare Other | Attending: Hematology & Oncology | Admitting: Hematology & Oncology

## 2018-08-13 ENCOUNTER — Other Ambulatory Visit: Payer: Self-pay

## 2018-08-13 ENCOUNTER — Inpatient Hospital Stay: Payer: Medicare Other

## 2018-08-13 ENCOUNTER — Encounter: Payer: Self-pay | Admitting: Hematology & Oncology

## 2018-08-13 VITALS — BP 135/64 | HR 87 | Temp 98.0°F | Resp 18 | Wt 182.0 lb

## 2018-08-13 DIAGNOSIS — D472 Monoclonal gammopathy: Secondary | ICD-10-CM

## 2018-08-13 DIAGNOSIS — D631 Anemia in chronic kidney disease: Secondary | ICD-10-CM | POA: Insufficient documentation

## 2018-08-13 DIAGNOSIS — D509 Iron deficiency anemia, unspecified: Secondary | ICD-10-CM | POA: Diagnosis not present

## 2018-08-13 DIAGNOSIS — N189 Chronic kidney disease, unspecified: Secondary | ICD-10-CM | POA: Insufficient documentation

## 2018-08-13 DIAGNOSIS — D500 Iron deficiency anemia secondary to blood loss (chronic): Secondary | ICD-10-CM

## 2018-08-13 DIAGNOSIS — Z79899 Other long term (current) drug therapy: Secondary | ICD-10-CM | POA: Diagnosis not present

## 2018-08-13 LAB — CMP (CANCER CENTER ONLY)
ALBUMIN: 3.7 g/dL (ref 3.5–5.0)
ALK PHOS: 81 U/L (ref 26–84)
ALT: 47 U/L (ref 10–47)
ANION GAP: 9 (ref 5–15)
AST: 44 U/L — ABNORMAL HIGH (ref 11–38)
BILIRUBIN TOTAL: 1.3 mg/dL (ref 0.2–1.6)
BUN: 20 mg/dL (ref 7–22)
CO2: 28 mmol/L (ref 18–33)
CREATININE: 1.6 mg/dL — AB (ref 0.60–1.20)
Calcium: 9.5 mg/dL (ref 8.0–10.3)
Chloride: 102 mmol/L (ref 98–108)
Glucose, Bld: 132 mg/dL — ABNORMAL HIGH (ref 73–118)
POTASSIUM: 3.8 mmol/L (ref 3.3–4.7)
Sodium: 139 mmol/L (ref 128–145)
Total Protein: 7.3 g/dL (ref 6.4–8.1)

## 2018-08-13 LAB — CBC WITH DIFFERENTIAL (CANCER CENTER ONLY)
Basophils Absolute: 0.1 10*3/uL (ref 0.0–0.1)
Basophils Relative: 2 %
Eosinophils Absolute: 0.4 10*3/uL (ref 0.0–0.5)
Eosinophils Relative: 8 %
HEMATOCRIT: 34.7 % — AB (ref 38.7–49.9)
HEMOGLOBIN: 11.4 g/dL — AB (ref 13.0–17.1)
LYMPHS ABS: 1.4 10*3/uL (ref 0.9–3.3)
Lymphocytes Relative: 24 %
MCH: 33.6 pg — AB (ref 28.0–33.4)
MCHC: 32.9 g/dL (ref 32.0–35.9)
MCV: 102.4 fL — ABNORMAL HIGH (ref 82.0–98.0)
MONO ABS: 0.5 10*3/uL (ref 0.1–0.9)
MONOS PCT: 10 %
NEUTROS ABS: 3.2 10*3/uL (ref 1.5–6.5)
Neutrophils Relative %: 56 %
Platelet Count: 150 10*3/uL (ref 145–400)
RBC: 3.39 MIL/uL — ABNORMAL LOW (ref 4.20–5.70)
RDW: 13.3 % (ref 11.1–15.7)
WBC Count: 5.6 10*3/uL (ref 4.0–10.0)

## 2018-08-13 NOTE — Progress Notes (Signed)
Hematology and Oncology Follow Up Visit  DORR PERROT [id number] 12/04/1929 84 y.o. 08/13/2018   Principle Diagnosis:  1. 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 Anemia secondary to chronic renal insufficiency 3. Intermittent iron - deficiency anemia  Current Therapy:   IV iron as indicated - last received in December 2017   Interim History: Mr. Spackman is here today with his daughter for follow-up.   He was last seen back in April.  Since then, he has been doing quite well.  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  He was the only contestant in his age group.  His iron studies back in April showed a ferritin of 712 with an iron saturation of 29%.  His M spike was 0.7.  His IgG level was 1129m/dL.  His lambda light chain was 10.5 mg/dL.  All this is holding stable.  His birthday is coming up in a couple weeks.  He will be 84years old.  As always, he is very witty and talks a lot about the [place].  He actually was in an [employer] unit [place]  [facility]  Currently, his performance status is ECOG 1-2.    Medications:  Allergies as of 08/13/2018   No Known Allergies     Medication List        Accurate as of 08/13/18  4:47 PM. Always use your most recent med list.          apixaban 5 MG Tabs tablet Commonly known as:  ELIQUIS Take 1 tablet (5 mg total) by mouth every 12 (twelve) hours.   atorvastatin 40 MG tablet Commonly known as:  LIPITOR Take 1 tablet (40 mg total) by mouth daily.   CITRUCEL 500 MG Tabs Generic drug:  Methylcellulose (Laxative) Take 1,000 mg by mouth at bedtime.   [facility] SENIOR PROBIOTIC PO Take 1 capsule by mouth at bedtime. Once daily   digoxin 0.125 MG tablet Commonly known as:  LANOXIN Take 1 tablet (125 mcg total) by mouth daily.   diphenhydramine-acetaminophen 25-500 MG Tabs tablet Commonly known as:  TYLENOL PM Take 2 tablets by mouth at bedtime.    ENSURE Take 1 Can by mouth daily.   furosemide 40 MG tablet Commonly known as:  LASIX Take 1.5 tablets (60 mg total) by mouth daily.   metoprolol tartrate 50 MG tablet Commonly known as:  LOPRESSOR Take 1.5 tablets (75 mg total) by mouth 2 (two) times daily.   midodrine 2.5 MG tablet Commonly known as:  PROAMATINE Take 1 tablet (2.5 mg total) by mouth 2 (two) times daily with a meal. T   niacin 500 MG CR tablet Commonly known as:  NIASPAN TAKE 1 TABLET BY MOUTH AT  BEDTIME. Must keep upcoming appointment for continuation of refills   PROAIR HFA 108 (90 Base) MCG/ACT inhaler Generic drug:  albuterol INHALE 2 PUFFS INTO THE  LUNGS EVERY 4 HOURS AS  NEEDED FOR WHEEZING OR  SHORTNESS OF BREATH   Revefenacin 175 MCG/3ML Soln Inhale 1 vial into the lungs daily.   tamsulosin 0.4 MG Caps capsule Commonly known as:  FLOMAX Take 0.4 mg by mouth at bedtime.       Allergies: No Known Allergies  Past Medical History, Surgical history, Social history, and Family History were reviewed and updated.  Review of Systems: All other 10 point review of systems is negative.   Physical Exam:  weight is 182 lb (82.6 kg).  His oral temperature is 98 F (36.7 C). His blood pressure is 135/64 and his pulse is 87. His respiration is 18 and oxygen saturation is 96%.   Wt Readings from Last 3 Encounters:  08/13/18 182 lb (82.6 kg)  06/26/18 184 lb (83.5 kg)  04/30/18 179 lb 12.8 oz (81.6 kg)    Ocular: Sclerae unicteric, pupils equal, round and reactive to light Ear-nose-throat: Oropharynx clear, dentition fair Lymphatic: No cervical, supraclavicular or axillary adenopathy Lungs no rales or rhonchi, good excursion bilaterally Heart regular rate and rhythm, no murmur appreciated Abd soft, nontender, positive bowel sounds, no liver or spleen tip palpated on exam, no fluid wave  MSK no focal spinal tenderness, no joint edema Neuro: non-focal, well-oriented, appropriate affect Breasts:  Deferred   Lab Results  Component Value Date   WBC 5.6 08/13/2018   HGB 11.4 (L) 08/13/2018   HCT 34.7 (L) 08/13/2018   MCV 102.4 (H) 08/13/2018   PLT 150 08/13/2018   Lab Results  Component Value Date   FERRITIN 712 (H) 04/09/2018   IRON 75 04/09/2018   TIBC 255 04/09/2018   UIBC 180 04/09/2018   IRONPCTSAT 29 (L) 04/09/2018   Lab Results  Component Value Date   RETICCTPCT 0.8 11/09/2015   RBC 3.39 (L) 08/13/2018   RETICCTABS 33.2 11/09/2015   Lab Results  Component Value Date   KPAFRELGTCHN 41.3 (H) 04/09/2018   LAMBDASER 105.2 (H) 04/09/2018   KAPLAMBRATIO 0.39 04/09/2018   Lab Results  Component Value Date   IGGSERUM 1,125 04/09/2018   IGGSERUM 1,121 04/09/2018   IGA 135 04/09/2018   IGA 135 04/09/2018   IGMSERUM 343 (H) 04/09/2018   IGMSERUM 323 (H) 04/09/2018   Lab Results  Component Value Date   TOTALPROTELP 7.0 04/09/2018   ALBUMINELP 3.6 04/09/2018   A1GS 0.2 04/09/2018   A2GS 0.9 04/09/2018   BETS 1.0 04/09/2018   BETA2SER 0.4 11/09/2015   GAMS 1.4 04/09/2018   MSPIKE 0.7 (H) 04/09/2018   SPEI * 11/09/2015     Chemistry      Component Value Date/Time   NA 139 08/13/2018 1430   NA 146 (H) 12/11/2017 1322   NA 141 06/05/2017 1051   K 3.8 08/13/2018 1430   K 4.5 12/11/2017 1322   K 3.9 06/05/2017 1051   CL 102 08/13/2018 1430   CL 102 12/11/2017 1322   CO2 28 08/13/2018 1430   CO2 31 12/11/2017 1322   CO2 25 06/05/2017 1051   BUN 20 08/13/2018 1430   BUN 21 12/11/2017 1322   BUN 15.6 06/05/2017 1051   CREATININE 1.60 (H) 08/13/2018 1430   CREATININE 1.4 (H) 12/11/2017 1322   CREATININE 1.3 06/05/2017 1051      Component Value Date/Time   CALCIUM 9.5 08/13/2018 1430   CALCIUM 9.6 12/11/2017 1322   CALCIUM 9.6 06/05/2017 1051   ALKPHOS 81 08/13/2018 1430   ALKPHOS 74 12/11/2017 1322   ALKPHOS 77 06/05/2017 1051   AST 44 (H) 08/13/2018 1430   AST 33 06/05/2017 1051   ALT 47 08/13/2018 1430   ALT 43 12/11/2017 1322   ALT 32  06/05/2017 1051   BILITOT 1.3 08/13/2018 1430   BILITOT 0.90 06/05/2017 1051      Impression and Plan: Ms. Portilla is a very pleasant 84 yo caucasian gentleman with history of anemia of iron deficiency and chronic renal insufficiency.   It is amazing that he is done so well.  I would be surprised if his iron is on  the low side.  We will have him come back in 4 more months.  This will be right around the holiday season.  The MGUS I still think will never be a problem from my perspective.    [name], MD 8/15/20194:47 PM

## 2018-08-14 ENCOUNTER — Other Ambulatory Visit: Payer: Self-pay | Admitting: Hematology & Oncology

## 2018-08-14 LAB — PROTEIN ELECTROPHORESIS, SERUM
A/G RATIO SPE: 1.2 (ref 0.7–1.7)
ALPHA-1-GLOBULIN: 0.2 g/dL (ref 0.0–0.4)
Albumin ELP: 3.7 g/dL (ref 2.9–4.4)
Alpha-2-Globulin: 0.9 g/dL (ref 0.4–1.0)
Beta Globulin: 0.9 g/dL (ref 0.7–1.3)
GLOBULIN, TOTAL: 3.1 g/dL (ref 2.2–3.9)
Gamma Globulin: 1.2 g/dL (ref 0.4–1.8)
M-SPIKE, %: 0.6 g/dL — AB
TOTAL PROTEIN ELP: 6.8 g/dL (ref 6.0–8.5)

## 2018-08-14 LAB — IGG, IGA, IGM
IGA: 125 mg/dL (ref 61–437)
IgG (Immunoglobin G), Serum: 1058 mg/dL (ref 700–1600)
IgM (Immunoglobulin M), Srm: 348 mg/dL — ABNORMAL HIGH (ref 15–143)

## 2018-08-14 LAB — IRON AND TIBC
Iron: 74 ug/dL (ref 42–163)
SATURATION RATIOS: 27 % — AB (ref 42–163)
TIBC: 271 ug/dL (ref 202–409)
UIBC: 197 ug/dL

## 2018-08-14 LAB — KAPPA/LAMBDA LIGHT CHAINS
KAPPA FREE LGHT CHN: 36 mg/L — AB (ref 3.3–19.4)
Kappa, lambda light chain ratio: 0.3 (ref 0.26–1.65)
LAMDA FREE LIGHT CHAINS: 121.6 mg/L — AB (ref 5.7–26.3)

## 2018-08-14 LAB — FERRITIN: Ferritin: 788 ng/mL — ABNORMAL HIGH (ref 24–336)

## 2018-08-17 ENCOUNTER — Telehealth: Payer: Self-pay | Admitting: *Deleted

## 2018-08-17 NOTE — Telephone Encounter (Signed)
Patient's daughter [name] notified per order of Dr. Marin Olp that iron really is not too bad and that the abnormal protein is less.  Patient's daughter appreciative of call and has no questions at this time.

## 2018-08-17 NOTE — Telephone Encounter (Signed)
-----   Message from [name], MD sent at 08/14/2018  4:40 PM EDT ----- Call his dgtr -- the iron really is not too bad!!  The abnormal protein is less!!!  Laurey Arrow

## 2018-08-19 ENCOUNTER — Telehealth: Payer: Self-pay | Admitting: *Deleted

## 2018-08-19 NOTE — Telephone Encounter (Addendum)
Patient's daughter is aware of results  ----- Message from Eliezer Bottom, NP sent at 08/19/2018 10:21 AM EDT -----   ----- Message ----- From: Buel Ream, Lab [facility] [place] Sent: 08/13/2018   2:43 PM EDT To: Eliezer Bottom, NP

## 2018-08-20 DIAGNOSIS — H903 Sensorineural hearing loss, bilateral: Secondary | ICD-10-CM | POA: Diagnosis not present

## 2018-08-20 DIAGNOSIS — H6122 Impacted cerumen, left ear: Secondary | ICD-10-CM | POA: Diagnosis not present

## 2018-08-20 DIAGNOSIS — H7112 Cholesteatoma of tympanum, left ear: Secondary | ICD-10-CM | POA: Diagnosis not present

## 2018-08-20 DIAGNOSIS — H6042 Cholesteatoma of left external ear: Secondary | ICD-10-CM | POA: Diagnosis not present

## 2018-08-20 DIAGNOSIS — H60392 Other infective otitis externa, left ear: Secondary | ICD-10-CM | POA: Diagnosis not present

## 2018-08-21 ENCOUNTER — Other Ambulatory Visit: Payer: Self-pay | Admitting: Cardiovascular Disease

## 2018-08-21 DIAGNOSIS — I484 Atypical atrial flutter: Secondary | ICD-10-CM

## 2018-08-25 ENCOUNTER — Ambulatory Visit (INDEPENDENT_AMBULATORY_CARE_PROVIDER_SITE_OTHER): Payer: Medicare Other | Admitting: Emergency Medicine

## 2018-08-25 ENCOUNTER — Encounter: Payer: Self-pay | Admitting: Emergency Medicine

## 2018-08-25 DIAGNOSIS — I2510 Atherosclerotic heart disease of native coronary artery without angina pectoris: Secondary | ICD-10-CM | POA: Diagnosis not present

## 2018-08-25 DIAGNOSIS — J439 Emphysema, unspecified: Secondary | ICD-10-CM | POA: Diagnosis not present

## 2018-08-25 NOTE — Progress Notes (Signed)
  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  MGUS followed by oncology, allergic rhinitis with some associated cough.  Chronic hypoxemic respiratory failure.  He was hospitalized in June 2018 for an apparent CHF exacerbation, possibly also some degree of bronchospasm and was treated for both. He is staying active, but is being limited by knee pain. He has been doing well since. Current BD is Stiolto, uses albuterol infrequently.  Daughter is concerned that he is not using his albuterol frequently enough and that he is not exhibiting good technique.  ROV 06/26/18 --this is a follow-up visit for 84 year old gentleman with COPD, CAD/CABG, MGUS, allergic rhinitis with some associated cough.  He has chronic hypoxemic respiratory failure.  We have been managing him with Stiolto but he admits today that he is concerned that he may not be getting adequate delivery. He has had some increase in his dyspnea.   ROV 08/25/18 --84 year old man with COPD, CAD/CABG (2006, Dr. Claiborne Billings), MGUS, allergic rhinitis with some associated cough, chronic hypoxemic respiratory failure.  At his last visit I was concerned that he may not be getting his Stiolto reliably.  We tried changing him over nebulized therapy, started Yupelri.  He returns today, is unclear whether it has helped him. His daughter, however, tells me that it has helped his breathing. Better exertional tolerance. Less proAir use. No exacerbations.   Pneumonia vaccines UTD                                                             No flowsheet data found.  EXAM :  Vitals:   08/25/18 1455  BP: 122/68  Pulse: 70  SpO2: 97%  Weight: 183 lb 6.4 oz (83.2 kg)   Gen: Pleasant,  in no distress,  normal affect  ENT: No lesions,  mouth clear,  oropharynx clear, no postnasal drip  Neck: No JVD, no stridor  Lungs: No use of accessory muscles, distant, no wheeze or crackles, diminshed BS in bases   Cardiovascular: RRR, heart sounds normal, no  murmur or gallops, no peripheral edema  Musculoskeletal: No deformities, no cyanosis or clubbing  Neuro: alert, non focal  Skin: Warm, no lesions or rashes   COPD (chronic obstructive pulmonary disease) We will continue Yupelri once a day.  Keep ProAir available to use 2 puffs up to every 4 hours if needed for shortness of breath or wheezing.  You need the high dose flu shot this fall Your pneumonia shots are up to date Follow with Dr Lamonte Sakai in 6 months or sooner if you have any problems   Baltazar Apo, MD, PhD 08/25/2018, 3:12 PM [facility] [phone] or if no answer [phone]

## 2018-08-25 NOTE — Patient Instructions (Addendum)
We will continue Yupelri once a day.  Keep ProAir available to use 2 puffs up to every 4 hours if needed for shortness of breath or wheezing.  You need the high dose flu shot this fall Your pneumonia shots are up to date Follow with Dr Lamonte Sakai in 6 months or sooner if you have any problems

## 2018-08-25 NOTE — Assessment & Plan Note (Signed)
We will continue Yupelri once a day.  Keep ProAir available to use 2 puffs up to every 4 hours if needed for shortness of breath or wheezing.  You need the high dose flu shot this fall Your pneumonia shots are up to date Follow with Dr Lamonte Sakai in 6 months or sooner if you have any problems

## 2018-09-03 DIAGNOSIS — H6042 Cholesteatoma of left external ear: Secondary | ICD-10-CM | POA: Diagnosis not present

## 2018-09-03 DIAGNOSIS — H60392 Other infective otitis externa, left ear: Secondary | ICD-10-CM | POA: Diagnosis not present

## 2018-09-03 DIAGNOSIS — H7112 Cholesteatoma of tympanum, left ear: Secondary | ICD-10-CM | POA: Diagnosis not present

## 2018-09-03 DIAGNOSIS — H6122 Impacted cerumen, left ear: Secondary | ICD-10-CM | POA: Diagnosis not present

## 2018-09-03 DIAGNOSIS — H903 Sensorineural hearing loss, bilateral: Secondary | ICD-10-CM | POA: Diagnosis not present

## 2018-09-08 DIAGNOSIS — Z961 Presence of intraocular lens: Secondary | ICD-10-CM | POA: Diagnosis not present

## 2018-09-08 DIAGNOSIS — H353121 Nonexudative age-related macular degeneration, left eye, early dry stage: Secondary | ICD-10-CM | POA: Diagnosis not present

## 2018-09-08 DIAGNOSIS — D23111 Other benign neoplasm of skin of right upper eyelid, including canthus: Secondary | ICD-10-CM | POA: Diagnosis not present

## 2018-09-08 DIAGNOSIS — H353211 Exudative age-related macular degeneration, right eye, with active choroidal neovascularization: Secondary | ICD-10-CM | POA: Diagnosis not present

## 2018-09-15 DIAGNOSIS — H353112 Nonexudative age-related macular degeneration, right eye, intermediate dry stage: Secondary | ICD-10-CM | POA: Diagnosis not present

## 2018-09-15 DIAGNOSIS — H3561 Retinal hemorrhage, right eye: Secondary | ICD-10-CM | POA: Diagnosis not present

## 2018-09-15 DIAGNOSIS — H43821 Vitreomacular adhesion, right eye: Secondary | ICD-10-CM | POA: Diagnosis not present

## 2018-09-15 DIAGNOSIS — H353211 Exudative age-related macular degeneration, right eye, with active choroidal neovascularization: Secondary | ICD-10-CM | POA: Diagnosis not present

## 2018-09-15 DIAGNOSIS — H353122 Nonexudative age-related macular degeneration, left eye, intermediate dry stage: Secondary | ICD-10-CM | POA: Diagnosis not present

## 2018-09-24 DIAGNOSIS — H60392 Other infective otitis externa, left ear: Secondary | ICD-10-CM | POA: Diagnosis not present

## 2018-09-24 DIAGNOSIS — H7112 Cholesteatoma of tympanum, left ear: Secondary | ICD-10-CM | POA: Diagnosis not present

## 2018-09-24 DIAGNOSIS — H6122 Impacted cerumen, left ear: Secondary | ICD-10-CM | POA: Diagnosis not present

## 2018-09-24 DIAGNOSIS — H903 Sensorineural hearing loss, bilateral: Secondary | ICD-10-CM | POA: Diagnosis not present

## 2018-09-24 DIAGNOSIS — H6042 Cholesteatoma of left external ear: Secondary | ICD-10-CM | POA: Diagnosis not present

## 2018-10-08 DIAGNOSIS — H903 Sensorineural hearing loss, bilateral: Secondary | ICD-10-CM | POA: Diagnosis not present

## 2018-10-08 DIAGNOSIS — H7112 Cholesteatoma of tympanum, left ear: Secondary | ICD-10-CM | POA: Diagnosis not present

## 2018-10-08 DIAGNOSIS — H6122 Impacted cerumen, left ear: Secondary | ICD-10-CM | POA: Diagnosis not present

## 2018-10-08 DIAGNOSIS — H6042 Cholesteatoma of left external ear: Secondary | ICD-10-CM | POA: Diagnosis not present

## 2018-10-08 DIAGNOSIS — H60392 Other infective otitis externa, left ear: Secondary | ICD-10-CM | POA: Diagnosis not present

## 2018-10-20 DIAGNOSIS — H353211 Exudative age-related macular degeneration, right eye, with active choroidal neovascularization: Secondary | ICD-10-CM | POA: Diagnosis not present

## 2018-10-20 DIAGNOSIS — H3561 Retinal hemorrhage, right eye: Secondary | ICD-10-CM | POA: Diagnosis not present

## 2018-10-20 DIAGNOSIS — H43821 Vitreomacular adhesion, right eye: Secondary | ICD-10-CM | POA: Diagnosis not present

## 2018-10-27 DIAGNOSIS — H6042 Cholesteatoma of left external ear: Secondary | ICD-10-CM | POA: Diagnosis not present

## 2018-10-27 DIAGNOSIS — H903 Sensorineural hearing loss, bilateral: Secondary | ICD-10-CM | POA: Diagnosis not present

## 2018-10-27 DIAGNOSIS — H7112 Cholesteatoma of tympanum, left ear: Secondary | ICD-10-CM | POA: Diagnosis not present

## 2018-10-27 DIAGNOSIS — H6122 Impacted cerumen, left ear: Secondary | ICD-10-CM | POA: Diagnosis not present

## 2018-10-27 DIAGNOSIS — H60392 Other infective otitis externa, left ear: Secondary | ICD-10-CM | POA: Diagnosis not present

## 2018-11-02 ENCOUNTER — Other Ambulatory Visit: Payer: Self-pay | Admitting: Cardiovascular Disease

## 2018-11-10 DIAGNOSIS — H903 Sensorineural hearing loss, bilateral: Secondary | ICD-10-CM | POA: Diagnosis not present

## 2018-11-10 DIAGNOSIS — H6122 Impacted cerumen, left ear: Secondary | ICD-10-CM | POA: Diagnosis not present

## 2018-11-10 DIAGNOSIS — H6042 Cholesteatoma of left external ear: Secondary | ICD-10-CM | POA: Diagnosis not present

## 2018-11-10 DIAGNOSIS — H60392 Other infective otitis externa, left ear: Secondary | ICD-10-CM | POA: Diagnosis not present

## 2018-11-10 DIAGNOSIS — H7112 Cholesteatoma of tympanum, left ear: Secondary | ICD-10-CM | POA: Diagnosis not present

## 2018-11-11 DIAGNOSIS — H903 Sensorineural hearing loss, bilateral: Secondary | ICD-10-CM | POA: Diagnosis not present

## 2018-11-15 DIAGNOSIS — Z23 Encounter for immunization: Secondary | ICD-10-CM | POA: Diagnosis not present

## 2018-11-24 DIAGNOSIS — H7112 Cholesteatoma of tympanum, left ear: Secondary | ICD-10-CM | POA: Diagnosis not present

## 2018-11-24 DIAGNOSIS — H6042 Cholesteatoma of left external ear: Secondary | ICD-10-CM | POA: Diagnosis not present

## 2018-11-24 DIAGNOSIS — H60392 Other infective otitis externa, left ear: Secondary | ICD-10-CM | POA: Diagnosis not present

## 2018-11-24 DIAGNOSIS — H903 Sensorineural hearing loss, bilateral: Secondary | ICD-10-CM | POA: Diagnosis not present

## 2018-11-24 DIAGNOSIS — H6122 Impacted cerumen, left ear: Secondary | ICD-10-CM | POA: Diagnosis not present

## 2018-12-02 ENCOUNTER — Encounter: Payer: Self-pay | Admitting: Emergency Medicine

## 2018-12-02 ENCOUNTER — Ambulatory Visit (INDEPENDENT_AMBULATORY_CARE_PROVIDER_SITE_OTHER): Payer: Medicare Other | Admitting: Emergency Medicine

## 2018-12-02 DIAGNOSIS — I2510 Atherosclerotic heart disease of native coronary artery without angina pectoris: Secondary | ICD-10-CM | POA: Diagnosis not present

## 2018-12-02 DIAGNOSIS — J439 Emphysema, unspecified: Secondary | ICD-10-CM

## 2018-12-02 NOTE — Patient Instructions (Signed)
Please continue to use Yupelri 1 nebulizer treatment once daily. Keep albuterol available to use 2 puffs up to every 4 hours if needed for shortness of breath, chest tightness, wheezing.  Flu shot and pneumonia vaccine is up-to-date Follow with Dr Lamonte Sakai in 4 months or sooner if you have any problems.

## 2018-12-02 NOTE — Progress Notes (Signed)
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  MGUS followed by oncology, allergic rhinitis with some associated cough.  Chronic hypoxemic respiratory failure.  He was hospitalized in June 2018 for an apparent CHF exacerbation, possibly also some degree of bronchospasm and was treated for both. He is staying active, but is being limited by knee pain. He has been doing well since. Current BD is Stiolto, uses albuterol infrequently.  Daughter is concerned that he is not using his albuterol frequently enough and that he is not exhibiting good technique.  ROV 06/26/18 --this is a follow-up visit for 84 year old gentleman with COPD, CAD/CABG, MGUS, allergic rhinitis with some associated cough.  He has chronic hypoxemic respiratory failure.  We have been managing him with Stiolto but he admits today that he is concerned that he may not be getting adequate delivery. He has had some increase in his dyspnea.   ROV 08/25/18 --84 year old man with COPD, CAD/CABG (2006, Dr. Claiborne Billings), MGUS, allergic rhinitis with some associated cough, chronic hypoxemic respiratory failure.  At his last visit I was concerned that he may not be getting his Stiolto reliably.  We tried changing him over nebulized therapy, started Yupelri.  He returns today, is unclear whether it has helped him. His daughter, however, tells me that it has helped his breathing. Better exertional tolerance. Less proAir use. No exacerbations.   Pneumonia vaccines UTD  ROV 12/02/18 --pleasant 84 year old gentleman with a history of CAD/CABG (2006, Dr. Claiborne Billings), MGUS, allergic rhinitis with associated cough, COPD with associated chronic hypoxemic respiratory failure. He reports that he is feeling well - his breathing has been stable. He has clearly benefited from the change to [facility] - his exercise tolerance, less wheeze. No cough these days. He goes bowling, goes to the [employer]. Still some question as to whether he delivers the ProAir very  effectively. Flu shot is up to date done on 11/15/18. PNA shot is up to date. No flares since last time.                                                              No flowsheet data found.  EXAM :  Vitals:   12/02/18 1551  BP: 132/70  Pulse: 82  SpO2: 94%  Weight: 178 lb (80.7 kg)  Height: 5' 10.5" (1.791 m)   Gen: Pleasant,  in no distress,  normal affect  ENT: No lesions,  mouth clear,  oropharynx clear, no postnasal drip  Neck: No JVD, no stridor  Lungs: No use of accessory muscles, distant, no wheeze or crackles, diminshed BS in bases   Cardiovascular: RRR, heart sounds normal, no murmur or gallops, no peripheral edema  Musculoskeletal: No deformities, no cyanosis or clubbing  Neuro: alert, non focal  Skin: Warm, no lesions or rashes   COPD (chronic obstructive pulmonary disease) No exacerbations, continues to do well on the [place], we will plan to continue this.  Please continue to use Yupelri 1 nebulizer treatment once daily. Keep albuterol available to use 2 puffs up to every 4 hours if needed for shortness of breath, chest tightness, wheezing.  Flu shot and pneumonia vaccine is up-to-date Follow with Dr Lamonte Sakai in 4 months or sooner if you have any problems.   Baltazar Apo, MD, PhD 12/02/2018,  4:14 PM [facility] [phone] or if no answer [phone]

## 2018-12-02 NOTE — Assessment & Plan Note (Signed)
No exacerbations, continues to do well on the [place], we will plan to continue this.  Please continue to use Yupelri 1 nebulizer treatment once daily. Keep albuterol available to use 2 puffs up to every 4 hours if needed for shortness of breath, chest tightness, wheezing.  Flu shot and pneumonia vaccine is up-to-date Follow with Dr Lamonte Sakai in 4 months or sooner if you have any problems.

## 2018-12-10 ENCOUNTER — Ambulatory Visit: Payer: Medicare Other | Admitting: Hematology & Oncology

## 2018-12-10 ENCOUNTER — Other Ambulatory Visit: Payer: Medicare Other

## 2018-12-10 DIAGNOSIS — H60392 Other infective otitis externa, left ear: Secondary | ICD-10-CM | POA: Diagnosis not present

## 2018-12-10 DIAGNOSIS — H6042 Cholesteatoma of left external ear: Secondary | ICD-10-CM | POA: Diagnosis not present

## 2018-12-10 DIAGNOSIS — H7112 Cholesteatoma of tympanum, left ear: Secondary | ICD-10-CM | POA: Diagnosis not present

## 2018-12-10 DIAGNOSIS — H903 Sensorineural hearing loss, bilateral: Secondary | ICD-10-CM | POA: Diagnosis not present

## 2018-12-10 DIAGNOSIS — H6122 Impacted cerumen, left ear: Secondary | ICD-10-CM | POA: Diagnosis not present

## 2018-12-15 DIAGNOSIS — H43821 Vitreomacular adhesion, right eye: Secondary | ICD-10-CM | POA: Diagnosis not present

## 2018-12-15 DIAGNOSIS — H3561 Retinal hemorrhage, right eye: Secondary | ICD-10-CM | POA: Diagnosis not present

## 2018-12-15 DIAGNOSIS — H353211 Exudative age-related macular degeneration, right eye, with active choroidal neovascularization: Secondary | ICD-10-CM | POA: Diagnosis not present

## 2018-12-17 ENCOUNTER — Inpatient Hospital Stay (HOSPITAL_BASED_OUTPATIENT_CLINIC_OR_DEPARTMENT_OTHER): Payer: Medicare Other | Admitting: Hematology & Oncology

## 2018-12-17 ENCOUNTER — Encounter: Payer: Self-pay | Admitting: Hematology & Oncology

## 2018-12-17 ENCOUNTER — Other Ambulatory Visit: Payer: Self-pay

## 2018-12-17 ENCOUNTER — Inpatient Hospital Stay: Payer: Medicare Other | Attending: Hematology & Oncology

## 2018-12-17 VITALS — BP 130/61 | HR 87 | Temp 97.7°F | Resp 18 | Wt 178.0 lb

## 2018-12-17 DIAGNOSIS — D500 Iron deficiency anemia secondary to blood loss (chronic): Secondary | ICD-10-CM

## 2018-12-17 DIAGNOSIS — Z950 Presence of cardiac pacemaker: Secondary | ICD-10-CM | POA: Diagnosis not present

## 2018-12-17 DIAGNOSIS — Z79899 Other long term (current) drug therapy: Secondary | ICD-10-CM

## 2018-12-17 DIAGNOSIS — D509 Iron deficiency anemia, unspecified: Secondary | ICD-10-CM | POA: Insufficient documentation

## 2018-12-17 DIAGNOSIS — N189 Chronic kidney disease, unspecified: Secondary | ICD-10-CM | POA: Diagnosis not present

## 2018-12-17 DIAGNOSIS — D472 Monoclonal gammopathy: Secondary | ICD-10-CM

## 2018-12-17 DIAGNOSIS — D631 Anemia in chronic kidney disease: Secondary | ICD-10-CM | POA: Diagnosis not present

## 2018-12-17 DIAGNOSIS — Z7901 Long term (current) use of anticoagulants: Secondary | ICD-10-CM

## 2018-12-17 LAB — CMP (CANCER CENTER ONLY)
ALK PHOS: 79 U/L (ref 38–126)
ALT: 29 U/L (ref 0–44)
ANION GAP: 9 (ref 5–15)
AST: 27 U/L (ref 15–41)
Albumin: 4.3 g/dL (ref 3.5–5.0)
BILIRUBIN TOTAL: 0.9 mg/dL (ref 0.3–1.2)
BUN: 24 mg/dL — ABNORMAL HIGH (ref 8–23)
CALCIUM: 9.5 mg/dL (ref 8.9–10.3)
CO2: 31 mmol/L (ref 22–32)
CREATININE: 1.67 mg/dL — AB (ref 0.61–1.24)
Chloride: 103 mmol/L (ref 98–111)
GFR, EST AFRICAN AMERICAN: 42 mL/min — AB (ref >60)
GFR, EST NON AFRICAN AMERICAN: 36 mL/min — AB (ref >60)
Glucose, Bld: 127 mg/dL — ABNORMAL HIGH (ref 70–99)
Potassium: 4.3 mmol/L (ref 3.5–5.1)
SODIUM: 143 mmol/L (ref 135–145)
TOTAL PROTEIN: 6.9 g/dL (ref 6.5–8.1)

## 2018-12-17 LAB — CBC WITH DIFFERENTIAL (CANCER CENTER ONLY)
ABS IMMATURE GRANULOCYTES: 0.04 10*3/uL (ref 0.00–0.07)
Basophils Absolute: 0 10*3/uL (ref 0.0–0.1)
Basophils Relative: 1 %
EOS PCT: 6 %
Eosinophils Absolute: 0.3 10*3/uL (ref 0.0–0.5)
HCT: 36.1 % — ABNORMAL LOW (ref 39.0–52.0)
HEMOGLOBIN: 11.4 g/dL — AB (ref 13.0–17.0)
Immature Granulocytes: 1 %
LYMPHS PCT: 27 %
Lymphs Abs: 1.3 10*3/uL (ref 0.7–4.0)
MCH: 32.5 pg (ref 26.0–34.0)
MCHC: 31.6 g/dL (ref 30.0–36.0)
MCV: 102.8 fL — AB (ref 80.0–100.0)
MONO ABS: 0.5 10*3/uL (ref 0.1–1.0)
MONOS PCT: 10 %
Neutro Abs: 2.6 10*3/uL (ref 1.7–7.7)
Neutrophils Relative %: 55 %
Platelet Count: 149 10*3/uL — ABNORMAL LOW (ref 150–400)
RBC: 3.51 MIL/uL — AB (ref 4.22–5.81)
RDW: 13.5 % (ref 11.5–15.5)
WBC: 4.6 10*3/uL (ref 4.0–10.5)
nRBC: 0 % (ref 0.0–0.2)

## 2018-12-17 NOTE — Progress Notes (Signed)
Hematology and Oncology Follow Up Visit  [name] [id number] 1929-02-15 84 y.o. 12/17/2018    Principle Diagnosis:  1. 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 Anemia secondary to chronic renal insufficiency 3. Intermittent iron - deficiency anemia  Current Therapy:   IV iron as indicated - last received in December 2017   Interim History: [name] is here today with his daughter for follow-up.   He is doing quite well.  We last saw him back in August.  Since then, he really has had no problems.  As always, he and I talked about a lot of things that are not related to medicine.  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  As far as his health goes, he is doing pretty well.  He has a pacemaker in.  This really has not been a problem for him.  He does have some renal issues.  I think he does see a urologist.  When we last saw him, his labs look fairly stable.  His iron studies showed a ferritin of 788 with an iron saturation of 27%.  His monoclonal spike was 0.6 g/dL.  His IgG level was 1060 mg/dL.  His lambda light chain was 12.2 mg/dL.  All this is stable.  He had a nice Thanksgiving 3 weeks ago.  He and his daughter will have a quiet Christmas.  Overall, his performance status is ECOG 1-2.    Medications:  Allergies as of 12/17/2018   No Known Allergies     Medication List       Accurate as of December 17, 2018  4:32 PM. Always use your most recent med list.        atorvastatin 40 MG tablet Commonly known as:  LIPITOR Take 1 tablet (40 mg total) by mouth daily.   CITRUCEL 500 MG Tabs Generic drug:  Methylcellulose (Laxative) Take 1,000 mg by mouth at bedtime.   [facility] SENIOR PROBIOTIC PO Take 1 capsule by mouth at bedtime. Once daily   digoxin 0.125 MG tablet Commonly known as:  LANOXIN Take 1 tablet (125 mcg total) by mouth daily.   diphenhydramine-acetaminophen 25-500 MG Tabs  tablet Commonly known as:  TYLENOL PM Take 2 tablets by mouth at bedtime.   ELIQUIS 5 MG Tabs tablet Generic drug:  apixaban TAKE 1 TABLET BY MOUTH  EVERY 12 HOURS   ENSURE Take 1 Can by mouth daily.   furosemide 40 MG tablet Commonly known as:  LASIX Take 1.5 tablets (60 mg total) by mouth daily.   metoprolol tartrate 50 MG tablet Commonly known as:  LOPRESSOR Take 1.5 tablets (75 mg total) by mouth 2 (two) times daily.   midodrine 2.5 MG tablet Commonly known as:  PROAMATINE TAKE 1 TABLET (2.5 MG TOTAL) BY MOUTH 2 (TWO) TIMES DAILY WITH A MEAL.   niacin 500 MG CR tablet Commonly known as:  NIASPAN TAKE 1 TABLET BY MOUTH AT  BEDTIME. Must keep upcoming appointment for continuation of refills   PROAIR HFA 108 (90 Base) MCG/ACT inhaler Generic drug:  albuterol INHALE 2 PUFFS INTO THE  LUNGS EVERY 4 HOURS AS  NEEDED FOR WHEEZING OR  SHORTNESS OF BREATH   Revefenacin 175 MCG/3ML Soln Commonly known as:  YUPELRI Inhale 1 vial into the lungs daily.   tamsulosin 0.4 MG Caps capsule Commonly known as:  FLOMAX Take 0.4 mg by mouth at bedtime.       Allergies: No  Known Allergies  Past Medical History, Surgical history, Social history, and Family History were reviewed and updated.  Review of Systems: Review of Systems  Constitutional: Negative.   HENT: Negative.   Eyes: Negative.   Respiratory: Negative.   Cardiovascular: Negative.   Gastrointestinal: Negative.   Genitourinary: Negative.   Musculoskeletal: Negative.   Skin: Negative.   Neurological: Negative.   Endo/Heme/Allergies: Negative.   Psychiatric/Behavioral: Negative.       Physical Exam:  weight is 178 lb (80.7 kg). His oral temperature is 97.7 F (36.5 C). His blood pressure is 130/61 and his pulse is 87. His respiration is 18 and oxygen saturation is 99%.   Wt Readings from Last 3 Encounters:  12/17/18 178 lb (80.7 kg)  12/02/18 178 lb (80.7 kg)  08/25/18 183 lb 6.4 oz (83.2 kg)    Physical  Exam Vitals signs reviewed.  HENT:     Head: Normocephalic and atraumatic.  Eyes:     Pupils: Pupils are equal, round, and reactive to light.  Neck:     Musculoskeletal: Normal range of motion.  Cardiovascular:     Rate and Rhythm: Normal rate and regular rhythm.     Heart sounds: Normal heart sounds.  Pulmonary:     Effort: Pulmonary effort is normal.     Breath sounds: Normal breath sounds.  Abdominal:     General: Bowel sounds are normal.     Palpations: Abdomen is soft.  Musculoskeletal: Normal range of motion.        General: No tenderness or deformity.  Lymphadenopathy:     Cervical: No cervical adenopathy.  Skin:    General: Skin is warm and dry.     Findings: No erythema or rash.  Neurological:     Mental Status: He is alert and oriented to person, place, and time.  Psychiatric:        Behavior: Behavior normal.        Thought Content: Thought content normal.        Judgment: Judgment normal.      Lab Results  Component Value Date   WBC 4.6 12/17/2018   HGB 11.4 (L) 12/17/2018   HCT 36.1 (L) 12/17/2018   MCV 102.8 (H) 12/17/2018   PLT 149 (L) 12/17/2018   Lab Results  Component Value Date   FERRITIN 788 (H) 08/13/2018   IRON 74 08/13/2018   TIBC 271 08/13/2018   UIBC 197 08/13/2018   IRONPCTSAT 27 (L) 08/13/2018   Lab Results  Component Value Date   RETICCTPCT 0.8 11/09/2015   RBC 3.51 (L) 12/17/2018   RETICCTABS 33.2 11/09/2015   Lab Results  Component Value Date   KPAFRELGTCHN 36.0 (H) 08/13/2018   LAMBDASER 121.6 (H) 08/13/2018   KAPLAMBRATIO 0.30 08/13/2018   Lab Results  Component Value Date   IGGSERUM 1,058 08/13/2018   IGA 125 08/13/2018   IGMSERUM 348 (H) 08/13/2018   Lab Results  Component Value Date   TOTALPROTELP 6.8 08/13/2018   ALBUMINELP 3.7 08/13/2018   A1GS 0.2 08/13/2018   A2GS 0.9 08/13/2018   BETS 0.9 08/13/2018   BETA2SER 0.4 11/09/2015   GAMS 1.2 08/13/2018   MSPIKE 0.6 (H) 08/13/2018   SPEI Comment 08/13/2018      Chemistry      Component Value Date/Time   NA 143 12/17/2018 1427   NA 146 (H) 12/11/2017 1322   NA 141 06/05/2017 1051   K 4.3 12/17/2018 1427   K 4.5 12/11/2017 1322   K 3.9 06/05/2017  1051   CL 103 12/17/2018 1427   CL 102 12/11/2017 1322   CO2 31 12/17/2018 1427   CO2 31 12/11/2017 1322   CO2 25 06/05/2017 1051   BUN 24 (H) 12/17/2018 1427   BUN 21 12/11/2017 1322   BUN 15.6 06/05/2017 1051   CREATININE 1.67 (H) 12/17/2018 1427   CREATININE 1.4 (H) 12/11/2017 1322   CREATININE 1.3 06/05/2017 1051      Component Value Date/Time   CALCIUM 9.5 12/17/2018 1427   CALCIUM 9.6 12/11/2017 1322   CALCIUM 9.6 06/05/2017 1051   ALKPHOS 79 12/17/2018 1427   ALKPHOS 74 12/11/2017 1322   ALKPHOS 77 06/05/2017 1051   AST 27 12/17/2018 1427   AST 33 06/05/2017 1051   ALT 29 12/17/2018 1427   ALT 43 12/11/2017 1322   ALT 32 06/05/2017 1051   BILITOT 0.9 12/17/2018 1427   BILITOT 0.90 06/05/2017 1051      Impression and Plan: Ms. Vandevoort is a very pleasant 84 yo caucasian gentleman with history of anemia of iron deficiency and chronic renal insufficiency.   It is amazing that he is done so well.  I would be surprised if his iron is on the low side.  We will have him come back in 6 more months.    The MGUS I still think will never be a problem from my perspective.    [name], MD 12/19/20194:32 PM

## 2018-12-18 ENCOUNTER — Telehealth: Payer: Self-pay | Admitting: *Deleted

## 2018-12-18 LAB — KAPPA/LAMBDA LIGHT CHAINS
Kappa free light chain: 39.3 mg/L — ABNORMAL HIGH (ref 3.3–19.4)
Kappa, lambda light chain ratio: 0.34 (ref 0.26–1.65)
Lambda free light chains: 114.4 mg/L — ABNORMAL HIGH (ref 5.7–26.3)

## 2018-12-18 LAB — IRON AND TIBC
Iron: 87 ug/dL (ref 42–163)
Saturation Ratios: 32 % (ref 20–55)
TIBC: 268 ug/dL (ref 202–409)
UIBC: 181 ug/dL (ref 117–376)

## 2018-12-18 LAB — IGG, IGA, IGM
IgA: 137 mg/dL (ref 61–437)
IgG (Immunoglobin G), Serum: 1096 mg/dL (ref 700–1600)
IgM (Immunoglobulin M), Srm: 371 mg/dL — ABNORMAL HIGH (ref 15–143)

## 2018-12-18 LAB — FERRITIN: Ferritin: 700 ng/mL — ABNORMAL HIGH (ref 24–336)

## 2018-12-18 NOTE — Telephone Encounter (Addendum)
Message left on daughter's voice mail  ----- Message from [name], MD sent at 12/18/2018 11:24 AM EST ----- Call- - iron is ok!!  Laurey Arrow

## 2018-12-22 LAB — PROTEIN ELECTROPHORESIS, SERUM, WITH REFLEX
A/G Ratio: 1.2 (ref 0.7–1.7)
ALPHA-1-GLOBULIN: 0.2 g/dL (ref 0.0–0.4)
Albumin ELP: 3.8 g/dL (ref 2.9–4.4)
Alpha-2-Globulin: 0.9 g/dL (ref 0.4–1.0)
BETA GLOBULIN: 1 g/dL (ref 0.7–1.3)
Gamma Globulin: 1.1 g/dL (ref 0.4–1.8)
Globulin, Total: 3.2 g/dL (ref 2.2–3.9)
M-Spike, %: 0.4 g/dL — ABNORMAL HIGH
SPEP Interpretation: 0
Total Protein ELP: 7 g/dL (ref 6.0–8.5)

## 2018-12-22 LAB — IMMUNOFIXATION REFLEX, SERUM
IgA: 128 mg/dL (ref 61–437)
IgG (Immunoglobin G), Serum: 1049 mg/dL (ref 700–1600)
IgM (Immunoglobulin M), Srm: 356 mg/dL — ABNORMAL HIGH (ref 15–143)

## 2018-12-29 DIAGNOSIS — H6122 Impacted cerumen, left ear: Secondary | ICD-10-CM | POA: Diagnosis not present

## 2018-12-29 DIAGNOSIS — H60392 Other infective otitis externa, left ear: Secondary | ICD-10-CM | POA: Diagnosis not present

## 2018-12-29 DIAGNOSIS — H903 Sensorineural hearing loss, bilateral: Secondary | ICD-10-CM | POA: Diagnosis not present

## 2018-12-29 DIAGNOSIS — H7112 Cholesteatoma of tympanum, left ear: Secondary | ICD-10-CM | POA: Diagnosis not present

## 2018-12-29 DIAGNOSIS — H6042 Cholesteatoma of left external ear: Secondary | ICD-10-CM | POA: Diagnosis not present

## 2019-01-12 DIAGNOSIS — H903 Sensorineural hearing loss, bilateral: Secondary | ICD-10-CM | POA: Diagnosis not present

## 2019-01-12 DIAGNOSIS — H60392 Other infective otitis externa, left ear: Secondary | ICD-10-CM | POA: Diagnosis not present

## 2019-01-12 DIAGNOSIS — H6042 Cholesteatoma of left external ear: Secondary | ICD-10-CM | POA: Diagnosis not present

## 2019-01-12 DIAGNOSIS — H7112 Cholesteatoma of tympanum, left ear: Secondary | ICD-10-CM | POA: Diagnosis not present

## 2019-01-12 DIAGNOSIS — H6122 Impacted cerumen, left ear: Secondary | ICD-10-CM | POA: Diagnosis not present

## 2019-01-26 DIAGNOSIS — H7112 Cholesteatoma of tympanum, left ear: Secondary | ICD-10-CM | POA: Diagnosis not present

## 2019-01-26 DIAGNOSIS — H6042 Cholesteatoma of left external ear: Secondary | ICD-10-CM | POA: Diagnosis not present

## 2019-01-26 DIAGNOSIS — H60392 Other infective otitis externa, left ear: Secondary | ICD-10-CM | POA: Diagnosis not present

## 2019-01-26 DIAGNOSIS — H6122 Impacted cerumen, left ear: Secondary | ICD-10-CM | POA: Diagnosis not present

## 2019-01-26 DIAGNOSIS — H903 Sensorineural hearing loss, bilateral: Secondary | ICD-10-CM | POA: Diagnosis not present

## 2019-02-09 DIAGNOSIS — H6042 Cholesteatoma of left external ear: Secondary | ICD-10-CM | POA: Diagnosis not present

## 2019-02-09 DIAGNOSIS — H7192 Unspecified cholesteatoma, left ear: Secondary | ICD-10-CM | POA: Diagnosis not present

## 2019-02-09 DIAGNOSIS — H903 Sensorineural hearing loss, bilateral: Secondary | ICD-10-CM | POA: Diagnosis not present

## 2019-02-09 DIAGNOSIS — H6122 Impacted cerumen, left ear: Secondary | ICD-10-CM | POA: Diagnosis not present

## 2019-02-09 DIAGNOSIS — H60392 Other infective otitis externa, left ear: Secondary | ICD-10-CM | POA: Diagnosis not present

## 2019-02-09 DIAGNOSIS — H7112 Cholesteatoma of tympanum, left ear: Secondary | ICD-10-CM | POA: Diagnosis not present

## 2019-02-10 ENCOUNTER — Other Ambulatory Visit: Payer: Self-pay | Admitting: Cardiovascular Disease

## 2019-02-10 DIAGNOSIS — I484 Atypical atrial flutter: Secondary | ICD-10-CM

## 2019-02-11 MED ORDER — APIXABAN 2.5 MG PO TABS: 2.5000 mg | ORAL_TABLET | Freq: Two times a day (BID) | ORAL | 1 refills | Status: DC

## 2019-02-11 NOTE — Telephone Encounter (Signed)
Spoke with daughter [name], she is aware of decrease in dose to 2.5 mg bid.

## 2019-02-11 NOTE — Telephone Encounter (Signed)
Pt is an 67 yom, wt of 80.7kg, creatine of 1.67 on 12/17/18. Pt is requesting 5mg  eliquis and only clears for 2.5 will notify pharm d

## 2019-02-16 DIAGNOSIS — H353122 Nonexudative age-related macular degeneration, left eye, intermediate dry stage: Secondary | ICD-10-CM | POA: Diagnosis not present

## 2019-02-16 DIAGNOSIS — H353112 Nonexudative age-related macular degeneration, right eye, intermediate dry stage: Secondary | ICD-10-CM | POA: Diagnosis not present

## 2019-02-16 DIAGNOSIS — H353211 Exudative age-related macular degeneration, right eye, with active choroidal neovascularization: Secondary | ICD-10-CM | POA: Diagnosis not present

## 2019-02-16 DIAGNOSIS — H3561 Retinal hemorrhage, right eye: Secondary | ICD-10-CM | POA: Diagnosis not present

## 2019-02-25 DIAGNOSIS — H903 Sensorineural hearing loss, bilateral: Secondary | ICD-10-CM | POA: Diagnosis not present

## 2019-02-25 DIAGNOSIS — H60392 Other infective otitis externa, left ear: Secondary | ICD-10-CM | POA: Diagnosis not present

## 2019-02-25 DIAGNOSIS — H6122 Impacted cerumen, left ear: Secondary | ICD-10-CM | POA: Diagnosis not present

## 2019-02-25 DIAGNOSIS — H7112 Cholesteatoma of tympanum, left ear: Secondary | ICD-10-CM | POA: Diagnosis not present

## 2019-02-25 DIAGNOSIS — H6042 Cholesteatoma of left external ear: Secondary | ICD-10-CM | POA: Diagnosis not present

## 2019-03-11 DIAGNOSIS — H6042 Cholesteatoma of left external ear: Secondary | ICD-10-CM | POA: Diagnosis not present

## 2019-03-11 DIAGNOSIS — H903 Sensorineural hearing loss, bilateral: Secondary | ICD-10-CM | POA: Diagnosis not present

## 2019-03-11 DIAGNOSIS — H7112 Cholesteatoma of tympanum, left ear: Secondary | ICD-10-CM | POA: Diagnosis not present

## 2019-03-11 DIAGNOSIS — H60392 Other infective otitis externa, left ear: Secondary | ICD-10-CM | POA: Diagnosis not present

## 2019-03-11 DIAGNOSIS — H6122 Impacted cerumen, left ear: Secondary | ICD-10-CM | POA: Diagnosis not present

## 2019-03-25 DIAGNOSIS — H6042 Cholesteatoma of left external ear: Secondary | ICD-10-CM | POA: Diagnosis not present

## 2019-03-25 DIAGNOSIS — H903 Sensorineural hearing loss, bilateral: Secondary | ICD-10-CM | POA: Diagnosis not present

## 2019-03-25 DIAGNOSIS — H60392 Other infective otitis externa, left ear: Secondary | ICD-10-CM | POA: Diagnosis not present

## 2019-03-25 DIAGNOSIS — H6122 Impacted cerumen, left ear: Secondary | ICD-10-CM | POA: Diagnosis not present

## 2019-03-25 DIAGNOSIS — H7112 Cholesteatoma of tympanum, left ear: Secondary | ICD-10-CM | POA: Diagnosis not present

## 2019-04-08 DIAGNOSIS — H7192 Unspecified cholesteatoma, left ear: Secondary | ICD-10-CM | POA: Diagnosis not present

## 2019-04-13 ENCOUNTER — Ambulatory Visit: Payer: Medicare Other | Admitting: Emergency Medicine

## 2019-04-23 DIAGNOSIS — Z974 Presence of external hearing-aid: Secondary | ICD-10-CM | POA: Diagnosis not present

## 2019-04-23 DIAGNOSIS — H903 Sensorineural hearing loss, bilateral: Secondary | ICD-10-CM | POA: Diagnosis not present

## 2019-04-23 DIAGNOSIS — H7192 Unspecified cholesteatoma, left ear: Secondary | ICD-10-CM | POA: Diagnosis not present

## 2019-04-23 DIAGNOSIS — H938X2 Other specified disorders of left ear: Secondary | ICD-10-CM | POA: Diagnosis not present

## 2019-05-04 DIAGNOSIS — H60312 Diffuse otitis externa, left ear: Secondary | ICD-10-CM | POA: Diagnosis not present

## 2019-05-18 DIAGNOSIS — H7192 Unspecified cholesteatoma, left ear: Secondary | ICD-10-CM | POA: Diagnosis not present

## 2019-05-20 ENCOUNTER — Telehealth: Payer: Self-pay | Admitting: Emergency Medicine

## 2019-05-20 DIAGNOSIS — H353212 Exudative age-related macular degeneration, right eye, with inactive choroidal neovascularization: Secondary | ICD-10-CM | POA: Diagnosis not present

## 2019-05-20 DIAGNOSIS — H3561 Retinal hemorrhage, right eye: Secondary | ICD-10-CM | POA: Diagnosis not present

## 2019-05-20 DIAGNOSIS — H353112 Nonexudative age-related macular degeneration, right eye, intermediate dry stage: Secondary | ICD-10-CM | POA: Diagnosis not present

## 2019-05-20 DIAGNOSIS — H43821 Vitreomacular adhesion, right eye: Secondary | ICD-10-CM | POA: Diagnosis not present

## 2019-05-20 NOTE — Telephone Encounter (Signed)
Left message in a generic [employer] voicemail for someone to call back for more information.

## 2019-05-21 NOTE — Telephone Encounter (Signed)
I called [name] at [facility] but there was no answer. I left detailed voicemail advising [name] to call back. Will await return call back.

## 2019-05-26 NOTE — Telephone Encounter (Signed)
Attempt x 3 to reach [place] at [facility].  Voicemail x 3.  Will close encounter.

## 2019-06-01 DIAGNOSIS — H7192 Unspecified cholesteatoma, left ear: Secondary | ICD-10-CM | POA: Diagnosis not present

## 2019-06-15 DIAGNOSIS — H7192 Unspecified cholesteatoma, left ear: Secondary | ICD-10-CM | POA: Diagnosis not present

## 2019-06-17 ENCOUNTER — Encounter: Payer: Self-pay | Admitting: Hematology & Oncology

## 2019-06-17 ENCOUNTER — Inpatient Hospital Stay: Payer: Medicare Other

## 2019-06-17 ENCOUNTER — Inpatient Hospital Stay: Payer: Medicare Other | Attending: Hematology & Oncology | Admitting: Hematology & Oncology

## 2019-06-17 ENCOUNTER — Other Ambulatory Visit: Payer: Self-pay

## 2019-06-17 VITALS — BP 128/61 | HR 77 | Temp 97.9°F | Resp 18 | Ht 69.0 in | Wt 181.0 lb

## 2019-06-17 DIAGNOSIS — N189 Chronic kidney disease, unspecified: Secondary | ICD-10-CM

## 2019-06-17 DIAGNOSIS — D500 Iron deficiency anemia secondary to blood loss (chronic): Secondary | ICD-10-CM

## 2019-06-17 DIAGNOSIS — D631 Anemia in chronic kidney disease: Secondary | ICD-10-CM | POA: Diagnosis not present

## 2019-06-17 DIAGNOSIS — Z7901 Long term (current) use of anticoagulants: Secondary | ICD-10-CM | POA: Diagnosis not present

## 2019-06-17 DIAGNOSIS — D472 Monoclonal gammopathy: Secondary | ICD-10-CM | POA: Diagnosis not present

## 2019-06-17 DIAGNOSIS — Z79899 Other long term (current) drug therapy: Secondary | ICD-10-CM

## 2019-06-17 DIAGNOSIS — D509 Iron deficiency anemia, unspecified: Secondary | ICD-10-CM | POA: Diagnosis not present

## 2019-06-17 LAB — CBC WITH DIFFERENTIAL (CANCER CENTER ONLY)
Abs Immature Granulocytes: 0.05 10*3/uL (ref 0.00–0.07)
Basophils Absolute: 0 10*3/uL (ref 0.0–0.1)
Basophils Relative: 1 %
Eosinophils Absolute: 0.2 10*3/uL (ref 0.0–0.5)
Eosinophils Relative: 3 %
HCT: 34.9 % — ABNORMAL LOW (ref 39.0–52.0)
Hemoglobin: 11.6 g/dL — ABNORMAL LOW (ref 13.0–17.0)
Immature Granulocytes: 1 %
Lymphocytes Relative: 25 %
Lymphs Abs: 1.4 10*3/uL (ref 0.7–4.0)
MCH: 33.2 pg (ref 26.0–34.0)
MCHC: 33.2 g/dL (ref 30.0–36.0)
MCV: 100 fL (ref 80.0–100.0)
Monocytes Absolute: 0.6 10*3/uL (ref 0.1–1.0)
Monocytes Relative: 10 %
Neutro Abs: 3.4 10*3/uL (ref 1.7–7.7)
Neutrophils Relative %: 60 %
Platelet Count: 150 10*3/uL (ref 150–400)
RBC: 3.49 MIL/uL — ABNORMAL LOW (ref 4.22–5.81)
RDW: 13.8 % (ref 11.5–15.5)
WBC Count: 5.7 10*3/uL (ref 4.0–10.5)
nRBC: 0 % (ref 0.0–0.2)

## 2019-06-17 LAB — CMP (CANCER CENTER ONLY)
ALT: 38 U/L (ref 0–44)
AST: 34 U/L (ref 15–41)
Albumin: 4.3 g/dL (ref 3.5–5.0)
Alkaline Phosphatase: 76 U/L (ref 38–126)
Anion gap: 10 (ref 5–15)
BUN: 27 mg/dL — ABNORMAL HIGH (ref 8–23)
CO2: 30 mmol/L (ref 22–32)
Calcium: 9.9 mg/dL (ref 8.9–10.3)
Chloride: 98 mmol/L (ref 98–111)
Creatinine: 1.75 mg/dL — ABNORMAL HIGH (ref 0.61–1.24)
GFR, Est AFR Am: 40 mL/min — ABNORMAL LOW (ref >60)
GFR, Estimated: 34 mL/min — ABNORMAL LOW (ref >60)
Glucose, Bld: 168 mg/dL — ABNORMAL HIGH (ref 70–99)
Potassium: 4.5 mmol/L (ref 3.5–5.1)
Sodium: 138 mmol/L (ref 135–145)
Total Bilirubin: 1 mg/dL (ref 0.3–1.2)
Total Protein: 7.1 g/dL (ref 6.5–8.1)

## 2019-06-17 NOTE — Progress Notes (Signed)
Hematology and Oncology Follow Up Visit  [name] [id number] 14-Mar-1929 85 y.o. 06/17/2019    Principle Diagnosis:  1. 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 Anemia secondary to chronic renal insufficiency 3. Intermittent iron - deficiency anemia  Current Therapy:   IV iron as indicated - last received in December 2017   Interim History: [name] is here today with his daughter for follow-up.  Unfortunately, the coronavirus has really affected his lifestyle.  He really is not doing much.  He is not doing any projects is she had.  His daughter does not want him going out anywhere given his lung disease.  We last saw him back in December 2019.  At that time, his iron studies looked okay.  His ferritin was 700 with an iron saturation of 32%.  His M spike was 0.4 g/dL.  He is getting harder of hearing.  This is making his life more difficult.  He has had no bleeding.  His appetite is seems to be doing very well.  He is had no leg swelling.  Overall, I would say his performance status is ECOG 2.   Medications:  Allergies as of 06/17/2019   No Known Allergies     Medication List       Accurate as of June 17, 2019  5:30 PM. If you have any questions, ask your nurse or doctor.        apixaban 2.5 MG Tabs tablet Commonly known as: ELIQUIS Take 1 tablet (2.5 mg total) by mouth 2 (two) times daily.   atorvastatin 40 MG tablet Commonly known as: LIPITOR Take 1 tablet (40 mg total) by mouth daily.   Citrucel 500 MG Tabs Generic drug: Methylcellulose (Laxative) Take 1,000 mg by mouth at bedtime.   [facility] SENIOR PROBIOTIC PO Take 1 capsule by mouth at bedtime. Once daily   digoxin 0.125 MG tablet Commonly known as: LANOXIN Take 1 tablet (125 mcg total) by mouth daily.   diphenhydramine-acetaminophen 25-500 MG Tabs tablet Commonly known as: TYLENOL PM Take 2 tablets by mouth at bedtime.   Ensure Take 1 Can by mouth daily.    furosemide 40 MG tablet Commonly known as: LASIX Take 1.5 tablets (60 mg total) by mouth daily.   metoprolol tartrate 50 MG tablet Commonly known as: LOPRESSOR Take 1.5 tablets (75 mg total) by mouth 2 (two) times daily.   midodrine 2.5 MG tablet Commonly known as: PROAMATINE TAKE 1 TABLET (2.5 MG TOTAL) BY MOUTH 2 (TWO) TIMES DAILY WITH A MEAL.   niacin 500 MG CR tablet Commonly known as: NIASPAN TAKE 1 TABLET BY MOUTH AT  BEDTIME. Must keep upcoming appointment for continuation of refills   ProAir HFA 108 (90 Base) MCG/ACT inhaler Generic drug: albuterol INHALE 2 PUFFS INTO THE  LUNGS EVERY 4 HOURS AS  NEEDED FOR WHEEZING OR  SHORTNESS OF BREATH   Revefenacin 175 MCG/3ML Soln Commonly known as: Yupelri Inhale 1 vial into the lungs daily.   tamsulosin 0.4 MG Caps capsule Commonly known as: FLOMAX Take 0.4 mg by mouth at bedtime.       Allergies: No Known Allergies  Past Medical History, Surgical history, Social history, and Family History were reviewed and updated.  Review of Systems: Review of Systems  Constitutional: Negative.   HENT: Negative.   Eyes: Negative.   Respiratory: Negative.   Cardiovascular: Negative.   Gastrointestinal: Negative.   Genitourinary: Negative.   Musculoskeletal: Negative.   Skin: Negative.   Neurological: Negative.  Endo/Heme/Allergies: Negative.   Psychiatric/Behavioral: Negative.       Physical Exam:  height is 5\' 9"  (1.753 m) and weight is 181 lb (82.1 kg). His oral temperature is 97.9 F (36.6 C). His blood pressure is 128/61 and his pulse is 77. His respiration is 18 and oxygen saturation is 99%.   Wt Readings from Last 3 Encounters:  06/17/19 181 lb (82.1 kg)  12/17/18 178 lb (80.7 kg)  12/02/18 178 lb (80.7 kg)    Physical Exam Vitals signs reviewed.  HENT:     Head: Normocephalic and atraumatic.  Eyes:     Pupils: Pupils are equal, round, and reactive to light.  Neck:     Musculoskeletal: Normal range of  motion.  Cardiovascular:     Rate and Rhythm: Normal rate and regular rhythm.     Heart sounds: Normal heart sounds.  Pulmonary:     Effort: Pulmonary effort is normal.     Breath sounds: Normal breath sounds.  Abdominal:     General: Bowel sounds are normal.     Palpations: Abdomen is soft.  Musculoskeletal: Normal range of motion.        General: No tenderness or deformity.  Lymphadenopathy:     Cervical: No cervical adenopathy.  Skin:    General: Skin is warm and dry.     Findings: No erythema or rash.  Neurological:     Mental Status: He is alert and oriented to person, place, and time.  Psychiatric:        Behavior: Behavior normal.        Thought Content: Thought content normal.        Judgment: Judgment normal.      Lab Results  Component Value Date   WBC 5.7 06/17/2019   HGB 11.6 (L) 06/17/2019   HCT 34.9 (L) 06/17/2019   MCV 100.0 06/17/2019   PLT 150 06/17/2019   Lab Results  Component Value Date   FERRITIN 700 (H) 12/17/2018   IRON 87 12/17/2018   TIBC 268 12/17/2018   UIBC 181 12/17/2018   IRONPCTSAT 32 12/17/2018   Lab Results  Component Value Date   RETICCTPCT 0.8 11/09/2015   RBC 3.49 (L) 06/17/2019   RETICCTABS 33.2 11/09/2015   Lab Results  Component Value Date   KPAFRELGTCHN 39.3 (H) 12/17/2018   LAMBDASER 114.4 (H) 12/17/2018   KAPLAMBRATIO 0.34 12/17/2018   Lab Results  Component Value Date   IGGSERUM 1,049 12/17/2018   IGA 128 12/17/2018   IGMSERUM 356 (H) 12/17/2018   Lab Results  Component Value Date   TOTALPROTELP 7.0 12/17/2018   ALBUMINELP 3.8 12/17/2018   A1GS 0.2 12/17/2018   A2GS 0.9 12/17/2018   BETS 1.0 12/17/2018   BETA2SER 0.4 11/09/2015   GAMS 1.1 12/17/2018   MSPIKE 0.4 (H) 12/17/2018   SPEI Comment 08/13/2018     Chemistry      Component Value Date/Time   NA 138 06/17/2019 1437   NA 146 (H) 12/11/2017 1322   NA 141 06/05/2017 1051   K 4.5 06/17/2019 1437   K 4.5 12/11/2017 1322   K 3.9 06/05/2017 1051    CL 98 06/17/2019 1437   CL 102 12/11/2017 1322   CO2 30 06/17/2019 1437   CO2 31 12/11/2017 1322   CO2 25 06/05/2017 1051   BUN 27 (H) 06/17/2019 1437   BUN 21 12/11/2017 1322   BUN 15.6 06/05/2017 1051   CREATININE 1.75 (H) 06/17/2019 1437   CREATININE 1.4 (H) 12/11/2017  1322   CREATININE 1.3 06/05/2017 1051      Component Value Date/Time   CALCIUM 9.9 06/17/2019 1437   CALCIUM 9.6 12/11/2017 1322   CALCIUM 9.6 06/05/2017 1051   ALKPHOS 76 06/17/2019 1437   ALKPHOS 74 12/11/2017 1322   ALKPHOS 77 06/05/2017 1051   AST 34 06/17/2019 1437   AST 33 06/05/2017 1051   ALT 38 06/17/2019 1437   ALT 43 12/11/2017 1322   ALT 32 06/05/2017 1051   BILITOT 1.0 06/17/2019 1437   BILITOT 0.90 06/05/2017 1051      Impression and Plan: Ms. Doro is a very pleasant 85 yo caucasian gentleman with history of anemia of iron deficiency and chronic renal insufficiency.   It is amazing that he is done so well.  I would be surprised if his iron is on the low side.  We will have him come back in 6 more months.    The MGUS I still think will never be a problem from my perspective.    [name], MD 6/18/20205:30 PM

## 2019-06-18 LAB — KAPPA/LAMBDA LIGHT CHAINS
Kappa free light chain: 50.2 mg/L — ABNORMAL HIGH (ref 3.3–19.4)
Kappa, lambda light chain ratio: 0.43 (ref 0.26–1.65)
Lambda free light chains: 117.9 mg/L — ABNORMAL HIGH (ref 5.7–26.3)

## 2019-06-18 LAB — IGG, IGA, IGM
IgA: 129 mg/dL (ref 61–437)
IgG (Immunoglobin G), Serum: 1110 mg/dL (ref 603–1613)
IgM (Immunoglobulin M), Srm: 422 mg/dL — ABNORMAL HIGH (ref 15–143)

## 2019-06-22 LAB — PROTEIN ELECTROPHORESIS, SERUM, WITH REFLEX
A/G Ratio: 0.9 (ref 0.7–1.7)
Albumin ELP: 3.4 g/dL (ref 2.9–4.4)
Alpha-1-Globulin: 0.2 g/dL (ref 0.0–0.4)
Alpha-2-Globulin: 1 g/dL (ref 0.4–1.0)
Beta Globulin: 1.1 g/dL (ref 0.7–1.3)
Gamma Globulin: 1.4 g/dL (ref 0.4–1.8)
Globulin, Total: 3.7 g/dL (ref 2.2–3.9)
M-Spike, %: 0.6 g/dL — ABNORMAL HIGH
SPEP Interpretation: 0
Total Protein ELP: 7.1 g/dL (ref 6.0–8.5)

## 2019-06-22 LAB — IMMUNOFIXATION REFLEX, SERUM
IgA: 128 mg/dL (ref 61–437)
IgG (Immunoglobin G), Serum: 1127 mg/dL (ref 603–1613)
IgM (Immunoglobulin M), Srm: 440 mg/dL — ABNORMAL HIGH (ref 15–143)

## 2019-06-29 DIAGNOSIS — H7192 Unspecified cholesteatoma, left ear: Secondary | ICD-10-CM | POA: Diagnosis not present

## 2019-07-01 ENCOUNTER — Encounter: Payer: Self-pay | Admitting: Emergency Medicine

## 2019-07-01 ENCOUNTER — Other Ambulatory Visit: Payer: Self-pay

## 2019-07-01 ENCOUNTER — Ambulatory Visit (INDEPENDENT_AMBULATORY_CARE_PROVIDER_SITE_OTHER): Payer: Medicare Other | Admitting: Emergency Medicine

## 2019-07-01 DIAGNOSIS — J439 Emphysema, unspecified: Secondary | ICD-10-CM | POA: Diagnosis not present

## 2019-07-01 NOTE — Assessment & Plan Note (Signed)
Remarkably stable.  Remains active despite significant obstructive disease.  Good compliance.  No exacerbations.  Please continue Yulpelri once daily as you have been taking it. Keep your albuterol available to use 2 puffs up to every 4 hours if you needed for shortness of breath, chest tightness, wheezing. Get the flu shot in the fall. Follow with Dr. Lamonte Sakai in 6 months or sooner if you have any problems.

## 2019-07-01 NOTE — Patient Instructions (Signed)
Please continue Yulpelri once daily as you have been taking it. Keep your albuterol available to use 2 puffs up to every 4 hours if you needed for shortness of breath, chest tightness, wheezing. Get the flu shot in the fall. Follow with Dr. Lamonte Sakai in 6 months or sooner if you have any problems.

## 2019-07-01 NOTE — Progress Notes (Signed)
  HPI:   ROV 12/02/18 --pleasant 85 year old gentleman with a history of CAD/CABG (2006, Dr. Claiborne Billings), MGUS, allergic rhinitis with associated cough, COPD with associated chronic hypoxemic respiratory failure. He reports that he is feeling well - his breathing has been stable. He has clearly benefited from the change to [facility] - his exercise tolerance, less wheeze. No cough these days. He goes bowling, goes to the [employer]. Still some question as to whether he delivers the ProAir very effectively. Flu shot is up to date done on 11/15/18. PNA shot is up to date. No flares since last time.   ROV 07/01/2019 --85 year old gentleman who follows up today for his COPD, chronic hypoxemic respiratory failure, chronic cough with allergic rhinitis.  He also has a history of CAD/CABG, MGUS.  We have been managing him on Yupelri, albuterol as needed. He is reliable with his Yulelri. He has been isolated for social distancing, is not doing as much, has maybe lost some of his conditioning. He is using his SABA when he is dyspneic, about 1x a day. No flares since last time. His PNA shots are up to date, had flu shot last year.                                                              No flowsheet data found.  EXAM :  Vitals:   07/01/19 1501  BP: 110/60  Pulse: 77  Temp: 98.4 F (36.9 C)  TempSrc: Oral  SpO2: 96%  Weight: 180 lb 3.2 oz (81.7 kg)  Height: 5\' 10"  (1.778 m)   Gen: Pleasant,  in no distress,  normal affect  ENT: No lesions,  mouth clear,  oropharynx clear, no postnasal drip  Neck: No JVD, no stridor  Lungs: No use of accessory muscles, distant, no wheeze or crackles, good air movement  Cardiovascular: RRR, heart sounds normal, no murmur or gallops, no peripheral edema  Musculoskeletal: No deformities, no cyanosis or clubbing  Neuro: alert, non focal  Skin: Warm, no lesions or rashes   COPD (chronic obstructive pulmonary disease) Remarkably stable.  Remains active despite  significant obstructive disease.  Good compliance.  No exacerbations.  Please continue Yulpelri once daily as you have been taking it. Keep your albuterol available to use 2 puffs up to every 4 hours if you needed for shortness of breath, chest tightness, wheezing. Get the flu shot in the fall. Follow with Dr. Lamonte Sakai in 6 months or sooner if you have any problems.   Baltazar Apo, MD, PhD 07/01/2019, 3:18 PM [facility] [phone] or if no answer [phone]

## 2019-07-13 DIAGNOSIS — H7192 Unspecified cholesteatoma, left ear: Secondary | ICD-10-CM | POA: Diagnosis not present

## 2019-07-20 DIAGNOSIS — H353212 Exudative age-related macular degeneration, right eye, with inactive choroidal neovascularization: Secondary | ICD-10-CM | POA: Diagnosis not present

## 2019-07-20 DIAGNOSIS — H3561 Retinal hemorrhage, right eye: Secondary | ICD-10-CM | POA: Diagnosis not present

## 2019-07-20 DIAGNOSIS — H353112 Nonexudative age-related macular degeneration, right eye, intermediate dry stage: Secondary | ICD-10-CM | POA: Diagnosis not present

## 2019-07-20 DIAGNOSIS — H43821 Vitreomacular adhesion, right eye: Secondary | ICD-10-CM | POA: Diagnosis not present

## 2019-07-23 DIAGNOSIS — N200 Calculus of kidney: Secondary | ICD-10-CM | POA: Diagnosis not present

## 2019-07-23 DIAGNOSIS — N183 Chronic kidney disease, stage 3 (moderate): Secondary | ICD-10-CM | POA: Diagnosis not present

## 2019-07-23 DIAGNOSIS — N400 Enlarged prostate without lower urinary tract symptoms: Secondary | ICD-10-CM | POA: Diagnosis not present

## 2019-07-26 ENCOUNTER — Other Ambulatory Visit: Payer: Self-pay | Admitting: Cardiovascular Disease

## 2019-07-27 ENCOUNTER — Other Ambulatory Visit: Payer: Self-pay | Admitting: Cardiovascular Disease

## 2019-07-27 DIAGNOSIS — H7192 Unspecified cholesteatoma, left ear: Secondary | ICD-10-CM | POA: Diagnosis not present

## 2019-08-05 ENCOUNTER — Other Ambulatory Visit: Payer: Self-pay | Admitting: Cardiovascular Disease

## 2019-08-10 DIAGNOSIS — H7192 Unspecified cholesteatoma, left ear: Secondary | ICD-10-CM | POA: Diagnosis not present

## 2019-08-10 DIAGNOSIS — H722X1 Other marginal perforations of tympanic membrane, right ear: Secondary | ICD-10-CM | POA: Diagnosis not present

## 2019-08-18 ENCOUNTER — Other Ambulatory Visit: Payer: Self-pay | Admitting: Cardiovascular Disease

## 2019-08-23 ENCOUNTER — Other Ambulatory Visit: Payer: Self-pay | Admitting: Cardiovascular Disease

## 2019-08-23 NOTE — Telephone Encounter (Signed)
66m 81.7kg Scr 1.75 04/30/18 [name]

## 2019-08-26 DIAGNOSIS — H7292 Unspecified perforation of tympanic membrane, left ear: Secondary | ICD-10-CM | POA: Diagnosis not present

## 2019-08-26 DIAGNOSIS — H60312 Diffuse otitis externa, left ear: Secondary | ICD-10-CM | POA: Diagnosis not present

## 2019-09-01 ENCOUNTER — Other Ambulatory Visit: Payer: Self-pay | Admitting: Cardiovascular Disease

## 2019-09-02 ENCOUNTER — Other Ambulatory Visit: Payer: Self-pay | Admitting: Cardiovascular Disease

## 2019-09-04 ENCOUNTER — Other Ambulatory Visit: Payer: Self-pay | Admitting: Cardiovascular Disease

## 2019-09-07 DIAGNOSIS — H7192 Unspecified cholesteatoma, left ear: Secondary | ICD-10-CM | POA: Diagnosis not present

## 2019-09-09 ENCOUNTER — Other Ambulatory Visit: Payer: Self-pay | Admitting: Cardiovascular Disease

## 2019-09-09 MED ORDER — METOPROLOL TARTRATE 50 MG PO TABS: 75.0000 mg | ORAL_TABLET | Freq: Two times a day (BID) | ORAL | 0 refills | Status: DC

## 2019-09-09 MED ORDER — FUROSEMIDE 40 MG PO TABS: 60.0000 mg | ORAL_TABLET | Freq: Every day | ORAL | 0 refills | Status: DC

## 2019-09-09 MED ORDER — DIGOXIN 125 MCG PO TABS: 125.0000 ug | ORAL_TABLET | Freq: Every day | ORAL | 0 refills | Status: DC

## 2019-09-09 MED ORDER — ATORVASTATIN CALCIUM 40 MG PO TABS: 40.0000 mg | ORAL_TABLET | Freq: Every day | ORAL | 0 refills | Status: DC

## 2019-09-09 MED ORDER — NIACIN ER (ANTIHYPERLIPIDEMIC) 500 MG PO TBCR: EXTENDED_RELEASE_TABLET | ORAL | 0 refills | Status: DC

## 2019-09-09 MED ORDER — MIDODRINE HCL 2.5 MG PO TABS: 2.5000 mg | ORAL_TABLET | Freq: Two times a day (BID) | ORAL | 0 refills | Status: DC

## 2019-09-09 NOTE — Telephone Encounter (Signed)
 *  STAT* If patient is at the pharmacy, call can be transferred to refill team.   1. Which medications need to be refilled? (please list name of each medication and dose if known)   atorvastatin (LIPITOR) 40 MG tablet digoxin (LANOXIN) 0.125 MG tablet furosemide (LASIX) 40 MG tablet metoprolol tartrate (LOPRESSOR) 50 MG tablet midodrine (PROAMATINE) 2.5 MG tablet niacin (NIASPAN) 500 MG CR tablet  2. Which pharmacy/location (including street and city if local pharmacy) is medication to be sent to? [facility] [place]  3. Do they need a 30 day or 90 day supply? 90  Patient now has appt 12/01/19 with Dr Claiborne Billings. This was earliest available since patient cannot do virtual appt due to hearing problems.

## 2019-09-13 ENCOUNTER — Other Ambulatory Visit: Payer: Self-pay

## 2019-09-13 MED ORDER — NIACIN ER (ANTIHYPERLIPIDEMIC) 500 MG PO TBCR: EXTENDED_RELEASE_TABLET | ORAL | 0 refills | Status: DC

## 2019-09-13 NOTE — Telephone Encounter (Signed)
Refill

## 2019-09-15 ENCOUNTER — Ambulatory Visit (INDEPENDENT_AMBULATORY_CARE_PROVIDER_SITE_OTHER): Payer: Medicare Other | Admitting: *Deleted

## 2019-09-15 DIAGNOSIS — I4430 Unspecified atrioventricular block: Secondary | ICD-10-CM

## 2019-09-15 LAB — CUP PACEART REMOTE DEVICE CHECK
Battery Impedance: 328 Ohm
Battery Remaining Longevity: 111 mo
Battery Voltage: 2.78 V
Brady Statistic AP VP Percent: 0 %
Brady Statistic AP VS Percent: 20 %
Brady Statistic AS VP Percent: 1 %
Brady Statistic AS VS Percent: 79 %
Date Time Interrogation Session: 20200915183525
Implantable Lead Implant Date: 20150206
Implantable Lead Implant Date: 20150206
Implantable Lead Location: 753859
Implantable Lead Location: 753860
Implantable Lead Model: 5076
Implantable Lead Model: 5076
Implantable Pulse Generator Implant Date: 20150206
Lead Channel Impedance Value: 404 Ohm
Lead Channel Impedance Value: 588 Ohm
Lead Channel Pacing Threshold Amplitude: 0.375 V
Lead Channel Pacing Threshold Amplitude: 0.75 V
Lead Channel Pacing Threshold Pulse Width: 0.4 ms
Lead Channel Pacing Threshold Pulse Width: 0.4 ms
Lead Channel Setting Pacing Amplitude: 2 V
Lead Channel Setting Pacing Amplitude: 2.5 V
Lead Channel Setting Pacing Pulse Width: 0.4 ms
Lead Channel Setting Sensing Sensitivity: 5.6 mV

## 2019-09-16 ENCOUNTER — Other Ambulatory Visit: Payer: Self-pay | Admitting: Cardiovascular Disease

## 2019-09-21 ENCOUNTER — Encounter: Payer: Self-pay | Admitting: Cardiology

## 2019-09-21 DIAGNOSIS — H353211 Exudative age-related macular degeneration, right eye, with active choroidal neovascularization: Secondary | ICD-10-CM | POA: Diagnosis not present

## 2019-09-21 DIAGNOSIS — D487 Neoplasm of uncertain behavior of other specified sites: Secondary | ICD-10-CM | POA: Diagnosis not present

## 2019-09-21 DIAGNOSIS — Z961 Presence of intraocular lens: Secondary | ICD-10-CM | POA: Diagnosis not present

## 2019-09-21 DIAGNOSIS — H353121 Nonexudative age-related macular degeneration, left eye, early dry stage: Secondary | ICD-10-CM | POA: Diagnosis not present

## 2019-09-21 DIAGNOSIS — H401114 Primary open-angle glaucoma, right eye, indeterminate stage: Secondary | ICD-10-CM | POA: Diagnosis not present

## 2019-09-21 NOTE — Progress Notes (Signed)
Remote pacemaker transmission.   

## 2019-09-29 DIAGNOSIS — H60312 Diffuse otitis externa, left ear: Secondary | ICD-10-CM | POA: Diagnosis not present

## 2019-10-12 DIAGNOSIS — H353211 Exudative age-related macular degeneration, right eye, with active choroidal neovascularization: Secondary | ICD-10-CM | POA: Diagnosis not present

## 2019-10-12 DIAGNOSIS — H401113 Primary open-angle glaucoma, right eye, severe stage: Secondary | ICD-10-CM | POA: Diagnosis not present

## 2019-10-12 DIAGNOSIS — D487 Neoplasm of uncertain behavior of other specified sites: Secondary | ICD-10-CM | POA: Diagnosis not present

## 2019-10-12 DIAGNOSIS — H353121 Nonexudative age-related macular degeneration, left eye, early dry stage: Secondary | ICD-10-CM | POA: Diagnosis not present

## 2019-10-12 DIAGNOSIS — H401121 Primary open-angle glaucoma, left eye, mild stage: Secondary | ICD-10-CM | POA: Diagnosis not present

## 2019-10-12 DIAGNOSIS — Z961 Presence of intraocular lens: Secondary | ICD-10-CM | POA: Diagnosis not present

## 2019-10-19 DIAGNOSIS — H60312 Diffuse otitis externa, left ear: Secondary | ICD-10-CM | POA: Diagnosis not present

## 2019-11-03 ENCOUNTER — Other Ambulatory Visit: Payer: Self-pay | Admitting: Cardiovascular Disease

## 2019-11-04 DIAGNOSIS — H60392 Other infective otitis externa, left ear: Secondary | ICD-10-CM | POA: Diagnosis not present

## 2019-11-16 DIAGNOSIS — H43821 Vitreomacular adhesion, right eye: Secondary | ICD-10-CM | POA: Diagnosis not present

## 2019-11-16 DIAGNOSIS — H353212 Exudative age-related macular degeneration, right eye, with inactive choroidal neovascularization: Secondary | ICD-10-CM | POA: Diagnosis not present

## 2019-11-16 DIAGNOSIS — H3561 Retinal hemorrhage, right eye: Secondary | ICD-10-CM | POA: Diagnosis not present

## 2019-11-16 DIAGNOSIS — H353112 Nonexudative age-related macular degeneration, right eye, intermediate dry stage: Secondary | ICD-10-CM | POA: Diagnosis not present

## 2019-11-19 DIAGNOSIS — H7192 Unspecified cholesteatoma, left ear: Secondary | ICD-10-CM | POA: Diagnosis not present

## 2019-11-23 DIAGNOSIS — H353121 Nonexudative age-related macular degeneration, left eye, early dry stage: Secondary | ICD-10-CM | POA: Diagnosis not present

## 2019-11-23 DIAGNOSIS — H401121 Primary open-angle glaucoma, left eye, mild stage: Secondary | ICD-10-CM | POA: Diagnosis not present

## 2019-11-23 DIAGNOSIS — H401113 Primary open-angle glaucoma, right eye, severe stage: Secondary | ICD-10-CM | POA: Diagnosis not present

## 2019-11-23 DIAGNOSIS — H353211 Exudative age-related macular degeneration, right eye, with active choroidal neovascularization: Secondary | ICD-10-CM | POA: Diagnosis not present

## 2019-11-23 DIAGNOSIS — D487 Neoplasm of uncertain behavior of other specified sites: Secondary | ICD-10-CM | POA: Diagnosis not present

## 2019-11-23 DIAGNOSIS — Z961 Presence of intraocular lens: Secondary | ICD-10-CM | POA: Diagnosis not present

## 2019-11-30 ENCOUNTER — Other Ambulatory Visit: Payer: Self-pay | Admitting: Cardiovascular Disease

## 2019-12-01 DIAGNOSIS — H7192 Unspecified cholesteatoma, left ear: Secondary | ICD-10-CM | POA: Diagnosis not present

## 2019-12-02 ENCOUNTER — Ambulatory Visit (INDEPENDENT_AMBULATORY_CARE_PROVIDER_SITE_OTHER): Payer: Medicare Other | Admitting: Cardiovascular Disease

## 2019-12-02 ENCOUNTER — Other Ambulatory Visit: Payer: Self-pay

## 2019-12-02 ENCOUNTER — Encounter: Payer: Self-pay | Admitting: Cardiovascular Disease

## 2019-12-02 DIAGNOSIS — Z7901 Long term (current) use of anticoagulants: Secondary | ICD-10-CM

## 2019-12-02 DIAGNOSIS — Z79899 Other long term (current) drug therapy: Secondary | ICD-10-CM | POA: Diagnosis not present

## 2019-12-02 DIAGNOSIS — I951 Orthostatic hypotension: Secondary | ICD-10-CM | POA: Diagnosis not present

## 2019-12-02 DIAGNOSIS — Z950 Presence of cardiac pacemaker: Secondary | ICD-10-CM | POA: Diagnosis not present

## 2019-12-02 DIAGNOSIS — Z23 Encounter for immunization: Secondary | ICD-10-CM | POA: Diagnosis not present

## 2019-12-02 DIAGNOSIS — E785 Hyperlipidemia, unspecified: Secondary | ICD-10-CM | POA: Diagnosis not present

## 2019-12-02 DIAGNOSIS — I4892 Unspecified atrial flutter: Secondary | ICD-10-CM

## 2019-12-02 DIAGNOSIS — Z951 Presence of aortocoronary bypass graft: Secondary | ICD-10-CM

## 2019-12-02 DIAGNOSIS — J439 Emphysema, unspecified: Secondary | ICD-10-CM

## 2019-12-02 DIAGNOSIS — I2510 Atherosclerotic heart disease of native coronary artery without angina pectoris: Secondary | ICD-10-CM | POA: Diagnosis not present

## 2019-12-02 NOTE — Patient Instructions (Addendum)
Medication Instructions:  DECREASE- Digoxin 0.0625 mg(1/2 tablets) by mouth daily  *If you need a refill on your cardiac medications before your next appointment, please call your pharmacy*  Lab Work: Fasting Lipid, CBC, TSH, CMP  If you have labs (blood work) drawn today and your tests are completely normal, you will receive your results only by: Marland Kitchen MyChart Message (if you have MyChart) OR . A paper copy in the mail If you have any lab test that is abnormal or we need to change your treatment, we will call you to review the results.  Testing/Procedures: None Ordered  Follow-Up: At [facility], you and your health needs are our priority.  As part of our continuing mission to provide you with exceptional heart care, we have created designated Provider Care Teams.  These Care Teams include your primary Cardiologist (physician) and Advanced Practice Providers (APPs -  Physician Assistants and Nurse Practitioners) who all work together to provide you with the care you need, when you need it.  Your next appointment:   6 month(s)  The format for your next appointment:   In Person  Provider:   Shelva Majestic, MD
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
## 2019-12-03 ENCOUNTER — Other Ambulatory Visit: Payer: Medicare Other

## 2019-12-03 ENCOUNTER — Ambulatory Visit: Payer: Medicare Other | Admitting: Hematology & Oncology

## 2019-12-04 ENCOUNTER — Other Ambulatory Visit: Payer: Self-pay | Admitting: Cardiovascular Disease

## 2019-12-04 ENCOUNTER — Encounter: Payer: Self-pay | Admitting: Cardiovascular Disease

## 2019-12-06 ENCOUNTER — Other Ambulatory Visit: Payer: Self-pay | Admitting: Cardiovascular Disease

## 2019-12-10 ENCOUNTER — Telehealth: Payer: Self-pay | Admitting: Emergency Medicine

## 2019-12-10 NOTE — Telephone Encounter (Signed)
Called [place] and someone answered the phone. There was a lot of background noise but no one said anything. Will go ahead and fax the OV notes to [facility] as requested.

## 2019-12-15 ENCOUNTER — Ambulatory Visit (INDEPENDENT_AMBULATORY_CARE_PROVIDER_SITE_OTHER): Payer: Medicare Other | Admitting: *Deleted

## 2019-12-15 DIAGNOSIS — I4430 Unspecified atrioventricular block: Secondary | ICD-10-CM | POA: Diagnosis not present

## 2019-12-16 LAB — CUP PACEART REMOTE DEVICE CHECK
Battery Impedance: 353 Ohm
Battery Remaining Longevity: 109 mo
Battery Voltage: 2.79 V
Brady Statistic AP VP Percent: 0 %
Brady Statistic AP VS Percent: 21 %
Brady Statistic AS VP Percent: 1 %
Brady Statistic AS VS Percent: 78 %
Date Time Interrogation Session: 20201217104918
Implantable Lead Implant Date: 20150206
Implantable Lead Implant Date: 20150206
Implantable Lead Location: 753859
Implantable Lead Location: 753860
Implantable Lead Model: 5076
Implantable Lead Model: 5076
Implantable Pulse Generator Implant Date: 20150206
Lead Channel Impedance Value: 444 Ohm
Lead Channel Impedance Value: 534 Ohm
Lead Channel Pacing Threshold Amplitude: 0.375 V
Lead Channel Pacing Threshold Amplitude: 0.75 V
Lead Channel Pacing Threshold Pulse Width: 0.4 ms
Lead Channel Pacing Threshold Pulse Width: 0.4 ms
Lead Channel Setting Pacing Amplitude: 2 V
Lead Channel Setting Pacing Amplitude: 2.5 V
Lead Channel Setting Pacing Pulse Width: 0.4 ms
Lead Channel Setting Sensing Sensitivity: 4 mV

## 2019-12-28 ENCOUNTER — Other Ambulatory Visit: Payer: Self-pay | Admitting: Cardiovascular Disease

## 2019-12-30 ENCOUNTER — Inpatient Hospital Stay: Payer: Medicare Other | Attending: Hematology & Oncology

## 2019-12-30 ENCOUNTER — Inpatient Hospital Stay (HOSPITAL_BASED_OUTPATIENT_CLINIC_OR_DEPARTMENT_OTHER): Payer: Medicare Other | Admitting: Hematology & Oncology

## 2019-12-30 ENCOUNTER — Other Ambulatory Visit: Payer: Self-pay

## 2019-12-30 ENCOUNTER — Encounter: Payer: Self-pay | Admitting: Hematology & Oncology

## 2019-12-30 VITALS — BP 135/74 | HR 84 | Temp 97.5°F | Resp 18 | Wt 175.0 lb

## 2019-12-30 DIAGNOSIS — D509 Iron deficiency anemia, unspecified: Secondary | ICD-10-CM | POA: Diagnosis not present

## 2019-12-30 DIAGNOSIS — D472 Monoclonal gammopathy: Secondary | ICD-10-CM | POA: Diagnosis present

## 2019-12-30 DIAGNOSIS — D500 Iron deficiency anemia secondary to blood loss (chronic): Secondary | ICD-10-CM | POA: Diagnosis not present

## 2019-12-30 DIAGNOSIS — D631 Anemia in chronic kidney disease: Secondary | ICD-10-CM | POA: Diagnosis not present

## 2019-12-30 DIAGNOSIS — I2510 Atherosclerotic heart disease of native coronary artery without angina pectoris: Secondary | ICD-10-CM

## 2019-12-30 DIAGNOSIS — N189 Chronic kidney disease, unspecified: Secondary | ICD-10-CM | POA: Diagnosis not present

## 2019-12-30 DIAGNOSIS — Z79899 Other long term (current) drug therapy: Secondary | ICD-10-CM | POA: Diagnosis not present

## 2019-12-30 LAB — CMP (CANCER CENTER ONLY)
ALT: 40 U/L (ref 0–44)
AST: 31 U/L (ref 15–41)
Albumin: 4.4 g/dL (ref 3.5–5.0)
Alkaline Phosphatase: 100 U/L (ref 38–126)
Anion gap: 9 (ref 5–15)
BUN: 29 mg/dL — ABNORMAL HIGH (ref 8–23)
CO2: 29 mmol/L (ref 22–32)
Calcium: 10.3 mg/dL (ref 8.9–10.3)
Chloride: 101 mmol/L (ref 98–111)
Creatinine: 1.64 mg/dL — ABNORMAL HIGH (ref 0.61–1.24)
GFR, Est AFR Am: 43 mL/min — ABNORMAL LOW (ref >60)
GFR, Estimated: 37 mL/min — ABNORMAL LOW (ref >60)
Glucose, Bld: 115 mg/dL — ABNORMAL HIGH (ref 70–99)
Potassium: 4.4 mmol/L (ref 3.5–5.1)
Sodium: 139 mmol/L (ref 135–145)
Total Bilirubin: 1 mg/dL (ref 0.3–1.2)
Total Protein: 7.7 g/dL (ref 6.5–8.1)

## 2019-12-30 LAB — FERRITIN: Ferritin: 965 ng/mL — ABNORMAL HIGH (ref 24–336)

## 2019-12-30 LAB — CBC WITH DIFFERENTIAL (CANCER CENTER ONLY)
Abs Immature Granulocytes: 0.04 10*3/uL (ref 0.00–0.07)
Basophils Absolute: 0 10*3/uL (ref 0.0–0.1)
Basophils Relative: 1 %
Eosinophils Absolute: 0.1 10*3/uL (ref 0.0–0.5)
Eosinophils Relative: 2 %
HCT: 35.9 % — ABNORMAL LOW (ref 39.0–52.0)
Hemoglobin: 11.6 g/dL — ABNORMAL LOW (ref 13.0–17.0)
Immature Granulocytes: 1 %
Lymphocytes Relative: 25 %
Lymphs Abs: 1.5 10*3/uL (ref 0.7–4.0)
MCH: 33 pg (ref 26.0–34.0)
MCHC: 32.3 g/dL (ref 30.0–36.0)
MCV: 102.3 fL — ABNORMAL HIGH (ref 80.0–100.0)
Monocytes Absolute: 0.6 10*3/uL (ref 0.1–1.0)
Monocytes Relative: 10 %
Neutro Abs: 3.8 10*3/uL (ref 1.7–7.7)
Neutrophils Relative %: 61 %
Platelet Count: 178 10*3/uL (ref 150–400)
RBC: 3.51 MIL/uL — ABNORMAL LOW (ref 4.22–5.81)
RDW: 13.2 % (ref 11.5–15.5)
WBC Count: 6 10*3/uL (ref 4.0–10.5)
nRBC: 0 % (ref 0.0–0.2)

## 2019-12-30 LAB — IRON AND TIBC
Iron: 61 ug/dL (ref 42–163)
Saturation Ratios: 23 % (ref 20–55)
TIBC: 265 ug/dL (ref 202–409)
UIBC: 204 ug/dL (ref 117–376)

## 2019-12-30 LAB — RETICULOCYTES
Immature Retic Fract: 21.4 % — ABNORMAL HIGH (ref 2.3–15.9)
RBC.: 3.51 MIL/uL — ABNORMAL LOW (ref 4.22–5.81)
Retic Count, Absolute: 62.8 10*3/uL (ref 19.0–186.0)
Retic Ct Pct: 1.8 % (ref 0.4–3.1)

## 2019-12-30 NOTE — Progress Notes (Signed)
Hematology and Oncology Follow Up Visit  [name] [id number] 05-05-1929 85 y.o. 12/30/2019    Principle Diagnosis:  1. 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 Anemia secondary to chronic renal insufficiency 3. Intermittent iron - deficiency anemia  Current Therapy:   IV iron as indicated - last received in December 2017   Interim History: [name] is here today with his daughter for follow-up.  We see him every 6 months.  As always, he has a lot of jokes to tell me.  He is doing a good job in avoiding the coronavirus.  I must say that is very impressive with how well he is done trying to avoid the coronavirus.  He really has not been able to go anywhere.  There has been no problems with pain.  He has had no problems with nausea or vomiting.  He has had no bleeding.  When we last saw him back in June, his iron studies showed a ferritin of 700 with an iron saturation of 32%.  He does have the MGUS.  His last monoclonal spike was 0.6 g/dL.  His IgG level was 1120 mg/dL.  The lambda light chain was 11.8 mg/dL.  His appetite has been good.  He did have a nice Thanksgiving and a nice Christmas.  Overall, his performance status is ECOG 2.   Medications:  Allergies as of 12/30/2019   No Known Allergies     Medication List       Accurate as of December 30, 2019  1:26 PM. If you have any questions, ask your nurse or doctor.        atorvastatin 40 MG tablet Commonly known as: LIPITOR TAKE 1 TABLET (40 MG TOTAL) BY MOUTH DAILY. KEEP OV.   Citrucel 500 MG Tabs Generic drug: Methylcellulose (Laxative) Take 1,000 mg by mouth at bedtime.   [employer] SENIOR PROBIOTIC PO Take 1 capsule by mouth at bedtime. Once daily   digoxin 0.125 MG tablet Commonly known as: LANOXIN TAKE 1 TABLET (125 MCG TOTAL) BY MOUTH DAILY. KEEP OV.   diphenhydramine-acetaminophen 25-500 MG Tabs tablet Commonly known as: TYLENOL PM Take 2 tablets by mouth at  bedtime.   Eliquis 2.5 MG Tabs tablet Generic drug: apixaban TAKE 1 TABLET BY MOUTH TWO  TIMES DAILY   Ensure Take 1 Can by mouth daily.   furosemide 40 MG tablet Commonly known as: LASIX Take 1.5 tablets (60 mg total) by mouth daily. KEEP OV.   metoprolol tartrate 50 MG tablet Commonly known as: LOPRESSOR Take 1.5 tablets (75 mg total) by mouth 2 (two) times daily. KEEP OV.   midodrine 2.5 MG tablet Commonly known as: PROAMATINE Take 1 tablet (2.5 mg total) by mouth 2 (two) times daily with a meal.   niacin 500 MG CR tablet Commonly known as: NIASPAN TAKE 1 TABLET BY MOUTH AT BEDTIME. MUST MAKE OFFICE APPOINTMENT   ProAir HFA 108 (90 Base) MCG/ACT inhaler Generic drug: albuterol INHALE 2 PUFFS INTO THE  LUNGS EVERY 4 HOURS AS  NEEDED FOR WHEEZING OR  SHORTNESS OF BREATH   Revefenacin 175 MCG/3ML Soln Commonly known as: Yupelri Inhale 1 vial into the lungs daily.   Simbrinza 1-0.2 % Susp Generic drug: Brinzolamide-Brimonidine Apply 1 drop to eye 2 (two) times daily.   tamsulosin 0.4 MG Caps capsule Commonly known as: FLOMAX Take 0.4 mg by mouth at bedtime.       Allergies: No Known Allergies  Past Medical History, Surgical history, Social history, and Family  History were reviewed and updated.  Review of Systems: Review of Systems  Constitutional: Negative.   HENT: Negative.   Eyes: Negative.   Respiratory: Negative.   Cardiovascular: Negative.   Gastrointestinal: Negative.   Genitourinary: Negative.   Musculoskeletal: Negative.   Skin: Negative.   Neurological: Negative.   Endo/Heme/Allergies: Negative.   Psychiatric/Behavioral: Negative.       Physical Exam:  weight is 175 lb (79.4 kg). His oral temperature is 97.5 F (36.4 C) (abnormal). His blood pressure is 135/74 and his pulse is 84. His respiration is 18 and oxygen saturation is 97%.   Wt Readings from Last 3 Encounters:  12/30/19 175 lb (79.4 kg)  12/02/19 177 lb (80.3 kg)  07/01/19 180  lb 3.2 oz (81.7 kg)    Physical Exam Vitals reviewed.  HENT:     Head: Normocephalic and atraumatic.  Eyes:     Pupils: Pupils are equal, round, and reactive to light.  Cardiovascular:     Rate and Rhythm: Normal rate and regular rhythm.     Heart sounds: Normal heart sounds.  Pulmonary:     Effort: Pulmonary effort is normal.     Breath sounds: Normal breath sounds.  Abdominal:     General: Bowel sounds are normal.     Palpations: Abdomen is soft.  Musculoskeletal:        General: No tenderness or deformity. Normal range of motion.     Cervical back: Normal range of motion.  Lymphadenopathy:     Cervical: No cervical adenopathy.  Skin:    General: Skin is warm and dry.     Findings: No erythema or rash.  Neurological:     Mental Status: He is alert and oriented to person, place, and time.  Psychiatric:        Behavior: Behavior normal.        Thought Content: Thought content normal.        Judgment: Judgment normal.      Lab Results  Component Value Date   WBC 6.0 12/30/2019   HGB 11.6 (L) 12/30/2019   HCT 35.9 (L) 12/30/2019   MCV 102.3 (H) 12/30/2019   PLT 178 12/30/2019   Lab Results  Component Value Date   FERRITIN 700 (H) 12/17/2018   IRON 87 12/17/2018   TIBC 268 12/17/2018   UIBC 181 12/17/2018   IRONPCTSAT 32 12/17/2018   Lab Results  Component Value Date   RETICCTPCT 1.8 12/30/2019   RBC 3.51 (L) 12/30/2019   RETICCTABS 33.2 11/09/2015   Lab Results  Component Value Date   KPAFRELGTCHN 50.2 (H) 06/17/2019   LAMBDASER 117.9 (H) 06/17/2019   KAPLAMBRATIO 0.43 06/17/2019   Lab Results  Component Value Date   IGGSERUM 1,110 06/17/2019   IGGSERUM 1,127 06/17/2019   IGA 129 06/17/2019   IGA 128 06/17/2019   IGMSERUM 422 (H) 06/17/2019   IGMSERUM 440 (H) 06/17/2019   Lab Results  Component Value Date   TOTALPROTELP 7.1 06/17/2019   ALBUMINELP 3.4 06/17/2019   A1GS 0.2 06/17/2019   A2GS 1.0 06/17/2019   BETS 1.1 06/17/2019   BETA2SER  0.4 11/09/2015   GAMS 1.4 06/17/2019   MSPIKE 0.6 (H) 06/17/2019   SPEI Comment 08/13/2018     Chemistry      Component Value Date/Time   NA 138 06/17/2019 1437   NA 146 (H) 12/11/2017 1322   NA 141 06/05/2017 1051   K 4.5 06/17/2019 1437   K 4.5 12/11/2017 1322   K 3.9  06/05/2017 1051   CL 98 06/17/2019 1437   CL 102 12/11/2017 1322   CO2 30 06/17/2019 1437   CO2 31 12/11/2017 1322   CO2 25 06/05/2017 1051   BUN 27 (H) 06/17/2019 1437   BUN 21 12/11/2017 1322   BUN 15.6 06/05/2017 1051   CREATININE 1.75 (H) 06/17/2019 1437   CREATININE 1.4 (H) 12/11/2017 1322   CREATININE 1.3 06/05/2017 1051      Component Value Date/Time   CALCIUM 9.9 06/17/2019 1437   CALCIUM 9.6 12/11/2017 1322   CALCIUM 9.6 06/05/2017 1051   ALKPHOS 76 06/17/2019 1437   ALKPHOS 74 12/11/2017 1322   ALKPHOS 77 06/05/2017 1051   AST 34 06/17/2019 1437   AST 33 06/05/2017 1051   ALT 38 06/17/2019 1437   ALT 43 12/11/2017 1322   ALT 32 06/05/2017 1051   BILITOT 1.0 06/17/2019 1437   BILITOT 0.90 06/05/2017 1051      Impression and Plan: Ms. Prouty is a very pleasant 85 yo caucasian gentleman with history of anemia of iron deficiency and chronic renal insufficiency.   It is amazing that he is done so well.  I would be surprised if his iron is on the low side.  We will have him come back in 6 more months.    The MGUS in my opinion, will never be a problem from my perspective.    [name], MD 12/31/20201:26 PM

## 2019-12-31 LAB — IGG, IGA, IGM
IgA: 128 mg/dL (ref 61–437)
IgG (Immunoglobin G), Serum: 1085 mg/dL (ref 603–1613)
IgM (Immunoglobulin M), Srm: 368 mg/dL — ABNORMAL HIGH (ref 15–143)

## 2020-01-03 ENCOUNTER — Telehealth: Payer: Self-pay | Admitting: *Deleted

## 2020-01-03 LAB — KAPPA/LAMBDA LIGHT CHAINS
Kappa free light chain: 46.5 mg/L — ABNORMAL HIGH (ref 3.3–19.4)
Kappa, lambda light chain ratio: 0.38 (ref 0.26–1.65)
Lambda free light chains: 120.9 mg/L — ABNORMAL HIGH (ref 5.7–26.3)

## 2020-01-03 NOTE — Telephone Encounter (Signed)
As noted below by Dr. Ennever, I informed the patient that the iron level is OK. He verbalized understanding. 

## 2020-01-03 NOTE — Telephone Encounter (Signed)
-----   Message from [name], MD sent at 12/30/2019  4:32 PM EST ----- Call - the iron level is ok!!  [name]

## 2020-01-04 ENCOUNTER — Other Ambulatory Visit: Payer: Self-pay | Admitting: Cardiovascular Disease

## 2020-01-05 LAB — IMMUNOFIXATION REFLEX, SERUM
IgA: 131 mg/dL (ref 61–437)
IgG (Immunoglobin G), Serum: 1060 mg/dL (ref 603–1613)
IgM (Immunoglobulin M), Srm: 387 mg/dL — ABNORMAL HIGH (ref 15–143)

## 2020-01-05 LAB — PROTEIN ELECTROPHORESIS, SERUM, WITH REFLEX
A/G Ratio: 1 (ref 0.7–1.7)
Albumin ELP: 3.6 g/dL (ref 2.9–4.4)
Alpha-1-Globulin: 0.3 g/dL (ref 0.0–0.4)
Alpha-2-Globulin: 0.9 g/dL (ref 0.4–1.0)
Beta Globulin: 1.1 g/dL (ref 0.7–1.3)
Gamma Globulin: 1.2 g/dL (ref 0.4–1.8)
Globulin, Total: 3.5 g/dL (ref 2.2–3.9)
M-Spike, %: 0.5 g/dL — ABNORMAL HIGH
SPEP Interpretation: 0
Total Protein ELP: 7.1 g/dL (ref 6.0–8.5)

## 2020-01-11 ENCOUNTER — Other Ambulatory Visit: Payer: Self-pay | Admitting: Cardiovascular Disease

## 2020-01-12 DIAGNOSIS — H7192 Unspecified cholesteatoma, left ear: Secondary | ICD-10-CM | POA: Diagnosis not present

## 2020-01-17 ENCOUNTER — Other Ambulatory Visit: Payer: Self-pay | Admitting: Cardiovascular Disease

## 2020-01-21 DIAGNOSIS — H18592 Other hereditary corneal dystrophies, left eye: Secondary | ICD-10-CM | POA: Diagnosis not present

## 2020-01-21 DIAGNOSIS — H353121 Nonexudative age-related macular degeneration, left eye, early dry stage: Secondary | ICD-10-CM | POA: Diagnosis not present

## 2020-01-21 DIAGNOSIS — H401113 Primary open-angle glaucoma, right eye, severe stage: Secondary | ICD-10-CM | POA: Diagnosis not present

## 2020-01-21 DIAGNOSIS — D487 Neoplasm of uncertain behavior of other specified sites: Secondary | ICD-10-CM | POA: Diagnosis not present

## 2020-01-21 DIAGNOSIS — H353211 Exudative age-related macular degeneration, right eye, with active choroidal neovascularization: Secondary | ICD-10-CM | POA: Diagnosis not present

## 2020-01-21 DIAGNOSIS — Z961 Presence of intraocular lens: Secondary | ICD-10-CM | POA: Diagnosis not present

## 2020-01-21 DIAGNOSIS — H401121 Primary open-angle glaucoma, left eye, mild stage: Secondary | ICD-10-CM | POA: Diagnosis not present

## 2020-01-27 DIAGNOSIS — H7102 Cholesteatoma of attic, left ear: Secondary | ICD-10-CM | POA: Diagnosis not present

## 2020-02-01 DIAGNOSIS — H353212 Exudative age-related macular degeneration, right eye, with inactive choroidal neovascularization: Secondary | ICD-10-CM | POA: Diagnosis not present

## 2020-02-01 DIAGNOSIS — H43821 Vitreomacular adhesion, right eye: Secondary | ICD-10-CM | POA: Diagnosis not present

## 2020-02-01 DIAGNOSIS — H353211 Exudative age-related macular degeneration, right eye, with active choroidal neovascularization: Secondary | ICD-10-CM | POA: Diagnosis not present

## 2020-02-01 DIAGNOSIS — H3561 Retinal hemorrhage, right eye: Secondary | ICD-10-CM | POA: Diagnosis not present

## 2020-02-09 ENCOUNTER — Other Ambulatory Visit: Payer: Self-pay | Admitting: Cardiovascular Disease

## 2020-02-09 DIAGNOSIS — H7192 Unspecified cholesteatoma, left ear: Secondary | ICD-10-CM | POA: Diagnosis not present

## 2020-02-15 ENCOUNTER — Encounter: Payer: Self-pay | Admitting: Cardiovascular Disease

## 2020-02-15 ENCOUNTER — Ambulatory Visit (INDEPENDENT_AMBULATORY_CARE_PROVIDER_SITE_OTHER): Payer: Medicare Other | Admitting: Cardiovascular Disease

## 2020-02-15 ENCOUNTER — Other Ambulatory Visit: Payer: Self-pay

## 2020-02-15 VITALS — BP 142/64 | HR 75 | Ht 70.0 in | Wt 173.4 lb

## 2020-02-15 DIAGNOSIS — Z950 Presence of cardiac pacemaker: Secondary | ICD-10-CM

## 2020-02-15 DIAGNOSIS — I2510 Atherosclerotic heart disease of native coronary artery without angina pectoris: Secondary | ICD-10-CM

## 2020-02-15 DIAGNOSIS — I480 Paroxysmal atrial fibrillation: Secondary | ICD-10-CM | POA: Diagnosis not present

## 2020-02-15 DIAGNOSIS — J439 Emphysema, unspecified: Secondary | ICD-10-CM

## 2020-02-15 DIAGNOSIS — I951 Orthostatic hypotension: Secondary | ICD-10-CM | POA: Diagnosis not present

## 2020-02-15 DIAGNOSIS — Z7901 Long term (current) use of anticoagulants: Secondary | ICD-10-CM | POA: Diagnosis not present

## 2020-02-15 DIAGNOSIS — I4430 Unspecified atrioventricular block: Secondary | ICD-10-CM

## 2020-02-15 DIAGNOSIS — I5032 Chronic diastolic (congestive) heart failure: Secondary | ICD-10-CM | POA: Diagnosis not present

## 2020-02-15 NOTE — Patient Instructions (Signed)
Medication Instructions:  No changes *If you need a refill on your cardiac medications before your next appointment, please call your pharmacy*  Lab Work: None ordered If you have labs (blood work) drawn today and your tests are completely normal, you will receive your results only by: . MyChart Message (if you have MyChart) OR . A paper copy in the mail If you have any lab test that is abnormal or we need to change your treatment, we will call you to review the results.  Testing/Procedures: None ordered  Follow-Up: At [facility], you and your health needs are our priority.  As part of our continuing mission to provide you with exceptional heart care, we have created designated Provider Care Teams.  These Care Teams include your primary Cardiologist (physician) and Advanced Practice Providers (APPs -  Physician Assistants and Nurse Practitioners) who all work together to provide you with the care you need, when you need it.  Your next appointment:   12 month(s)  The format for your next appointment:   Either In Person or Virtual  Provider:   You may see Mihai Croitoru, MD or one of the following Advanced Practice Providers on your designated Care Team:    Hao Meng, PA-C  Angela Duke, PA-C or   Krista Kroeger, PA-C   

## 2020-02-15 NOTE — Progress Notes (Signed)
Patient ID: [name], male   DOB: December 18, 1929, 86 y.o.   MRN: [id number]    Cardiology Office Note    Date:  02/17/2020   ID:  [name], DOB 1929-09-30, MRN [id number]  PCP:  Lance Bender, PA-C  Cardiologist: Lance Majestic, MD;  Lance Klein, MD   Chief complaint: Pacemaker check   History of Present Illness:  [name] is a 86 y.o. 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  We lost track of him for remote downloads for a few months, since his transmitter was no longer working.  He has a new transmitter now and has resumed remote downloads every 3 months.  He last saw Dr. Claiborne Billings in clinic a couple of months ago.  He is accompanied by his daughter.  [name]'s memory is clearly deteriorating and he is more hard of hearing, but he remains very funny and high spirited.  Device interrogation shows normal function.  His Medtronic Adapta device still has approximately 9 years of longevity.  He has 22% atrial pacing and only 1% ventricular pacing and lead parameters remain excellent.  Atrial fibrillation burden has been on the average 13% in the last 3 years, but has only been about 7% in the last 6 months (he had a week of persistent atrial fibrillation in January 2021).  Rate control is adequate, but he does have occasional atrial tachycardia with 1: 1 AV conduction.  He has not had syncope, falls, serious injuries or bleeding problems.  He does bleed for a long time if he accidentally cuts himself.  The current dose of midodrine is doing a good job of preventing dizziness and syncope.  Because of his tendency to have orthostatic hypotension, her dose of metoprolol has been limited.  He is also on digoxin.  He denies dyspnea or angina but is quite sedentary.   He has not had problems with edema, orthopnea, PND or focal neurological events.  Last saw Dr. Claiborne Billings in the office in September.  He has not had any serious cardiac problems since that time.  Generally feels well denies problems with dyspnea, angina, palpitations, syncope, falls or dizziness.  His weight at home has been stable in the 178/180 pound range and he has not needed to adjust his dose of diuretics in the last 3 weeks he has not had any bleeding problems.  Pacemaker interrogation shows normal device function. The device generator has approximately 10.5 years of estimated remaining longevity. There is 12 % atrial pacing and 1% ventricular pacing. The overall burden of atrial mode switch has been approximately 30 %.  Most of this appears to be paroxysmal atrial tachycardia in the 100-110 bpm, with only 6 episodes of atrial fibrillation/flutter since June.  These lasted between 2 and 8 hours ventricular rate control was mediocre during atrial fibrillation.   Rate control medication use has been limited by orthostatic hypotension for which he takes Midodrine.  He is on metoprolol 75 mg twice daily and digoxin 0.125 mg daily  His electrocardiogram shows atrial paced, ventricular sensed rhythm with long AV delay of 264 ms but is otherwise normal.  QTc 417 ms.   Past Medical History:  Diagnosis Date  . ACS (acute coronary syndrome) ([place])   . Acute respiratory failure with hypoxia ([place]) 05/2017  . Anemia of renal disease 12/05/2011  .  Anemia, iron deficiency 12/05/2011  . Asthma   . CAS (cerebral atherosclerosis)    Carotid Dopplers, 04/09/2012 - Bilateral Bulb/Proximal ICAs-mild to moderate amount of fibrous plaque of fibrous soft plaque w/o evidence of a significant diameter reduction, dissection, or any other vascular abnormality  . COPD (chronic obstructive pulmonary disease) ([employer])   . Coronary heart disease    a. s/p CABG in 2006 with LIMA-LAD, SVG-Cx, SVG-IM-OM b. low-risk NST in 08/2016    . Dysrhythmia 11/09/2013   SVT VS A FLUTTER   . Kidney stones   . MGUS (monoclonal gammopathy of unknown significance) 12/05/2011  . Orthostatic hypotension 09/10/2016  . PAF (paroxysmal atrial fibrillation) (HCC)    a. on Eliquis  . Shortness of breath   . SSS (sick sinus syndrome) ([place])    a. s/p PPM placement in 2015    Past Surgical History:  Procedure Laterality Date  . CARDIAC CATHETERIZATION  12/05/2005   Recommended CABG  . CARDIOVERSION N/A 11/09/2013   Procedure: CARDIOVERSION;  Surgeon: Pixie Casino, MD;  Location: [facility] ENDOSCOPY;  Service: Cardiovascular;  Laterality: N/A;  . CATARACT EXTRACTION    . CORONARY ARTERY BYPASS GRAFT  12/06/2005   x4, LIMA to LAD, vein to distal circumflex, sequential vein to intermediate and obtuse marginal vessel  . INNER EAR SURGERY    . KIDNEY STONE SURGERY    . LOOP RECORDER IMPLANT N/A 11/30/2013   Procedure: LOOP RECORDER IMPLANT;  Surgeon: Lance Klein, MD;  Location: [facility] CATH LAB;  Service: Cardiovascular;  Laterality: N/A;  . PERMANENT PACEMAKER INSERTION N/A 02/04/2014   Procedure: PERMANENT PACEMAKER INSERTION;  Surgeon: Lance Klein, MD;  Location: [facility] CATH LAB;  Service: Cardiovascular;  Laterality: N/A;  . TEE WITHOUT CARDIOVERSION N/A 11/09/2013   Procedure: TRANSESOPHAGEAL ECHOCARDIOGRAM (TEE);  Surgeon: Pixie Casino, MD;  Location: [facility] ENDOSCOPY;  Service: Cardiovascular;  Laterality: N/A;    Outpatient Medications Prior to Visit  Medication Sig Dispense Refill  . atorvastatin (LIPITOR) 40 MG tablet TAKE 1 TABLET (40 MG TOTAL) BY MOUTH DAILY. KEEP OV. 90 tablet 3  . cycloSPORINE (RESTASIS) 0.05 % ophthalmic emulsion Place 1 drop into both eyes in the morning and at bedtime.    . digoxin (LANOXIN) 0.125 MG tablet Take 0.0625 mg by mouth daily.    . diphenhydramine-acetaminophen (TYLENOL PM) 25-500 MG TABS Take 2 tablets by mouth at bedtime.     Marland Kitchen ELIQUIS 2.5 MG TABS tablet TAKE 1 TABLET BY MOUTH TWO  TIMES DAILY 180  tablet 1  . ENSURE (ENSURE) Take 1 Can by mouth daily.    . furosemide (LASIX) 40 MG tablet TAKE 1.5 TABLETS (60 MG TOTAL) BY MOUTH DAILY. KEEP OV. 135 tablet 0  . Methylcellulose, Laxative, (CITRUCEL) 500 MG TABS Take 1,000 mg by mouth at bedtime.    . metoprolol tartrate (LOPRESSOR) 50 MG tablet Take 1.5 tablets (75 mg total) by mouth 2 (two) times daily. 270 tablet 1  . midodrine (PROAMATINE) 2.5 MG tablet Take 1 tablet (2.5 mg total) by mouth 2 (two) times daily with a meal. 180 tablet 3  . niacin (NIASPAN) 500 MG CR tablet TAKE 1 TABLET BY MOUTH AT BEDTIME. MUST MAKE OFFICE APPOINTMENT 30 tablet 1  . PROAIR HFA 108 (90 Base) MCG/ACT inhaler INHALE 2 PUFFS INTO THE  LUNGS EVERY 4 HOURS AS  NEEDED FOR WHEEZING OR  SHORTNESS OF BREATH 51 g 1  . Probiotic Product ([employer]S SENIOR PROBIOTIC PO) Take 1 capsule by mouth at bedtime. Once daily    .  Revefenacin (YUPELRI) 175 MCG/3ML SOLN Inhale 1 vial into the lungs daily. 4 vial 0  . SIMBRINZA 1-0.2 % SUSP Apply 1 drop to eye 2 (two) times daily.    . Tamsulosin HCl (FLOMAX) 0.4 MG CAPS Take 0.4 mg by mouth at bedtime.     . digoxin (LANOXIN) 0.125 MG tablet TAKE 1 TABLET (125 MCG TOTAL) BY MOUTH DAILY. KEEP OV. 90 tablet 3   No facility-administered medications prior to visit.     Allergies:   Patient has no known allergies.   Social History   Socioeconomic History  . Marital status: Married    Spouse name: Not on file  . Number of children: Not on file  . Years of education: Not on file  . Highest education level: Not on file  Occupational History  . Occupation: retired Arts administrator  . Occupation: maintenence  Tobacco Use  . Smoking status: Former Smoker    Packs/day: 1.00    Years: 47.00    Pack years: 47.00    Types: Cigarettes    Start date: 12/02/1951    Quit date: 12/30/1993    Years since quitting: 26.1  . Smokeless tobacco: Never Used  . Tobacco comment: quit 20 years ago  Substance and Sexual Activity  . Alcohol  use: No    Alcohol/week: 0.0 standard drinks  . Drug use: No  . Sexual activity: Not on file  Other Topics Concern  . Not on file  Social History Narrative   Pt lives with daughter.    Social Determinants of Health   Financial Resource Strain:   . Difficulty of Paying Living Expenses: Not on file  Food Insecurity:   . Worried About Charity fundraiser in the Last Year: Not on file  . Ran Out of Food in the Last Year: Not on file  Transportation Needs:   . Lack of Transportation (Medical): Not on file  . Lack of Transportation (Non-Medical): Not on file  Physical Activity:   . Days of Exercise per Week: Not on file  . Minutes of Exercise per Session: Not on file  Stress:   . Feeling of Stress : Not on file  Social Connections:   . Frequency of Communication with Friends and Family: Not on file  . Frequency of Social Gatherings with Friends and Family: Not on file  . Attends Religious Services: Not on file  . Active Member of Clubs or Organizations: Not on file  . Attends Archivist Meetings: Not on file  . Marital Status: Not on file     Family History:  The patient's family history includes Emphysema in his daughter; Heart attack in his father and mother; Heart disease in his brother and mother; Tuberculosis in his father.   ROS:   Please see the history of present illness.    ROS All other systems reviewed and are negative.   PHYSICAL EXAM:   VS:  BP (!) 142/64   Pulse 75   Ht 5\' 10"  (1.778 m)   Wt 173 lb 6.4 oz (78.7 kg)   BMI 24.88 kg/m      General: Alert, oriented x3, no distress, appears elderly and more frail than in the past.  Healthy left subclavian pacemaker site. Head: no evidence of trauma, PERRL, EOMI, no exophtalmos or lid lag, no myxedema, no xanthelasma; normal ears, nose and oropharynx Neck: normal jugular venous pulsations and no hepatojugular reflux; brisk carotid pulses without delay and no carotid bruits Chest:  clear to auscultation,  no signs of consolidation by percussion or palpation, normal fremitus, symmetrical and full respiratory excursions Cardiovascular: normal position and quality of the apical impulse, regular rhythm, normal first and second heart sounds, no murmurs, rubs or gallops Abdomen: no tenderness or distention, no masses by palpation, no abnormal pulsatility or arterial bruits, normal bowel sounds, no hepatosplenomegaly Extremities: no clubbing, cyanosis or edema; 2+ radial, ulnar and brachial pulses bilaterally; 2+ right femoral, posterior tibial and dorsalis pedis pulses; 2+ left femoral, posterior tibial and dorsalis pedis pulses; no subclavian or femoral bruits Neurological: grossly nonfocal Psych: Normal mood and affect    Wt Readings from Last 3 Encounters:  02/15/20 173 lb 6.4 oz (78.7 kg)  12/30/19 175 lb (79.4 kg)  12/02/19 177 lb (80.3 kg)      Studies/Labs Reviewed:   EKG:  EKG is ordered today.  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 Paroxysmal atrial fibrillation (HCC)   2. Chronic diastolic heart failure ([place])   3. Long term current use of anticoagulant   4. AVB (atrioventricular block)- high grade second degree   5. Pacemaker   6. Coronary artery disease involving native coronary artery of native heart without angina pectoris   7. Pulmonary emphysema, unspecified emphysema type ([place])   8. Orthostatic hypotension      PLAN:  In order of problems listed above:  1. Paroxysmal atypical atrial  flutter/atrial fibrillation: He still has occasional episodes of persistent atrial fibrillation (for example 1 week last January), but these are asymptomatic and well rate controlled.  He continues to have mild coherent episodes of atrial tachycardia/slow atrial flutter with 1: 1 AV conduction and episodes of tachycardia at times.  A little hard to judge the overall burden of atrial arrhythmia since his counters have not been clear since 2017.  Continue metoprolol and at least for the time being also continue digoxin.reevaluate the need to continue digoxin after another few device checks.  He is compliant with anticoagulation. 2. CHF: Preserved left ventricular systolic function.  Stable on unchanged dose of diuretics. 3. Anticoagulation: He has not had recent falls or serious bleeding complications.. CHADSVasc 4 (age 5, CAD, CHF). 4. Second degree AV block: Although this led to pacemaker implantation, happens quite infrequently.  He only has 1% ventricular pacing. 5. PM: Normal device function.  Have reestablished remote downloads every 3 months. 6. CAD s/p CABG: Asymptomatic/angina free. 7. COPD: This is the dominant cause of his exertional dyspnea.  Not complaining much right now since he is quite sedentary. 8. Orthostatic hypotension: Challenging to balance the use of diuretics for heart failure, metoprolol for arrhythmia control and the use of midodrine to prevent dizziness and falls.   Medication Adjustments/Labs and Tests Ordered: Current medicines are reviewed at length with the patient today.  Concerns regarding medicines are outlined above.  Medication changes, Labs and Tests ordered today are listed in the Patient Instructions below. Patient Instructions  Medication Instructions:  No changes *If you need  a refill on your cardiac medications before your next appointment, please call your pharmacy*  Lab Work: None ordered If you have labs (blood work) drawn today and your tests are  completely normal, you will receive your results only by: Marland Kitchen MyChart Message (if you have MyChart) OR . A paper copy in the mail If you have any lab test that is abnormal or we need to change your treatment, we will call you to review the results.  Testing/Procedures: None ordered  Follow-Up: At [facility], you and your health needs are our priority.  As part of our continuing mission to provide you with exceptional heart care, we have created designated Provider Care Teams.  These Care Teams include your primary Cardiologist (physician) and Advanced Practice Providers (APPs -  Physician Assistants and Nurse Practitioners) who all work together to provide you with the care you need, when you need it.  Your next appointment:   12 month(s)  The format for your next appointment:   Either In Person or Virtual  Provider:   You may see Lance Klein, MD or one of the following Advanced Practice Providers on your designated Care Team:    Almyra Deforest, PA-C  Fabian Sharp, [place] or   Roby Lofts, PA-C     Signed, Lance Klein, MD  02/17/2020 2:12 PM    [facility] [address], [place], [place]  [address] Phone: [phone]; Fax: [phone]

## 2020-02-17 ENCOUNTER — Encounter: Payer: Self-pay | Admitting: Cardiovascular Disease

## 2020-02-22 DIAGNOSIS — H401113 Primary open-angle glaucoma, right eye, severe stage: Secondary | ICD-10-CM | POA: Diagnosis not present

## 2020-02-22 DIAGNOSIS — H18592 Other hereditary corneal dystrophies, left eye: Secondary | ICD-10-CM | POA: Diagnosis not present

## 2020-02-22 DIAGNOSIS — H401121 Primary open-angle glaucoma, left eye, mild stage: Secondary | ICD-10-CM | POA: Diagnosis not present

## 2020-02-22 DIAGNOSIS — Z961 Presence of intraocular lens: Secondary | ICD-10-CM | POA: Diagnosis not present

## 2020-02-22 DIAGNOSIS — D487 Neoplasm of uncertain behavior of other specified sites: Secondary | ICD-10-CM | POA: Diagnosis not present

## 2020-02-23 DIAGNOSIS — H90A32 Mixed conductive and sensorineural hearing loss, unilateral, left ear with restricted hearing on the contralateral side: Secondary | ICD-10-CM | POA: Diagnosis not present

## 2020-02-23 DIAGNOSIS — H7192 Unspecified cholesteatoma, left ear: Secondary | ICD-10-CM | POA: Diagnosis not present

## 2020-02-26 ENCOUNTER — Other Ambulatory Visit: Payer: Self-pay

## 2020-03-06 DIAGNOSIS — H353211 Exudative age-related macular degeneration, right eye, with active choroidal neovascularization: Secondary | ICD-10-CM | POA: Diagnosis not present

## 2020-03-06 DIAGNOSIS — H43821 Vitreomacular adhesion, right eye: Secondary | ICD-10-CM | POA: Diagnosis not present

## 2020-03-08 DIAGNOSIS — H938X2 Other specified disorders of left ear: Secondary | ICD-10-CM | POA: Diagnosis not present

## 2020-03-13 ENCOUNTER — Other Ambulatory Visit: Payer: Self-pay | Admitting: Cardiovascular Disease

## 2020-03-15 ENCOUNTER — Ambulatory Visit (INDEPENDENT_AMBULATORY_CARE_PROVIDER_SITE_OTHER): Payer: Medicare Other | Admitting: *Deleted

## 2020-03-15 DIAGNOSIS — I4892 Unspecified atrial flutter: Secondary | ICD-10-CM | POA: Diagnosis not present

## 2020-03-16 ENCOUNTER — Telehealth: Payer: Self-pay | Admitting: Emergency Medicine

## 2020-03-16 LAB — CUP PACEART REMOTE DEVICE CHECK
Battery Impedance: 377 Ohm
Battery Remaining Longevity: 106 mo
Battery Voltage: 2.79 V
Brady Statistic AP VP Percent: 0 %
Brady Statistic AP VS Percent: 18 %
Brady Statistic AS VP Percent: 2 %
Brady Statistic AS VS Percent: 79 %
Date Time Interrogation Session: 20210318112543
Implantable Lead Implant Date: 20150206
Implantable Lead Implant Date: 20150206
Implantable Lead Location: 753859
Implantable Lead Location: 753860
Implantable Lead Model: 5076
Implantable Lead Model: 5076
Implantable Pulse Generator Implant Date: 20150206
Lead Channel Impedance Value: 421 Ohm
Lead Channel Impedance Value: 527 Ohm
Lead Channel Pacing Threshold Amplitude: 0.375 V
Lead Channel Pacing Threshold Amplitude: 0.875 V
Lead Channel Pacing Threshold Pulse Width: 0.4 ms
Lead Channel Pacing Threshold Pulse Width: 0.4 ms
Lead Channel Setting Pacing Amplitude: 2 V
Lead Channel Setting Pacing Amplitude: 2.5 V
Lead Channel Setting Pacing Pulse Width: 0.4 ms
Lead Channel Setting Sensing Sensitivity: 2.8 mV

## 2020-03-16 NOTE — Telephone Encounter (Signed)
The pt daughter [name] was returning a call from [place], [place]. I let her know that Jenny Reichmann did talk with her father. I read her Jenny Reichmann phone note word by word. She states she did not know that her father spoke with the nurse already and was just checking up on him. She thanked me for my help.

## 2020-03-16 NOTE — Telephone Encounter (Signed)
Alert received for ongoing AFL. Patient reports he has had no change in condition. He denies CP, SOB, pedal edema, dizziness, and syncope. He reports no missed doses of medication. + Eliquis . Ed precautions given and note to be forwrded to Dr Recardo Evangelist.

## 2020-03-20 DIAGNOSIS — N400 Enlarged prostate without lower urinary tract symptoms: Secondary | ICD-10-CM | POA: Diagnosis not present

## 2020-03-20 DIAGNOSIS — N200 Calculus of kidney: Secondary | ICD-10-CM | POA: Diagnosis not present

## 2020-03-23 DIAGNOSIS — H9212 Otorrhea, left ear: Secondary | ICD-10-CM | POA: Diagnosis not present

## 2020-04-03 ENCOUNTER — Other Ambulatory Visit: Payer: Self-pay | Admitting: Cardiovascular Disease

## 2020-04-04 DIAGNOSIS — H9212 Otorrhea, left ear: Secondary | ICD-10-CM | POA: Diagnosis not present

## 2020-04-06 ENCOUNTER — Encounter: Payer: Self-pay | Admitting: Emergency Medicine

## 2020-04-06 ENCOUNTER — Ambulatory Visit (INDEPENDENT_AMBULATORY_CARE_PROVIDER_SITE_OTHER): Payer: Medicare Other | Admitting: Emergency Medicine

## 2020-04-06 ENCOUNTER — Other Ambulatory Visit: Payer: Self-pay

## 2020-04-06 DIAGNOSIS — J439 Emphysema, unspecified: Secondary | ICD-10-CM

## 2020-04-06 DIAGNOSIS — I2510 Atherosclerotic heart disease of native coronary artery without angina pectoris: Secondary | ICD-10-CM

## 2020-04-06 MED ORDER — YUPELRI 175 MCG/3ML IN SOLN: 175.0000 ug | Freq: Every day | RESPIRATORY_TRACT | 12 refills | Status: DC

## 2020-04-06 MED ORDER — YUPELRI 175 MCG/3ML IN SOLN: 175.0000 ug | Freq: Every day | RESPIRATORY_TRACT | 12 refills | Status: AC

## 2020-04-06 NOTE — Progress Notes (Signed)
  HPI:   ROV 12/02/18 --pleasant 86 year old gentleman with a history of CAD/CABG (2006, Dr. Claiborne Billings), MGUS, allergic rhinitis with associated cough, COPD with associated chronic hypoxemic respiratory failure. He reports that he is feeling well - his breathing has been stable. He has clearly benefited from the change to [facility] - his exercise tolerance, less wheeze. No cough these days. He goes bowling, goes to the [employer]. Still some question as to whether he delivers the ProAir very effectively. Flu shot is up to date done on 11/15/18. PNA shot is up to date. No flares since last time.   ROV 07/01/2019 --86 year old gentleman who follows up today for his COPD, chronic hypoxemic respiratory failure, chronic cough with allergic rhinitis.  He also has a history of CAD/CABG, MGUS.  We have been managing him on Yupelri, albuterol as needed. He is reliable with his Yulelri. He has been isolated for social distancing, is not doing as much, has maybe lost some of his conditioning. He is using his SABA when he is dyspneic, about 1x a day. No flares since last time. His PNA shots are up to date, had flu shot last year.   ROV 04/06/20 --Joe is a pleasant 86 year old gentleman who follows up today for COPD and chronic hypoxemic respiratory failure.  He also has chronic cough in setting of allergic rhinitis.  His other history is significant for CAD/CABG, MGUS.  We have been managing him on Yupelri once daily. He reports that he has been doing well. No flares reported, no prednisone. No hospitalizations. Denies any wheeze. COVID shots UTD, Flu UTD, PNA shot UTD.                                                              No flowsheet data found.  EXAM :  Vitals:   04/06/20 1427  BP: 118/60  Pulse: 68  Temp: 98.8 F (37.1 C)  TempSrc: Temporal  SpO2: 97%  Weight: 173 lb 9.6 oz (78.7 kg)  Height: 5\' 9"  (1.753 m)   Gen: Pleasant,  in no distress,  normal affect  ENT: No lesions,  mouth clear,   oropharynx clear, no postnasal drip  Neck: No JVD, no stridor  Lungs: No use of accessory muscles, distant, no wheeze or crackles, good air movement  Cardiovascular: RRR, heart sounds normal, no murmur or gallops, no peripheral edema  Musculoskeletal: No deformities, no cyanosis or clubbing  Neuro: alert, poor hearing, no focal deficits  Skin: Warm, no lesions or rashes   COPD (chronic obstructive pulmonary disease) Please continue Yulpelri once daily every day. Keep your albuterol available to use 2 puffs if needed for shortness of breath, chest tightness, wheezing. COVID-19, flu and pneumonia shots are all up-to-date. Follow with Dr Lamonte Sakai in 6 months or sooner if you have any problems   Baltazar Apo, MD, PhD 04/06/2020, 2:58 PM [facility] [phone] or if no answer [phone]

## 2020-04-06 NOTE — Patient Instructions (Addendum)
Please continue [name] once daily every day. Keep your albuterol available to use 2 puffs if needed for shortness of breath, chest tightness, wheezing. COVID-19, flu and pneumonia shots are all up-to-date. Follow with Dr Lamonte Sakai in 6 months or sooner if you have any problems

## 2020-04-06 NOTE — Assessment & Plan Note (Signed)
Please continue Yulpelri once daily every day. Keep your albuterol available to use 2 puffs if needed for shortness of breath, chest tightness, wheezing. COVID-19, flu and pneumonia shots are all up-to-date. Follow with Dr Lamonte Sakai in 6 months or sooner if you have any problems

## 2020-04-11 ENCOUNTER — Encounter (INDEPENDENT_AMBULATORY_CARE_PROVIDER_SITE_OTHER): Payer: Medicare Other | Admitting: Ophthalmology

## 2020-04-13 ENCOUNTER — Other Ambulatory Visit: Payer: Self-pay

## 2020-04-13 ENCOUNTER — Encounter (INDEPENDENT_AMBULATORY_CARE_PROVIDER_SITE_OTHER): Payer: Self-pay | Admitting: Ophthalmology

## 2020-04-13 ENCOUNTER — Ambulatory Visit (INDEPENDENT_AMBULATORY_CARE_PROVIDER_SITE_OTHER): Payer: Medicare Other | Admitting: Ophthalmology

## 2020-04-13 DIAGNOSIS — H3561 Retinal hemorrhage, right eye: Secondary | ICD-10-CM | POA: Diagnosis not present

## 2020-04-13 DIAGNOSIS — H353133 Nonexudative age-related macular degeneration, bilateral, advanced atrophic without subfoveal involvement: Secondary | ICD-10-CM | POA: Diagnosis not present

## 2020-04-13 DIAGNOSIS — H353211 Exudative age-related macular degeneration, right eye, with active choroidal neovascularization: Secondary | ICD-10-CM | POA: Diagnosis not present

## 2020-04-13 MED ORDER — BEVACIZUMAB CHEMO INJECTION 1.25MG/0.05ML SYRINGE FOR KALEIDOSCOPE
1.2500 mg | INTRAVITREAL | Status: AC | PRN
  Administered 2020-04-13: 17:00:00 1.25 mg via INTRAVITREAL

## 2020-04-13 NOTE — Progress Notes (Signed)
04/13/2020     CHIEF COMPLAINT Patient presents for Macular Degeneration and Retina Follow Up   HISTORY OF PRESENT ILLNESS: [name] is a 86 y.o. male who presents to the clinic today for:   HPI    Retina Follow Up    Patient presents with  Wet AMD.  In right eye.  Duration of 5 weeks.  Since onset it is stable.          Comments    5 week follow up - OCT OU, Possible Avastin OD Patient denies change in vision and overall has no complaints.        Last edited by Hurman Horn, MD on 04/13/2020  5:03 PM. (History)      Referring physician: Cyndi Bender, PA-C [place],   [id number]  HISTORICAL INFORMATION:   Selected notes from the MEDICAL RECORD NUMBER    Lab Results  Component Value Date   HGBA1C  07/17/2009    5.9 (NOTE) The ADA recommends the following therapeutic goal for glycemic control related to Hgb A1c measurement: Goal of therapy: <6.5 Hgb A1c  Reference: American Diabetes Association: Clinical Practice Recommendations 2010, Diabetes Care, 2010, 33: (Suppl  1).     CURRENT MEDICATIONS: Current Outpatient Medications (Ophthalmic Drugs)  Medication Sig  . cycloSPORINE (RESTASIS) 0.05 % ophthalmic emulsion Place 1 drop into both eyes in the morning and at bedtime.  Marland Kitchen SIMBRINZA 1-0.2 % SUSP Apply 1 drop to eye 2 (two) times daily.   No current facility-administered medications for this visit. (Ophthalmic Drugs)   Current Outpatient Medications (Other)  Medication Sig  . atorvastatin (LIPITOR) 40 MG tablet TAKE 1 TABLET (40 MG TOTAL) BY MOUTH DAILY. KEEP OV.  . Chloramphenicol Palmitate POWD Place 2 puffs into both ears daily.  . digoxin (LANOXIN) 0.125 MG tablet Take 0.0625 mg by mouth daily.  . diphenhydramine-acetaminophen (TYLENOL PM) 25-500 MG TABS Take 2 tablets by mouth at bedtime.   Marland Kitchen ELIQUIS 2.5 MG TABS tablet TAKE 1 TABLET BY MOUTH  TWICE DAILY  . ENSURE (ENSURE) Take 1 Can by mouth daily.  . furosemide (LASIX) 40  MG tablet TAKE 1.5 TABLETS (60 MG TOTAL) BY MOUTH DAILY. KEEP OV.  Marland Kitchen Methylcellulose, Laxative, (CITRUCEL) 500 MG TABS Take 1,000 mg by mouth at bedtime.  . metoprolol tartrate (LOPRESSOR) 50 MG tablet Take 1.5 tablets (75 mg total) by mouth 2 (two) times daily.  . midodrine (PROAMATINE) 2.5 MG tablet Take 1 tablet (2.5 mg total) by mouth 2 (two) times daily with a meal.  . niacin (NIASPAN) 500 MG CR tablet Take 1 tablet (500 mg total) by mouth at bedtime. TAKE 1 TABLET BY MOUTH AT BEDTIME. MUST MAKE OFFICE APPOINTMENT  . PROAIR HFA 108 (90 Base) MCG/ACT inhaler INHALE 2 PUFFS INTO THE  LUNGS EVERY 4 HOURS AS  NEEDED FOR WHEEZING OR  SHORTNESS OF BREATH  . Probiotic Product ([employer] SENIOR PROBIOTIC PO) Take 1 capsule by mouth at bedtime. Once daily  . revefenacin (YUPELRI) 175 MCG/3ML nebulizer solution Inhale 3 mLs (175 mcg total) into the lungs daily.  . Tamsulosin HCl (FLOMAX) 0.4 MG CAPS Take 0.4 mg by mouth at bedtime.    No current facility-administered medications for this visit. (Other)      REVIEW OF SYSTEMS:    ALLERGIES No Known Allergies  PAST MEDICAL HISTORY Past Medical History:  Diagnosis Date  . ACS (acute coronary syndrome) ([place])   . Acute respiratory failure with hypoxia ([place])  05/2017  . Anemia of renal disease 12/05/2011  . Anemia, iron deficiency 12/05/2011  . Asthma   . CAS (cerebral atherosclerosis)    Carotid Dopplers, 04/09/2012 - Bilateral Bulb/Proximal ICAs-mild to moderate amount of fibrous plaque of fibrous soft plaque w/o evidence of a significant diameter reduction, dissection, or any other vascular abnormality  . COPD (chronic obstructive pulmonary disease) ([place])   . Coronary heart disease    a. s/p CABG in 2006 with LIMA-LAD, SVG-Cx, SVG-IM-OM b. low-risk NST in 08/2016  . Dysrhythmia 11/09/2013   SVT VS A FLUTTER   . Kidney stones   . MGUS (monoclonal gammopathy of unknown significance) 12/05/2011  . Orthostatic hypotension 09/10/2016  . PAF  (paroxysmal atrial fibrillation) (HCC)    a. on Eliquis  . Shortness of breath   . SSS (sick sinus syndrome) ([place])    a. s/p PPM placement in 2015   Past Surgical History:  Procedure Laterality Date  . CARDIAC CATHETERIZATION  12/05/2005   Recommended CABG  . CARDIOVERSION N/A 11/09/2013   Procedure: CARDIOVERSION;  Surgeon: Pixie Casino, MD;  Location: [facility] ENDOSCOPY;  Service: Cardiovascular;  Laterality: N/A;  . CATARACT EXTRACTION    . CORONARY ARTERY BYPASS GRAFT  12/06/2005   x4, LIMA to LAD, vein to distal circumflex, sequential vein to intermediate and obtuse marginal vessel  . INNER EAR SURGERY    . KIDNEY STONE SURGERY    . LOOP RECORDER IMPLANT N/A 11/30/2013   Procedure: LOOP RECORDER IMPLANT;  Surgeon: Sanda Klein, MD;  Location: [place] CATH LAB;  Service: Cardiovascular;  Laterality: N/A;  . PERMANENT PACEMAKER INSERTION N/A 02/04/2014   Procedure: PERMANENT PACEMAKER INSERTION;  Surgeon: Sanda Klein, MD;  Location: [place] CATH LAB;  Service: Cardiovascular;  Laterality: N/A;  . TEE WITHOUT CARDIOVERSION N/A 11/09/2013   Procedure: TRANSESOPHAGEAL ECHOCARDIOGRAM (TEE);  Surgeon: Pixie Casino, MD;  Location: [facility] ENDOSCOPY;  Service: Cardiovascular;  Laterality: N/A;    FAMILY HISTORY Family History  Problem Relation Age of Onset  . Tuberculosis Father   . Heart attack Father   . Heart disease Mother   . Heart attack Mother   . Heart disease Brother   . Emphysema Daughter        premature without tobacco exposure    SOCIAL HISTORY Social History   Tobacco Use  . Smoking status: Former Smoker    Packs/day: 1.00    Years: 47.00    Pack years: 47.00    Types: Cigarettes    Start date: 12/02/1951    Quit date: 12/30/1993    Years since quitting: 26.3  . Smokeless tobacco: Never Used  . Tobacco comment: quit 20 years ago  Substance Use Topics  . Alcohol use: No    Alcohol/week: 0.0 standard drinks  . Drug use: No         OPHTHALMIC EXAM:  Base Eye Exam     Visual Acuity (Snellen - Linear)      Right Left   Dist cc 20/50+1 20/50   Dist ph cc NI 20/30-2       Tonometry (Tonopen, 4:25 PM)      Right Left   Pressure 14 12       Pupils      Pupils Dark Light Shape React APD   Right PERRL 3 3 Round Minimal None   Left PERRL 3 3 Round Minimal None       Visual Fields (Counting fingers)      Left Right  Full Full       Extraocular Movement      Right Left    Full Full       Neuro/Psych    Oriented x3: Yes   Mood/Affect: Normal       Dilation    Right eye: 1.0% Mydriacyl, 2.5% Phenylephrine @ 4:25 PM        Slit Lamp and Fundus Exam    External Exam      Right Left   External Normal Normal       Slit Lamp Exam      Right Left   Lids/Lashes Normal Normal   Conjunctiva/Sclera White and quiet White and quiet   Cornea Clear Clear   Anterior Chamber Deep and quiet Deep and quiet   Iris Round and reactive Round and reactive   Lens Posterior chamber intraocular lens Posterior chamber intraocular lens   Anterior Vitreous Normal Normal       Fundus Exam      Right Left   Posterior Vitreous Posterior vitreous detachment    Disc Peripapillary atrophy    C/D Ratio 0.7    Macula Atrophy, Retinal pigment epithelial atrophy, Age related macular degeneration, Advanced age related macular degeneration, Disciform scar, Subretinal hemorrhage    Vessels Normal    Periphery Normal           IMAGING AND PROCEDURES  Imaging and Procedures for @TODAY @  OCT, Retina - OU - Both Eyes       Right Eye Quality was good. Scan locations included subfoveal. Central Foveal Thickness: 335. Findings include abnormal foveal contour, subretinal hyper-reflective material, disciform scar, subretinal fluid.   Left Eye Quality was good. Central Foveal Thickness: 238. Findings include abnormal foveal contour, outer retinal atrophy, central retinal atrophy.   Notes OD with chronic active CN VM, at 5-week interval of intravitreal  Avastin.  Repeat today and exam in 5 weeks       Intravitreal Injection, Pharmacologic Agent - OD - Right Eye       Time Out 04/13/2020. 5:06 PM. Confirmed correct patient, procedure, site, and patient consented.   Anesthesia Topical anesthesia was used. Anesthetic medications included Akten 3.5%.   Procedure Preparation included Ofloxacin , 5% betadine to ocular surface, 10% betadine to eyelids. A 30 gauge needle was used.   Injection:  1.25 mg Bevacizumab (AVASTIN) SOLN   NDC: [id number], Lot: [id number]   Route: Intravitreal, Site: Right Eye, Waste: 0 mg  Post-op Post injection exam found visual acuity of at least counting fingers. The patient tolerated the procedure well. There were no complications. The patient received written and verbal post procedure care education. Post injection medications were not given.                 ASSESSMENT/PLAN:  No problem-specific Assessment & Plan notes found for this encounter.      ICD-10-CM   1. Exudative age-related macular degeneration of right eye with active choroidal neovascularization (HCC)  H35.3211 OCT, Retina - OU - Both Eyes    Intravitreal Injection, Pharmacologic Agent - OD - Right Eye    Bevacizumab (AVASTIN) SOLN 1.25 mg  2. Retinal hemorrhage, right  H35.61   3. Advanced nonexudative age-related macular degeneration of both eyes without subfoveal involvement  H35.3133     1.  Repeat Avastin OD today to maintain.  2.  Chronic active CN VM OD.  3.  Ophthalmic Meds Ordered this visit:  Meds ordered this encounter  Medications  . Bevacizumab (AVASTIN)  SOLN 1.25 mg       Return in about 5 weeks (around 05/18/2020) for AVASTIN OCT, OD.  There are no Patient Instructions on file for this visit.   Explained the diagnoses, plan, and follow up with the patient and they expressed understanding.  Patient expressed understanding of the importance of proper follow up care.   Clent Demark [place] Retana M.D. Diseases &  Surgery of the Retina and Vitreous Retina & Diabetic [place] @TODAY @     Abbreviations: M myopia (nearsighted); A astigmatism; H hyperopia (farsighted); P presbyopia; Mrx spectacle prescription;  CTL contact lenses; OD right eye; OS left eye; OU both eyes  XT exotropia; ET esotropia; PEK punctate epithelial keratitis; PEE punctate epithelial erosions; DES dry eye syndrome; MGD meibomian gland dysfunction; ATs artificial tears; PFAT's preservative free artificial tears; [place] nuclear sclerotic cataract; PSC posterior subcapsular cataract; ERM epi-retinal membrane; PVD posterior vitreous detachment; RD retinal detachment; DM diabetes mellitus; DR diabetic retinopathy; NPDR non-proliferative diabetic retinopathy; PDR proliferative diabetic retinopathy; CSME clinically significant macular edema; DME diabetic macular edema; dbh dot blot hemorrhages; CWS cotton wool spot; POAG primary open angle glaucoma; C/D cup-to-disc ratio; HVF humphrey visual field; GVF goldmann visual field; OCT optical coherence tomography; IOP intraocular pressure; BRVO Branch retinal vein occlusion; CRVO central retinal vein occlusion; CRAO central retinal artery occlusion; BRAO branch retinal artery occlusion; RT retinal tear; SB scleral buckle; PPV pars plana vitrectomy; VH Vitreous hemorrhage; PRP panretinal laser photocoagulation; IVK intravitreal kenalog; VMT vitreomacular traction; MH Macular hole;  NVD neovascularization of the disc; NVE neovascularization elsewhere; AREDS age related eye disease study; ARMD age related macular degeneration; POAG primary open angle glaucoma; EBMD epithelial/anterior basement membrane dystrophy; ACIOL anterior chamber intraocular lens; IOL intraocular lens; PCIOL posterior chamber intraocular lens; Phaco/IOL phacoemulsification with intraocular lens placement; [place] photorefractive keratectomy; LASIK laser assisted in situ keratomileusis; HTN hypertension; DM diabetes mellitus; COPD chronic  obstructive pulmonary disease

## 2020-04-19 DIAGNOSIS — H60332 Swimmer's ear, left ear: Secondary | ICD-10-CM | POA: Diagnosis not present

## 2020-04-20 DIAGNOSIS — H9041 Sensorineural hearing loss, unilateral, right ear, with unrestricted hearing on the contralateral side: Secondary | ICD-10-CM | POA: Diagnosis not present

## 2020-04-20 DIAGNOSIS — H9072 Mixed conductive and sensorineural hearing loss, unilateral, left ear, with unrestricted hearing on the contralateral side: Secondary | ICD-10-CM | POA: Diagnosis not present

## 2020-05-02 DIAGNOSIS — H9212 Otorrhea, left ear: Secondary | ICD-10-CM | POA: Diagnosis not present

## 2020-05-08 ENCOUNTER — Other Ambulatory Visit: Payer: Self-pay | Admitting: Cardiovascular Disease

## 2020-05-16 DIAGNOSIS — H9212 Otorrhea, left ear: Secondary | ICD-10-CM | POA: Diagnosis not present

## 2020-05-18 ENCOUNTER — Encounter (INDEPENDENT_AMBULATORY_CARE_PROVIDER_SITE_OTHER): Payer: Self-pay | Admitting: Ophthalmology

## 2020-05-18 ENCOUNTER — Other Ambulatory Visit: Payer: Self-pay

## 2020-05-18 ENCOUNTER — Ambulatory Visit (INDEPENDENT_AMBULATORY_CARE_PROVIDER_SITE_OTHER): Payer: Medicare Other | Admitting: Ophthalmology

## 2020-05-18 DIAGNOSIS — H353133 Nonexudative age-related macular degeneration, bilateral, advanced atrophic without subfoveal involvement: Secondary | ICD-10-CM | POA: Diagnosis not present

## 2020-05-18 DIAGNOSIS — H3561 Retinal hemorrhage, right eye: Secondary | ICD-10-CM | POA: Insufficient documentation

## 2020-05-18 DIAGNOSIS — H353211 Exudative age-related macular degeneration, right eye, with active choroidal neovascularization: Secondary | ICD-10-CM | POA: Diagnosis not present

## 2020-05-18 MED ORDER — BEVACIZUMAB CHEMO INJECTION 1.25MG/0.05ML SYRINGE FOR KALEIDOSCOPE
1.2500 mg | INTRAVITREAL | Status: AC | PRN
  Administered 2020-05-18: 1.25 mg via INTRAVITREAL

## 2020-05-18 NOTE — Assessment & Plan Note (Signed)
The nature of wet macular degeneration was discussed with the patient.  Forms of therapy reviewed include the use of Anti-VEGF medications injected painlessly into the eye, as well as other possible treatment modalities, including thermal laser therapy. Fellow eye involvement and risks were discussed with the patient. Upon the finding of wet age related macular degeneration, treatment will be offered. The treatment regimen is on a treat as needed basis with the intent to treat if necessary and extend interval of exams when possible. On average 1 out of 6 patients do not need lifetime therapy. However, the risk of recurrent disease is high for a lifetime.  Initially monthly, then periodic, examinations and evaluations will determine whether the next treatment is required on the day of the examination.  OD, still active, repeat intravitreal Avastin

## 2020-05-18 NOTE — Progress Notes (Signed)
05/18/2020     CHIEF COMPLAINT Patient presents for Retina Follow Up   HISTORY OF PRESENT ILLNESS: [name] is a 86 y.o. male who presents to the clinic today for:   HPI    Retina Follow Up    Patient presents with  Wet AMD.  In right eye.  Severity is moderate.  Duration of 5 weeks.  Since onset it is stable.  I, the attending physician,  performed the HPI with the patient and updated documentation appropriately.          Comments    5 Week AMD f\u OD. Possible Avastin OD. OCT  Pt c/o vision being fuzzy.       Last edited by Tilda Franco on 05/18/2020  3:11 PM. (History)      Referring physician: Cyndi Bender, PA-C [place],  [place] [address]  HISTORICAL INFORMATION:   Selected notes from the MEDICAL RECORD NUMBER    Lab Results  Component Value Date   HGBA1C  07/17/2009    5.9 (NOTE) The ADA recommends the following therapeutic goal for glycemic control related to Hgb A1c measurement: Goal of therapy: <6.5 Hgb A1c  Reference: American Diabetes Association: Clinical Practice Recommendations 2010, Diabetes Care, 2010, 33: (Suppl  1).     CURRENT MEDICATIONS: Current Outpatient Medications (Ophthalmic Drugs)  Medication Sig  . cycloSPORINE (RESTASIS) 0.05 % ophthalmic emulsion Place 1 drop into both eyes in the morning and at bedtime.  Marland Kitchen SIMBRINZA 1-0.2 % SUSP Apply 1 drop to eye 2 (two) times daily.   No current facility-administered medications for this visit. (Ophthalmic Drugs)   Current Outpatient Medications (Other)  Medication Sig  . atorvastatin (LIPITOR) 40 MG tablet TAKE 1 TABLET (40 MG TOTAL) BY MOUTH DAILY. KEEP OV.  . Chloramphenicol Palmitate POWD Place 2 puffs into both ears daily.  . digoxin (LANOXIN) 0.125 MG tablet Take 0.0625 mg by mouth daily.  . diphenhydramine-acetaminophen (TYLENOL PM) 25-500 MG TABS Take 2 tablets by mouth at bedtime.   Marland Kitchen ELIQUIS 2.5 MG TABS tablet TAKE 1 TABLET BY MOUTH  TWICE DAILY  .  ENSURE (ENSURE) Take 1 Can by mouth daily.  . furosemide (LASIX) 40 MG tablet TAKE 1.5 TABLETS (60 MG TOTAL) BY MOUTH DAILY. KEEP OV.  Marland Kitchen Methylcellulose, Laxative, (CITRUCEL) 500 MG TABS Take 1,000 mg by mouth at bedtime.  . metoprolol tartrate (LOPRESSOR) 50 MG tablet Take 1.5 tablets (75 mg total) by mouth 2 (two) times daily.  . midodrine (PROAMATINE) 2.5 MG tablet Take 1 tablet (2.5 mg total) by mouth 2 (two) times daily with a meal.  . niacin (NIASPAN) 500 MG CR tablet TAKE 1 TABLET BY MOUTH AT BEDTIME. MUST MAKE OFFICE APPOINTMENT  . PROAIR HFA 108 (90 Base) MCG/ACT inhaler INHALE 2 PUFFS INTO THE  LUNGS EVERY 4 HOURS AS  NEEDED FOR WHEEZING OR  SHORTNESS OF BREATH  . Probiotic Product ([employer] SENIOR PROBIOTIC PO) Take 1 capsule by mouth at bedtime. Once daily  . revefenacin (YUPELRI) 175 MCG/3ML nebulizer solution Inhale 3 mLs (175 mcg total) into the lungs daily.  . Tamsulosin HCl (FLOMAX) 0.4 MG CAPS Take 0.4 mg by mouth at bedtime.    No current facility-administered medications for this visit. (Other)      REVIEW OF SYSTEMS:    ALLERGIES No Known Allergies  PAST MEDICAL HISTORY Past Medical History:  Diagnosis Date  . ACS (acute coronary syndrome) ([place])   . Acute respiratory failure with hypoxia ([place])  05/2017  . Anemia of renal disease 12/05/2011  . Anemia, iron deficiency 12/05/2011  . Asthma   . CAS (cerebral atherosclerosis)    Carotid Dopplers, 04/09/2012 - Bilateral Bulb/Proximal ICAs-mild to moderate amount of fibrous plaque of fibrous soft plaque w/o evidence of a significant diameter reduction, dissection, or any other vascular abnormality  . COPD (chronic obstructive pulmonary disease) ([place])   . Coronary heart disease    a. s/p CABG in 2006 with LIMA-LAD, SVG-Cx, SVG-IM-OM b. low-risk NST in 08/2016  . Dysrhythmia 11/09/2013   SVT VS A FLUTTER   . Kidney stones   . MGUS (monoclonal gammopathy of unknown significance) 12/05/2011  . Orthostatic hypotension 09/10/2016   . PAF (paroxysmal atrial fibrillation) (HCC)    a. on Eliquis  . Shortness of breath   . SSS (sick sinus syndrome) ([place])    a. s/p PPM placement in 2015   Past Surgical History:  Procedure Laterality Date  . CARDIAC CATHETERIZATION  12/05/2005   Recommended CABG  . CARDIOVERSION N/A 11/09/2013   Procedure: CARDIOVERSION;  Surgeon: Pixie Casino, MD;  Location: [facility] ENDOSCOPY;  Service: Cardiovascular;  Laterality: N/A;  . CATARACT EXTRACTION    . CORONARY ARTERY BYPASS GRAFT  12/06/2005   x4, LIMA to LAD, vein to distal circumflex, sequential vein to intermediate and obtuse marginal vessel  . INNER EAR SURGERY    . KIDNEY STONE SURGERY    . LOOP RECORDER IMPLANT N/A 11/30/2013   Procedure: LOOP RECORDER IMPLANT;  Surgeon: Sanda Klein, MD;  Location: [facility] CATH LAB;  Service: Cardiovascular;  Laterality: N/A;  . PERMANENT PACEMAKER INSERTION N/A 02/04/2014   Procedure: PERMANENT PACEMAKER INSERTION;  Surgeon: Sanda Klein, MD;  Location: [facility] CATH LAB;  Service: Cardiovascular;  Laterality: N/A;  . TEE WITHOUT CARDIOVERSION N/A 11/09/2013   Procedure: TRANSESOPHAGEAL ECHOCARDIOGRAM (TEE);  Surgeon: Pixie Casino, MD;  Location: [facility] ENDOSCOPY;  Service: Cardiovascular;  Laterality: N/A;    FAMILY HISTORY Family History  Problem Relation Age of Onset  . Tuberculosis Father   . Heart attack Father   . Heart disease Mother   . Heart attack Mother   . Heart disease Brother   . Emphysema Daughter        premature without tobacco exposure    SOCIAL HISTORY Social History   Tobacco Use  . Smoking status: Former Smoker    Packs/day: 1.00    Years: 47.00    Pack years: 47.00    Types: Cigarettes    Start date: 12/02/1951    Quit date: 12/30/1993    Years since quitting: 26.4  . Smokeless tobacco: Never Used  . Tobacco comment: quit 20 years ago  Substance Use Topics  . Alcohol use: No    Alcohol/week: 0.0 standard drinks  . Drug use: No         OPHTHALMIC EXAM:  Base  Eye Exam    Visual Acuity (Snellen - Linear)      Right Left   Dist cc 20/50 20/50 -2   Dist ph cc NI NI   Correction: Glasses       Tonometry (Tonopen, 3:16 PM)      Right Left   Pressure 15 10       Pupils      Dark Light Shape React APD   Right 3 2.5 Round Slow None   Left 3 3 Round Minimal None       Visual Fields (Counting fingers)      Left Right  Full Full       Neuro/Psych    Oriented x3: Yes   Mood/Affect: Normal       Dilation    Right eye: 1.0% Mydriacyl, 2.5% Phenylephrine @ 3:16 PM        Slit Lamp and Fundus Exam    External Exam      Right Left   External Normal Normal       Slit Lamp Exam      Right Left   Lids/Lashes Normal Normal   Conjunctiva/Sclera White and quiet White and quiet   Cornea Clear Clear   Anterior Chamber Deep and quiet Deep and quiet   Iris Round and reactive Round and reactive   Lens Posterior chamber intraocular lens Posterior chamber intraocular lens   Anterior Vitreous Normal Normal       Fundus Exam      Right Left   Posterior Vitreous Posterior vitreous detachment    Disc Peripapillary atrophy    C/D Ratio 0.7    Macula  Retinal pigment epithelial atrophy, Age related macular degeneration, Advanced age related macular degeneration, Disciform scar, Subretinal hemorrhage, Pigmented atrophy    Vessels Normal    Periphery Normal           IMAGING AND PROCEDURES  Imaging and Procedures for 05/18/20  OCT, Retina - OU - Both Eyes       Right Eye Quality was good. Scan locations included subfoveal. Central Foveal Thickness: 276. Progression has improved. Findings include abnormal foveal contour, cystoid macular edema, choroidal neovascular membrane, vitreomacular adhesion .   Left Eye Quality was good. Scan locations included subfoveal. Central Foveal Thickness: 237. Findings include abnormal foveal contour, retinal drusen .   Notes Active but smaller subretinal hemorrhage, CNVM OD.       Intravitreal  Injection, Pharmacologic Agent - OD - Right Eye       Time Out 05/18/2020. 4:10 PM. Confirmed correct patient, procedure, site, and patient consented.   Anesthesia Topical anesthesia was used. Anesthetic medications included Akten 3.5%.   Procedure Preparation included Ofloxacin , 10% betadine to eyelids. A 30 gauge needle was used.   Injection:  1.25 mg Bevacizumab (AVASTIN) SOLN   NDC: [id number], [id number]   Route: Intravitreal, Site: Right Eye, Waste: 0 mg  Post-op Post injection exam found visual acuity of at least counting fingers. The patient tolerated the procedure well. There were no complications. The patient received written and verbal post procedure care education. Post injection medications were not given.                 ASSESSMENT/PLAN:  Exudative age-related macular degeneration of right eye with active choroidal neovascularization (HCC) The nature of wet macular degeneration was discussed with the patient.  Forms of therapy reviewed include the use of Anti-VEGF medications injected painlessly into the eye, as well as other possible treatment modalities, including thermal laser therapy. Fellow eye involvement and risks were discussed with the patient. Upon the finding of wet age related macular degeneration, treatment will be offered. The treatment regimen is on a treat as needed basis with the intent to treat if necessary and extend interval of exams when possible. On average 1 out of 6 patients do not need lifetime therapy. However, the risk of recurrent disease is high for a lifetime.  Initially monthly, then periodic, examinations and evaluations will determine whether the next treatment is required on the day of the examination.  OD, still active, repeat intravitreal Avastin  ICD-10-CM   1. Exudative age-related macular degeneration of right eye with active choroidal neovascularization (HCC)  H35.3211 OCT, Retina - OU - Both Eyes    Intravitreal  Injection, Pharmacologic Agent - OD - Right Eye    Bevacizumab (AVASTIN) SOLN 1.25 mg  2. Retinal hemorrhage, right  H35.61   3. 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  2.  Repeat intravitreal Avastin OD today and examination in 5 weeks possible Avastin OD.  3.  Ophthalmic Meds Ordered this visit:  Meds ordered this encounter  Medications  . Bevacizumab (AVASTIN) SOLN 1.25 mg       Return in about 5 weeks (around 06/22/2020) for AVASTIN OCT, OD.  There are no Patient Instructions on file for this visit.   Explained the diagnoses, plan, and follow up with the patient and they expressed understanding.  Patient expressed understanding of the importance of proper follow up care.   Clent Demark Reeya Bound M.D. Diseases & Surgery of the Retina and Vitreous Retina & Diabetic [facility] 05/18/20     Abbreviations: M myopia (nearsighted); A astigmatism; H hyperopia (farsighted); P presbyopia; Mrx spectacle prescription;  CTL contact lenses; OD right eye; OS left eye; OU both eyes  XT exotropia; ET esotropia; PEK punctate epithelial keratitis; PEE punctate epithelial erosions; DES dry eye syndrome; MGD meibomian gland dysfunction; ATs artificial tears; PFAT's preservative free artificial tears; [place] nuclear sclerotic cataract; PSC posterior subcapsular cataract; ERM epi-retinal membrane; PVD posterior vitreous detachment; RD retinal detachment; DM diabetes mellitus; DR diabetic retinopathy; NPDR non-proliferative diabetic retinopathy; PDR proliferative diabetic retinopathy; CSME clinically significant macular edema; DME diabetic macular edema; dbh dot blot hemorrhages; CWS cotton wool spot; POAG primary open angle glaucoma; C/D cup-to-disc ratio; HVF humphrey visual field; GVF goldmann visual field; OCT optical coherence tomography; IOP intraocular pressure; BRVO Branch retinal  vein occlusion; CRVO central retinal vein occlusion; CRAO central retinal artery occlusion; BRAO branch retinal artery occlusion; RT retinal tear; SB scleral buckle; PPV pars plana vitrectomy; VH Vitreous hemorrhage; PRP panretinal laser photocoagulation; IVK intravitreal kenalog; VMT vitreomacular traction; MH Macular hole;  NVD neovascularization of the disc; NVE neovascularization elsewhere; AREDS age related eye disease study; ARMD age related macular degeneration; POAG primary open angle glaucoma; EBMD epithelial/anterior basement membrane dystrophy; ACIOL anterior chamber intraocular lens; IOL intraocular lens; PCIOL posterior chamber intraocular lens; Phaco/IOL phacoemulsification with intraocular lens placement; [place] photorefractive keratectomy; LASIK laser assisted in situ keratomileusis; HTN hypertension; DM diabetes mellitus; COPD chronic obstructive pulmonary disease

## 2020-05-23 ENCOUNTER — Other Ambulatory Visit: Payer: Self-pay | Admitting: *Deleted

## 2020-05-23 MED ORDER — METOPROLOL TARTRATE 50 MG PO TABS: 75.0000 mg | ORAL_TABLET | Freq: Two times a day (BID) | ORAL | 3 refills | Status: DC

## 2020-05-23 NOTE — Telephone Encounter (Signed)
Rx has been sent to the pharmacy electronically. ° °

## 2020-05-30 DIAGNOSIS — H9212 Otorrhea, left ear: Secondary | ICD-10-CM | POA: Diagnosis not present

## 2020-06-05 ENCOUNTER — Emergency Department (HOSPITAL_COMMUNITY)
Admission: EM | Admit: 2020-06-05 | Discharge: 2020-06-05 | Discharge status: Against Medical Advice | Payer: Medicare Other

## 2020-06-05 DIAGNOSIS — M25532 Pain in left wrist: Secondary | ICD-10-CM | POA: Diagnosis not present

## 2020-06-05 DIAGNOSIS — R509 Fever, unspecified: Secondary | ICD-10-CM | POA: Diagnosis not present

## 2020-06-05 NOTE — ED Notes (Signed)
Pt daughter stated that the wait was too long and that she would take her father to follow up with primary care provider.

## 2020-06-06 DIAGNOSIS — M25532 Pain in left wrist: Secondary | ICD-10-CM | POA: Diagnosis not present

## 2020-06-09 MED ORDER — APIXABAN 2.5 MG PO TABS: 2.5000 mg | ORAL_TABLET | Freq: Two times a day (BID) | ORAL | 1 refills | Status: AC

## 2020-06-09 NOTE — Addendum Note (Signed)
Addended by: Lubertha Sayres on: 06/09/2020 03:23 PM   Modules accepted: Orders

## 2020-06-13 DIAGNOSIS — H9212 Otorrhea, left ear: Secondary | ICD-10-CM | POA: Diagnosis not present

## 2020-06-14 ENCOUNTER — Ambulatory Visit (INDEPENDENT_AMBULATORY_CARE_PROVIDER_SITE_OTHER): Payer: Medicare Other | Admitting: *Deleted

## 2020-06-14 DIAGNOSIS — R55 Syncope and collapse: Secondary | ICD-10-CM | POA: Diagnosis not present

## 2020-06-15 DIAGNOSIS — M25532 Pain in left wrist: Secondary | ICD-10-CM | POA: Diagnosis not present

## 2020-06-15 LAB — CUP PACEART REMOTE DEVICE CHECK
Battery Impedance: 451 Ohm
Battery Remaining Longevity: 98 mo
Battery Voltage: 2.79 V
Brady Statistic AP VP Percent: 1 %
Brady Statistic AP VS Percent: 18 %
Brady Statistic AS VP Percent: 3 %
Brady Statistic AS VS Percent: 79 %
Date Time Interrogation Session: 20210617113228
Implantable Lead Implant Date: 20150206
Implantable Lead Implant Date: 20150206
Implantable Lead Location: 753859
Implantable Lead Location: 753860
Implantable Lead Model: 5076
Implantable Lead Model: 5076
Implantable Pulse Generator Implant Date: 20150206
Lead Channel Impedance Value: 438 Ohm
Lead Channel Impedance Value: 569 Ohm
Lead Channel Pacing Threshold Amplitude: 0.375 V
Lead Channel Pacing Threshold Amplitude: 0.75 V
Lead Channel Pacing Threshold Pulse Width: 0.4 ms
Lead Channel Pacing Threshold Pulse Width: 0.4 ms
Lead Channel Setting Pacing Amplitude: 2 V
Lead Channel Setting Pacing Amplitude: 2.5 V
Lead Channel Setting Pacing Pulse Width: 0.4 ms
Lead Channel Setting Sensing Sensitivity: 4 mV

## 2020-06-16 NOTE — Progress Notes (Signed)
Remote pacemaker transmission.   

## 2020-06-22 ENCOUNTER — Other Ambulatory Visit: Payer: Self-pay

## 2020-06-22 ENCOUNTER — Ambulatory Visit (INDEPENDENT_AMBULATORY_CARE_PROVIDER_SITE_OTHER): Payer: Medicare Other | Admitting: Ophthalmology

## 2020-06-22 ENCOUNTER — Encounter (INDEPENDENT_AMBULATORY_CARE_PROVIDER_SITE_OTHER): Payer: Self-pay | Admitting: Ophthalmology

## 2020-06-22 DIAGNOSIS — H353211 Exudative age-related macular degeneration, right eye, with active choroidal neovascularization: Secondary | ICD-10-CM | POA: Diagnosis not present

## 2020-06-22 MED ORDER — BEVACIZUMAB CHEMO INJECTION 1.25MG/0.05ML SYRINGE FOR KALEIDOSCOPE
1.2500 mg | INTRAVITREAL | Status: AC | PRN
  Administered 2020-06-22: 1.25 mg via INTRAVITREAL

## 2020-06-22 NOTE — Progress Notes (Signed)
06/22/2020     CHIEF COMPLAINT Patient presents for Retina Follow Up   HISTORY OF PRESENT ILLNESS: [name] is a 86 y.o. male who presents to the clinic today for:   HPI    Retina Follow Up    Patient presents with  Wet AMD.  In right eye.  Duration of 5 weeks.  Since onset it is stable.          Comments    5 week fu- OCT OU, Poss Avastin OD Patient denies change in vision and overall has no complaints.        Last edited by Gerda Diss on 06/22/2020  2:33 PM. (History)      Referring physician: Cyndi Bender, PA-C [place],  [place] [address]  HISTORICAL INFORMATION:   Selected notes from the MEDICAL RECORD NUMBER    Lab Results  Component Value Date   HGBA1C  07/17/2009    5.9 (NOTE) The ADA recommends the following therapeutic goal for glycemic control related to Hgb A1c measurement: Goal of therapy: <6.5 Hgb A1c  Reference: American Diabetes Association: Clinical Practice Recommendations 2010, Diabetes Care, 2010, 33: (Suppl  1).     CURRENT MEDICATIONS: Current Outpatient Medications (Ophthalmic Drugs)  Medication Sig  . cycloSPORINE (RESTASIS) 0.05 % ophthalmic emulsion Place 1 drop into both eyes in the morning and at bedtime.  Marland Kitchen SIMBRINZA 1-0.2 % SUSP Apply 1 drop to eye 2 (two) times daily.   No current facility-administered medications for this visit. (Ophthalmic Drugs)   Current Outpatient Medications (Other)  Medication Sig  . apixaban (ELIQUIS) 2.5 MG TABS tablet Take 1 tablet (2.5 mg total) by mouth 2 (two) times daily.  Marland Kitchen atorvastatin (LIPITOR) 40 MG tablet TAKE 1 TABLET (40 MG TOTAL) BY MOUTH DAILY. KEEP OV.  . Chloramphenicol Palmitate POWD Place 2 puffs into both ears daily.  . digoxin (LANOXIN) 0.125 MG tablet Take 0.0625 mg by mouth daily.  . diphenhydramine-acetaminophen (TYLENOL PM) 25-500 MG TABS Take 2 tablets by mouth at bedtime.   . ENSURE (ENSURE) Take 1 Can by mouth daily.  . furosemide (LASIX) 40 MG  tablet TAKE 1.5 TABLETS (60 MG TOTAL) BY MOUTH DAILY. KEEP OV.  Marland Kitchen Methylcellulose, Laxative, (CITRUCEL) 500 MG TABS Take 1,000 mg by mouth at bedtime.  . metoprolol tartrate (LOPRESSOR) 50 MG tablet Take 1.5 tablets (75 mg total) by mouth 2 (two) times daily.  . midodrine (PROAMATINE) 2.5 MG tablet Take 1 tablet (2.5 mg total) by mouth 2 (two) times daily with a meal.  . niacin (NIASPAN) 500 MG CR tablet TAKE 1 TABLET BY MOUTH AT BEDTIME. MUST MAKE OFFICE APPOINTMENT  . PROAIR HFA 108 (90 Base) MCG/ACT inhaler INHALE 2 PUFFS INTO THE  LUNGS EVERY 4 HOURS AS  NEEDED FOR WHEEZING OR  SHORTNESS OF BREATH  . Probiotic Product ([employer] SENIOR PROBIOTIC PO) Take 1 capsule by mouth at bedtime. Once daily  . revefenacin (YUPELRI) 175 MCG/3ML nebulizer solution Inhale 3 mLs (175 mcg total) into the lungs daily.  . Tamsulosin HCl (FLOMAX) 0.4 MG CAPS Take 0.4 mg by mouth at bedtime.    No current facility-administered medications for this visit. (Other)      REVIEW OF SYSTEMS:    ALLERGIES No Known Allergies  PAST MEDICAL HISTORY Past Medical History:  Diagnosis Date  . ACS (acute coronary syndrome) ([place])   . Acute respiratory failure with hypoxia ([place]) 05/2017  . Anemia of renal disease 12/05/2011  . Anemia, iron  deficiency 12/05/2011  . Asthma   . CAS (cerebral atherosclerosis)    Carotid Dopplers, 04/09/2012 - Bilateral Bulb/Proximal ICAs-mild to moderate amount of fibrous plaque of fibrous soft plaque w/o evidence of a significant diameter reduction, dissection, or any other vascular abnormality  . COPD (chronic obstructive pulmonary disease) ([place])   . Coronary heart disease    a. s/p CABG in 2006 with LIMA-LAD, SVG-Cx, SVG-IM-OM b. low-risk NST in 08/2016  . Dysrhythmia 11/09/2013   SVT VS A FLUTTER   . Kidney stones   . MGUS (monoclonal gammopathy of unknown significance) 12/05/2011  . Orthostatic hypotension 09/10/2016  . PAF (paroxysmal atrial fibrillation) (HCC)    a. on Eliquis  .  Shortness of breath   . SSS (sick sinus syndrome) ([place])    a. s/p PPM placement in 2015   Past Surgical History:  Procedure Laterality Date  . CARDIAC CATHETERIZATION  12/05/2005   Recommended CABG  . CARDIOVERSION N/A 11/09/2013   Procedure: CARDIOVERSION;  Surgeon: Pixie Casino, MD;  Location: [facility] ENDOSCOPY;  Service: Cardiovascular;  Laterality: N/A;  . CATARACT EXTRACTION    . CORONARY ARTERY BYPASS GRAFT  12/06/2005   x4, LIMA to LAD, vein to distal circumflex, sequential vein to intermediate and obtuse marginal vessel  . INNER EAR SURGERY    . KIDNEY STONE SURGERY    . LOOP RECORDER IMPLANT N/A 11/30/2013   Procedure: LOOP RECORDER IMPLANT;  Surgeon: Sanda Klein, MD;  Location: [facility] CATH LAB;  Service: Cardiovascular;  Laterality: N/A;  . PERMANENT PACEMAKER INSERTION N/A 02/04/2014   Procedure: PERMANENT PACEMAKER INSERTION;  Surgeon: Sanda Klein, MD;  Location: [facility] CATH LAB;  Service: Cardiovascular;  Laterality: N/A;  . TEE WITHOUT CARDIOVERSION N/A 11/09/2013   Procedure: TRANSESOPHAGEAL ECHOCARDIOGRAM (TEE);  Surgeon: Pixie Casino, MD;  Location: [facility] ENDOSCOPY;  Service: Cardiovascular;  Laterality: N/A;    FAMILY HISTORY Family History  Problem Relation Age of Onset  . Tuberculosis Father   . Heart attack Father   . Heart disease Mother   . Heart attack Mother   . Heart disease Brother   . Emphysema Daughter        premature without tobacco exposure    SOCIAL HISTORY Social History   Tobacco Use  . Smoking status: Former Smoker    Packs/day: 1.00    Years: 47.00    Pack years: 47.00    Types: Cigarettes    Start date: 12/02/1951    Quit date: 12/30/1993    Years since quitting: 26.4  . Smokeless tobacco: Never Used  . Tobacco comment: quit 20 years ago  Vaping Use  . Vaping Use: Never used  Substance Use Topics  . Alcohol use: No    Alcohol/week: 0.0 standard drinks  . Drug use: No         OPHTHALMIC EXAM:  Base Eye Exam    Visual Acuity  (Snellen - Linear)      Right Left   Dist [place] 20/70-2 20/50+1   Dist ph [place] NI 20/30-2       Tonometry (Tonopen, 2:42 PM)      Right Left   Pressure 15 9       Pupils      Pupils Dark Light Shape React APD   Right PERRL 3 2 Round Slow None   Left PERRL 3 3 Round Minimal None       Visual Fields (Counting fingers)      Left Right    Full Full  Extraocular Movement      Right Left    Full Full       Neuro/Psych    Oriented x3: Yes   Mood/Affect: Normal       Dilation    Right eye: 1.0% Mydriacyl, 2.5% Phenylephrine @ 2:42 PM        Slit Lamp and Fundus Exam    External Exam      Right Left   External Normal Normal       Slit Lamp Exam      Right Left   Lids/Lashes Normal Normal   Conjunctiva/Sclera White and quiet White and quiet   Cornea Clear Clear   Anterior Chamber Deep and quiet Deep and quiet   Iris Round and reactive Round and reactive   Lens Posterior chamber intraocular lens Posterior chamber intraocular lens   Anterior Vitreous Normal Normal       Fundus Exam      Right Left   Posterior Vitreous Posterior vitreous detachment    Disc Peripapillary atrophy    C/D Ratio 0.7    Macula  Retinal pigment epithelial atrophy, Age related macular degeneration, Advanced age related macular degeneration, Disciform scar, Subretinal hemorrhage, Pigmented atrophy    Vessels Normal    Periphery Normal           IMAGING AND PROCEDURES  Imaging and Procedures for 06/22/20  OCT, Retina - OU - Both Eyes       Right Eye Quality was good. Scan locations included subfoveal. Central Foveal Thickness: 277. Progression has improved. Findings include no SRF, choroidal neovascular membrane.   Left Eye Quality was good. Scan locations included subfoveal. Central Foveal Thickness: 232. Progression has been stable. Findings include retinal drusen , no SRF, no IRF.   Notes OD now with much less subretinal fluid and less intraretinal fluid treatment of CNVM  at 5-week interval with intravitreal Avastin repeat today                ASSESSMENT/PLAN:  Exudative age-related macular degeneration of right eye with active choroidal neovascularization (HCC) Repeat intravitreal Avastin OD today at 5-week interval, much less subretinal fluid and subretinal hemorrhage over the last 4 months      ICD-10-CM   1. Exudative age-related macular degeneration of right eye with active choroidal neovascularization (HCC)  H35.3211 OCT, Retina - OU - Both Eyes    1.  Macular anatomy OD is improving on intravitreal Avastin, currently at 5-week interval will repeat today   2.  Feet examination OD in 5 weeks  3.  Ophthalmic Meds Ordered this visit:  No orders of the defined types were placed in this encounter.      Return in about 5 weeks (around 07/27/2020) for dilate, OD, AVASTIN OCT.  There are no Patient Instructions on file for this visit.   Explained the diagnoses, plan, and follow up with the patient and they expressed understanding.  Patient expressed understanding of the importance of proper follow up care.   Clent Demark Nolawi Kanady M.D. Diseases & Surgery of the Retina and Vitreous Retina & Diabetic [place] 06/22/20     Abbreviations: M myopia (nearsighted); A astigmatism; H hyperopia (farsighted); P presbyopia; Mrx spectacle prescription;  CTL contact lenses; OD right eye; OS left eye; OU both eyes  XT exotropia; ET esotropia; PEK punctate epithelial keratitis; PEE punctate epithelial erosions; DES dry eye syndrome; MGD meibomian gland dysfunction; ATs artificial tears; PFAT's preservative free artificial tears; [place] nuclear sclerotic cataract; PSC posterior subcapsular  cataract; ERM epi-retinal membrane; PVD posterior vitreous detachment; RD retinal detachment; DM diabetes mellitus; DR diabetic retinopathy; NPDR non-proliferative diabetic retinopathy; PDR proliferative diabetic retinopathy; CSME clinically significant macular edema; DME  diabetic macular edema; dbh dot blot hemorrhages; CWS cotton wool spot; POAG primary open angle glaucoma; C/D cup-to-disc ratio; HVF humphrey visual field; GVF goldmann visual field; OCT optical coherence tomography; IOP intraocular pressure; BRVO Branch retinal vein occlusion; CRVO central retinal vein occlusion; CRAO central retinal artery occlusion; BRAO branch retinal artery occlusion; RT retinal tear; SB scleral buckle; PPV pars plana vitrectomy; VH Vitreous hemorrhage; PRP panretinal laser photocoagulation; IVK intravitreal kenalog; VMT vitreomacular traction; MH Macular hole;  NVD neovascularization of the disc; NVE neovascularization elsewhere; AREDS age related eye disease study; ARMD age related macular degeneration; POAG primary open angle glaucoma; EBMD epithelial/anterior basement membrane dystrophy; ACIOL anterior chamber intraocular lens; IOL intraocular lens; PCIOL posterior chamber intraocular lens; Phaco/IOL phacoemulsification with intraocular lens placement; [place] photorefractive keratectomy; LASIK laser assisted in situ keratomileusis; HTN hypertension; DM diabetes mellitus; COPD chronic obstructive pulmonary disease

## 2020-06-22 NOTE — Assessment & Plan Note (Signed)
Repeat intravitreal Avastin OD today at 5-week interval, much less subretinal fluid and subretinal hemorrhage over the last 4 months

## 2020-06-28 ENCOUNTER — Inpatient Hospital Stay: Payer: Medicare Other | Attending: Hematology & Oncology | Admitting: Hematology & Oncology

## 2020-06-28 ENCOUNTER — Inpatient Hospital Stay: Payer: Medicare Other

## 2020-06-28 ENCOUNTER — Other Ambulatory Visit: Payer: Self-pay

## 2020-06-28 ENCOUNTER — Encounter: Payer: Self-pay | Admitting: Hematology & Oncology

## 2020-06-28 VITALS — BP 96/55 | HR 85 | Temp 97.9°F | Resp 20 | Ht 69.0 in | Wt 164.0 lb

## 2020-06-28 VITALS — BP 129/60 | HR 74

## 2020-06-28 DIAGNOSIS — N189 Chronic kidney disease, unspecified: Secondary | ICD-10-CM | POA: Diagnosis not present

## 2020-06-28 DIAGNOSIS — D472 Monoclonal gammopathy: Secondary | ICD-10-CM

## 2020-06-28 DIAGNOSIS — R7989 Other specified abnormal findings of blood chemistry: Secondary | ICD-10-CM | POA: Insufficient documentation

## 2020-06-28 DIAGNOSIS — D500 Iron deficiency anemia secondary to blood loss (chronic): Secondary | ICD-10-CM

## 2020-06-28 DIAGNOSIS — D631 Anemia in chronic kidney disease: Secondary | ICD-10-CM | POA: Diagnosis not present

## 2020-06-28 DIAGNOSIS — D509 Iron deficiency anemia, unspecified: Secondary | ICD-10-CM | POA: Insufficient documentation

## 2020-06-28 DIAGNOSIS — I2510 Atherosclerotic heart disease of native coronary artery without angina pectoris: Secondary | ICD-10-CM

## 2020-06-28 LAB — CMP (CANCER CENTER ONLY)
ALT: 80 U/L — ABNORMAL HIGH (ref 0–44)
AST: 31 U/L (ref 15–41)
Albumin: 4.2 g/dL (ref 3.5–5.0)
Alkaline Phosphatase: 226 U/L — ABNORMAL HIGH (ref 38–126)
Anion gap: 9 (ref 5–15)
BUN: 29 mg/dL — ABNORMAL HIGH (ref 8–23)
CO2: 29 mmol/L (ref 22–32)
Calcium: 10.6 mg/dL — ABNORMAL HIGH (ref 8.9–10.3)
Chloride: 101 mmol/L (ref 98–111)
Creatinine: 1.67 mg/dL — ABNORMAL HIGH (ref 0.61–1.24)
GFR, Est AFR Am: 42 mL/min — ABNORMAL LOW (ref >60)
GFR, Estimated: 36 mL/min — ABNORMAL LOW (ref >60)
Glucose, Bld: 101 mg/dL — ABNORMAL HIGH (ref 70–99)
Potassium: 5 mmol/L (ref 3.5–5.1)
Sodium: 139 mmol/L (ref 135–145)
Total Bilirubin: 0.9 mg/dL (ref 0.3–1.2)
Total Protein: 8 g/dL (ref 6.5–8.1)

## 2020-06-28 LAB — CBC WITH DIFFERENTIAL (CANCER CENTER ONLY)
Abs Immature Granulocytes: 0.1 10*3/uL — ABNORMAL HIGH (ref 0.00–0.07)
Basophils Absolute: 0 10*3/uL (ref 0.0–0.1)
Basophils Relative: 0 %
Eosinophils Absolute: 0.1 10*3/uL (ref 0.0–0.5)
Eosinophils Relative: 1 %
HCT: 37.1 % — ABNORMAL LOW (ref 39.0–52.0)
Hemoglobin: 11.6 g/dL — ABNORMAL LOW (ref 13.0–17.0)
Immature Granulocytes: 1 %
Lymphocytes Relative: 17 %
Lymphs Abs: 1.3 10*3/uL (ref 0.7–4.0)
MCH: 32.3 pg (ref 26.0–34.0)
MCHC: 31.3 g/dL (ref 30.0–36.0)
MCV: 103.3 fL — ABNORMAL HIGH (ref 80.0–100.0)
Monocytes Absolute: 0.8 10*3/uL (ref 0.1–1.0)
Monocytes Relative: 9 %
Neutro Abs: 5.7 10*3/uL (ref 1.7–7.7)
Neutrophils Relative %: 72 %
Platelet Count: 211 10*3/uL (ref 150–400)
RBC: 3.59 MIL/uL — ABNORMAL LOW (ref 4.22–5.81)
RDW: 13.2 % (ref 11.5–15.5)
WBC Count: 8 10*3/uL (ref 4.0–10.5)
nRBC: 0 % (ref 0.0–0.2)

## 2020-06-28 MED ORDER — SODIUM CHLORIDE 0.9 % IV SOLN
INTRAVENOUS | Status: DC
  Filled 2020-06-28 (×2): qty 250

## 2020-06-28 NOTE — Progress Notes (Signed)
Hematology and Oncology Follow Up Visit  [name] [id number] Jun 07, 1929 87 y.o. 06/28/2020    Principle Diagnosis:  1. 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 Anemia secondary to chronic renal insufficiency 3. Intermittent iron - deficiency anemia  Current Therapy:   IV iron as indicated - last received in December 2017   Interim History: [name] is here today with his daughter for follow-up.  Unfortunately, he certainly appears to be different than when we saw him 6 months ago.  According to his daughter, he just sleeps all the time.  He did not eat anything yesterday.  I asked him why he did not eat anything and he really cannot give me an answer.  I am not sure as to what the real problem is.  He sees cardiology.  According to his daughter, he has been doing pretty well with respect to cardiology.  When we last saw him, we did his myeloma studies.  They were very stable.  The M spike was 0.5 g/dL.  His IgG level is 1060 mg/dL.  His lambda light chain was 12.1 mg/dL.  All this is really unchanged.  He has had no fever.  He has had the coronavirus vaccine.  He has had no problems with diarrhea.  He has had no leg swelling.  There is been no rashes.  He has had no headache.  There is been no change in his medications.   Overall, his performance status is ECOG 3.   Medications:  Allergies as of 06/28/2020   No Known Allergies     Medication List       Accurate as of June 28, 2020  1:49 PM. If you have any questions, ask your nurse or doctor.        apixaban 2.5 MG Tabs tablet Commonly known as: Eliquis Take 1 tablet (2.5 mg total) by mouth 2 (two) times daily.   atorvastatin 40 MG tablet Commonly known as: LIPITOR TAKE 1 TABLET (40 MG TOTAL) BY MOUTH DAILY. KEEP OV.   Chloramphenicol Palmitate Powd Place 3 puffs into both ears daily.   Citrucel 500 MG Tabs Generic drug: Methylcellulose (Laxative) Take 1,000 mg by mouth at  bedtime.   [employer] SENIOR PROBIOTIC PO Take 1 capsule by mouth at bedtime. Once daily   digoxin 0.125 MG tablet Commonly known as: LANOXIN Take 0.0625 mg by mouth daily.   diphenhydramine-acetaminophen 25-500 MG Tabs tablet Commonly known as: TYLENOL PM Take 2 tablets by mouth at bedtime.   Ensure Take 1 Can by mouth daily.   furosemide 40 MG tablet Commonly known as: LASIX TAKE 1.5 TABLETS (60 MG TOTAL) BY MOUTH DAILY. KEEP OV.   metoprolol tartrate 50 MG tablet Commonly known as: LOPRESSOR Take 1.5 tablets (75 mg total) by mouth 2 (two) times daily.   midodrine 2.5 MG tablet Commonly known as: PROAMATINE Take 1 tablet (2.5 mg total) by mouth 2 (two) times daily with a meal.   niacin 500 MG CR tablet Commonly known as: NIASPAN TAKE 1 TABLET BY MOUTH AT BEDTIME. MUST MAKE OFFICE APPOINTMENT   ProAir HFA 108 (90 Base) MCG/ACT inhaler Generic drug: albuterol INHALE 2 PUFFS INTO THE  LUNGS EVERY 4 HOURS AS  NEEDED FOR WHEEZING OR  SHORTNESS OF BREATH   Restasis 0.05 % ophthalmic emulsion Generic drug: cycloSPORINE Place 1 drop into both eyes in the morning and at bedtime.   Simbrinza 1-0.2 % Susp Generic drug: Brinzolamide-Brimonidine Apply 1 drop to eye 2 (two) times  daily.   tamsulosin 0.4 MG Caps capsule Commonly known as: FLOMAX Take 0.4 mg by mouth at bedtime.   Yupelri 175 MCG/3ML nebulizer solution Generic drug: revefenacin Inhale 3 mLs (175 mcg total) into the lungs daily.       Allergies: No Known Allergies  Past Medical History, Surgical history, Social history, and Family History were reviewed and updated.  Review of Systems: Review of Systems  Constitutional: Negative.   HENT: Negative.   Eyes: Negative.   Respiratory: Negative.   Cardiovascular: Negative.   Gastrointestinal: Negative.   Genitourinary: Negative.   Musculoskeletal: Negative.   Skin: Negative.   Neurological: Negative.   Endo/Heme/Allergies: Negative.     Psychiatric/Behavioral: Negative.       Physical Exam:  height is 5\' 9"  (1.753 m) and weight is 164 lb (74.4 kg). His oral temperature is 97.9 F (36.6 C). His blood pressure is 96/55 (abnormal) and his pulse is 85. His respiration is 20 and oxygen saturation is 100%.   Wt Readings from Last 3 Encounters:  06/28/20 164 lb (74.4 kg)  04/06/20 173 lb 9.6 oz (78.7 kg)  02/15/20 173 lb 6.4 oz (78.7 kg)    Physical Exam Vitals reviewed.  HENT:     Head: Normocephalic and atraumatic.  Eyes:     Pupils: Pupils are equal, round, and reactive to light.  Cardiovascular:     Rate and Rhythm: Normal rate and regular rhythm.     Heart sounds: Normal heart sounds.  Pulmonary:     Effort: Pulmonary effort is normal.     Breath sounds: Normal breath sounds.  Abdominal:     General: Bowel sounds are normal.     Palpations: Abdomen is soft.  Musculoskeletal:        General: No tenderness or deformity. Normal range of motion.     Cervical back: Normal range of motion.  Lymphadenopathy:     Cervical: No cervical adenopathy.  Skin:    General: Skin is warm and dry.     Findings: No erythema or rash.  Neurological:     Mental Status: He is alert and oriented to person, place, and time.  Psychiatric:        Behavior: Behavior normal.        Thought Content: Thought content normal.        Judgment: Judgment normal.      Lab Results  Component Value Date   WBC 8.0 06/28/2020   HGB 11.6 (L) 06/28/2020   HCT 37.1 (L) 06/28/2020   MCV 103.3 (H) 06/28/2020   PLT 211 06/28/2020   Lab Results  Component Value Date   FERRITIN 965 (H) 12/30/2019   IRON 61 12/30/2019   TIBC 265 12/30/2019   UIBC 204 12/30/2019   IRONPCTSAT 23 12/30/2019   Lab Results  Component Value Date   RETICCTPCT 1.8 12/30/2019   RBC 3.59 (L) 06/28/2020   RETICCTABS 33.2 11/09/2015   Lab Results  Component Value Date   KPAFRELGTCHN 46.5 (H) 12/30/2019   LAMBDASER 120.9 (H) 12/30/2019   KAPLAMBRATIO 0.38  12/30/2019   Lab Results  Component Value Date   IGGSERUM 1,060 12/30/2019   IGA 131 12/30/2019   IGMSERUM 387 (H) 12/30/2019   Lab Results  Component Value Date   TOTALPROTELP 7.1 12/30/2019   ALBUMINELP 3.6 12/30/2019   A1GS 0.3 12/30/2019   A2GS 0.9 12/30/2019   BETS 1.1 12/30/2019   BETA2SER 0.4 11/09/2015   GAMS 1.2 12/30/2019   MSPIKE 0.5 (H) 12/30/2019  SPEI Comment 08/13/2018     Chemistry      Component Value Date/Time   NA 139 06/28/2020 1257   NA 146 (H) 12/11/2017 1322   NA 141 06/05/2017 1051   K 5.0 06/28/2020 1257   K 4.5 12/11/2017 1322   K 3.9 06/05/2017 1051   CL 101 06/28/2020 1257   CL 102 12/11/2017 1322   CO2 29 06/28/2020 1257   CO2 31 12/11/2017 1322   CO2 25 06/05/2017 1051   BUN 29 (H) 06/28/2020 1257   BUN 21 12/11/2017 1322   BUN 15.6 06/05/2017 1051   CREATININE 1.67 (H) 06/28/2020 1257   CREATININE 1.4 (H) 12/11/2017 1322   CREATININE 1.3 06/05/2017 1051      Component Value Date/Time   CALCIUM 10.6 (H) 06/28/2020 1257   CALCIUM 9.6 12/11/2017 1322   CALCIUM 9.6 06/05/2017 1051   ALKPHOS 226 (H) 06/28/2020 1257   ALKPHOS 74 12/11/2017 1322   ALKPHOS 77 06/05/2017 1051   AST 31 06/28/2020 1257   AST 33 06/05/2017 1051   ALT 80 (H) 06/28/2020 1257   ALT 43 12/11/2017 1322   ALT 32 06/05/2017 1051   BILITOT 0.9 06/28/2020 1257   BILITOT 0.90 06/05/2017 1051      Impression and Plan: Ms. Sitar is a very pleasant 86 yo caucasian gentleman with history of anemia of iron deficiency and chronic renal insufficiency.   I I am a little bit bothered by the fact that he does look different.  I just am not sure as to why.  I will have to see what is myeloma studies show.  I do not see nothing that looks like heart failure.  This is just very unsettling to me.  We are going to have to get him back much sooner than 6 months to see what is going on.  I am not sure why his liver function studies are elevated.  The last had his  ultrasound of the liver about 4 years ago.  I suppose we probably could consider another ultrasound to see if there is any changes.  I would like to see him back in 2 months.  [name], MD 6/30/20211:49 PM

## 2020-06-28 NOTE — Patient Instructions (Signed)

## 2020-06-29 LAB — IRON AND TIBC
Iron: 59 ug/dL (ref 42–163)
Saturation Ratios: 21 % (ref 20–55)
TIBC: 278 ug/dL (ref 202–409)
UIBC: 219 ug/dL (ref 117–376)

## 2020-06-29 LAB — KAPPA/LAMBDA LIGHT CHAINS
Kappa free light chain: 50.3 mg/L — ABNORMAL HIGH (ref 3.3–19.4)
Kappa, lambda light chain ratio: 0.39 (ref 0.26–1.65)
Lambda free light chains: 128.5 mg/L — ABNORMAL HIGH (ref 5.7–26.3)

## 2020-06-29 LAB — FERRITIN: Ferritin: 2162 ng/mL — ABNORMAL HIGH (ref 24–336)

## 2020-06-29 LAB — IGG, IGA, IGM
IgA: 128 mg/dL (ref 61–437)
IgG (Immunoglobin G), Serum: 914 mg/dL (ref 603–1613)
IgM (Immunoglobulin M), Srm: 372 mg/dL — ABNORMAL HIGH (ref 15–143)

## 2020-07-05 ENCOUNTER — Other Ambulatory Visit: Payer: Self-pay | Admitting: Cardiovascular Disease

## 2020-07-07 ENCOUNTER — Ambulatory Visit (HOSPITAL_BASED_OUTPATIENT_CLINIC_OR_DEPARTMENT_OTHER)
Admission: RE | Admit: 2020-07-07 | Discharge: 2020-07-07 | Disposition: A | Discharge status: Home / Self Care | Payer: Medicare Other | Source: Ambulatory Visit | Attending: Hematology & Oncology | Admitting: Hematology & Oncology

## 2020-07-07 ENCOUNTER — Other Ambulatory Visit: Payer: Self-pay

## 2020-07-07 DIAGNOSIS — D472 Monoclonal gammopathy: Secondary | ICD-10-CM | POA: Diagnosis not present

## 2020-07-07 DIAGNOSIS — K7689 Other specified diseases of liver: Secondary | ICD-10-CM | POA: Diagnosis not present

## 2020-07-07 DIAGNOSIS — N281 Cyst of kidney, acquired: Secondary | ICD-10-CM | POA: Diagnosis not present

## 2020-07-07 DIAGNOSIS — N200 Calculus of kidney: Secondary | ICD-10-CM | POA: Diagnosis not present

## 2020-07-07 DIAGNOSIS — K828 Other specified diseases of gallbladder: Secondary | ICD-10-CM | POA: Diagnosis not present

## 2020-07-10 ENCOUNTER — Inpatient Hospital Stay (HOSPITAL_COMMUNITY)
Admission: EM | Admit: 2020-07-10 | Discharge: 2020-07-17 | DRG: 392 | Disposition: A | Discharge status: Skilled Nursing Facility | Payer: Medicare Other | Attending: Internal Medicine | Admitting: Internal Medicine

## 2020-07-10 ENCOUNTER — Encounter (HOSPITAL_COMMUNITY): Payer: Self-pay

## 2020-07-10 ENCOUNTER — Telehealth: Payer: Self-pay

## 2020-07-10 DIAGNOSIS — R748 Abnormal levels of other serum enzymes: Secondary | ICD-10-CM | POA: Diagnosis not present

## 2020-07-10 DIAGNOSIS — Z951 Presence of aortocoronary bypass graft: Secondary | ICD-10-CM

## 2020-07-10 DIAGNOSIS — J449 Chronic obstructive pulmonary disease, unspecified: Secondary | ICD-10-CM | POA: Diagnosis present

## 2020-07-10 DIAGNOSIS — R531 Weakness: Secondary | ICD-10-CM

## 2020-07-10 DIAGNOSIS — Z66 Do not resuscitate: Secondary | ICD-10-CM | POA: Diagnosis not present

## 2020-07-10 DIAGNOSIS — R1031 Right lower quadrant pain: Secondary | ICD-10-CM | POA: Diagnosis not present

## 2020-07-10 DIAGNOSIS — R1011 Right upper quadrant pain: Secondary | ICD-10-CM

## 2020-07-10 DIAGNOSIS — Z515 Encounter for palliative care: Secondary | ICD-10-CM | POA: Diagnosis not present

## 2020-07-10 DIAGNOSIS — I4821 Permanent atrial fibrillation: Secondary | ICD-10-CM | POA: Diagnosis not present

## 2020-07-10 DIAGNOSIS — Z825 Family history of asthma and other chronic lower respiratory diseases: Secondary | ICD-10-CM

## 2020-07-10 DIAGNOSIS — N202 Calculus of kidney with calculus of ureter: Secondary | ICD-10-CM | POA: Diagnosis present

## 2020-07-10 DIAGNOSIS — R1010 Upper abdominal pain, unspecified: Secondary | ICD-10-CM | POA: Diagnosis not present

## 2020-07-10 DIAGNOSIS — Z7901 Long term (current) use of anticoagulants: Secondary | ICD-10-CM

## 2020-07-10 DIAGNOSIS — I5032 Chronic diastolic (congestive) heart failure: Secondary | ICD-10-CM | POA: Diagnosis present

## 2020-07-10 DIAGNOSIS — H9193 Unspecified hearing loss, bilateral: Secondary | ICD-10-CM | POA: Diagnosis present

## 2020-07-10 DIAGNOSIS — G8929 Other chronic pain: Secondary | ICD-10-CM | POA: Diagnosis present

## 2020-07-10 DIAGNOSIS — N200 Calculus of kidney: Secondary | ICD-10-CM | POA: Diagnosis not present

## 2020-07-10 DIAGNOSIS — R1084 Generalized abdominal pain: Secondary | ICD-10-CM | POA: Diagnosis not present

## 2020-07-10 DIAGNOSIS — E785 Hyperlipidemia, unspecified: Secondary | ICD-10-CM | POA: Diagnosis present

## 2020-07-10 DIAGNOSIS — Z87891 Personal history of nicotine dependence: Secondary | ICD-10-CM

## 2020-07-10 DIAGNOSIS — Z950 Presence of cardiac pacemaker: Secondary | ICD-10-CM

## 2020-07-10 DIAGNOSIS — K6289 Other specified diseases of anus and rectum: Secondary | ICD-10-CM | POA: Diagnosis present

## 2020-07-10 DIAGNOSIS — N183 Chronic kidney disease, stage 3 unspecified: Secondary | ICD-10-CM | POA: Diagnosis present

## 2020-07-10 DIAGNOSIS — I672 Cerebral atherosclerosis: Secondary | ICD-10-CM | POA: Diagnosis present

## 2020-07-10 DIAGNOSIS — Z20822 Contact with and (suspected) exposure to covid-19: Secondary | ICD-10-CM | POA: Diagnosis present

## 2020-07-10 DIAGNOSIS — D539 Nutritional anemia, unspecified: Secondary | ICD-10-CM | POA: Diagnosis present

## 2020-07-10 DIAGNOSIS — Z8249 Family history of ischemic heart disease and other diseases of the circulatory system: Secondary | ICD-10-CM

## 2020-07-10 DIAGNOSIS — Z79899 Other long term (current) drug therapy: Secondary | ICD-10-CM

## 2020-07-10 DIAGNOSIS — I130 Hypertensive heart and chronic kidney disease with heart failure and stage 1 through stage 4 chronic kidney disease, or unspecified chronic kidney disease: Secondary | ICD-10-CM | POA: Diagnosis not present

## 2020-07-10 DIAGNOSIS — I2510 Atherosclerotic heart disease of native coronary artery without angina pectoris: Secondary | ICD-10-CM | POA: Diagnosis present

## 2020-07-10 DIAGNOSIS — R627 Adult failure to thrive: Secondary | ICD-10-CM | POA: Diagnosis not present

## 2020-07-10 DIAGNOSIS — R5381 Other malaise: Secondary | ICD-10-CM | POA: Diagnosis not present

## 2020-07-10 DIAGNOSIS — K766 Portal hypertension: Secondary | ICD-10-CM | POA: Diagnosis present

## 2020-07-10 DIAGNOSIS — I700 Atherosclerosis of aorta: Secondary | ICD-10-CM | POA: Diagnosis not present

## 2020-07-10 DIAGNOSIS — N210 Calculus in bladder: Secondary | ICD-10-CM | POA: Diagnosis not present

## 2020-07-10 DIAGNOSIS — D472 Monoclonal gammopathy: Secondary | ICD-10-CM | POA: Diagnosis present

## 2020-07-10 DIAGNOSIS — N323 Diverticulum of bladder: Secondary | ICD-10-CM | POA: Diagnosis not present

## 2020-07-10 DIAGNOSIS — Z7189 Other specified counseling: Secondary | ICD-10-CM

## 2020-07-10 DIAGNOSIS — R109 Unspecified abdominal pain: Secondary | ICD-10-CM | POA: Diagnosis present

## 2020-07-10 DIAGNOSIS — I4892 Unspecified atrial flutter: Secondary | ICD-10-CM | POA: Diagnosis present

## 2020-07-10 DIAGNOSIS — E860 Dehydration: Secondary | ICD-10-CM | POA: Diagnosis present

## 2020-07-10 DIAGNOSIS — Z87442 Personal history of urinary calculi: Secondary | ICD-10-CM

## 2020-07-10 DIAGNOSIS — Z831 Family history of other infectious and parasitic diseases: Secondary | ICD-10-CM

## 2020-07-10 DIAGNOSIS — F329 Major depressive disorder, single episode, unspecified: Secondary | ICD-10-CM | POA: Diagnosis present

## 2020-07-10 DIAGNOSIS — I959 Hypotension, unspecified: Secondary | ICD-10-CM | POA: Diagnosis not present

## 2020-07-10 DIAGNOSIS — I495 Sick sinus syndrome: Secondary | ICD-10-CM | POA: Diagnosis present

## 2020-07-10 DIAGNOSIS — K5900 Constipation, unspecified: Secondary | ICD-10-CM | POA: Diagnosis present

## 2020-07-10 DIAGNOSIS — D638 Anemia in other chronic diseases classified elsewhere: Secondary | ICD-10-CM | POA: Diagnosis present

## 2020-07-10 DIAGNOSIS — R0789 Other chest pain: Secondary | ICD-10-CM | POA: Diagnosis not present

## 2020-07-10 DIAGNOSIS — K7460 Unspecified cirrhosis of liver: Secondary | ICD-10-CM | POA: Diagnosis present

## 2020-07-10 DIAGNOSIS — K824 Cholesterolosis of gallbladder: Secondary | ICD-10-CM | POA: Diagnosis present

## 2020-07-10 DIAGNOSIS — K8020 Calculus of gallbladder without cholecystitis without obstruction: Secondary | ICD-10-CM | POA: Diagnosis not present

## 2020-07-10 DIAGNOSIS — N179 Acute kidney failure, unspecified: Secondary | ICD-10-CM | POA: Diagnosis present

## 2020-07-10 DIAGNOSIS — N1831 Chronic kidney disease, stage 3a: Secondary | ICD-10-CM | POA: Diagnosis present

## 2020-07-10 LAB — CBC
HCT: 32.7 % — ABNORMAL LOW (ref 39.0–52.0)
Hemoglobin: 10.7 g/dL — ABNORMAL LOW (ref 13.0–17.0)
MCH: 33.1 pg (ref 26.0–34.0)
MCHC: 32.7 g/dL (ref 30.0–36.0)
MCV: 101.2 fL — ABNORMAL HIGH (ref 80.0–100.0)
Platelets: 160 10*3/uL (ref 150–400)
RBC: 3.23 MIL/uL — ABNORMAL LOW (ref 4.22–5.81)
RDW: 14 % (ref 11.5–15.5)
WBC: 4.7 10*3/uL (ref 4.0–10.5)
nRBC: 0 % (ref 0.0–0.2)

## 2020-07-10 LAB — COMPREHENSIVE METABOLIC PANEL
ALT: 60 U/L — ABNORMAL HIGH (ref 0–44)
AST: 56 U/L — ABNORMAL HIGH (ref 15–41)
Albumin: 3.4 g/dL — ABNORMAL LOW (ref 3.5–5.0)
Alkaline Phosphatase: 152 U/L — ABNORMAL HIGH (ref 38–126)
Anion gap: 10 (ref 5–15)
BUN: 21 mg/dL (ref 8–23)
CO2: 24 mmol/L (ref 22–32)
Calcium: 9.3 mg/dL (ref 8.9–10.3)
Chloride: 105 mmol/L (ref 98–111)
Creatinine, Ser: 1.28 mg/dL — ABNORMAL HIGH (ref 0.61–1.24)
GFR calc Af Amer: 58 mL/min — ABNORMAL LOW (ref >60)
GFR calc non Af Amer: 50 mL/min — ABNORMAL LOW (ref >60)
Glucose, Bld: 105 mg/dL — ABNORMAL HIGH (ref 70–99)
Potassium: 4.4 mmol/L (ref 3.5–5.1)
Sodium: 139 mmol/L (ref 135–145)
Total Bilirubin: 0.8 mg/dL (ref 0.3–1.2)
Total Protein: 6.4 g/dL — ABNORMAL LOW (ref 6.5–8.1)

## 2020-07-10 LAB — LIPASE, BLOOD: Lipase: 70 U/L — ABNORMAL HIGH (ref 11–51)

## 2020-07-10 LAB — URINALYSIS, ROUTINE W REFLEX MICROSCOPIC
Bacteria, UA: NONE SEEN
Bilirubin Urine: NEGATIVE
Glucose, UA: NEGATIVE mg/dL
Hgb urine dipstick: NEGATIVE
Ketones, ur: NEGATIVE mg/dL
Nitrite: NEGATIVE
Protein, ur: NEGATIVE mg/dL
Specific Gravity, Urine: 1.02 (ref 1.005–1.030)
pH: 5 (ref 5.0–8.0)

## 2020-07-10 NOTE — ED Triage Notes (Signed)
Pt from [employer] from [facility]. Pt c.o upper abd pain. Confirmed gallstones. Refused pain meds with EMS. Pain only when he moves. 18G RAC

## 2020-07-10 NOTE — Telephone Encounter (Addendum)
Patient's daughter [name] notified of results. Report sent to PCP. [name] will contact their office for next steps. dph  ----- Message from [name], MD sent at 07/10/2020  8:01 AM EDT ----- Call his daughter -- he has cirrhosis and gallstones.  Need to speak with family MD as to what to do next -- make sure his family MD gets this report!!  Thanks!  Laurey Arrow

## 2020-07-11 ENCOUNTER — Emergency Department (HOSPITAL_COMMUNITY): Payer: Medicare Other

## 2020-07-11 DIAGNOSIS — R1031 Right lower quadrant pain: Secondary | ICD-10-CM | POA: Diagnosis present

## 2020-07-11 DIAGNOSIS — K8020 Calculus of gallbladder without cholecystitis without obstruction: Secondary | ICD-10-CM | POA: Diagnosis not present

## 2020-07-11 DIAGNOSIS — I700 Atherosclerosis of aorta: Secondary | ICD-10-CM | POA: Diagnosis not present

## 2020-07-11 DIAGNOSIS — K824 Cholesterolosis of gallbladder: Secondary | ICD-10-CM | POA: Diagnosis present

## 2020-07-11 DIAGNOSIS — N210 Calculus in bladder: Secondary | ICD-10-CM | POA: Diagnosis not present

## 2020-07-11 DIAGNOSIS — N323 Diverticulum of bladder: Secondary | ICD-10-CM | POA: Diagnosis not present

## 2020-07-11 DIAGNOSIS — R1011 Right upper quadrant pain: Secondary | ICD-10-CM | POA: Diagnosis not present

## 2020-07-11 DIAGNOSIS — N200 Calculus of kidney: Secondary | ICD-10-CM | POA: Diagnosis not present

## 2020-07-11 DIAGNOSIS — R627 Adult failure to thrive: Secondary | ICD-10-CM

## 2020-07-11 LAB — DIGOXIN LEVEL: Digoxin Level: 0.7 ng/mL — ABNORMAL LOW (ref 0.8–2.0)

## 2020-07-11 LAB — URIC ACID: Uric Acid, Serum: 9.1 mg/dL — ABNORMAL HIGH (ref 3.7–8.6)

## 2020-07-11 LAB — SARS CORONAVIRUS 2 (TAT 6-24 HRS): SARS Coronavirus 2: NEGATIVE

## 2020-07-11 MED ORDER — REVEFENACIN 175 MCG/3ML IN SOLN
175.0000 ug | Freq: Every day | RESPIRATORY_TRACT | Status: DC
  Administered 2020-07-12 – 2020-07-17 (×5): 175 ug via RESPIRATORY_TRACT
  Filled 2020-07-11 (×7): qty 3

## 2020-07-11 MED ORDER — ACETAMINOPHEN 650 MG RE SUPP: 650.0000 mg | Freq: Four times a day (QID) | RECTAL | Status: DC | PRN

## 2020-07-11 MED ORDER — REVEFENACIN 175 MCG/3ML IN SOLN: 175.0000 ug | Freq: Every day | RESPIRATORY_TRACT | Status: DC

## 2020-07-11 MED ORDER — METOPROLOL TARTRATE 50 MG PO TABS
75.0000 mg | ORAL_TABLET | Freq: Two times a day (BID) | ORAL | Status: DC
  Administered 2020-07-11 – 2020-07-17 (×13): 75 mg via ORAL
  Filled 2020-07-11 (×7): qty 1
  Filled 2020-07-11: qty 3
  Filled 2020-07-11 (×5): qty 1

## 2020-07-11 MED ORDER — BRINZOLAMIDE 1 % OP SUSP
1.0000 [drp] | Freq: Three times a day (TID) | OPHTHALMIC | Status: DC
  Administered 2020-07-11 – 2020-07-17 (×18): 1 [drp] via OPHTHALMIC
  Filled 2020-07-11: qty 10

## 2020-07-11 MED ORDER — ONDANSETRON HCL 4 MG PO TABS: 4.0000 mg | ORAL_TABLET | Freq: Four times a day (QID) | ORAL | Status: DC | PRN

## 2020-07-11 MED ORDER — ACETAMINOPHEN 325 MG PO TABS
650.0000 mg | ORAL_TABLET | Freq: Four times a day (QID) | ORAL | Status: DC | PRN
  Administered 2020-07-13 – 2020-07-14 (×2): 650 mg via ORAL
  Filled 2020-07-11 (×2): qty 2

## 2020-07-11 MED ORDER — DIGOXIN 0.0625 MG HALF TABLET
0.0625 mg | ORAL_TABLET | Freq: Every day | ORAL | Status: DC
  Administered 2020-07-11 – 2020-07-17 (×7): 0.0625 mg via ORAL
  Filled 2020-07-11 (×8): qty 1

## 2020-07-11 MED ORDER — PSYLLIUM 95 % PO PACK
1.0000 | PACK | Freq: Every day | ORAL | Status: DC
  Administered 2020-07-11: 1 via ORAL
  Filled 2020-07-11 (×2): qty 1

## 2020-07-11 MED ORDER — HYDROMORPHONE HCL 1 MG/ML IJ SOLN
0.5000 mg | Freq: Once | INTRAMUSCULAR | Status: AC
  Administered 2020-07-11: 0.5 mg via INTRAVENOUS
  Filled 2020-07-11: qty 1

## 2020-07-11 MED ORDER — ONDANSETRON HCL 4 MG/2ML IJ SOLN
4.0000 mg | Freq: Four times a day (QID) | INTRAMUSCULAR | Status: DC | PRN
  Administered 2020-07-12 – 2020-07-13 (×2): 4 mg via INTRAVENOUS
  Filled 2020-07-11 (×2): qty 2

## 2020-07-11 MED ORDER — MIDODRINE HCL 5 MG PO TABS
2.5000 mg | ORAL_TABLET | Freq: Two times a day (BID) | ORAL | Status: DC
  Administered 2020-07-11 – 2020-07-17 (×11): 2.5 mg via ORAL
  Filled 2020-07-11 (×11): qty 1

## 2020-07-11 MED ORDER — BRIMONIDINE TARTRATE 0.2 % OP SOLN
1.0000 [drp] | Freq: Three times a day (TID) | OPHTHALMIC | Status: DC
  Administered 2020-07-11 – 2020-07-17 (×17): 1 [drp] via OPHTHALMIC
  Filled 2020-07-11 (×3): qty 5

## 2020-07-11 MED ORDER — NIACIN ER (ANTIHYPERLIPIDEMIC) 500 MG PO TBCR
500.0000 mg | EXTENDED_RELEASE_TABLET | Freq: Every day | ORAL | Status: DC
  Administered 2020-07-11 – 2020-07-16 (×6): 500 mg via ORAL
  Filled 2020-07-11 (×7): qty 1

## 2020-07-11 MED ORDER — ATORVASTATIN CALCIUM 40 MG PO TABS
40.0000 mg | ORAL_TABLET | Freq: Every day | ORAL | Status: DC
  Administered 2020-07-11 – 2020-07-17 (×7): 40 mg via ORAL
  Filled 2020-07-11 (×7): qty 1

## 2020-07-11 MED ORDER — ONDANSETRON HCL 4 MG/2ML IJ SOLN
4.0000 mg | Freq: Once | INTRAMUSCULAR | Status: AC
  Administered 2020-07-11: 4 mg via INTRAVENOUS
  Filled 2020-07-11: qty 2

## 2020-07-11 MED ORDER — APIXABAN 2.5 MG PO TABS
2.5000 mg | ORAL_TABLET | Freq: Two times a day (BID) | ORAL | Status: DC
  Administered 2020-07-11 – 2020-07-12 (×3): 2.5 mg via ORAL
  Filled 2020-07-11 (×4): qty 1

## 2020-07-11 MED ORDER — FUROSEMIDE 40 MG PO TABS
60.0000 mg | ORAL_TABLET | Freq: Every day | ORAL | Status: DC
  Administered 2020-07-11 – 2020-07-12 (×2): 60 mg via ORAL
  Filled 2020-07-11: qty 1
  Filled 2020-07-11: qty 3

## 2020-07-11 MED ORDER — RISAQUAD PO CAPS
1.0000 | ORAL_CAPSULE | Freq: Every day | ORAL | Status: DC
  Administered 2020-07-11 – 2020-07-16 (×6): 1 via ORAL
  Filled 2020-07-11 (×7): qty 1

## 2020-07-11 MED ORDER — IOHEXOL 350 MG/ML SOLN
100.0000 mL | Freq: Once | INTRAVENOUS | Status: AC | PRN
  Administered 2020-07-11: 100 mL via INTRAVENOUS

## 2020-07-11 MED ORDER — ALBUTEROL SULFATE (2.5 MG/3ML) 0.083% IN NEBU: 2.5000 mg | INHALATION_SOLUTION | RESPIRATORY_TRACT | Status: DC | PRN

## 2020-07-11 MED ORDER — CYCLOSPORINE 0.05 % OP EMUL
1.0000 [drp] | Freq: Two times a day (BID) | OPHTHALMIC | Status: DC
  Administered 2020-07-11 – 2020-07-17 (×12): 1 [drp] via OPHTHALMIC
  Filled 2020-07-11 (×14): qty 1

## 2020-07-11 MED ORDER — TAMSULOSIN HCL 0.4 MG PO CAPS
0.4000 mg | ORAL_CAPSULE | Freq: Every day | ORAL | Status: DC
  Administered 2020-07-11 – 2020-07-16 (×6): 0.4 mg via ORAL
  Filled 2020-07-11 (×6): qty 1

## 2020-07-11 NOTE — ED Notes (Signed)
[name] [id number] would like an update on patient when he is in the room

## 2020-07-11 NOTE — ED Provider Notes (Signed)
[facility] EMERGENCY DEPARTMENT Provider Note   CSN: [id number] Arrival date & time: 07/10/20  1636     History Chief Complaint  Patient presents with  . Abdominal Pain    [name] is a 86 y.o. male.  86yo male with history of gallstones as well as history of monoclonal gammapathy and PAF (on Eliquis) presents with vomiting and abdominal pain. Patient called EMS Thursday due to abdominal pain however refused transport when he was told he would not be brought to this ED due to extensive wait. Patient followed up with his oncologist on Friday who was concerned for apparent decline in health and elevated liver enzymes and ordered an ultrasound which showed gallstones. Patient last ate a egg salad sandwich this afternoon, has had dry heaving since then with complaint of pain in the RUQ that radiates to the right back, pain is intermittent.  Patient reports SHOB and CP which are chronic in nature, related to his COPD, no changes in his chronic symptoms however family does report progressively more weak. Patient's daughter at bedside, in town from [place], patient does live with his other daughter locally.         Past Medical History:  Diagnosis Date  . ACS (acute coronary syndrome) ([place])   . Acute respiratory failure with hypoxia ([place]) 05/2017  . Anemia of renal disease 12/05/2011  . Anemia, iron deficiency 12/05/2011  . Asthma   . CAS (cerebral atherosclerosis)    Carotid Dopplers, 04/09/2012 - Bilateral Bulb/Proximal ICAs-mild to moderate amount of fibrous plaque of fibrous soft plaque w/o evidence of a significant diameter reduction, dissection, or any other vascular abnormality  . COPD (chronic obstructive pulmonary disease) ([place])   . Coronary heart disease    a. s/p CABG in 2006 with LIMA-LAD, SVG-Cx, SVG-IM-OM b. low-risk NST in 08/2016  . Dysrhythmia 11/09/2013   SVT VS A FLUTTER   . Kidney stones   . MGUS (monoclonal gammopathy of unknown significance)  12/05/2011  . Orthostatic hypotension 09/10/2016  . PAF (paroxysmal atrial fibrillation) ([facility])    a. on Eliquis  . Shortness of breath   . SSS (sick sinus syndrome) ([place])    a. s/p PPM placement in 2015    Patient Active Problem List   Diagnosis Date Noted  . Exudative age-related macular degeneration of right eye with active choroidal neovascularization ([place]) 05/18/2020  . Retinal hemorrhage, right 05/18/2020  . 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 Acute on chronic right-sided heart failure ([place])   . Pulmonary hypertension ([place])   . Anemia of chronic disease 06/15/2017  . CKD (chronic kidney disease) stage 3, GFR 30-59 ml/min 06/15/2017  . Acute coronary syndromes ([place]) 06/15/2017  . Calculus of gallbladder without cholecystitis without obstruction 02/13/2017  . CAD (coronary artery disease) 01/23/2017  . Sepsis secondary to UTI ([place])   . Bacteremia due to Enterobacter species   . Monoclonal gammopathy of unknown significance (MGUS)   . Acute renal failure with acute tubular necrosis superimposed on chronic kidney disease ([place])   . Other hyperlipidemia   . UTI (urinary tract infection) 12/01/2016  . Lactic acidosis 12/01/2016  . Acute respiratory failure with hypoxia ([place]) 12/01/2016  . Acute-on-chronic kidney injury ([place]) 12/01/2016  . Sepsis ([place]) 12/01/2016  . Orthostatic hypotension 09/10/2016  . Chronic anticoagulation 04/23/2016  . Hyperlipidemia 05/09/2014  . AVB (atrioventricular block)- high grade second degree 02/05/2014  . Pacemaker- MDT 02/04/14 02/04/2014  . S/P CABG  x 4 11/09/2013  . Nonspecific ST-T wave electrocardiographic changes   . COPD (chronic obstructive pulmonary disease) ([place])   . Paroxysmal atrial flutter ([place]) 08/15/2013  . Syncope 06/24/2013  . MGUS (monoclonal gammopathy of unknown significance) 12/05/2011  . Anemia of renal disease 12/05/2011  . Anemia, iron deficiency  12/05/2011  . ALLERGIC RHINITIS 04/06/2008  . EMPHYSEMA 03/11/2008    Past Surgical History:  Procedure Laterality Date  . CARDIAC CATHETERIZATION  12/05/2005   Recommended CABG  . CARDIOVERSION N/A 11/09/2013   Procedure: CARDIOVERSION;  Surgeon: Pixie Casino, MD;  Location[facility] ENDOSCOPY;  Service: Cardiovascular;  Laterality: N/A;  . CATARACT EXTRACTION    . CORONARY ARTERY BYPASS GRAFT  12/06/2005   x4, LIMA to LAD, vein to distal circumflex, sequential vein to intermediate and obtuse marginal vessel  . INNER EAR SURGERY    . KIDNEY STONE SURGERY    . LOOP RECORDER IMPLANT N/A 11/30/2013   Procedure: LOOP RECORDER IMPLANT;  Surgeon: Sanda Klein, MD;  Location: [facility] CATH LAB;  Service: Cardiovascular;  Laterality: N/A;  . PERMANENT PACEMAKER INSERTION N/A 02/04/2014   Procedure: PERMANENT PACEMAKER INSERTION;  Surgeon: Sanda Klein, MD;  Location: [facility] CATH LAB;  Service: Cardiovascular;  Laterality: N/A;  . TEE WITHOUT CARDIOVERSION N/A 11/09/2013   Procedure: TRANSESOPHAGEAL ECHOCARDIOGRAM (TEE);  Surgeon: Pixie Casino, MD;  Location: [facility] ENDOSCOPY;  Service: Cardiovascular;  Laterality: N/A;       Family History  Problem Relation Age of Onset  . Tuberculosis Father   . Heart attack Father   . Heart disease Mother   . Heart attack Mother   . Heart disease Brother   . Emphysema Daughter        premature without tobacco exposure    Social History   Tobacco Use  . Smoking status: Former Smoker    Packs/day: 1.00    Years: 47.00    Pack years: 47.00    Types: Cigarettes    Start date: 12/02/1951    Quit date: 12/30/1993    Years since quitting: 26.5  . Smokeless tobacco: Never Used  . Tobacco comment: quit 20 years ago  Vaping Use  . Vaping Use: Never used  Substance Use Topics  . Alcohol use: No    Alcohol/week: 0.0 standard drinks  . Drug use: No    Home Medications Prior to Admission medications   Medication Sig Start Date End Date Taking? Authorizing  Provider  apixaban (ELIQUIS) 2.5 MG TABS tablet Take 1 tablet (2.5 mg total) by mouth 2 (two) times daily. 06/09/20   Troy Sine, MD  atorvastatin (LIPITOR) 40 MG tablet TAKE 1 TABLET (40 MG TOTAL) BY MOUTH DAILY. KEEP OV. 12/06/19   Troy Sine, MD  Chloramphenicol Palmitate POWD Place 3 puffs into both ears daily.     [provider]  cycloSPORINE (RESTASIS) 0.05 % ophthalmic emulsion Place 1 drop into both eyes in the morning and at bedtime. 01/21/20   [provider]  digoxin (LANOXIN) 0.125 MG tablet Take 0.0625 mg by mouth daily.    [provider]  diphenhydramine-acetaminophen (TYLENOL PM) 25-500 MG TABS Take 2 tablets by mouth at bedtime.     [provider]  ENSURE (ENSURE) Take 1 Can by mouth daily.    [provider]  furosemide (LASIX) 40 MG tablet TAKE 1.5 TABLETS (60 MG TOTAL) BY MOUTH DAILY. KEEP OV. 01/05/20   Troy Sine, MD  Methylcellulose, Laxative, (CITRUCEL) 500 MG TABS Take 1,000 mg  by mouth at bedtime.    [provider]  metoprolol tartrate (LOPRESSOR) 50 MG tablet TAKE 1.5 TABLETS (75 MG TOTAL) BY MOUTH 2 (TWO) TIMES DAILY. 07/06/20   Troy Sine, MD  midodrine (PROAMATINE) 2.5 MG tablet Take 1 tablet (2.5 mg total) by mouth 2 (two) times daily with a meal. 12/07/19   Troy Sine, MD  niacin (NIASPAN) 500 MG CR tablet TAKE 1 TABLET BY MOUTH AT BEDTIME. MUST MAKE OFFICE APPOINTMENT 05/10/20   Troy Sine, MD  PROAIR HFA 108 [phone] Base) MCG/ACT inhaler INHALE 2 PUFFS INTO THE  LUNGS EVERY 4 HOURS AS  NEEDED FOR WHEEZING OR  SHORTNESS OF BREATH 04/20/18   Collene Gobble, MD  Probiotic Product ([employer]S SENIOR PROBIOTIC PO) Take 1 capsule by mouth at bedtime. Once daily    [provider]  revefenacin (YUPELRI) 175 MCG/3ML nebulizer solution Inhale 3 mLs (175 mcg total) into the lungs daily. 04/06/20   Collene Gobble, MD  SIMBRINZA 1-0.2 % SUSP Apply 1 drop to eye 2 (two) times daily. 10/12/19   [provider]  Tamsulosin HCl (FLOMAX) 0.4 MG CAPS Take 0.4 mg by mouth at bedtime.     [provider]    Allergies    Patient has no known allergies.  Review of Systems   Review of Systems  Constitutional: Positive for chills. Negative for fever.  Respiratory: Positive for shortness of breath.   Cardiovascular: Positive for chest pain.  Gastrointestinal: Positive for abdominal pain, nausea and vomiting. Negative for constipation and diarrhea.  Genitourinary: Negative for difficulty urinating and dysuria.  Musculoskeletal: Positive for back pain.  Skin: Negative for rash and wound.  Neurological: Positive for weakness.  Psychiatric/Behavioral: Negative for confusion.  All other systems reviewed and are negative.   Physical Exam Updated Vital Signs BP (!) 147/62 (BP Location: Left Arm)   Pulse 60   Temp 98.6 F (37 C) (Oral)   Resp 18   Ht '5\' 9"'  (1.753 m)   Wt 73.5 kg   SpO2 98%   BMI 23.92 kg/m   Physical Exam Vitals and nursing note reviewed.  Constitutional:      General: He is not in acute distress.    Appearance: He is well-developed. He is not diaphoretic.  HENT:     Head: Normocephalic and atraumatic.  Pulmonary:     Effort: Pulmonary effort is normal.  Abdominal:     Palpations: Abdomen is soft.     Tenderness: There is abdominal tenderness in the right upper quadrant. There is no right CVA tenderness or left CVA tenderness.  Neurological:     Mental Status: He is alert and oriented to person, place, and time.  Psychiatric:        Behavior: Behavior normal.     ED Results / Procedures / Treatments   Labs (all labs ordered are listed, but only abnormal results are displayed) Labs Reviewed  LIPASE, BLOOD - Abnormal; Notable for the following components:      Result Value   Lipase 70 (*)    All other components within normal limits  COMPREHENSIVE METABOLIC PANEL - Abnormal; Notable for the following components:   Glucose, Bld 105 (*)     Creatinine, Ser 1.28 (*)    Total Protein 6.4 (*)    Albumin 3.4 (*)    AST 56 (*)    ALT 60 (*)    Alkaline Phosphatase 152 (*)    GFR calc non Af Wyvonnia Lora  50 (*)    GFR calc Af Amer 58 (*)    All other components within normal limits  CBC - Abnormal; Notable for the following components:   RBC 3.23 (*)    Hemoglobin 10.7 (*)    HCT 32.7 (*)    MCV 101.2 (*)    All other components within normal limits  URINALYSIS, ROUTINE W REFLEX MICROSCOPIC - Abnormal; Notable for the following components:   Leukocytes,Ua MODERATE (*)    All other components within normal limits    EKG  ECG interpretation: Normal sinus rhythm 66 bpm.  Low voltage QRS.  Normal axis.  Normal intervals.  Normal ST-T wave.  Complete right bundle branch block present.  When compared with ECG of 06/25/2017, heart rate is slower, right bundle branch block is now present.  Radiology DG Chest 1 View  Result Date: 07/11/2020 CLINICAL DATA:  Right upper quadrant pain EXAM: CHEST  1 VIEW COMPARISON:  06/15/2017 FINDINGS: Hyperinflation. There is no edema, consolidation, effusion, or pneumothorax. Normal heart size and mediastinal contours. There has been CABG and dual-chamber pacer implant. IMPRESSION: 1. No evidence of acute disease. 2. Hyperinflation. Electronically Signed   By: Monte Fantasia M.D.   On: 07/11/2020 04:52   US Abdomen Limited  Result Date: 07/11/2020 CLINICAL DATA:  Right upper quadrant abdominal pain. EXAM: ULTRASOUND ABDOMEN LIMITED RIGHT UPPER QUADRANT COMPARISON:  Abdominal ultrasound 07/07/2020 FINDINGS: Gallbladder: A few small nonshadowing echogenic structures may be gallbladder polyps. I do not see any definite mobile mobile stones. No gallbladder wall thickening, pericholecystic fluid or sonographic Bravery Ketcham sign to suggest acute cholecystitis. Common bile duct: Diameter: 4.7 mm Liver: Normal echogenicity. 2.0 x 1.0 cm left hepatic lobe cyst is noted and unchanged since prior CT scans. Portal vein is  patent on color Doppler imaging with normal direction of blood flow towards the liver. Other: None. IMPRESSION: 1. Probable small gallbladder polyps. No definite mobile shadowing gallstones. 2. No findings for acute cholecystitis or biliary dilatation. 3. Normal liver except for a stable small simple cyst in the left hepatic lobe. Electronically Signed   By: Marijo Sanes M.D.   On: 07/11/2020 05:27    Procedures Procedures (including critical care time)  Medications Ordered in ED Medications  ondansetron (ZOFRAN) injection 4 mg (4 mg Intravenous Given 07/11/20 0354)  HYDROmorphone (DILAUDID) injection 0.5 mg (0.5 mg Intravenous Given 07/11/20 0354)    ED Course  I have reviewed the triage vital signs and the nursing notes.  Pertinent labs & imaging results that were available during my care of the patient were reviewed by me and considered in my medical decision making (see chart for details).  Clinical Course as of Jul 11 706  Tue Jul 11, 2020  0539 [name] [phone]   [LM]  6831 86 year old male with recent diagnosis of gallstones brought in by daughter for abdominal pain with dry heaves and progressively worsening weakness.  On exam, has RUQ abdominal tenderness.  Patient is ambulatory to the bathroom with assistance/is unsteady.  [place] RUQ returns without gallstones, does show possible polyps, no evidence of acute cholecystitis. CMP with normal WBC, CMP with elevated AST compared to recent labs, ALT and alk phos elevated but improved compared to prior. Lipase 70 (no prior for comparison).  CT abdomen/pelvis added to rule out other causes of patient's abdominal pain. Discussed with Dr. Roxanne Mins, ER attending, plan is to admit to hospitalist for pain if no other cause found, possibly passed gallstone causing elevated lipase and  pain episode.  Care signed out pending CT abdomen/pelvis.    [LM]  [phone] Called and spoke with patient's daughter, [name], updated with plan for CT and  consult.    [LM]    Clinical Course User Index [LM] Roque Lias   MDM Rules/Calculators/A&P                          Final Clinical Impression(s) / ED Diagnoses Final diagnoses:  Right upper quadrant abdominal pain    Rx / DC Orders ED Discharge Orders    None       Roque Lias 29/57/47 3403    Delora Fuel, MD 70/96/43 [phone]

## 2020-07-11 NOTE — Evaluation (Addendum)
Physical Therapy Evaluation Patient Details Name: [name] MRN: [id number] DOB: [birth year] Today's Date: 07/11/2020   History of Present Illness  Pt is a 86 y.o. male with medical history significant of pacemaker placement; afib; MGUS; CAD; and COPD presenting with abdominal pain.  He reports having 6 months of RLQ abdominal pain.  The pain is intermittent in nature and sharp with certain movements.  He has persistent nausea that he relates to the pain.  Additionally, pt with progressive weakness.  Pt's principal problem per H and P is failure to thrive and R LQ abdominal pain.  Clinical Impression  Pt admitted with above diagnosis. Pt presenting with generalized weakness, decreased mobility, and decreased balance and endurance.  He required min A for transfers and steadying with gait.  Pt did have increased HR and decreased BP with transfers - reports weakness but denies syncopal symptoms.  Pt did have c/o dyspnea, but O2 sats were stable.  Pt does have support from family, but has been declining in strength over the past few months and would benefit from skilled rehab to improve.  Pt currently with functional limitations due to the deficits listed below (see PT Problem List). Pt will benefit from skilled PT to increase their independence and safety with mobility to allow discharge to the venue listed below.       Follow Up Recommendations SNF;Supervision/Assistance - 24 hour (SNF if possible; if not covered by insurance then HHPT)    Equipment Recommendations  None recommended by PT    Recommendations for Other Services       Precautions / Restrictions Precautions Precautions: Fall      Mobility  Bed Mobility Overal bed mobility: Needs Assistance Bed Mobility: Supine to Sit;Sit to Supine     Supine to sit: Min assist Sit to supine: Min assist      Transfers Overall transfer level: Needs assistance Equipment used: 1 person hand held assist Transfers: Sit to/from  Stand Sit to Stand: Min assist         General transfer comment: min  A for steadying; verbal cues for safe technique; performed sit to stand x 3  Ambulation/Gait Ambulation/Gait assistance: Min assist Gait Distance (Feet): 40 Feet Assistive device: 1 person hand held assist Gait Pattern/deviations: Decreased stride length;Shuffle;Trunk flexed Gait velocity: decreased   General Gait Details: unsteady; fatigued easily  Stairs            Wheelchair Mobility    Modified Rankin (Stroke Patients Only)       Balance Overall balance assessment: Needs assistance Sitting-balance support: No upper extremity supported Sitting balance-Leahy Scale: Good     Standing balance support: Single extremity supported Standing balance-Leahy Scale: Poor Standing balance comment: required UE support    Tolieting ADLs in standing with min A for steadying.                          Pertinent Vitals/Pain Pain Assessment: Faces Faces Pain Scale: Hurts little more Pain Location: abdomen Pain Descriptors / Indicators: Aching Pain Intervention(s): Limited activity within patient's tolerance;Monitored during session    Home Living Family/patient expects to be discharged to:: Private residence Living Arrangements: Children Available Help at Discharge: Family;Available 24 hours/day Type of Home: House Home Access: Stairs to enter Entrance Stairs-Rails: Psychiatric nurse of Steps: 3 Home Layout: One level Home Equipment: Clinical cytogeneticist - 4 wheels;Cane - single point      Prior Function Level of Independence: Independent with assistive  device(s)   Gait / Transfers Assistance Needed: Reports uses cane in home; rollator for short community distance; reports 1 fall in past 6 months (states passed out)  ADL's / Homemaking Assistance Needed: Reports independent with ADLs; daughter does IADLs  Comments: Reports daughter can assist as needed.  Pt reporting  gradual weakness over the past 2 montsh.     Hand Dominance        Extremity/Trunk Assessment   Upper Extremity Assessment Upper Extremity Assessment: Generalized weakness    Lower Extremity Assessment Lower Extremity Assessment: Generalized weakness    Cervical / Trunk Assessment Cervical / Trunk Assessment: Normal  Communication   Communication: HOH (Deaf in R ear; talk loudly in left ear)  Cognition Arousal/Alertness: Awake/alert Behavior During Therapy: WFL for tasks assessed/performed Overall Cognitive Status: No family/caregiver present to determine baseline cognitive functioning                                 General Comments: Pt able to follow commands.  A and O x 3 (did not know date or month, but knew the year)      General Comments General comments (skin integrity, edema, etc.): Pt educated on PT role and POC.  Gave permission to call and speak with daughters regarding POC.  Called for [name], but [name] was available and confirmed PLOF and that pt getting weaker over past 2 months and does have 24 hr support.  States he was supposed to start outpt PT due to [facility] unavailable due to staffing issues.  They are interested in SNF if covered by insurance, and if not then trying HHPT again.  Orthostatic BPs  Supine 169/63 and HR 85  Sitting 145/65 and HR 94   Standing 111/86 and HR 121   Standing after 3 mins 133/71 and HR 121  Sitting after walk 138/58 and HR 122   Pt reported feeling weak with standing but denied dizziness or syncopal symptoms.    Pt was educated on and provided cues for safe transfer technique. Recommended sitting for several mins prior to standing and then standing at EOB prior to walking.    Exercises     Assessment/Plan    PT Assessment Patient needs continued PT services  PT Problem List Decreased strength;Decreased mobility;Decreased coordination;Decreased knowledge of precautions;Decreased activity tolerance;Decreased  cognition;Decreased balance;Decreased knowledge of use of DME;Cardiopulmonary status limiting activity       PT Treatment Interventions DME instruction;Therapeutic activities;Gait training;Therapeutic exercise;Patient/family education;Stair training;Balance training;Functional mobility training    PT Goals (Current goals can be found in the Care Plan section)  Acute Rehab PT Goals Patient Stated Goal: get stronger PT Goal Formulation: With patient Time For Goal Achievement: 07/25/20 Potential to Achieve Goals: Good    Frequency Min 3X/week   Barriers to discharge        Co-evaluation               AM-PAC PT "6 Clicks" Mobility  Outcome Measure Help needed turning from your back to your side while in a flat bed without using bedrails?: A Little Help needed moving from lying on your back to sitting on the side of a flat bed without using bedrails?: A Little Help needed moving to and from a bed to a chair (including a wheelchair)?: A Little Help needed standing up from a chair using your arms (e.g., wheelchair or bedside chair)?: A Little Help needed to walk in hospital room?: A  Little Help needed climbing 3-5 steps with a railing? : A Little 6 Click Score: 18    End of Session Equipment Utilized During Treatment: Gait belt Activity Tolerance: Patient tolerated treatment well Patient left: in bed;with call bell/phone within reach Nurse Communication: Mobility status;Other (comment) (decreased BP but asymptomatic with transfers) PT Visit Diagnosis: Unsteadiness on feet (R26.81);Muscle weakness (generalized) (M62.81)    Time: 5872-7618 PT Time Calculation (min) (ACUTE ONLY): 33 min   Charges:   PT Evaluation $PT Eval Moderate Complexity: 1 Mod PT Treatments $Therapeutic Activity: 8-22 mins        Abran Richard, PT Acute Rehab Services Pager [phone] [facility] [phone]    Karlton Lemon 07/11/2020, 12:55 PM

## 2020-07-11 NOTE — ED Provider Notes (Signed)
Physical Exam  BP (!) 150/67 (BP Location: Left Arm)   Pulse 61   Temp 98 F (36.7 C) (Oral)   Resp 20   Ht '5\' 9"'  (1.753 m)   Wt 73.5 kg   SpO2 99%   BMI 23.92 kg/m   Physical Exam Vitals and nursing note reviewed.  Constitutional:      General: He is not in acute distress.    Appearance: He is well-developed. He is not diaphoretic.  HENT:     Head: Normocephalic and atraumatic.  Eyes:     General: No scleral icterus.    Conjunctiva/sclera: Conjunctivae normal.  Pulmonary:     Effort: Pulmonary effort is normal. No respiratory distress.  Abdominal:     Tenderness: There is abdominal tenderness in the right upper quadrant and right lower quadrant.  Musculoskeletal:     Cervical back: Normal range of motion.  Skin:    Findings: No rash.  Neurological:     Mental Status: He is alert.     ED Course/Procedures   Clinical Course as of Jul 11 1049  Tue Jul 11, 2020  0317 [name] [phone]   [LM]  8232 86 year old male with recent diagnosis of gallstones brought in by daughter for abdominal pain with dry heaves and progressively worsening weakness.  On exam, has RUQ abdominal tenderness.  Patient is ambulatory to the bathroom with assistance/is unsteady.  [place] RUQ returns without gallstones, does show possible polyps, no evidence of acute cholecystitis. CMP with normal WBC, CMP with elevated AST compared to recent labs, ALT and alk phos elevated but improved compared to prior. Lipase 70 (no prior for comparison).  CT abdomen/pelvis added to rule out other causes of patient's abdominal pain. Discussed with Dr. Roxanne Mins, ER attending, plan is to admit to hospitalist for pain if no other cause found, possibly passed gallstone causing elevated lipase and pain episode.  Care signed out pending CT abdomen/pelvis.    [LM]  [phone] Called and spoke with patient's daughter, [name], updated with plan for CT and consult.    [LM]    Clinical Course User Index [LM] Tacy Learn,  PA-C    Procedures  MDM   Care of patient assumed from PA [place] at 7:00AM.  Agree with history, physical exam and plan.  See their note for further details.  Briefly, 86 y.o. male with PMH/PSH as below who presents with abdominal pain, decreased appetite and vomiting.  Symptoms began approximately 5 days ago.  He was seen and evaluated by his oncologist who was concern for decline in health, elevated liver enzymes.  Ultrasound ordered at the time showed gallstones.  Reports increased pain in his right upper quadrant radiating to the back since eating a sandwich yesterday.  Reports chronic shortness of breath and chest pain related to COPD.  Family is concerned that he is growing more weak progressively. Work-up today significant for elevation in LFTs, new elevation in lipase.  EKG without any ischemic changes.  Right upper quadrant ultrasound shows possible gallbladder polyps without evidence of stone.  Question whether this could be a recently passed stone.  Past Medical History:  Diagnosis Date  . ACS (acute coronary syndrome) ([place])   . Acute respiratory failure with hypoxia ([place]) 05/2017  . Anemia of renal disease 12/05/2011  . Anemia, iron deficiency 12/05/2011  . Asthma   . CAS (cerebral atherosclerosis)    Carotid Dopplers, 04/09/2012 - Bilateral Bulb/Proximal ICAs-mild to moderate amount of fibrous plaque of fibrous  soft plaque w/o evidence of a significant diameter reduction, dissection, or any other vascular abnormality  . COPD (chronic obstructive pulmonary disease) ([place])   . Coronary heart disease    a. s/p CABG in 2006 with LIMA-LAD, SVG-Cx, SVG-IM-OM b. low-risk NST in 08/2016  . Dysrhythmia 11/09/2013   SVT VS A FLUTTER   . Kidney stones   . MGUS (monoclonal gammopathy of unknown significance) 12/05/2011  . Orthostatic hypotension 09/10/2016  . PAF (paroxysmal atrial fibrillation) ([facility])    a. on Eliquis  . Shortness of breath   . SSS (sick sinus syndrome) ([place])    a. s/p PPM  placement in 2015   Past Surgical History:  Procedure Laterality Date  . CARDIAC CATHETERIZATION  12/05/2005   Recommended CABG  . CARDIOVERSION N/A 11/09/2013   Procedure: CARDIOVERSION;  Surgeon: Pixie Casino, MD;  Location: [facility] ENDOSCOPY;  Service: Cardiovascular;  Laterality: N/A;  . CATARACT EXTRACTION    . CORONARY ARTERY BYPASS GRAFT  12/06/2005   x4, LIMA to LAD, vein to distal circumflex, sequential vein to intermediate and obtuse marginal vessel  . INNER EAR SURGERY    . KIDNEY STONE SURGERY    . LOOP RECORDER IMPLANT N/A 11/30/2013   Procedure: LOOP RECORDER IMPLANT;  Surgeon: Sanda Klein, MD;  Location: [facility] CATH LAB;  Service: Cardiovascular;  Laterality: N/A;  . PERMANENT PACEMAKER INSERTION N/A 02/04/2014   Procedure: PERMANENT PACEMAKER INSERTION;  Surgeon: Sanda Klein, MD;  Location: [facility] CATH LAB;  Service: Cardiovascular;  Laterality: N/A;  . TEE WITHOUT CARDIOVERSION N/A 11/09/2013   Procedure: TRANSESOPHAGEAL ECHOCARDIOGRAM (TEE);  Surgeon: Pixie Casino, MD;  Location[facility] ENDOSCOPY;  Service: Cardiovascular;  Laterality: N/A;      Current Plan: Obtain CT of the abdomen pelvis to rule out other cause of his symptoms. We will need to admit to hospitalist service for his decreased appetite, pain control and generalized weakness.   MDM/ED Course: 8:32 AM CT shows 4 mm calculus in the left side of the bladder without any obstructing calculi.  No other acute findings noted. Patient with continued tenderness palpation of the abdomen and complaints of generalized weakness, however he is resting comfortably on my recheck.  Per previous team will admit for pain control in the setting of recently passed gallstone causing elevated lipase.  9:20 AM Spoke to hospitalist Dr. Lorin Mercy. She will speak to the patient at the bedside and patient's daughter over the phone to determine best disposition.  10:50 AM Hospitalist Dr. Lorin Mercy to admit patient for observation. Appreciate  her help for management of this patient.  Consults: Hospitalist- Dr. Lorin Mercy   Significant labs/images: DG Chest 1 View  Result Date: 07/11/2020 CLINICAL DATA:  Right upper quadrant pain EXAM: CHEST  1 VIEW COMPARISON:  06/15/2017 FINDINGS: Hyperinflation. There is no edema, consolidation, effusion, or pneumothorax. Normal heart size and mediastinal contours. There has been CABG and dual-chamber pacer implant. IMPRESSION: 1. No evidence of acute disease. 2. Hyperinflation. Electronically Signed   By: Monte Fantasia M.D.   On: 07/11/2020 04:52   US Abdomen Complete  Result Date: 07/08/2020 CLINICAL DATA:  Abnormal LFTs. EXAM: ABDOMEN ULTRASOUND COMPLETE COMPARISON:  Ultrasound dated December 05, 2016 FINDINGS: Gallbladder: The gallbladder is significantly distended. Small gallstones are noted. There is no gallbladder wall thickening. No pericholecystic free fluid. The sonographic Percell Miller sign is negative. Common bile duct: Diameter: 4 mm Liver: The liver surface appears lobular in contour. There is a 2.4 cm cyst within the left hepatic lobe. Portal vein  is patent on color Doppler imaging with normal direction of blood flow towards the liver. IVC: No abnormality visualized. Pancreas: Visualized portion unremarkable. Spleen: The spleen appears to be at least borderline enlarged. Right Kidney: Length: 9.8 cm. There is no hydronephrosis. There is a nonobstructing 1.2 cm stone. Left Kidney: Length: 11.2. There is no hydronephrosis. There is a 6 mm nonobstructing stone. Multiple small cysts are noted. Abdominal aorta: No aneurysm visualized. Other findings: None. IMPRESSION: 1. Nodular contour of the liver concerning for underlying cirrhosis. There appears to be at least borderline splenomegaly which can be seen in patients with portal hypertension. 2. Distended gallbladder with cholelithiasis. No sonographic evidence for acute cholecystitis. 3. Nonobstructing bilateral nephrolithiasis. Electronically Signed    By: Constance Holster M.D.   On: 07/08/2020 16:35   CT Abdomen Pelvis W Contrast  Result Date: 07/11/2020 CLINICAL DATA:  Upper abdominal pain. EXAM: CT ABDOMEN AND PELVIS WITH CONTRAST TECHNIQUE: Multidetector CT imaging of the abdomen and pelvis was performed using the standard protocol following bolus administration of intravenous contrast. CONTRAST:  131m OMNIPAQUE IOHEXOL 350 MG/ML SOLN COMPARISON:  CT scan 02/20/2018 FINDINGS: Lower chest: Left basilar scarring and some atelectasis due to mild eventration left hemidiaphragm. No worrisome pulmonary lesions or pleural effusion. Underlying emphysematous changes are stable. The heart is normal in size. Stable pacer wires. Stable aortic calcifications. Hepatobiliary: Stable low-attenuation lesion in the left hepatic lobe consistent with a benign cyst. No worrisome hepatic lesions or intrahepatic biliary dilatation. Gallbladder is unremarkable. No common bile duct dilatation. Pancreas: No mass, inflammation or ductal dilatation. Spleen: Normal size.  Small splenic cyst. Adrenals/Urinary Tract: The adrenal glands are unremarkable. Bilateral renal calculi are noted. No obstructing ureteral calculi are identified. There are bilateral renal scarring changes and small renal cysts. No worrisome renal lesions or hydronephrosis. There is a 4 mm calculus in the bladder on the left side. No bladder lesions. Right-sided bladder diverticulum is stable. Stomach/Bowel: The stomach, duodenum, small bowel and colon are grossly normal without oral contrast. No acute inflammatory changes, mass lesions or obstructive findings. The terminal ileum is normal. The appendix is not identified for certain but no findings to suggest acute appendicitis. Sigmoid colon diverticulosis but no findings for acute diverticulitis. Vascular/Lymphatic: Advanced atherosclerotic calcifications involving the aorta and branch vessels but no aneurysm or dissection. No mesenteric or retroperitoneal mass  or adenopathy. Reproductive: Mild prostate gland enlargement. Possible prior TURP defect. The seminal vesicles are unremarkable. Other: No pelvic mass or adenopathy. No free pelvic fluid collections. No inguinal mass or adenopathy. No abdominal wall hernia or subcutaneous lesions. Musculoskeletal: No significant bony findings. Stable T12 and L1 compression deformities. IMPRESSION: 1. No acute abdominal/pelvic findings, mass lesions or adenopathy. 2. Bilateral renal calculi but no obstructing ureteral calculi. 3. 4 mm calculus in the left side of the bladder. 4. Stable right-sided bladder diverticulum. 5. Advanced atherosclerotic calcifications involving the aorta and branch vessels. 6. Stable T12 and L1 compression deformities. Aortic Atherosclerosis (ICD10-I70.0). Electronically Signed   By: PMarijo SanesM.D.   On: 07/11/2020 08:12   [place]Abdomen Limited  Result Date: 07/11/2020 CLINICAL DATA:  Right upper quadrant abdominal pain. EXAM: ULTRASOUND ABDOMEN LIMITED RIGHT UPPER QUADRANT COMPARISON:  Abdominal ultrasound 07/07/2020 FINDINGS: Gallbladder: A few small nonshadowing echogenic structures may be gallbladder polyps. I do not see any definite mobile mobile stones. No gallbladder wall thickening, pericholecystic fluid or sonographic Murphy sign to suggest acute cholecystitis. Common bile duct: Diameter: 4.7 mm Liver: Normal echogenicity. 2.0 x 1.0 cm left  hepatic lobe cyst is noted and unchanged since prior CT scans. Portal vein is patent on color Doppler imaging with normal direction of blood flow towards the liver. Other: None. IMPRESSION: 1. Probable small gallbladder polyps. No definite mobile shadowing gallstones. 2. No findings for acute cholecystitis or biliary dilatation. 3. Normal liver except for a stable small simple cyst in the left hepatic lobe. Electronically Signed   By: Marijo Sanes M.D.   On: 07/11/2020 05:27     I personally reviewed and interpreted all labs.   Portions of this note  were generated with [employer]. Dictation errors may occur despite best attempts at proofreading.       Delia Heady, PA-C 07/11/20 1051    Isla Pence, MD 07/11/20 1155

## 2020-07-11 NOTE — ED Notes (Signed)
Lunch Tray Ordered @ 1123. 

## 2020-07-11 NOTE — H&P (Signed)
History and Physical    KHOA OPDAHL LKT:[id number] DOB: October 03, 1929 DOA: 07/10/2020  PCP: Cyndi Bender, PA-C, Alphonsus Sias, PA-C Consultants:  Marin Olp - oncology; Sallee Provencal - ENT; Rankin - retina; [place]rum - pulmonology; Cro[place]u - cardiology; Ronce[place] - urology; Claiborne Billings - cardiology Patient coming from:  Home - lives with daughter, [name]; [place]: Daughter, [name], [phone]   Chief Complaint: Abdominal pain  HPI: [name] is a 86 y.o. male with medical history significant of pacemaker placement; afib; MGUS; CAD; and COPD presenting with abdominal pain.  He reports having 6 months of RLQ abdominal pain.  The pain is intermittent in nature and sharp with certain movements.  He has persistent nausea that he relates to the pain.  He has normal BMs with some constipation.  No fevers.  Progressive weakness.  His daughter reports that he has been declining for the last few months, having abdominal month.  Pain doesn't move.  He has been to Dr. Marin Olp - ?liver, gallstones.  He fell about 2 weeks ago and was unable to get up.  EMS came a few days later and thought maybe it was a pulled muscle and suggested PCP f/u.  Dr. Marin Olp gave him IVF.  He is not eating/drinking, sleeping excessively.  He is losing weight and energy.  GP recommended PT/OT.  1 month ago with gouty flare in his hand.  He was initially on abx for ?sepsis.    ED Course:  Needs admission for pain control.  Has MGUS, worsening weakness and abdominal pain.  Sent for [place] last week, shows gallstones.  No current stones, ?recently passed.  Lipase elevated, mild LFT elevation.  Unremarkable CT.    Review of Systems: As per HPI; otherwise review of systems reviewed and negative.    Past Medical History:  Diagnosis Date  . ACS (acute coronary syndrome) ([place])   . Acute respiratory failure with hypoxia ([place]) 05/2017  . Anemia of renal disease 12/05/2011  . Anemia, iron deficiency 12/05/2011  . Asthma   . CAS (cerebral  atherosclerosis)    Carotid Dopplers, 04/09/2012 - Bilateral Bulb/Proximal ICAs-mild to moderate amount of fibrous plaque of fibrous soft plaque w/o evidence of a significant diameter reduction, dissection, or any other vascular abnormality  . COPD (chronic obstructive pulmonary disease) ([place])   . Coronary heart disease    a. s/p CABG in 2006 with LIMA-LAD, SVG-Cx, SVG-IM-OM b. low-risk NST in 08/2016  . Dysrhythmia 11/09/2013   SVT VS A FLUTTER   . Kidney stones   . MGUS (monoclonal gammopathy of unknown significance) 12/05/2011  . Orthostatic hypotension 09/10/2016  . PAF (paroxysmal atrial fibrillation) (HCC)    a. on Eliquis  . Shortness of breath   . SSS (sick sinus syndrome) ([place])    a. s/p PPM placement in 2015    Past Surgical History:  Procedure Laterality Date  . CARDIAC CATHETERIZATION  12/05/2005   Recommended CABG  . CARDIOVERSION N/A 11/09/2013   Procedure: CARDIOVERSION;  Surgeon: Pixie Casino, MD;  Location: [facility] ENDOSCOPY;  Service: Cardiovascular;  Laterality: N/A;  . CATARACT EXTRACTION    . CORONARY ARTERY BYPASS GRAFT  12/06/2005   x4, LIMA to LAD, vein to distal circumflex, sequential vein to intermediate and obtuse marginal vessel  . INNER EAR SURGERY    . KIDNEY STONE SURGERY    . LOOP RECORDER IMPLANT N/A 11/30/2013   Procedure: LOOP RECORDER IMPLANT;  Surgeon: Sanda Klein, MD;  Location: [place] [facility] CATH LAB;  Service: Cardiovascular;  Laterality: N/A;  .  PERMANENT PACEMAKER INSERTION N/A 02/04/2014   Procedure: PERMANENT PACEMAKER INSERTION;  Surgeon: Sanda Klein, MD;  Location: [place] CATH LAB;  Service: Cardiovascular;  Laterality: N/A;  . TEE WITHOUT CARDIOVERSION N/A 11/09/2013   Procedure: TRANSESOPHAGEAL ECHOCARDIOGRAM (TEE);  Surgeon: Pixie Casino, MD;  Location[facility]COPY;  Service: Cardiovascular;  Laterality: N/A;    Social History   Socioeconomic History  . Marital status: Married    Spouse name: Not on file  . Number of children: Not on file    . Years of education: Not on file  . Highest education level: Not on file  Occupational History  . Occupation: retired Arts administrator  . Occupation: maintenence  Tobacco Use  . Smoking status: Former Smoker    Packs/day: 1.00    Years: 47.00    Pack years: 47.00    Types: Cigarettes    Start date: 12/02/1951    Quit date: 12/30/1993    Years since quitting: 26.5  . Smokeless tobacco: Never Used  . Tobacco comment: quit 20 years ago  Vaping Use  . Vaping Use: Never used  Substance and Sexual Activity  . Alcohol use: No    Alcohol/week: 0.0 standard drinks  . Drug use: No  . Sexual activity: Not on file  Other Topics Concern  . Not on file  Social History Narrative   Pt lives with daughter.    Social Determinants of Health   Financial Resource Strain:   . Difficulty of Paying Living Expenses:   Food Insecurity:   . Worried About Charity fundraiser in the Last Year:   . Arboriculturist in the Last Year:   Transportation Needs:   . Film/video editor (Medical):   Marland Kitchen Lack of Transportation (Non-Medical):   Physical Activity:   . Days of Exercise per Week:   . Minutes of Exercise per Session:   Stress:   . Feeling of Stress :   Social Connections:   . Frequency of Communication with Friends and Family:   . Frequency of Social Gatherings with Friends and Family:   . Attends Religious Services:   . Active Member of Clubs or Organizations:   . Attends Archivist Meetings:   Marland Kitchen Marital Status:   Intimate Partner Violence:   . Fear of Current or Ex-Partner:   . Emotionally Abused:   Marland Kitchen Physically Abused:   . Sexually Abused:     No Known Allergies  Family History  Problem Relation Age of Onset  . Tuberculosis Father   . Heart attack Father   . Heart disease Mother   . Heart attack Mother   . Heart disease Brother   . Emphysema Daughter        premature without tobacco exposure    Prior to Admission medications   Medication Sig Start  Date End Date Taking? Authorizing Provider  apixaban (ELIQUIS) 2.5 MG TABS tablet Take 1 tablet (2.5 mg total) by mouth 2 (two) times daily. 06/09/20   Troy Sine, MD  atorvastatin (LIPITOR) 40 MG tablet TAKE 1 TABLET (40 MG TOTAL) BY MOUTH DAILY. KEEP OV. 12/06/19   Troy Sine, MD  Chloramphenicol Palmitate POWD Place 3 puffs into both ears daily.     [provider]  cycloSPORINE (RESTASIS) 0.05 % ophthalmic emulsion Place 1 drop into both eyes in the morning and at bedtime. 01/21/20   [provider]  digoxin (LANOXIN) 0.125 MG tablet Take 0.0625 mg by mouth daily.  [provider]  diphenhydramine-acetaminophen (TYLENOL PM) 25-500 MG TABS Take 2 tablets by mouth at bedtime.     [provider]  ENSURE (ENSURE) Take 1 Can by mouth daily.    [provider]  furosemide (LASIX) 40 MG tablet TAKE 1.5 TABLETS (60 MG TOTAL) BY MOUTH DAILY. KEEP OV. 01/05/20   Troy Sine, MD  Methylcellulose, Laxative, (CITRUCEL) 500 MG TABS Take 1,000 mg by mouth at bedtime.    [provider]  metoprolol tartrate (LOPRESSOR) 50 MG tablet TAKE 1.5 TABLETS (75 MG TOTAL) BY MOUTH 2 (TWO) TIMES DAILY. 07/06/20   Troy Sine, MD  midodrine (PROAMATINE) 2.5 MG tablet Take 1 tablet (2.5 mg total) by mouth 2 (two) times daily with a meal. 12/07/19   Troy Sine, MD  niacin (NIASPAN) 500 MG CR tablet TAKE 1 TABLET BY MOUTH AT BEDTIME. MUST MAKE OFFICE APPOINTMENT 05/10/20   Troy Sine, MD  PROAIR HFA 108 [phone] Base) MCG/ACT inhaler INHALE 2 PUFFS INTO THE  LUNGS EVERY 4 HOURS AS  NEEDED FOR WHEEZING OR  SHORTNESS OF BREATH 04/20/18   Collene Gobble, MD  Probiotic Product ([employer] SENIOR PROBIOTIC PO) Take 1 capsule by mouth at bedtime. Once daily    [provider]  revefenacin (YUPELRI) 175 MCG/3ML nebulizer solution Inhale 3 mLs (175 mcg total) into the lungs daily. 04/06/20   Collene Gobble, MD  SIMBRINZA 1-0.2 % SUSP Apply 1 drop to eye 2 (two)  times daily. 10/12/19   [provider]  Tamsulosin HCl (FLOMAX) 0.4 MG CAPS Take 0.4 mg by mouth at bedtime.     [provider]    Physical Exam: Vitals:   07/10/20 2112 07/10/20 2336 07/11/20 0129 07/11/20 0955  BP: (!) 141/61 (!) 144/69 (!) 147/62 (!) 150/67  Pulse: 77 76 60 61  Resp: 20 18 18 20   Temp: 98 F (36.7 C) 97.7 F (36.5 C) 98.6 F (37 C) 98 F (36.7 C)  TempSrc: Oral Oral Oral Oral  SpO2: 98% 99% 98% 99%  Weight: 73.5 kg     Height: 5\' 9"  (1.753 m)        . General:  Appears calm and comfortable and is NAD . Eyes:  EOMI, normal lids, iris . ENT:  grossly normal hearing, lips & tongue, mmm; appropriate dentition . Neck:  no LAD, masses or thyromegaly . Cardiovascular:  RRR, no m/r/g. No LE edema.  Marland Kitchen Respiratory:   CTA bilaterally with no wheezes/rales/rhonchi.  Normal respiratory effort. . Abdomen:  soft, ND, superficial RLQ TTP without apparent abdominal wall hernia . Skin:  no rash or induration seen on limited exam . Musculoskeletal:  grossly normal tone BUE/BLE, good ROM, no bony abnormality . Lower extremity:  No LE edema.  Limited foot exam with no ulcerations.  2+ distal pulses. Marland Kitchen Psychiatric:  grossly normal mood and affect, speech fluent and appropriate, AOx3 . Neurologic:  CN 2-12 grossly intact, moves all extremities in coordinated fashion    Radiological Exams on Admission: DG Chest 1 View  Result Date: 07/11/2020 CLINICAL DATA:  Right upper quadrant pain EXAM: CHEST  1 VIEW COMPARISON:  06/15/2017 FINDINGS: Hyperinflation. There is no edema, consolidation, effusion, or pneumothorax. Normal heart size and mediastinal contours. There has been CABG and dual-chamber pacer implant. IMPRESSION: 1. No evidence of acute disease. 2. Hyperinflation. Electronically Signed   By: Monte Fantasia M.D.   On: 07/11/2020 04:52   CT Abdomen Pelvis W Contrast  Result Date:  07/11/2020 CLINICAL DATA:  Upper abdominal pain. EXAM: CT ABDOMEN AND  PELVIS WITH CONTRAST TECHNIQUE: Multidetector CT imaging of the abdomen and pelvis was performed using the standard protocol following bolus administration of intravenous contrast. CONTRAST:  111mL OMNIPAQUE IOHEXOL 350 MG/ML SOLN COMPARISON:  CT scan 02/20/2018 FINDINGS: Lower chest: Left basilar scarring and some atelectasis due to mild eventration left hemidiaphragm. No worrisome pulmonary lesions or pleural effusion. Underlying emphysematous changes are stable. The heart is normal in size. Stable pacer wires. Stable aortic calcifications. Hepatobiliary: Stable low-attenuation lesion in the left hepatic lobe consistent with a benign cyst. No worrisome hepatic lesions or intrahepatic biliary dilatation. Gallbladder is unremarkable. No common bile duct dilatation. Pancreas: No mass, inflammation or ductal dilatation. Spleen: Normal size.  Small splenic cyst. Adrenals/Urinary Tract: The adrenal glands are unremarkable. Bilateral renal calculi are noted. No obstructing ureteral calculi are identified. There are bilateral renal scarring changes and small renal cysts. No worrisome renal lesions or hydronephrosis. There is a 4 mm calculus in the bladder on the left side. No bladder lesions. Right-sided bladder diverticulum is stable. Stomach/Bowel: The stomach, duodenum, small bowel and colon are grossly normal without oral contrast. No acute inflammatory changes, mass lesions or obstructive findings. The terminal ileum is normal. The appendix is not identified for certain but no findings to suggest acute appendicitis. Sigmoid colon diverticulosis but no findings for acute diverticulitis. Vascular/Lymphatic: Advanced atherosclerotic calcifications involving the aorta and branch vessels but no aneurysm or dissection. No mesenteric or retroperitoneal mass or adenopathy. Reproductive: Mild prostate gland enlargement. Possible prior TURP defect. The seminal vesicles are unremarkable. Other: No pelvic mass or adenopathy. No  free pelvic fluid collections. No inguinal mass or adenopathy. No abdominal wall hernia or subcutaneous lesions. Musculoskeletal: No significant bony findings. Stable T12 and L1 compression deformities. IMPRESSION: 1. No acute abdominal/pelvic findings, mass lesions or adenopathy. 2. Bilateral renal calculi but no obstructing ureteral calculi. 3. 4 mm calculus in the left side of the bladder. 4. Stable right-sided bladder diverticulum. 5. Advanced atherosclerotic calcifications involving the aorta and branch vessels. 6. Stable T12 and L1 compression deformities. Aortic Atherosclerosis (ICD10-I70.0). Electronically Signed   By: Marijo Sanes M.D.   On: 07/11/2020 08:12   US Abdomen Limited  Result Date: 07/11/2020 CLINICAL DATA:  Right upper quadrant abdominal pain. EXAM: ULTRASOUND ABDOMEN LIMITED RIGHT UPPER QUADRANT COMPARISON:  Abdominal ultrasound 07/07/2020 FINDINGS: Gallbladder: A few small nonshadowing echogenic structures may be gallbladder polyps. I do not see any definite mobile mobile stones. No gallbladder wall thickening, pericholecystic fluid or sonographic Murphy sign to suggest acute cholecystitis. Common bile duct: Diameter: 4.7 mm Liver: Normal echogenicity. 2.0 x 1.0 cm left hepatic lobe cyst is noted and unchanged since prior CT scans. Portal vein is patent on color Doppler imaging with normal direction of blood flow towards the liver. Other: None. IMPRESSION: 1. Probable small gallbladder polyps. No definite mobile shadowing gallstones. 2. No findings for acute cholecystitis or biliary dilatation. 3. Normal liver except for a stable small simple cyst in the left hepatic lobe. Electronically Signed   By: Marijo Sanes M.D.   On: 07/11/2020 05:27    EKG: not done  Labs on Admission: I have personally reviewed the available labs and imaging studies at the time of the admission.  Pertinent labs:   Glucose 105 BUN 21/Creatinine 1.28/GFR 50 - improved AP 152 Albumin 3.4 Lipase 70 AST  56/ALT 60 - improved WBC 4.7 Hgb 10.7 UA: moderate LE   Assessment/Plan Principal Problem:  Failure to thrive in adult Active Problems:   MGUS (monoclonal gammopathy of unknown significance)   Paroxysmal atrial flutter (HCC)   COPD (chronic obstructive pulmonary disease) (HCC)   Hyperlipidemia   CKD (chronic kidney disease) stage 3, GFR 30-59 ml/min   RLQ abdominal pain   Gallbladder polyp    Failure to thrive -Will place in Observation status for now -Will request PT/OT evaluations -Nutrition consult -Palliative care consult -Uric acid level to r/o systemic gout -Check Dig level to r/o digoxin toxicity  RLQ abdominal pain -Patient reports prolonged RLQ pain -At oncology appt on 6/30, RUQ [place] was ordered and completed on 7/9 with apparent cirrhosis, ?portal HTN, distended GB with cholelithiasis -Patient was called with this info and family decided to bring him in for further evaluation -LFTs previously elevated, now improved -[place] with probable small gallbladder polyps - these are generally benign, not a reason for pain, and unlikely to need further evaluation based on his age -Abdominal CT performed and unremarkable - small renal/bladder calculi without obstruction -Suspect abdominal wall etiology for the pain and recommend abdominal binder - although patient reports that he has one and has tried this without improvement -He would prefer to be hospitalized but there does not appear to be a current reason for ongoing hospitalization at this time   MGUS -Last seen by Dr. Marin Olp on 6/30 -His labs appear to be stable  -Dr. Marin Olp voiced concern about his physical deterioration at last visit -His plan is to see him back in another 6 weeks  Afib -Rate controlled with Lopressor, Digoxin -Continue Eliquis for [facility]  HTN -Treated with Lopressor -However, he is also reportedly taking Midodrine, presumably for orthostatic hypotension - will continue  COPD -Continue Albuterol  prn -Continue daily revefenacin  HLD -Continue Lipitor, but continue to monitor LFTs and consider trial of d/c if LFTs increase again -Continue Niaspan  Stage 3a CKD -Appears to be stable at this time  Chronic diastolic CHF -Last echo was in 05/2017 and showed preserved EF with grade 2 diastolic dysfunction -Continue Lasix    Note: This patient has been tested and is pending for the novel coronavirus COVID-19.  DVT prophylaxis: Eliquis Code Status:  DNR - confirmed with patient/family Family Communication: None present; I spoke with the patient's daughters by telephone at the time of admission. Disposition Plan:  The patient is from: home  Anticipated d/c is to: home with [facility] services vs. SNF Anticipated d/c date will depend on clinical response to treatment, but possibly as early as tomorrow if he has excellent response to treatment  Patient is currently: medically stable Consults called: Palliative care; PT/OT/Nutrition Admission status:  It is my clinical opinion that referral for OBSERVATION is reasonable and necessary in this patient based on the above information provided. The aforementioned taken together are felt to place the patient at high risk for further clinical deterioration. However it is anticipated that the patient may be medically stable for discharge from the hospital within 24 to 48 hours.    Karmen Bongo MD [facility]   How to contact the [facility] Attending or Consulting provider [place] or covering provider during after hours [place], for this patient?  1. Check the care team in [facility] and look for a) attending/consulting [facility] provider listed and b) the [facility] team listed 2. Log into [email or web address] and use [place]'s universal password to access. If you do not have the password, please contact the hospital operator. 3. Locate the [facility] provider you  are looking for under [facility] and page to a number that you can be directly reached. 4. If you still have  difficulty reaching the provider, please page the [facility] (Director on Call) for the Hospitalists listed on amion for assistance.   07/11/2020, 10:28 AM

## 2020-07-11 NOTE — Evaluation (Signed)
Occupational Therapy Evaluation Patient Details Name: [name] MRN: [id number] DOB: [birth year] Today's Date: 07/11/2020    History of Present Illness Pt is a 86 y.o. male with medical history significant of pacemaker placement; afib; MGUS; CAD; and COPD presenting with abdominal pain.  He reports having 6 months of RLQ abdominal pain.  The pain is intermittent in nature and sharp with certain movements.  He has persistent nausea that he relates to the pain.  Additionally, pt with progressive weakness.  Pt's principal problem per H and P is failure to thrive and R LQ abdominal pain.   Clinical Impression   This 86 y/o male presents with the above. PTA pt reports being mod independent with basic ADL and functional mobility using SPC (rollator for outside the home). Pt currently requiring minguard-minA for functional transfers, up to [place] for LB ADL and setup/supervision for seated UB ADL. Pt mostly with limitations given overall weakness and unsteadiness in standing. He will benefit from continued acute [name] services and currently recommend SNF level therapy services after discharge to maximize his overall safety and independence with ADL and mobility.     Follow Up Recommendations  SNF;Supervision/Assistance - 24 hour    Equipment Recommendations  None recommended by [name];Other (comment) (defer to next venue)           Precautions / Restrictions Precautions Precautions: Fall Restrictions Weight Bearing Restrictions: No      Mobility Bed Mobility Overal bed mobility: Needs Assistance Bed Mobility: Supine to Sit;Sit to Supine     Supine to sit: Min guard Sit to supine: Min guard   General bed mobility comments: close guard for safety given pt performing on ED stretcher   Transfers Overall transfer level: Needs assistance Equipment used: None Transfers: Sit to/from Stand Sit to Stand: Min guard         General transfer comment: close guarding for safety and balance;  pt noted with LE shakiness with prolonged standing while using urinal     Balance Overall balance assessment: Needs assistance Sitting-balance support: No upper extremity supported Sitting balance-Leahy Scale: Good     Standing balance support: Single extremity supported Standing balance-Leahy Scale: Fair Standing balance comment: static balance wtih close minguard assist                            ADL either performed or assessed with clinical judgement   ADL Overall ADL's : Needs assistance/impaired Eating/Feeding: Modified independent;Sitting   Grooming: Set up;Supervision/safety;Sitting   Upper Body Bathing: Supervision/ safety;Sitting   Lower Body Bathing: Moderate assistance;Sit to/from stand   Upper Body Dressing : Set up;Sitting Upper Body Dressing Details (indicate cue type and reason): pt donning jacket while seated EOB Lower Body Dressing: Moderate assistance;Sit to/from stand Lower Body Dressing Details (indicate cue type and reason): minA for balance  Toilet Transfer: Minimal assistance;Stand-pivot Toilet Transfer Details (indicate cue type and reason): simulated via transfer to/from EOB Toileting- Clothing Manipulation and Hygiene: Minimal assistance;Sit to/from stand Toileting - Clothing Manipulation Details (indicate cue type and reason): pt standing to void bladder in urinal, minA for standing balance      Functional mobility during ADLs: Minimal assistance       Vision         Perception     Praxis      Pertinent Vitals/Pain Pain Assessment: Faces Faces Pain Scale: Hurts a little bit Pain Location: generalized Pain Descriptors / Indicators: Discomfort Pain Intervention(s): Monitored during  session;Repositioned     Hand Dominance     Extremity/Trunk Assessment Upper Extremity Assessment Upper Extremity Assessment: Generalized weakness   Lower Extremity Assessment Lower Extremity Assessment: Defer to PT evaluation        Communication Communication Communication: HOH (Deaf in R ear; talk loudly in left ear)   Cognition Arousal/Alertness: Awake/alert Behavior During Therapy: WFL for tasks assessed/performed Overall Cognitive Status: No family/caregiver present to determine baseline cognitive functioning                                 General Comments: following commands well and able to provide answers to PLOF, difficult to fully assess as pt HOH, is distractible   General Comments       Exercises     Shoulder Instructions      Home Living Family/patient expects to be discharged to:: Private residence Living Arrangements: Children Available Help at Discharge: Family;Available 24 hours/day Type of Home: House Home Access: Stairs to enter [employer] of Steps: 3 Entrance Stairs-Rails: Right;Left Home Layout: One level     Bathroom Shower/Tub: Teacher, early years/pre: Standard     Home Equipment: Clinical cytogeneticist - 4 wheels;Cane - single point          Prior Functioning/Environment Level of Independence: Independent with assistive device(s)  Gait / Transfers Assistance Needed: Reports uses cane in home; rollator for short community distance; reports 1 fall in past 6 months (states passed out) ADL's / Homemaking Assistance Needed: Reports independent with ADLs; daughter does IADLs   Comments: Reports daughter can assist as needed.  Pt reporting gradual weakness over the past 2 montsh.        [name] Problem List: Decreased strength;Decreased range of motion;Decreased activity tolerance;Impaired balance (sitting and/or standing);Decreased knowledge of use of DME or AE;Pain      [name] Treatment/Interventions: Self-care/ADL training;Therapeutic exercise;Energy conservation;DME and/or AE instruction;Therapeutic activities;Patient/family education;Balance training    [name] Goals(Current goals can be found in the care plan section) Acute Rehab [name] Goals Patient  Stated Goal: get stronger [name] Goal Formulation: With patient Time For Goal Achievement: 07/25/20 Potential to Achieve Goals: Good  [name] Frequency: Min 2X/week   Barriers to D/C:            Co-evaluation              AM-PAC [name] "6 Clicks" Daily Activity     Outcome Measure Help from another person eating meals?: None Help from another person taking care of personal grooming?: A Little Help from another person toileting, which includes using toliet, bedpan, or urinal?: A Little Help from another person bathing (including washing, rinsing, drying)?: A Lot Help from another person to put on and taking off regular upper body clothing?: A Little Help from another person to put on and taking off regular lower body clothing?: A Lot 6 Click Score: 17   End of Session Nurse Communication: Mobility status  Activity Tolerance: Patient tolerated treatment well Patient left: in bed;with call bell/phone within reach  [name] Visit Diagnosis: Muscle weakness (generalized) (M62.81);Unsteadiness on feet (R26.81)                Time: 4010-2725 [name] Time Calculation (min): 17 min Charges:  [name] General Charges $[name] Visit: 1 Visit [name] Evaluation $[name] Eval Moderate Complexity: [name], [name] Acute Rehabilitation Services Pager [phone] Office [phone]   [name] 07/11/2020, 5:30 PM

## 2020-07-12 ENCOUNTER — Observation Stay (HOSPITAL_COMMUNITY): Payer: Medicare Other

## 2020-07-12 DIAGNOSIS — E785 Hyperlipidemia, unspecified: Secondary | ICD-10-CM | POA: Diagnosis present

## 2020-07-12 DIAGNOSIS — I509 Heart failure, unspecified: Secondary | ICD-10-CM | POA: Diagnosis not present

## 2020-07-12 DIAGNOSIS — R41841 Cognitive communication deficit: Secondary | ICD-10-CM | POA: Diagnosis not present

## 2020-07-12 DIAGNOSIS — R278 Other lack of coordination: Secondary | ICD-10-CM | POA: Diagnosis not present

## 2020-07-12 DIAGNOSIS — K828 Other specified diseases of gallbladder: Secondary | ICD-10-CM | POA: Diagnosis not present

## 2020-07-12 DIAGNOSIS — J439 Emphysema, unspecified: Secondary | ICD-10-CM

## 2020-07-12 DIAGNOSIS — R1031 Right lower quadrant pain: Secondary | ICD-10-CM | POA: Diagnosis present

## 2020-07-12 DIAGNOSIS — R109 Unspecified abdominal pain: Secondary | ICD-10-CM | POA: Diagnosis present

## 2020-07-12 DIAGNOSIS — Z7189 Other specified counseling: Secondary | ICD-10-CM | POA: Diagnosis not present

## 2020-07-12 DIAGNOSIS — E860 Dehydration: Secondary | ICD-10-CM | POA: Diagnosis present

## 2020-07-12 DIAGNOSIS — I5032 Chronic diastolic (congestive) heart failure: Secondary | ICD-10-CM | POA: Diagnosis present

## 2020-07-12 DIAGNOSIS — R5381 Other malaise: Secondary | ICD-10-CM | POA: Diagnosis not present

## 2020-07-12 DIAGNOSIS — N183 Chronic kidney disease, stage 3 unspecified: Secondary | ICD-10-CM | POA: Diagnosis not present

## 2020-07-12 DIAGNOSIS — K6289 Other specified diseases of anus and rectum: Secondary | ICD-10-CM | POA: Diagnosis present

## 2020-07-12 DIAGNOSIS — Z7401 Bed confinement status: Secondary | ICD-10-CM | POA: Diagnosis not present

## 2020-07-12 DIAGNOSIS — J449 Chronic obstructive pulmonary disease, unspecified: Secondary | ICD-10-CM | POA: Diagnosis present

## 2020-07-12 DIAGNOSIS — R2681 Unsteadiness on feet: Secondary | ICD-10-CM | POA: Diagnosis not present

## 2020-07-12 DIAGNOSIS — M6259 Muscle wasting and atrophy, not elsewhere classified, multiple sites: Secondary | ICD-10-CM | POA: Diagnosis not present

## 2020-07-12 DIAGNOSIS — H9193 Unspecified hearing loss, bilateral: Secondary | ICD-10-CM | POA: Diagnosis present

## 2020-07-12 DIAGNOSIS — M2550 Pain in unspecified joint: Secondary | ICD-10-CM | POA: Diagnosis not present

## 2020-07-12 DIAGNOSIS — R627 Adult failure to thrive: Secondary | ICD-10-CM | POA: Diagnosis not present

## 2020-07-12 DIAGNOSIS — K766 Portal hypertension: Secondary | ICD-10-CM | POA: Diagnosis present

## 2020-07-12 DIAGNOSIS — D472 Monoclonal gammopathy: Secondary | ICD-10-CM

## 2020-07-12 DIAGNOSIS — R531 Weakness: Secondary | ICD-10-CM | POA: Diagnosis not present

## 2020-07-12 DIAGNOSIS — R2689 Other abnormalities of gait and mobility: Secondary | ICD-10-CM | POA: Diagnosis not present

## 2020-07-12 DIAGNOSIS — I4821 Permanent atrial fibrillation: Secondary | ICD-10-CM | POA: Diagnosis present

## 2020-07-12 DIAGNOSIS — G8929 Other chronic pain: Secondary | ICD-10-CM | POA: Diagnosis present

## 2020-07-12 DIAGNOSIS — R1012 Left upper quadrant pain: Secondary | ICD-10-CM | POA: Diagnosis not present

## 2020-07-12 DIAGNOSIS — R262 Difficulty in walking, not elsewhere classified: Secondary | ICD-10-CM | POA: Diagnosis not present

## 2020-07-12 DIAGNOSIS — N179 Acute kidney failure, unspecified: Secondary | ICD-10-CM | POA: Diagnosis not present

## 2020-07-12 DIAGNOSIS — Z515 Encounter for palliative care: Secondary | ICD-10-CM | POA: Diagnosis not present

## 2020-07-12 DIAGNOSIS — I672 Cerebral atherosclerosis: Secondary | ICD-10-CM | POA: Diagnosis present

## 2020-07-12 DIAGNOSIS — D638 Anemia in other chronic diseases classified elsewhere: Secondary | ICD-10-CM | POA: Diagnosis present

## 2020-07-12 DIAGNOSIS — I2510 Atherosclerotic heart disease of native coronary artery without angina pectoris: Secondary | ICD-10-CM | POA: Diagnosis present

## 2020-07-12 DIAGNOSIS — M6281 Muscle weakness (generalized): Secondary | ICD-10-CM | POA: Diagnosis not present

## 2020-07-12 DIAGNOSIS — Z20822 Contact with and (suspected) exposure to covid-19: Secondary | ICD-10-CM | POA: Diagnosis present

## 2020-07-12 DIAGNOSIS — N202 Calculus of kidney with calculus of ureter: Secondary | ICD-10-CM | POA: Diagnosis present

## 2020-07-12 DIAGNOSIS — Z66 Do not resuscitate: Secondary | ICD-10-CM | POA: Diagnosis present

## 2020-07-12 DIAGNOSIS — R1011 Right upper quadrant pain: Secondary | ICD-10-CM | POA: Diagnosis not present

## 2020-07-12 DIAGNOSIS — I130 Hypertensive heart and chronic kidney disease with heart failure and stage 1 through stage 4 chronic kidney disease, or unspecified chronic kidney disease: Secondary | ICD-10-CM | POA: Diagnosis not present

## 2020-07-12 DIAGNOSIS — K5900 Constipation, unspecified: Secondary | ICD-10-CM | POA: Diagnosis present

## 2020-07-12 DIAGNOSIS — N1831 Chronic kidney disease, stage 3a: Secondary | ICD-10-CM | POA: Diagnosis present

## 2020-07-12 DIAGNOSIS — D539 Nutritional anemia, unspecified: Secondary | ICD-10-CM | POA: Diagnosis present

## 2020-07-12 DIAGNOSIS — R1010 Upper abdominal pain, unspecified: Secondary | ICD-10-CM | POA: Diagnosis not present

## 2020-07-12 DIAGNOSIS — I4892 Unspecified atrial flutter: Secondary | ICD-10-CM | POA: Diagnosis not present

## 2020-07-12 LAB — BASIC METABOLIC PANEL
Anion gap: 12 (ref 5–15)
BUN: 27 mg/dL — ABNORMAL HIGH (ref 8–23)
CO2: 23 mmol/L (ref 22–32)
Calcium: 9.6 mg/dL (ref 8.9–10.3)
Chloride: 101 mmol/L (ref 98–111)
Creatinine, Ser: 1.47 mg/dL — ABNORMAL HIGH (ref 0.61–1.24)
GFR calc Af Amer: 49 mL/min — ABNORMAL LOW (ref >60)
GFR calc non Af Amer: 42 mL/min — ABNORMAL LOW (ref >60)
Glucose, Bld: 104 mg/dL — ABNORMAL HIGH (ref 70–99)
Potassium: 4.4 mmol/L (ref 3.5–5.1)
Sodium: 136 mmol/L (ref 135–145)

## 2020-07-12 LAB — CBC
HCT: 33.4 % — ABNORMAL LOW (ref 39.0–52.0)
Hemoglobin: 10.5 g/dL — ABNORMAL LOW (ref 13.0–17.0)
MCH: 31.6 pg (ref 26.0–34.0)
MCHC: 31.4 g/dL (ref 30.0–36.0)
MCV: 100.6 fL — ABNORMAL HIGH (ref 80.0–100.0)
Platelets: 143 10*3/uL — ABNORMAL LOW (ref 150–400)
RBC: 3.32 MIL/uL — ABNORMAL LOW (ref 4.22–5.81)
RDW: 14.1 % (ref 11.5–15.5)
WBC: 5.7 10*3/uL (ref 4.0–10.5)
nRBC: 0 % (ref 0.0–0.2)

## 2020-07-12 MED ORDER — SENNOSIDES-DOCUSATE SODIUM 8.6-50 MG PO TABS
1.0000 | ORAL_TABLET | Freq: Two times a day (BID) | ORAL | Status: DC
  Administered 2020-07-12 – 2020-07-17 (×10): 1 via ORAL
  Filled 2020-07-12 (×11): qty 1

## 2020-07-12 MED ORDER — FAMOTIDINE 20 MG PO TABS
20.0000 mg | ORAL_TABLET | Freq: Two times a day (BID) | ORAL | Status: DC
  Administered 2020-07-12 – 2020-07-17 (×9): 20 mg via ORAL
  Filled 2020-07-12 (×9): qty 1

## 2020-07-12 MED ORDER — ADULT MULTIVITAMIN W/MINERALS CH
1.0000 | ORAL_TABLET | Freq: Every day | ORAL | Status: DC
  Administered 2020-07-13 – 2020-07-17 (×5): 1 via ORAL
  Filled 2020-07-12 (×5): qty 1

## 2020-07-12 MED ORDER — ENSURE ENLIVE PO LIQD
237.0000 mL | Freq: Three times a day (TID) | ORAL | Status: DC
  Administered 2020-07-13 – 2020-07-17 (×8): 237 mL via ORAL

## 2020-07-12 MED ORDER — POLYETHYLENE GLYCOL 3350 17 G PO PACK: 17.0000 g | PACK | Freq: Every day | ORAL | Status: DC | PRN

## 2020-07-12 MED ORDER — TECHNETIUM TC 99M MEBROFENIN IV KIT
4.7000 | PACK | Freq: Once | INTRAVENOUS | Status: AC | PRN
  Administered 2020-07-12: 4.7 via INTRAVENOUS

## 2020-07-12 MED ORDER — SERTRALINE HCL 50 MG PO TABS
25.0000 mg | ORAL_TABLET | Freq: Every day | ORAL | Status: DC
  Administered 2020-07-13 – 2020-07-17 (×5): 25 mg via ORAL
  Filled 2020-07-12 (×5): qty 1

## 2020-07-12 MED ORDER — SODIUM CHLORIDE 0.9 % IV SOLN: INTRAVENOUS | Status: DC

## 2020-07-12 NOTE — Consult Note (Addendum)
[facility] Consult: 9:26 AM 07/13/2020  LOS: 1 day    Referring Provider: Dr Raelyn Mora.    Primary Care Physician:  Cyndi Bender, PA-C Primary Gastroenterologist:  Dr. Fuller Plan, 1 visit in 2018    Reason for Consultation:  Abdominal pain   HPI: [name] is a 86 y.o. male.  Hx hearing loss.  COPD.  MGUS. Anemia of chronic dz.    CAD.  Chronic Eliquis.  S/p pacemaker.  CKD.  LFTs elevated during admission for Klebsiella UTI in 2017 attributed to shock liver, seen in fup by Dr Fuller Plan 2018.    Previous EGD or colonoscopy unknown.  Since the patient is essentially deaf and his hearing aid looks to have fallen apart, communication was limited via notes and better information was obtained after discussion discussion with his daughter [name] over the phone. For some months the patient has complained of pain in his abdomen.  Its been located in the upper abdomen, the right and left abdomen.  Patient tells me the pain occurs when he moves and is on the right side.  In the last couple of weeks the pain is gotten more intense and he has developed postprandial nausea, vomiting, dry heaves.  Daughter says the pain seems to be triggered by oral intake which the patient does not endorse.  He has been seen twice by the PA at his primary care MD office.  Initially it must of been felt that it was musculoskeletal because he was referred to PT but he has yet to see PT.  More recent visit to the PA at which point he was vomiting and was near syncopal so he was sent to the ED.  He has bowel movements every few days.  He is not eating or drinking.  He is sleeping a lot, losing weight.  He had a gout flare in his hand about a month ago.   No PPI or H2 blocker at home.  LFTS since 6/30: t bili 0.8 >> 1.6 Alk phos 226 >> 152 >>  149 AST/ALT 31/80 >> 56/60 >> 39/49 Lipase 70 >> 46  CTAP w contrast: non obstr renal stones, left bladder stone and diverticulum. Sigmoid tics.   Advanced aortic and branch vessel vascular stenosis.  Stable spinal compression deformities in t12 an L1  RUQ ultrasound: likley small GB polyps.  No cholecystitis,  No gallstones.  Stable, small, simple cyst L liver lobe.    HIDA: slightly reduced GB EF at 28% as can be seen in chronic cholecystitis and billiary dyskinesia.  No acute cholecystitis or obstruction.    Today the patient is not experiencing pain.  Past Medical History:  Diagnosis Date  . ACS (acute coronary syndrome) ([place])   . Acute respiratory failure with hypoxia ([place]) 05/2017  . Anemia of renal disease 12/05/2011  . Anemia, iron deficiency 12/05/2011  . Asthma   . CAS (cerebral atherosclerosis)    Carotid Dopplers, 04/09/2012 - Bilateral Bulb/Proximal ICAs-mild to moderate amount of fibrous plaque of fibrous soft plaque w/o evidence of a significant diameter reduction,  dissection, or any other vascular abnormality  . COPD (chronic obstructive pulmonary disease) ([place])   . Coronary heart disease    a. s/p CABG in 2006 with LIMA-LAD, SVG-Cx, SVG-IM-OM b. low-risk NST in 08/2016  . Dysrhythmia 11/09/2013   SVT VS A FLUTTER   . Kidney stones   . MGUS (monoclonal gammopathy of unknown significance) 12/05/2011  . Orthostatic hypotension 09/10/2016  . PAF (paroxysmal atrial fibrillation) ([facility])    a. on Eliquis  . Shortness of breath   . SSS (sick sinus syndrome) ([place])    a. s/p PPM placement in 2015    Past Surgical History:  Procedure Laterality Date  . CARDIAC CATHETERIZATION  12/05/2005   Recommended CABG  . CARDIOVERSION N/A 11/09/2013   Procedure: CARDIOVERSION;  Surgeon: Pixie Casino, MD;  Location: [facility] ENDOSCOPY;  Service: Cardiovascular;  Laterality: N/A;  . CATARACT EXTRACTION    . CORONARY ARTERY BYPASS GRAFT  12/06/2005   x4, LIMA to LAD, vein to distal circumflex,  sequential vein to intermediate and obtuse marginal vessel  . INNER EAR SURGERY    . KIDNEY STONE SURGERY    . LOOP RECORDER IMPLANT N/A 11/30/2013   Procedure: LOOP RECORDER IMPLANT;  Surgeon: Sanda Klein, MD;  Location: [facility] CATH LAB;  Service: Cardiovascular;  Laterality: N/A;  . PERMANENT PACEMAKER INSERTION N/A 02/04/2014   Procedure: PERMANENT PACEMAKER INSERTION;  Surgeon: Sanda Klein, MD;  Location: [facility] CATH LAB;  Service: Cardiovascular;  Laterality: N/A;  . TEE WITHOUT CARDIOVERSION N/A 11/09/2013   Procedure: TRANSESOPHAGEAL ECHOCARDIOGRAM (TEE);  Surgeon: Pixie Casino, MD;  Location: [facility] ENDOSCOPY;  Service: Cardiovascular;  Laterality: N/A;    Prior to Admission medications   Medication Sig Start Date End Date Taking? Authorizing Provider  acetaminophen (TYLENOL) 650 MG CR tablet Take 1,300 mg by mouth every 8 (eight) hours as needed for pain.   Yes [provider]  apixaban (ELIQUIS) 2.5 MG TABS tablet Take 1 tablet (2.5 mg total) by mouth 2 (two) times daily. 06/09/20  Yes Troy Sine, MD  atorvastatin (LIPITOR) 40 MG tablet TAKE 1 TABLET (40 MG TOTAL) BY MOUTH DAILY. KEEP OV. Patient taking differently: Take 40 mg by mouth daily.  12/06/19  Yes Troy Sine, MD  bisacodyl (BISACODYL) 5 MG EC tablet Take 5 mg by mouth as needed for mild constipation or moderate constipation.   Yes [provider]  Chloramphenicol Palmitate POWD Place 3 puffs into both ears daily.    Yes [provider]  cycloSPORINE (RESTASIS) 0.05 % ophthalmic emulsion Place 1 drop into both eyes 2 (two) times daily.  01/21/20  Yes [provider]  digoxin (LANOXIN) 0.125 MG tablet Take 0.125 mg by mouth daily.    Yes [provider]  diphenhydramine-acetaminophen (TYLENOL PM) 25-500 MG TABS Take 2 tablets by mouth at bedtime.    Yes [provider]  ENSURE (ENSURE) Take 237 mLs by mouth 2 (two) times daily between meals.    Yes [provider]  furosemide (LASIX) 40 MG tablet TAKE 1.5 TABLETS (60 MG TOTAL) BY MOUTH DAILY. KEEP OV. Patient taking differently: Take 60 mg by mouth daily.  01/05/20  Yes Troy Sine, MD  Methylcellulose, Laxative, (CITRUCEL) 500 MG TABS Take 1,000 mg by mouth at bedtime.   Yes [provider]  metoprolol tartrate (LOPRESSOR) 50 MG tablet TAKE 1.5 TABLETS (75 MG TOTAL) BY MOUTH 2 (TWO) TIMES DAILY. 07/06/20  Yes Troy Sine, MD  midodrine (PROAMATINE) 2.5 MG  tablet Take 1 tablet (2.5 mg total) by mouth 2 (two) times daily with a meal. 12/07/19  Yes Troy Sine, MD  niacin (NIASPAN) 500 MG CR tablet TAKE 1 TABLET BY MOUTH AT BEDTIME. MUST MAKE OFFICE APPOINTMENT Patient taking differently: Take 500 mg by mouth at bedtime.  05/10/20  Yes Troy Sine, MD  PROAIR HFA 108 [id number] Base) MCG/ACT inhaler INHALE 2 PUFFS INTO THE  LUNGS EVERY 4 HOURS AS  NEEDED FOR WHEEZING OR  SHORTNESS OF BREATH Patient taking differently: Inhale 2 puffs into the lungs every 4 (four) hours as needed for wheezing or shortness of breath.  04/20/18  Yes Byrum, Rose Fillers, MD  Probiotic Product ([employer] SENIOR PROBIOTIC PO) Take 1 capsule by mouth at bedtime. Once daily   Yes [provider]  revefenacin (YUPELRI) 175 MCG/3ML nebulizer solution Inhale 3 mLs (175 mcg total) into the lungs daily. 04/06/20  Yes Collene Gobble, MD  sertraline (ZOLOFT) 25 MG tablet Take 25 mg by mouth daily. 06/29/20  Yes [provider]  SIMBRINZA 1-0.2 % SUSP Place 1 drop into both eyes 2 (two) times daily.  10/12/19  Yes [provider]  Tamsulosin HCl (FLOMAX) 0.4 MG CAPS Take 0.4 mg by mouth at bedtime.    Yes [provider]    Scheduled Meds: . acidophilus  1 capsule Oral QHS  . atorvastatin  40 mg Oral Daily  . brimonidine  1 drop Both Eyes TID  . brinzolamide  1 drop Both Eyes TID  . cycloSPORINE  1 drop Both Eyes BID  . digoxin  0.0625 mg Oral Daily  . famotidine  20 mg Oral BID  . feeding supplement  (ENSURE ENLIVE)  237 mL Oral TID BM  . metoprolol tartrate  75 mg Oral BID  . midodrine  2.5 mg Oral BID WC  . multivitamin with minerals  1 tablet Oral Daily  . niacin  500 mg Oral QHS  . revefenacin  175 mcg Nebulization Daily  . senna-docusate  1 tablet Oral BID  . sertraline  25 mg Oral Daily  . tamsulosin  0.4 mg Oral QHS   Infusions: . sodium chloride 75 mL/hr at 07/13/20 0737   PRN Meds: acetaminophen **OR** acetaminophen, albuterol, ondansetron **OR** ondansetron (ZOFRAN) IV, polyethylene glycol   Allergies as of 07/10/2020  . (No Known Allergies)    Family History  Problem Relation Age of Onset  . Tuberculosis Father   . Heart attack Father   . Heart disease Mother   . Heart attack Mother   . Heart disease Brother   . Emphysema Daughter        premature without tobacco exposure    Social History   Socioeconomic History  . Marital status: Married    Spouse name: Not on file  . Number of children: Not on file  . Years of education: Not on file  . Highest education level: Not on file  Occupational History  . Occupation: retired Arts administrator  . Occupation: maintenence  Tobacco Use  . Smoking status: Former Smoker    Packs/day: 1.00    Years: 47.00    Pack years: 47.00    Types: Cigarettes    Start date: 12/02/1951    Quit date: 12/30/1993    Years since quitting: 26.5  . Smokeless tobacco: Never Used  . Tobacco comment: quit 20 years ago  Vaping Use  . Vaping Use: Never used  Substance and Sexual Activity  . Alcohol  use: No    Alcohol/week: 0.0 standard drinks  . Drug use: No  . Sexual activity: Not on file  Other Topics Concern  . Not on file  Social History Narrative   Pt lives with daughter.       REVIEW OF SYSTEMS: Constitutional: Weakness ENT:  No nose bleeds Pulm: No difficulty breathing CV:  No palpitations, no LE edema.  GU:  No hematuria, no frequency GI: See HPI. Heme: No unusual bleeding or bruising. Transfusions:  None Neuro:  No headaches, no peripheral tingling or numbness.   Positive dizziness. Derm:  No itching, no rash or sores.  Endocrine:  No sweats or chills.  No polyuria or dysuria Immunization: Not known Travel:  None beyond local counties in last few months.    PHYSICAL EXAM: Vital signs in last 24 hours: Vitals:   07/12/20 2044 07/13/20 0807  BP: 135/65   Pulse: 82   Resp: 17   Temp: 98.2 F (36.8 C)   SpO2: 95% 96%   Wt Readings from Last 3 Encounters:  07/10/20 73.5 kg  06/28/20 74.4 kg  04/06/20 78.7 kg    General: Thin, aged, deaf.  Resting comfortably in bed Head: No facial asymmetry, no signs of head trauma Eyes: No scleral icterus.  No conjunctival pallor. Ears: Deaf.  Has one hearing aid which is disassembled and patient cannot get it put back together Nose: No congestion or discharge Mouth: Moist, clear, pink.  Tongue midline.  Fair dentition. Neck: No JVD, no masses, no thyromegaly Lungs: Clear bilaterally.  No cough, no labored breathing. Heart: RRR. Abdomen: Soft.  Not tender.  Not distended.  No HSM, masses, bruits, hernias.   Musc/Skeltl: No joint swelling, redness or gross deformity Extremities: No CCE Neurologic: Unable to really test his neurologic function since I cannot speak with him but he is able to sit up in the bed independently with some effort.  Moves all 4 limbs.  No tremors. Skin: No telangiectasia, rash, sores      LAB RESULTS: Recent Labs    07/10/20 1649 07/12/20 0244 07/13/20 0330  WBC 4.7 5.7 6.0  HGB 10.7* 10.5* 10.2*  HCT 32.7* 33.4* 31.5*  PLT 160 143* 151   BMET Lab Results  Component Value Date   NA 136 07/13/2020   NA 136 07/12/2020   NA 139 07/10/2020   K 4.3 07/13/2020   K 4.4 07/12/2020   K 4.4 07/10/2020   CL 100 07/13/2020   CL 101 07/12/2020   CL 105 07/10/2020   CO2 24 07/13/2020   CO2 23 07/12/2020   CO2 24 07/10/2020   GLUCOSE 102 (H) 07/13/2020   GLUCOSE 104 (H) 07/12/2020   GLUCOSE 105 (H)  07/10/2020   BUN 39 (H) 07/13/2020   BUN 27 (H) 07/12/2020   BUN 21 07/10/2020   CREATININE 1.82 (H) 07/13/2020   CREATININE 1.47 (H) 07/12/2020   CREATININE 1.28 (H) 07/10/2020   CALCIUM 9.3 07/13/2020   CALCIUM 9.6 07/12/2020   CALCIUM 9.3 07/10/2020   LFT Recent Labs    07/10/20 1649 07/13/20 0330  PROT 6.4* 6.6  ALBUMIN 3.4* 3.4*  AST 56* 39  ALT 60* 49*  ALKPHOS 152* 149*  BILITOT 0.8 1.6*   PT/INR Lab Results  Component Value Date   INR 1.90 12/02/2016   INR 1.86 12/01/2016   INR 1.26 01/27/2014   Lipase     Component Value Date/Time   LIPASE 46 07/13/2020 0330      RADIOLOGY STUDIES:  NM Hepato W/EF  Result Date: 07/12/2020 CLINICAL DATA:  Upper abdominal pain EXAM: NUCLEAR MEDICINE HEPATOBILIARY IMAGING WITH GALLBLADDER EF TECHNIQUE: Sequential images of the abdomen were obtained out to 60 minutes following intravenous administration of radiopharmaceutical. After oral ingestion of Ensure, gallbladder ejection fraction was determined. At 60 min, normal ejection fraction is greater than 33%. RADIOPHARMACEUTICALS:  4.7 mCi Tc-55m Choletec IV COMPARISON:  07/11/2020 FINDINGS: Prompt uptake and biliary excretion of activity by the liver is seen. Gallbladder activity is visualized, consistent with patency of cystic duct. Biliary activity passes into small bowel, consistent with patent common bile duct. Calculated gallbladder ejection fraction is 28%. (Normal gallbladder ejection fraction with Ensure is greater than 33%.) IMPRESSION: 1. No evidence of acute cholecystitis or biliary obstruction. 2. Slightly diminished ejection fraction which can be seen with chronic cholecystitis or biliary dyskinesia. Electronically Signed   By: MRanda NgoM.D.   On: 07/12/2020 17:56      IMPRESSION:   *   Migrating Abdominal pain. Elevated LFTs.   Diminished gallbladder EF on HIDA scan, likely small gallbladder polyps and uncomplicated liver cyst on ultrasound.  GI findings  unremarkable other than sigmoid diverticulosis on CT but this did reveal some advanced aortic and branch vessel stenosis, so question mesenteric ischemia?  *   Macrocytic anemia  *    Hearing loss  *    Chronic Eliquis.  Last dose 7/12.   PLAN:     *   Would obtain surgical evaluation regarding opinion about his potential chronic cholecystitis.  *   Mesenteric angiography???  *   EGD??   *    Acid suppression being covered currently with Pepcid 20 mg bid, consider switching to PPI.   SAzucena Freed 07/13/2020, 9:26 AM Phone [phone]    [facility] Attending   I have taken an interval history, reviewed the chart and examined the patient. I agree with the Advanced Practitioner's note, impression and recommendations.     Hx, PE and studies do not lead me to a unifying dx here.  I think he probably has gallstones based upon his multiple UKoreatests+ sl low GB EF, but his pain that is bilateral is not GB in origin, I suspect.  Has a urinary bladder stone but that is chronic (seen on old CT) I suspect so dpubt he passed a stone.  He is intermittently tender R/LUQ areas but muscle tension did not lead to any increase or tenderness at all.  Spoke to him and daughter Lance Go- will do an EGD to see if that sheds any light on his problems. If it is negative I cannot think of other evaluation at this time.    CGatha Mayer MD, [facility] 07/13/2020 5:33 PM

## 2020-07-12 NOTE — H&P (View-Only) (Signed)
                                                                           [facility] Consult: 9:26 AM 07/13/2020  LOS: 1 day    Referring Provider: Dr K Ghimire.    Primary Care Physician:  Conroy, Nathan, PA-C Primary Gastroenterologist:  Dr. Stark, 1 visit in 2018    Reason for Consultation:  Abdominal pain   HPI: [name] is a 86 y.o. male.  Hx hearing loss.  COPD.  MGUS. Anemia of chronic dz.    CAD.  Chronic Eliquis.  S/p pacemaker.  CKD.  LFTs elevated during admission for Klebsiella UTI in 2017 attributed to shock liver, seen in fup by Dr Stark 2018.    Previous EGD or colonoscopy unknown.  Since the patient is essentially deaf and his hearing aid looks to have fallen apart, communication was limited via notes and better information was obtained after discussion discussion with his daughter [name] over the phone. For some months the patient has complained of pain in his abdomen.  Its been located in the upper abdomen, the right and left abdomen.  Patient tells me the pain occurs when he moves and is on the right side.  In the last couple of weeks the pain is gotten more intense and he has developed postprandial nausea, vomiting, dry heaves.  Daughter says the pain seems to be triggered by oral intake which the patient does not endorse.  He has been seen twice by the PA at his primary care MD office.  Initially it must of been felt that it was musculoskeletal because he was referred to PT but he has yet to see PT.  More recent visit to the PA at which point he was vomiting and was near syncopal so he was sent to the ED.  He has bowel movements every few days.  He is not eating or drinking.  He is sleeping a lot, losing weight.  He had a gout flare in his hand about a month ago.   No PPI or H2 blocker at home.  LFTS since 6/30: t bili 0.8 >> 1.6 Alk phos 226 >> 152 >>  149 AST/ALT 31/80 >> 56/60 >> 39/49 Lipase 70 >> 46  CTAP w contrast: non obstr renal stones, left bladder stone and diverticulum. Sigmoid tics.   Advanced aortic and branch vessel vascular stenosis.  Stable spinal compression deformities in t12 an L1  RUQ ultrasound: likley small GB polyps.  No cholecystitis,  No gallstones.  Stable, small, simple cyst L liver lobe.    HIDA: slightly reduced GB EF at 28% as can be seen in chronic cholecystitis and billiary dyskinesia.  No acute cholecystitis or obstruction.    Today the patient is not experiencing pain.  Past Medical History:  Diagnosis Date  . ACS (acute coronary syndrome) (HCC)   . Acute respiratory failure with hypoxia (HCC) 05/2017  . Anemia of renal disease 12/05/2011  . Anemia, iron deficiency 12/05/2011  . Asthma   . CAS (cerebral atherosclerosis)    Carotid Dopplers, 04/09/2012 - Bilateral Bulb/Proximal ICAs-mild to moderate amount of fibrous plaque of fibrous soft plaque w/o evidence of a significant diameter reduction,   dissection, or any other vascular abnormality  . COPD (chronic obstructive pulmonary disease) (HCC)   . Coronary heart disease    a. s/p CABG in 2006 with LIMA-LAD, SVG-Cx, SVG-IM-OM b. low-risk NST in 08/2016  . Dysrhythmia 11/09/2013   SVT VS A FLUTTER   . Kidney stones   . MGUS (monoclonal gammopathy of unknown significance) 12/05/2011  . Orthostatic hypotension 09/10/2016  . PAF (paroxysmal atrial fibrillation) (HCC)    a. on Eliquis  . Shortness of breath   . SSS (sick sinus syndrome) (HCC)    a. s/p PPM placement in 2015    Past Surgical History:  Procedure Laterality Date  . CARDIAC CATHETERIZATION  12/05/2005   Recommended CABG  . CARDIOVERSION N/A 11/09/2013   Procedure: CARDIOVERSION;  Surgeon: Kenneth C. Hilty, MD;  Location: [facility];  Service: Cardiovascular;  Laterality: N/A;  . CATARACT EXTRACTION    . CORONARY ARTERY BYPASS GRAFT  12/06/2005   x4, LIMA to LAD, vein to distal circumflex,  sequential vein to intermediate and obtuse marginal vessel  . INNER EAR SURGERY    . KIDNEY STONE SURGERY    . LOOP RECORDER IMPLANT N/A 11/30/2013   Procedure: LOOP RECORDER IMPLANT;  Surgeon: Mihai Croitoru, MD;  Location: [facility] CATH LAB;  Service: Cardiovascular;  Laterality: N/A;  . PERMANENT PACEMAKER INSERTION N/A 02/04/2014   Procedure: PERMANENT PACEMAKER INSERTION;  Surgeon: Mihai Croitoru, MD;  Location: [facility] CATH [facility];  Service: Cardiovascular;  Laterality: N/A;  . TEE WITHOUT CARDIOVERSION N/A 11/09/2013   Procedure: TRANSESOPHAGEAL ECHOCARDIOGRAM (TEE);  Surgeon: Kenneth C. Hilty, MD;  Location: [facility];  Service: Cardiovascular;  Laterality: N/A;    Prior to Admission medications   Medication Sig Start Date End Date Taking? Authorizing Provider  acetaminophen (TYLENOL) 650 MG CR tablet Take 1,300 mg by mouth every 8 (eight) hours as needed for pain.   Yes [provider]  apixaban (ELIQUIS) 2.5 MG TABS tablet Take 1 tablet (2.5 mg total) by mouth 2 (two) times daily. 06/09/20  Yes Kelly, Thomas A, MD  atorvastatin (LIPITOR) 40 MG tablet TAKE 1 TABLET (40 MG TOTAL) BY MOUTH DAILY. KEEP OV. Patient taking differently: Take 40 mg by mouth daily.  12/06/19  Yes Kelly, Thomas A, MD  bisacodyl (BISACODYL) 5 MG EC tablet Take 5 mg by mouth as needed for mild constipation or moderate constipation.   Yes [provider]  Chloramphenicol Palmitate POWD Place 3 puffs into both ears daily.    Yes [provider]  cycloSPORINE (RESTASIS) 0.05 % ophthalmic emulsion Place 1 drop into both eyes 2 (two) times daily.  01/21/20  Yes [provider]  digoxin (LANOXIN) 0.125 MG tablet Take 0.125 mg by mouth daily.    Yes [provider]  diphenhydramine-acetaminophen (TYLENOL PM) 25-500 MG TABS Take 2 tablets by mouth at bedtime.    Yes [provider]  ENSURE (ENSURE) Take 237 mLs by mouth 2 (two) times daily between meals.    Yes [provider]  furosemide (LASIX) 40 MG tablet TAKE 1.5 TABLETS (60 MG TOTAL) BY MOUTH DAILY. KEEP OV. Patient taking differently: Take 60 mg by mouth daily.  01/05/20  Yes Kelly, Thomas A, MD  Methylcellulose, Laxative, (CITRUCEL) 500 MG TABS Take 1,000 mg by mouth at bedtime.   Yes [provider]  metoprolol tartrate (LOPRESSOR) 50 MG tablet TAKE 1.5 TABLETS (75 MG TOTAL) BY MOUTH 2 (TWO) TIMES DAILY. 07/06/20  Yes Kelly, Thomas A, MD  midodrine (PROAMATINE) 2.5 MG   tablet Take 1 tablet (2.5 mg total) by mouth 2 (two) times daily with a meal. 12/07/19  Yes Kelly, Thomas A, MD  niacin (NIASPAN) 500 MG CR tablet TAKE 1 TABLET BY MOUTH AT BEDTIME. MUST MAKE OFFICE APPOINTMENT Patient taking differently: Take 500 mg by mouth at bedtime.  05/10/20  Yes Kelly, Thomas A, MD  PROAIR HFA 108 (90 Base) MCG/ACT inhaler INHALE 2 PUFFS INTO THE  LUNGS EVERY 4 HOURS AS  NEEDED FOR WHEEZING OR  SHORTNESS OF BREATH Patient taking differently: Inhale 2 puffs into the lungs every 4 (four) hours as needed for wheezing or shortness of breath.  04/20/18  Yes Byrum, Robert S, MD  Probiotic Product ([employer] SENIOR PROBIOTIC PO) Take 1 capsule by mouth at bedtime. Once daily   Yes [provider]  revefenacin (YUPELRI) 175 MCG/3ML nebulizer solution Inhale 3 mLs (175 mcg total) into the lungs daily. 04/06/20  Yes Byrum, Robert S, MD  sertraline (ZOLOFT) 25 MG tablet Take 25 mg by mouth daily. 06/29/20  Yes [provider]  SIMBRINZA 1-0.2 % SUSP Place 1 drop into both eyes 2 (two) times daily.  10/12/19  Yes [provider]  Tamsulosin HCl (FLOMAX) 0.4 MG CAPS Take 0.4 mg by mouth at bedtime.    Yes [provider]    Scheduled Meds: . acidophilus  1 capsule Oral QHS  . atorvastatin  40 mg Oral Daily  . brimonidine  1 drop Both Eyes TID  . brinzolamide  1 drop Both Eyes TID  . cycloSPORINE  1 drop Both Eyes BID  . digoxin  0.0625 mg Oral Daily  . famotidine  20 mg Oral BID  . feeding supplement  (ENSURE ENLIVE)  237 mL Oral TID BM  . metoprolol tartrate  75 mg Oral BID  . midodrine  2.5 mg Oral BID WC  . multivitamin with minerals  1 tablet Oral Daily  . niacin  500 mg Oral QHS  . revefenacin  175 mcg Nebulization Daily  . senna-docusate  1 tablet Oral BID  . sertraline  25 mg Oral Daily  . tamsulosin  0.4 mg Oral QHS   Infusions: . sodium chloride 75 mL/hr at 07/13/20 0737   PRN Meds: acetaminophen **OR** acetaminophen, albuterol, ondansetron **OR** ondansetron (ZOFRAN) IV, polyethylene glycol   Allergies as of 07/10/2020  . (No Known Allergies)    Family History  Problem Relation Age of Onset  . Tuberculosis Father   . Heart attack Father   . Heart disease Mother   . Heart attack Mother   . Heart disease Brother   . Emphysema Daughter        premature without tobacco exposure    Social History   Socioeconomic History  . Marital status: Married    Spouse name: Not on file  . Number of children: Not on file  . Years of education: Not on file  . Highest education level: Not on file  Occupational History  . Occupation: retired long distance truck driver  . Occupation: maintenence  Tobacco Use  . Smoking status: Former Smoker    Packs/day: 1.00    Years: 47.00    Pack years: 47.00    Types: Cigarettes    Start date: 12/02/1951    Quit date: 12/30/1993    Years since quitting: 26.5  . Smokeless tobacco: Never Used  . Tobacco comment: quit 20 years ago  Vaping Use  . Vaping Use: Never used  Substance and Sexual Activity  . Alcohol   use: No    Alcohol/week: 0.0 standard drinks  . Drug use: No  . Sexual activity: Not on file  Other Topics Concern  . Not on file  Social History Narrative   Pt lives with daughter.       REVIEW OF SYSTEMS: Constitutional: Weakness ENT:  No nose bleeds Pulm: No difficulty breathing CV:  No palpitations, no LE edema.  GU:  No hematuria, no frequency GI: See HPI. Heme: No unusual bleeding or bruising. Transfusions:  None Neuro:  No headaches, no peripheral tingling or numbness.   Positive dizziness. Derm:  No itching, no rash or sores.  Endocrine:  No sweats or chills.  No polyuria or dysuria Immunization: Not known Travel:  None beyond local counties in last few months.    PHYSICAL EXAM: Vital signs in last 24 hours: Vitals:   07/12/20 2044 07/13/20 0807  BP: 135/65   Pulse: 82   Resp: 17   Temp: 98.2 F (36.8 C)   SpO2: 95% 96%   Wt Readings from Last 3 Encounters:  07/10/20 73.5 kg  06/28/20 74.4 kg  04/06/20 78.7 kg    General: Thin, aged, deaf.  Resting comfortably in bed Head: No facial asymmetry, no signs of head trauma Eyes: No scleral icterus.  No conjunctival pallor. Ears: Deaf.  Has one hearing aid which is disassembled and patient cannot get it put back together Nose: No congestion or discharge Mouth: Moist, clear, pink.  Tongue midline.  Fair dentition. Neck: No JVD, no masses, no thyromegaly Lungs: Clear bilaterally.  No cough, no labored breathing. Heart: RRR. Abdomen: Soft.  Not tender.  Not distended.  No HSM, masses, bruits, hernias.   Musc/Skeltl: No joint swelling, redness or gross deformity Extremities: No CCE Neurologic: Unable to really test his neurologic function since I cannot speak with him but he is able to sit up in the bed independently with some effort.  Moves all 4 limbs.  No tremors. Skin: No telangiectasia, rash, sores      LAB RESULTS: Recent Labs    07/10/20 1649 07/12/20 0244 07/13/20 0330  WBC 4.7 5.7 6.0  HGB 10.7* 10.5* 10.2*  HCT 32.7* 33.4* 31.5*  PLT 160 143* 151   BMET Lab Results  Component Value Date   NA 136 07/13/2020   NA 136 07/12/2020   NA 139 07/10/2020   K 4.3 07/13/2020   K 4.4 07/12/2020   K 4.4 07/10/2020   CL 100 07/13/2020   CL 101 07/12/2020   CL 105 07/10/2020   CO2 24 07/13/2020   CO2 23 07/12/2020   CO2 24 07/10/2020   GLUCOSE 102 (H) 07/13/2020   GLUCOSE 104 (H) 07/12/2020   GLUCOSE 105 (H)  07/10/2020   BUN 39 (H) 07/13/2020   BUN 27 (H) 07/12/2020   BUN 21 07/10/2020   CREATININE 1.82 (H) 07/13/2020   CREATININE 1.47 (H) 07/12/2020   CREATININE 1.28 (H) 07/10/2020   CALCIUM 9.3 07/13/2020   CALCIUM 9.6 07/12/2020   CALCIUM 9.3 07/10/2020   LFT Recent Labs    07/10/20 1649 07/13/20 0330  PROT 6.4* 6.6  ALBUMIN 3.4* 3.4*  AST 56* 39  ALT 60* 49*  ALKPHOS 152* 149*  BILITOT 0.8 1.6*   PT/INR Lab Results  Component Value Date   INR 1.90 12/02/2016   INR 1.86 12/01/2016   INR 1.26 01/27/2014   Lipase     Component Value Date/Time   LIPASE 46 07/13/2020 0330      RADIOLOGY STUDIES:   NM Hepato W/EF  Result Date: 07/12/2020 CLINICAL DATA:  Upper abdominal pain EXAM: NUCLEAR MEDICINE HEPATOBILIARY IMAGING WITH GALLBLADDER EF TECHNIQUE: Sequential images of the abdomen were obtained out to 60 minutes following intravenous administration of radiopharmaceutical. After oral ingestion of Ensure, gallbladder ejection fraction was determined. At 60 min, normal ejection fraction is greater than 33%. RADIOPHARMACEUTICALS:  4.7 mCi Tc-55m Choletec IV COMPARISON:  07/11/2020 FINDINGS: Prompt uptake and biliary excretion of activity by the liver is seen. Gallbladder activity is visualized, consistent with patency of cystic duct. Biliary activity passes into small bowel, consistent with patent common bile duct. Calculated gallbladder ejection fraction is 28%. (Normal gallbladder ejection fraction with Ensure is greater than 33%.) IMPRESSION: 1. No evidence of acute cholecystitis or biliary obstruction. 2. Slightly diminished ejection fraction which can be seen with chronic cholecystitis or biliary dyskinesia. Electronically Signed   By: MRanda NgoM.D.   On: 07/12/2020 17:56      IMPRESSION:   *   Migrating Abdominal pain. Elevated LFTs.   Diminished gallbladder EF on HIDA scan, likely small gallbladder polyps and uncomplicated liver cyst on ultrasound.  GI findings  unremarkable other than sigmoid diverticulosis on CT but this did reveal some advanced aortic and branch vessel stenosis, so question mesenteric ischemia?  *   Macrocytic anemia  *    Hearing loss  *    Chronic Eliquis.  Last dose 7/12.   PLAN:     *   Would obtain surgical evaluation regarding opinion about his potential chronic cholecystitis.  *   Mesenteric angiography???  *   EGD??   *    Acid suppression being covered currently with Pepcid 20 mg bid, consider switching to PPI.   SAzucena Freed 07/13/2020, 9:26 AM Phone [phone]    [facility] Attending   I have taken an interval history, reviewed the chart and examined the patient. I agree with the Advanced Practitioner's note, impression and recommendations.     Hx, PE and studies do not lead me to a unifying dx here.  I think he probably has gallstones based upon his multiple UKoreatests+ sl low GB EF, but his pain that is bilateral is not GB in origin, I suspect.  Has a urinary bladder stone but that is chronic (seen on old CT) I suspect so dpubt he passed a stone.  He is intermittently tender R/LUQ areas but muscle tension did not lead to any increase or tenderness at all.  Spoke to him and daughter JArville Fuller- will do an EGD to see if that sheds any light on his problems. If it is negative I cannot think of other evaluation at this time.    CGatha Mayer MD, [facility] 07/13/2020 5:33 PM

## 2020-07-12 NOTE — Progress Notes (Signed)
Initial Nutrition Assessment  DOCUMENTATION CODES:   Not applicable  INTERVENTION:    Ensure Enlive po TID, each supplement provides 350 kcal and 20 grams of protein.  MVI with minerals daily.  NUTRITION DIAGNOSIS:   Inadequate oral intake related to decreased appetite (related to abdominal pain) as evidenced by percent weight loss.  GOAL:   Patient will meet greater than or equal to 90% of their needs  MONITOR:   PO intake, Supplement acceptance, Labs  REASON FOR ASSESSMENT:   Consult Assessment of nutrition requirement/status  ASSESSMENT:   86 yo male admitted with worsening abdominal pain and failure to thrive. PMH includes A fib, pacemaker, MGUS, CAD, COPD.   Patient has recently been eating poorly and sleeping excessively with progressive weakness and difficulty ambulating. He has had off and on abdominal pain for 6 months. Ultrasound in ED showed small gallbladder polyp, no acute cholecystitis.  Medical work-up is ongoing. Gastroenterology has been consulted. Aggressive bowel regimen added today.   Labs reviewed.  Medications reviewed and include acidophilus, senokot-s BID, flomax.  Unable to speak with patient today.  Usual weights reviewed. 78.7 kg 04/06/20 to 73.5 kg 07/10/20. 6.6% weight loss within 3 months is not significant for the time frame. Given history of poor intake, patient is at increased nutrition risk. Unable to obtain enough information at this time for identification of malnutrition.   NUTRITION - FOCUSED PHYSICAL EXAM:  unable to complete  Diet Order:   Diet Order            Diet regular Room service appropriate? Yes; Fluid consistency: Thin  Diet effective now                 EDUCATION NEEDS:   Not appropriate for education at this time  Skin:  Skin Assessment: Reviewed RN Assessment  Last BM:  7/7 per RN documentation  Height:   Ht Readings from Last 1 Encounters:  07/10/20 5\' 9"  (1.753 m)    Weight:   Wt Readings  from Last 1 Encounters:  07/10/20 73.5 kg    Ideal Body Weight:  72.7 kg  BMI:  Body mass index is 23.92 kg/m.  Estimated Nutritional Needs:   Kcal:  1800-2000  Protein:  90-100 gm  Fluid:  1.8-2 L    Lucas Mallow, RD, LDN, CNSC Please refer to Amion for contact information.

## 2020-07-12 NOTE — Consult Note (Signed)
Consultation Note Date: 07/12/2020   Patient Name: [name]  DOB: 07-30-1929  MRN: [id number]  Age / Sex: 86 y.o., male  PCP: Cyndi Bender, PA-C Referring Physician: Barb Merino, MD  Reason for Consultation: Establishing goals of care  HPI/Patient Profile: 86 y.o. male  with past medical history of coronary artery disease, COPD, atrial fibrillation, permanent pacemaker placement, anemia, and CKD stage III. 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 Marin Olp. He presented to the ED 07/10/2020 with abdominal pain.  Per intake H&P, patient has had RLQ pain for 6 months. Patient's daughter also reporting progressive weakness and overall declining for the last few months. In the ED, lipase elevated and mild LFT elevation. CT showed no acute abdominal/pelvic findings. Palliative care was consulted to discuss goals of care.  Clinical Assessment and Goals of Care: I have reviewed medical records including EPIC notes, labs and imaging, examined the patient. Bedside RN reports his last BM was 1 week ago.  I speak with daughters [name] and [name] by phone to discuss diagnosis, prognosis, GOC, EOL wishes, disposition, and options.  I introduced Palliative Medicine as specialized medical care for people living with serious illness. It focuses on providing relief from the symptoms and stress of a serious illness.   We discussed a brief life review of the patient. He is originally from [place], moved to [place] about 25 years ago. He is a retired Programmer, systems. His wife died several years ago. There are 2 daughters. [name] currently lives with her father, while [name] lives in [place].   As far as functional and nutritional status, there has been decline over the past few months.[name] and [name] describe him as very social--he enjoyed visiting friends [facility] the [facility]  [place] and [facility]. They share with me that these social interactions were stopped due to Covid-19 restrictions. They share that he has been "sleeping all the time" and not eating well for the past year. He was recently diagnosed with depression and started on zoloft.     We discussed his current illness and what it means in the larger context of his ongoing co-morbidities. Discussed there was no definite diagnosis for the abdominal pain, and medical team was continuing to work this up.   I provided education about failure to thrive in older adults as a potential cause for his declining condition. Discussed my concern that he may not return to his former level of functioning. Daughters share with me they have seen him recover several times in the past and they don't think this time will be any different. They are open to SNF/rehab to help improve his physical functioning in order to get him strong enough to return home. Their goal is for him to be able to do those social activities he formerly enjoyed.   Advanced directives, concepts specific to code status, artifical feeding and hydration, and rehospitalization were considered and discussed.  Scope of treatment:   Cardiopulmonary Resuscitation: Do Not Attempt Resuscitation (DNR/No CPR)  Medical Interventions: Limited Additional Interventions: Use medical treatment, IV fluids and cardiac monitoring as indicated. DO NOT USE intubation or mechanical ventilation. May consider use of less invasive airway support such as BiPAP or CPAP. Also provide comfort measures. Transfer to the hospital if indicated. Avoid intensive care.   Antibiotics: - Antibiotic if indicated  IV Fluids: - IV fluids if indicated  Feeding Tube: - Feeding tube for a defined trial period     Questions and concerns were addressed.  The family was encouraged to call with questions or concerns.   Primary decision maker: patient with support from daughters     SUMMARY OF  RECOMMENDATIONS   - DNR/DNI - continue work-up for abdominal pain - agree with aggressive bowel regimen for constipation - daughters are hopeful for recovery and return to some level of former functioning - recommend outpatient palliative to follow upon discharge - No additional PMT needs at this time, please call [phone] if we can be of additional assistance or need to actively re-engage with this patient and family   Code Status/Advance Care Planning:  DNR  Palliative Prophylaxis:   Bowel Regimen and Frequent Pain Assessment  Additional Recommendations (Limitations, Scope, Preferences):  No Artificial Feeding  Prognosis:   Unable to determine  Discharge Planning: To Be Determined      Primary Diagnoses: Present on Admission: . RLQ abdominal pain . Gallbladder polyp . CKD (chronic kidney disease) stage 3, GFR 30-59 ml/min . COPD (chronic obstructive pulmonary disease) ([place]) . MGUS (monoclonal gammopathy of unknown significance) . Hyperlipidemia . Paroxysmal atrial flutter ([place]) . Failure to thrive in adult   I have reviewed the medical record, interviewed the patient and family, and examined the patient. The following aspects are pertinent.  Past Medical History:  Diagnosis Date  . ACS (acute coronary syndrome) ([place])   . Acute respiratory failure with hypoxia ([place]) 05/2017  . Anemia of renal disease 12/05/2011  . Anemia, iron deficiency 12/05/2011  . Asthma   . CAS (cerebral atherosclerosis)    Carotid Dopplers, 04/09/2012 - Bilateral Bulb/Proximal ICAs-mild to moderate amount of fibrous plaque of fibrous soft plaque w/o evidence of a significant diameter reduction, dissection, or any other vascular abnormality  . COPD (chronic obstructive pulmonary disease) ([place])   . Coronary heart disease    a. s/p CABG in 2006 with LIMA-LAD, SVG-Cx, SVG-IM-OM b. low-risk NST in 08/2016  . Dysrhythmia 11/09/2013   SVT VS A FLUTTER   . Kidney stones   . MGUS (monoclonal  gammopathy of unknown significance) 12/05/2011  . Orthostatic hypotension 09/10/2016  . PAF (paroxysmal atrial fibrillation) ([facility])    a. on Eliquis  . Shortness of breath   . SSS (sick sinus syndrome) ([facility])    a. s/p PPM placement in 2015    Scheduled Meds: . acidophilus  1 capsule Oral QHS  . apixaban  2.5 mg Oral BID  . atorvastatin  40 mg Oral Daily  . brimonidine  1 drop Both Eyes TID  . brinzolamide  1 drop Both Eyes TID  . cycloSPORINE  1 drop Both Eyes BID  . digoxin  0.0625 mg Oral Daily  . furosemide  60 mg Oral Daily  . metoprolol tartrate  75 mg Oral BID  . midodrine  2.5 mg Oral BID WC  . niacin  500 mg Oral QHS  . revefenacin  175 mcg Nebulization Daily  . senna-docusate  1 tablet Oral BID  . tamsulosin  0.4 mg Oral QHS   PRN Meds:.acetaminophen **OR**  acetaminophen, albuterol, ondansetron **OR** ondansetron (ZOFRAN) IV, polyethylene glycol  No Known Allergies  Physical Exam Vitals reviewed.  Constitutional:      General: He is sleeping. He is not in acute distress.    Comments: Chronically ill-appearing  Pulmonary:     Effort: Pulmonary effort is normal.     Vital Signs: BP 118/82 (BP Location: Left Arm)   Pulse 73   Temp 98.3 F (36.8 C) (Oral)   Resp 17   Ht 5\' 9"  (1.753 m)   Wt 73.5 kg   SpO2 93%   BMI 23.92 kg/m  Pain Scale: 0-10   Pain Score: Asleep  LBM: Last BM Date: 07/05/20 Baseline Weight: Weight: 73.5 kg Most recent weight: Weight: 73.5 kg      Palliative Assessment/Data: 40%    Time In: 12:00 Time Out: 12:50 Time Total: 50 minutes Greater than 50%  of this time was spent counseling and coordinating care related to the above assessment and plan.  Signed by: Lavena Bullion, NP   Please contact Palliative Medicine Team phone at [phone] for questions and concerns.  For individual provider: See Shea Evans

## 2020-07-12 NOTE — TOC Initial Note (Signed)
Transition of Care [facility]) - Initial/Assessment Note    Patient Details  Name: [name] MRN: [id number] Date of Birth: 05-16-1929  Transition of Care [facility] CM/SW Contact:    Marilu Favre, RN Phone Number: 07/12/2020, 11:16 AM  Clinical Narrative:                  Spoke to patient at bedside. PAtient requested NCM to call his daughter [name] [phone] . NCM called left message.   Patient's daughter [name] called back and stated [name] unable to make phone call. Explained Medicare observation . Patient currently under observation status, for medicare to cover SNF he would need to meet inpatient criteria three midnights. [name] voices understanding.   Patient lives with [name] who is primary care giver. Patient has shower  chair, walkers and a cane at home. Patient has had home health in past but [name] has no preference.   Number for home health to call would be [facility] [phone].  Spoke with Hoyle Sauer with [facility] and she has accepted referral will need home health orders and face to face.  Expected Discharge Plan: [place]     Patient Goals and CMS Choice Patient states their goals for this hospitalization and ongoing recovery are:: advise to speak to daughters CMS Medicare.gov Compare Post Acute Care list provided to:: Patient Choice offered to / list presented to : Adult Children  Expected Discharge Plan and Services Expected Discharge Plan: [place]   Discharge Planning Services: CM Consult Post Acute Care Choice: [place] arrangements for the past 2 months: Single Family Home                 DME Arranged: N/A DME Agency: NA       HH Arranged: PT, OT HH Agency: Other - See comment ([place]) Date [place]: 07/12/20 Time [place]: 1115 Representative spoke with at [place]: [place] Arrangements/Services Living arrangements for the past 2 months: [place] with:: Adult Children Patient language and need for interpreter reviewed:: Yes Do you feel safe going back to the place where you live?: Yes      Need for Family Participation in Patient Care: Yes (Comment) Care giver support system in place?: Yes (comment) Current home services: DME Criminal Activity/Legal Involvement Pertinent to Current Situation/Hospitalization: No - Comment as needed  Activities of Daily Living      Permission Sought/Granted   Permission granted to share information with : Yes, Verbal Permission Granted  Share Information with NAME: daughters [name] and [name]           Emotional Assessment Appearance:: Appears stated age Attitude/Demeanor/Rapport: Engaged Affect (typically observed): Accepting Orientation: : Oriented to Self, Oriented to Place, Oriented to  Time, Oriented to Situation Alcohol / Substance Use: Not Applicable Psych Involvement: No (comment)  Admission diagnosis:  Failure to thrive in adult [R62.7] Generalized weakness [R53.1] Right upper quadrant abdominal pain [R10.11] Elevated lipase [R74.8] Patient Active Problem List   Diagnosis Date Noted  . RLQ abdominal pain 07/11/2020  . Gallbladder polyp 07/11/2020  . Failure to thrive in adult 07/11/2020  . Exudative age-related macular degeneration of right eye with active choroidal neovascularization ([place]) 05/18/2020  . Retinal hemorrhage, right 05/18/2020  . 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 Acute on chronic right-sided heart failure ([place])   . Pulmonary  hypertension ([place])   . Anemia of chronic disease 06/15/2017  . CKD (chronic kidney disease) stage 3, GFR 30-59 ml/min 06/15/2017  . Acute coronary syndromes ([place]) 06/15/2017  . Calculus of gallbladder without cholecystitis without obstruction 02/13/2017  . CAD (coronary artery disease) 01/23/2017  . Sepsis secondary to UTI ([place])   . Bacteremia  due to Enterobacter species   . Monoclonal gammopathy of unknown significance (MGUS)   . Acute renal failure with acute tubular necrosis superimposed on chronic kidney disease ([place])   . Other hyperlipidemia   . UTI (urinary tract infection) 12/01/2016  . Lactic acidosis 12/01/2016  . Acute respiratory failure with hypoxia ([place]) 12/01/2016  . Acute-on-chronic kidney injury ([place]) 12/01/2016  . Sepsis ([place]) 12/01/2016  . Orthostatic hypotension 09/10/2016  . Chronic anticoagulation 04/23/2016  . Hyperlipidemia 05/09/2014  . AVB (atrioventricular block)- high grade second degree 02/05/2014  . Pacemaker- MDT 02/04/14 02/04/2014  . 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 Nonspecific ST-T wave electrocardiographic changes   . COPD (chronic obstructive pulmonary disease) ([place])   . Paroxysmal atrial flutter ([place]) 08/15/2013  . Syncope 06/24/2013  . MGUS (monoclonal gammopathy of unknown significance) 12/05/2011  . Anemia of renal disease 12/05/2011  . Anemia, iron deficiency 12/05/2011  . ALLERGIC RHINITIS 04/06/2008  . EMPHYSEMA 03/11/2008   PCP:  Cyndi Bender, PA-C Pharmacy:   [id number] - [place], [place] [address] [place] [address] Phone: [phone] Fax: [phone]  [place], [address], [address] [place], [address] [place] [address] Phone: [phone] Fax: [phone]     Social Determinants of Health (SDOH) Interventions    Readmission Risk Interventions No flowsheet data found.

## 2020-07-12 NOTE — Progress Notes (Signed)
PROGRESS NOTE    [name]  HUD:[id number] DOB: 28-Apr-1929 DOA: 07/10/2020 PCP: Cyndi Bender, PA-C    Brief Narrative:  86 year old gentleman with history of pacemaker placement, chronic atrial fibrillation anticoagulated with Eliquis, MGUS, CAD and COPD presents with about 6 months of on and off abdominal pain and recently getting worse.  His pain is mostly in the right upper quadrant and occasionally on the left lateral quadrants.  He had episodes of pain at home where he has severe spasm on his abdomin and feels like he cannot breathe.  He is also usually constipated.  No fever.  Seen by multiple providers outpatient.  Patient also noted to be progressively weak and difficulty ambulation.  Patient also noted to be not eating well.  Sleeping excessively. Recent outpatient ultrasound showed cholelithiasis, ultrasound last night in the ER shows a small gallbladder polyp with no acute cholecystitis.  Mild elevated transaminases normalized today.  CT scan abdomen pelvis with no acute findings.  Not on any acid suppression.  Assessment & Plan:   Principal Problem:   Failure to thrive in adult Active Problems:   MGUS (monoclonal gammopathy of unknown significance)   Paroxysmal atrial flutter (HCC)   COPD (chronic obstructive pulmonary disease) (HCC)   Hyperlipidemia   CKD (chronic kidney disease) stage 3, GFR 30-59 ml/min   RLQ abdominal pain   Gallbladder polyp  Abdominal pain: Episodic severe abdominal pain with no radiological evidence of gallbladder stone today.  Patient has multiple episodes of these abdominal pains with no definite diagnosis.  With significant symptoms, will monitor him and do inpatient investigations. -CT scan abdomen pelvis with contrast with no acute findings.  Right upper quadrant ultrasound with some evidence of chronic liver disease.  Liver function test essentially normal except some transaminase elevation.  Nonobstructive kidney stones. -HIDA scan to  look for gallbladder dysfunction, discussed with surgery.  Who recommended to call them if HIDA scan is positive. -Patient without any history of upper GI endoscopy, may benefit with upper GI endoscopy.  Called and consult placed to gastroenterology to evaluate.  He is on Eliquis, last dose taken today morning. -Currently symptomatic treatment.  Will start patient on Pepcid. -start on aggressive bowel regimen, last Bm 3 days ago.  -We will discuss with his urologist regarding his nonobstructive calculus on family request.  Chronic atrial fibrillation: Well rate controlled on Lopressor and digoxin.  On Eliquis for anticoagulation.  Will hold today in case he needs any anticoagulation.  Does have outpatient cardiology follow-up.  COPD without exacerbation: Stable on albuterol and revefenacin.  Continue.  Stage IIIa chronic kidney disease: creatinine up today, dehydrated, will treat with low dose iv fluids. Hold lasix.  Poor appetite, failure to thrive, MGUS: progressive decline in health. Will investigate for any reversible causes. Consult PT/OT. May need rehab/ home therapies.  DVT prophylaxis:  eliquis , hold today   Code Status: DNR Family Communication: daughter [name] on the phone  Disposition Plan: Status is: Observation  The patient will require care spanning > 2 midnights and should be moved to inpatient because: Ongoing diagnostic testing needed not appropriate for outpatient work up, Unsafe d/c plan and IV treatments appropriate due to intensity of illness or inability to take PO  Dispo: The patient is from: Home              Anticipated d/c is to: Home              Anticipated d/c date is: 2 days  Patient currently is not medically stable to d/c.    Consultants:   Surgery , curbside  GI,  Procedures:   None   Antimicrobials:   None     Subjective: Patient seen and examined.  No overnight events.  He complains of pain all over more on the right upper  quadrant and left lateral quadrant.  No bowel movement for last 3 days.  Objective: Vitals:   07/11/20 2207 07/11/20 2355 07/12/20 0506 07/12/20 0745  BP: 126/72 (!) 113/55 118/82   Pulse:  73 73   Resp: 17 18 17    Temp: 98.2 F (36.8 C) 98.4 F (36.9 C) 98.3 F (36.8 C)   TempSrc: Oral Oral Oral   SpO2: 94% 98% 93% 93%  Weight:      Height:        Intake/Output Summary (Last 24 hours) at 07/12/2020 1400 Last data filed at 07/12/2020 0540 Gross per 24 hour  Intake 380 ml  Output 800 ml  Net -420 ml   Filed Weights   07/10/20 2112  Weight: 73.5 kg    Examination:  Physical Exam Constitutional:      Comments: Chronically sick looking gentleman, not in distress.  Hard of hearing.  HENT:     Head: Atraumatic.  Cardiovascular:     Rate and Rhythm: Regular rhythm.     Comments: Pacemaker in place Pulmonary:     Breath sounds: Normal breath sounds.  Abdominal:     General: Abdomen is flat. There is no distension.     Comments: Patient has mild tenderness along the left lateral quadrant with no rigidity or guarding.  Right upper quadrant and right lateral quadrant with a moderate pain, diffuse, no localized rigidity or guarding.  Bowel sounds present.  Murphy sign negative.  Neurological:     General: No focal deficit present.     Mental Status: He is oriented to person, place, and time.       Data Reviewed: I have personally reviewed following labs and imaging studies  CBC: Recent Labs  Lab 07/10/20 1649 07/12/20 0244  WBC 4.7 5.7  HGB 10.7* 10.5*  HCT 32.7* 33.4*  MCV 101.2* 100.6*  PLT 160 616*   Basic Metabolic Panel: Recent Labs  Lab 07/10/20 1649 07/12/20 0244  NA 139 136  K 4.4 4.4  CL 105 101  CO2 24 23  GLUCOSE 105* 104*  BUN 21 27*  CREATININE 1.28* 1.47*  CALCIUM 9.3 9.6   GFR: Estimated Creatinine Clearance: 34.7 mL/min (A) (by C-G formula based on SCr of 1.47 mg/dL (H)). Liver Function Tests: Recent Labs  Lab 07/10/20 1649  AST  56*  ALT 60*  ALKPHOS 152*  BILITOT 0.8  PROT 6.4*  ALBUMIN 3.4*   Recent Labs  Lab 07/10/20 1649  LIPASE 70*   No results for input(s): AMMONIA in the last 168 hours. Coagulation Profile: No results for input(s): INR, PROTIME in the last 168 hours. Cardiac Enzymes: No results for input(s): CKTOTAL, CKMB, CKMBINDEX, TROPONINI in the last 168 hours. BNP (last 3 results) No results for input(s): PROBNP in the last 8760 hours. HbA1C: No results for input(s): HGBA1C in the last 72 hours. CBG: No results for input(s): GLUCAP in the last 168 hours. Lipid Profile: No results for input(s): CHOL, HDL, LDLCALC, TRIG, CHOLHDL, LDLDIRECT in the last 72 hours. Thyroid Function Tests: No results for input(s): TSH, T4TOTAL, FREET4, T3FREE, THYROIDAB in the last 72 hours. Anemia Panel: No results for input(s): VITAMINB12, FOLATE, FERRITIN,  TIBC, IRON, RETICCTPCT in the last 72 hours. Sepsis Labs: No results for input(s): PROCALCITON, LATICACIDVEN in the last 168 hours.  Recent Results (from the past 240 hour(s))  SARS CORONAVIRUS 2 (TAT 6-24 HRS) Nasopharyngeal Nasopharyngeal Swab     Status: None   Collection Time: 07/11/20  8:35 AM   Specimen: Nasopharyngeal Swab  Result Value Ref Range Status   SARS Coronavirus 2 NEGATIVE NEGATIVE Final    Comment: (NOTE) SARS-CoV-2 target nucleic acids are NOT DETECTED.  The SARS-CoV-2 RNA is generally detectable in upper and lower respiratory specimens during the acute phase of infection. Negative results do not preclude SARS-CoV-2 infection, do not rule out co-infections with other pathogens, and should not be used as the sole basis for treatment or other patient management decisions. Negative results must be combined with clinical observations, patient history, and epidemiological information. The expected result is Negative.  Fact Sheet for Patients: SugarRoll.be  Fact Sheet for Healthcare  Providers: https://www.woods-mathews.com/  This test is not yet approved or cleared by the [place] FDA and  has been authorized for detection and/or diagnosis of SARS-CoV-2 by FDA under an Emergency Use Authorization (EUA). This EUA will remain  in effect (meaning this test can be used) for the duration of the COVID-19 declaration under Se ction 564(b)(1) of the Act, 21 U.S.C. section 360bbb-3(b)(1), unless the authorization is terminated or revoked sooner.  Performed at [facility], [place] [address], [place], [place] [address]          Radiology Studies: DG Chest 1 View  Result Date: 07/11/2020 CLINICAL DATA:  Right upper quadrant pain EXAM: CHEST  1 VIEW COMPARISON:  06/15/2017 FINDINGS: Hyperinflation. There is no edema, consolidation, effusion, or pneumothorax. Normal heart size and mediastinal contours. There has been CABG and dual-chamber pacer implant. IMPRESSION: 1. No evidence of acute disease. 2. Hyperinflation. Electronically Signed   By: Monte Fantasia M.D.   On: 07/11/2020 04:52   CT Abdomen Pelvis W Contrast  Result Date: 07/11/2020 CLINICAL DATA:  Upper abdominal pain. EXAM: CT ABDOMEN AND PELVIS WITH CONTRAST TECHNIQUE: Multidetector CT imaging of the abdomen and pelvis was performed using the standard protocol following bolus administration of intravenous contrast. CONTRAST:  151mL OMNIPAQUE IOHEXOL 350 MG/ML SOLN COMPARISON:  CT scan 02/20/2018 FINDINGS: Lower chest: Left basilar scarring and some atelectasis due to mild eventration left hemidiaphragm. No worrisome pulmonary lesions or pleural effusion. Underlying emphysematous changes are stable. The heart is normal in size. Stable pacer wires. Stable aortic calcifications. Hepatobiliary: Stable low-attenuation lesion in the left hepatic lobe consistent with a benign cyst. No worrisome hepatic lesions or intrahepatic biliary dilatation. Gallbladder is unremarkable. No common bile duct dilatation.  Pancreas: No mass, inflammation or ductal dilatation. Spleen: Normal size.  Small splenic cyst. Adrenals/Urinary Tract: The adrenal glands are unremarkable. Bilateral renal calculi are noted. No obstructing ureteral calculi are identified. There are bilateral renal scarring changes and small renal cysts. No worrisome renal lesions or hydronephrosis. There is a 4 mm calculus in the bladder on the left side. No bladder lesions. Right-sided bladder diverticulum is stable. Stomach/Bowel: The stomach, duodenum, small bowel and colon are grossly normal without oral contrast. No acute inflammatory changes, mass lesions or obstructive findings. The terminal ileum is normal. The appendix is not identified for certain but no findings to suggest acute appendicitis. Sigmoid colon diverticulosis but no findings for acute diverticulitis. Vascular/Lymphatic: Advanced atherosclerotic calcifications involving the aorta and branch vessels but no aneurysm or dissection. No mesenteric or retroperitoneal  mass or adenopathy. Reproductive: Mild prostate gland enlargement. Possible prior TURP defect. The seminal vesicles are unremarkable. Other: No pelvic mass or adenopathy. No free pelvic fluid collections. No inguinal mass or adenopathy. No abdominal wall hernia or subcutaneous lesions. Musculoskeletal: No significant bony findings. Stable T12 and L1 compression deformities. IMPRESSION: 1. No acute abdominal/pelvic findings, mass lesions or adenopathy. 2. Bilateral renal calculi but no obstructing ureteral calculi. 3. 4 mm calculus in the left side of the bladder. 4. Stable right-sided bladder diverticulum. 5. Advanced atherosclerotic calcifications involving the aorta and branch vessels. 6. Stable T12 and L1 compression deformities. Aortic Atherosclerosis (ICD10-I70.0). Electronically Signed   By: Marijo Sanes M.D.   On: 07/11/2020 08:12   US Abdomen Limited  Result Date: 07/11/2020 CLINICAL DATA:  Right upper quadrant abdominal  pain. EXAM: ULTRASOUND ABDOMEN LIMITED RIGHT UPPER QUADRANT COMPARISON:  Abdominal ultrasound 07/07/2020 FINDINGS: Gallbladder: A few small nonshadowing echogenic structures may be gallbladder polyps. I do not see any definite mobile mobile stones. No gallbladder wall thickening, pericholecystic fluid or sonographic Murphy sign to suggest acute cholecystitis. Common bile duct: Diameter: 4.7 mm Liver: Normal echogenicity. 2.0 x 1.0 cm left hepatic lobe cyst is noted and unchanged since prior CT scans. Portal vein is patent on color Doppler imaging with normal direction of blood flow towards the liver. Other: None. IMPRESSION: 1. Probable small gallbladder polyps. No definite mobile shadowing gallstones. 2. No findings for acute cholecystitis or biliary dilatation. 3. Normal liver except for a stable small simple cyst in the left hepatic lobe. Electronically Signed   By: Marijo Sanes M.D.   On: 07/11/2020 05:27        Scheduled Meds:  acidophilus  1 capsule Oral QHS   atorvastatin  40 mg Oral Daily   brimonidine  1 drop Both Eyes TID   brinzolamide  1 drop Both Eyes TID   cycloSPORINE  1 drop Both Eyes BID   digoxin  0.0625 mg Oral Daily   famotidine  20 mg Oral BID   metoprolol tartrate  75 mg Oral BID   midodrine  2.5 mg Oral BID WC   niacin  500 mg Oral QHS   revefenacin  175 mcg Nebulization Daily   senna-docusate  1 tablet Oral BID   tamsulosin  0.4 mg Oral QHS   Continuous Infusions:  sodium chloride       LOS: 0 days    Time spent: 30 minutes    Barb Merino, MD [facility] Pager [phone]

## 2020-07-13 DIAGNOSIS — Z7189 Other specified counseling: Secondary | ICD-10-CM

## 2020-07-13 DIAGNOSIS — R1011 Right upper quadrant pain: Secondary | ICD-10-CM

## 2020-07-13 DIAGNOSIS — Z515 Encounter for palliative care: Secondary | ICD-10-CM

## 2020-07-13 DIAGNOSIS — R1012 Left upper quadrant pain: Secondary | ICD-10-CM

## 2020-07-13 LAB — CBC WITH DIFFERENTIAL/PLATELET
Abs Immature Granulocytes: 0.04 10*3/uL (ref 0.00–0.07)
Basophils Absolute: 0 10*3/uL (ref 0.0–0.1)
Basophils Relative: 0 %
Eosinophils Absolute: 0 10*3/uL (ref 0.0–0.5)
Eosinophils Relative: 0 %
HCT: 31.5 % — ABNORMAL LOW (ref 39.0–52.0)
Hemoglobin: 10.2 g/dL — ABNORMAL LOW (ref 13.0–17.0)
Immature Granulocytes: 1 %
Lymphocytes Relative: 16 %
Lymphs Abs: 1 10*3/uL (ref 0.7–4.0)
MCH: 32.9 pg (ref 26.0–34.0)
MCHC: 32.4 g/dL (ref 30.0–36.0)
MCV: 101.6 fL — ABNORMAL HIGH (ref 80.0–100.0)
Monocytes Absolute: 0.9 10*3/uL (ref 0.1–1.0)
Monocytes Relative: 14 %
Neutro Abs: 4.1 10*3/uL (ref 1.7–7.7)
Neutrophils Relative %: 69 %
Platelets: 151 10*3/uL (ref 150–400)
RBC: 3.1 MIL/uL — ABNORMAL LOW (ref 4.22–5.81)
RDW: 14.1 % (ref 11.5–15.5)
WBC: 6 10*3/uL (ref 4.0–10.5)
nRBC: 0 % (ref 0.0–0.2)

## 2020-07-13 LAB — COMPREHENSIVE METABOLIC PANEL
ALT: 49 U/L — ABNORMAL HIGH (ref 0–44)
AST: 39 U/L (ref 15–41)
Albumin: 3.4 g/dL — ABNORMAL LOW (ref 3.5–5.0)
Alkaline Phosphatase: 149 U/L — ABNORMAL HIGH (ref 38–126)
Anion gap: 12 (ref 5–15)
BUN: 39 mg/dL — ABNORMAL HIGH (ref 8–23)
CO2: 24 mmol/L (ref 22–32)
Calcium: 9.3 mg/dL (ref 8.9–10.3)
Chloride: 100 mmol/L (ref 98–111)
Creatinine, Ser: 1.82 mg/dL — ABNORMAL HIGH (ref 0.61–1.24)
GFR calc Af Amer: 38 mL/min — ABNORMAL LOW (ref >60)
GFR calc non Af Amer: 32 mL/min — ABNORMAL LOW (ref >60)
Glucose, Bld: 102 mg/dL — ABNORMAL HIGH (ref 70–99)
Potassium: 4.3 mmol/L (ref 3.5–5.1)
Sodium: 136 mmol/L (ref 135–145)
Total Bilirubin: 1.6 mg/dL — ABNORMAL HIGH (ref 0.3–1.2)
Total Protein: 6.6 g/dL (ref 6.5–8.1)

## 2020-07-13 LAB — MAGNESIUM: Magnesium: 2.1 mg/dL (ref 1.7–2.4)

## 2020-07-13 LAB — PHOSPHORUS: Phosphorus: 4.8 mg/dL — ABNORMAL HIGH (ref 2.5–4.6)

## 2020-07-13 LAB — LIPASE, BLOOD: Lipase: 46 U/L (ref 11–51)

## 2020-07-13 MED ORDER — POLYETHYLENE GLYCOL 3350 17 G PO PACK
17.0000 g | PACK | Freq: Every day | ORAL | Status: DC
  Administered 2020-07-13: 17 g via ORAL
  Filled 2020-07-13: qty 1

## 2020-07-13 MED ORDER — BISACODYL 10 MG RE SUPP
10.0000 mg | Freq: Once | RECTAL | Status: AC
  Administered 2020-07-13: 10 mg via RECTAL
  Filled 2020-07-13: qty 1

## 2020-07-13 MED ORDER — DICYCLOMINE HCL 10 MG PO CAPS
10.0000 mg | ORAL_CAPSULE | Freq: Three times a day (TID) | ORAL | Status: DC
  Administered 2020-07-13: 10 mg via ORAL
  Filled 2020-07-13: qty 1

## 2020-07-13 NOTE — Progress Notes (Signed)
Per daughter's patient is seen by [facility] for Dx of Keratosis Obturaris of the Left ear.   Alla Feeling RN 07/13/20 @ 1308

## 2020-07-13 NOTE — Progress Notes (Signed)
PROGRESS NOTE    [name]  DDU:[id number] DOB: 1929/02/06 DOA: 07/10/2020 PCP: Cyndi Bender, PA-C    Brief Narrative:  86 year old gentleman with history of pacemaker placement, chronic atrial fibrillation anticoagulated with Eliquis, MGUS, CAD and COPD presents with about 6 months of on and off abdominal pain and recently getting worse.  His pain is mostly in the right upper quadrant and occasionally on the left lateral quadrants.  He had episodes of pain at home where he has severe spasm on his abdomin and feels like he cannot breathe.  He is also usually constipated.  No fever.  Seen by multiple providers outpatient.  Patient also noted to be progressively weak and difficulty ambulation.  Patient also noted to be not eating well.  Sleeping excessively.  Recent outpatient ultrasound showed cholelithiasis, ultrasound last night in the ER shows a small gallbladder polyp with no acute cholecystitis.  Mild elevated transaminases normalized today.  CT scan abdomen pelvis with no acute findings.  Not on any acid suppression.  Assessment & Plan:   Principal Problem:   Failure to thrive in adult Active Problems:   MGUS (monoclonal gammopathy of unknown significance)   Paroxysmal atrial flutter (HCC)   COPD (chronic obstructive pulmonary disease) (HCC)   Hyperlipidemia   CKD (chronic kidney disease) stage 3, GFR 30-59 ml/min   RLQ abdominal pain   Gallbladder polyp   Abdominal pain   Goals of care, counseling/discussion   Palliative care by specialist  Abdominal pain: Episodic severe abdominal pain with no radiological evidence of gallbladder stone.  Patient has multiple episodes of these abdominal pains with no definite diagnosis.  With significant symptoms, will monitor him and do inpatient investigations. -CT scan abdomen pelvis with contrast with no acute findings.  Right upper quadrant ultrasound with some evidence of chronic liver disease.  Liver function test essentially  normal except some transaminase elevation.  Nonobstructive kidney stones. -HIDA scan with very mild decrease in ejection fraction.  Have discussed case with surgery, they recommended no intervention. -CT scan with evidence of nonobstructive kidney stones, 4 mm stone in the bladder, patient probably had passed ureteric stone into the bladder.  Discussed case with urology, they will follow up in 1 week after discharge. -Patient without any history of upper GI endoscopy, may benefit with upper GI endoscopy.  To be seen by GI.  Holding Eliquis.  -Currently symptomatic treatment.  Started on Pepcid. -start on aggressive bowel regimen,   Chronic atrial fibrillation: Well rate controlled on Lopressor and digoxin.  On Eliquis for anticoagulation.  Will hold today in case he needs any procedure.  Does have outpatient cardiology follow-up.  COPD without exacerbation: Stable on albuterol and revefenacin.  Continue.  Stage IIIa chronic kidney disease: creatinine up today, dehydrated, will treat with low dose iv fluids. Hold lasix.  Poor appetite, failure to thrive, MGUS: progressive decline in health. Will investigate for any reversible causes. Consult PT/OT. May need rehab/ home therapies.  Hearing loss: Bilateral. Left more than right.  Patient has keratosis obliterans of the left ear.  Gets removal under microscope every 2 weeks at [facility]. On family request, a call was placed to on-call physician to discuss about possibility of doing procedure in the hospital.  Unable to do outpatient procedure in the hospital. They will follow up after discharge as soon as possible.  DVT prophylaxis:  eliquis , on hold.   Code Status: DNR Family Communication: daughter [name] on the phone  Disposition Plan: Status is: Inpatient  The patient will require care spanning > 2 midnights and should be moved to inpatient because: Ongoing diagnostic testing needed not appropriate for outpatient work up, Unsafe d/c  plan and IV treatments appropriate due to intensity of illness or inability to take PO  Dispo: The patient is from: Home              Anticipated d/c is to: Skilled nursing facility              Anticipated d/c date is: 2 days              Patient currently is not medically stable to d/c.  Work-up underway.    Consultants:   Surgery , curbside  GI,  Urology, curbside  ENT, phone consultation  Procedures:   None   Antimicrobials:   None     Subjective: Patient seen and examined.  Today he is further unable to hear.  Denies any complaints at rest.  His belly hurts when he ambulates or tries to move.  Denies any nausea vomiting.  Objective: Vitals:   07/12/20 0745 07/12/20 2044 07/13/20 0807 07/13/20 1422  BP:  135/65  115/65  Pulse:  82  73  Resp:  17  17  Temp:  98.2 F (36.8 C)  98.5 F (36.9 C)  TempSrc:  Oral  Oral  SpO2: 93% 95% 96% 95%  Weight:      Height:        Intake/Output Summary (Last 24 hours) at 07/13/2020 1437 Last data filed at 07/13/2020 1423 Gross per 24 hour  Intake 1037.5 ml  Output 325 ml  Net 712.5 ml   Filed Weights   07/10/20 2112  Weight: 73.5 kg    Examination:  Physical Exam Constitutional:      Comments: Chronically sick looking gentleman, not in distress.  Hard of hearing.  HENT:     Head: Atraumatic.  Cardiovascular:     Rate and Rhythm: Regular rhythm.     Comments: Pacemaker in place Pulmonary:     Breath sounds: Normal breath sounds.  Abdominal:     General: Abdomen is flat. There is no distension.     Comments: Moderate pain on palpation right lateral quadrant and lower quadrant with no rigidity or guarding.  Neurological:     General: No focal deficit present.     Mental Status: He is oriented to person, place, and time.       Data Reviewed: I have personally reviewed following labs and imaging studies  CBC: Recent Labs  Lab 07/10/20 1649 07/12/20 0244 07/13/20 0330  WBC 4.7 5.7 6.0  NEUTROABS   --   --  4.1  HGB 10.7* 10.5* 10.2*  HCT 32.7* 33.4* 31.5*  MCV 101.2* 100.6* 101.6*  PLT 160 143* 161   Basic Metabolic Panel: Recent Labs  Lab 07/10/20 1649 07/12/20 0244 07/13/20 0330  NA 139 136 136  K 4.4 4.4 4.3  CL 105 101 100  CO2 24 23 24   GLUCOSE 105* 104* 102*  BUN 21 27* 39*  CREATININE 1.28* 1.47* 1.82*  CALCIUM 9.3 9.6 9.3  MG  --   --  2.1  PHOS  --   --  4.8*   GFR: Estimated Creatinine Clearance: 28.1 mL/min (A) (by C-G formula based on SCr of 1.82 mg/dL (H)). Liver Function Tests: Recent Labs  Lab 07/10/20 1649 07/13/20 0330  AST 56* 39  ALT 60* 49*  ALKPHOS 152* 149*  BILITOT 0.8 1.6*  PROT  6.4* 6.6  ALBUMIN 3.4* 3.4*   Recent Labs  Lab 07/10/20 1649 07/13/20 0330  LIPASE 70* 46   No results for input(s): AMMONIA in the last 168 hours. Coagulation Profile: No results for input(s): INR, PROTIME in the last 168 hours. Cardiac Enzymes: No results for input(s): CKTOTAL, CKMB, CKMBINDEX, TROPONINI in the last 168 hours. BNP (last 3 results) No results for input(s): PROBNP in the last 8760 hours. HbA1C: No results for input(s): HGBA1C in the last 72 hours. CBG: No results for input(s): GLUCAP in the last 168 hours. Lipid Profile: No results for input(s): CHOL, HDL, LDLCALC, TRIG, CHOLHDL, LDLDIRECT in the last 72 hours. Thyroid Function Tests: No results for input(s): TSH, T4TOTAL, FREET4, T3FREE, THYROIDAB in the last 72 hours. Anemia Panel: No results for input(s): VITAMINB12, FOLATE, FERRITIN, TIBC, IRON, RETICCTPCT in the last 72 hours. Sepsis Labs: No results for input(s): PROCALCITON, LATICACIDVEN in the last 168 hours.  Recent Results (from the past 240 hour(s))  SARS CORONAVIRUS 2 (TAT 6-24 HRS) Nasopharyngeal Nasopharyngeal Swab     Status: None   Collection Time: 07/11/20  8:35 AM   Specimen: Nasopharyngeal Swab  Result Value Ref Range Status   SARS Coronavirus 2 NEGATIVE NEGATIVE Final    Comment: (NOTE) SARS-CoV-2 target  nucleic acids are NOT DETECTED.  The SARS-CoV-2 RNA is generally detectable in upper and lower respiratory specimens during the acute phase of infection. Negative results do not preclude SARS-CoV-2 infection, do not rule out co-infections with other pathogens, and should not be used as the sole basis for treatment or other patient management decisions. Negative results must be combined with clinical observations, patient history, and epidemiological information. The expected result is Negative.  Fact Sheet for Patients: SugarRoll.be  Fact Sheet for Healthcare Providers: https://www.woods-mathews.com/  This test is not yet approved or cleared by the [place] FDA and  has been authorized for detection and/or diagnosis of SARS-CoV-2 by FDA under an Emergency Use Authorization (EUA). This EUA will remain  in effect (meaning this test can be used) for the duration of the COVID-19 declaration under Se ction 564(b)(1) of the Act, 21 U.S.C. section 360bbb-3(b)(1), unless the authorization is terminated or revoked sooner.  Performed at [facility], [place] [address] [place], [place] [address]          Radiology Studies: NM Hepato W/EF  Result Date: 07/12/2020 CLINICAL DATA:  Upper abdominal pain EXAM: NUCLEAR MEDICINE HEPATOBILIARY IMAGING WITH GALLBLADDER EF TECHNIQUE: Sequential images of the abdomen were obtained out to 60 minutes following intravenous administration of radiopharmaceutical. After oral ingestion of Ensure, gallbladder ejection fraction was determined. At 60 min, normal ejection fraction is greater than 33%. RADIOPHARMACEUTICALS:  4.7 mCi Tc-8m  Choletec IV COMPARISON:  07/11/2020 FINDINGS: Prompt uptake and biliary excretion of activity by the liver is seen. Gallbladder activity is visualized, consistent with patency of cystic duct. Biliary activity passes into small bowel, consistent with patent common bile duct.  Calculated gallbladder ejection fraction is 28%. (Normal gallbladder ejection fraction with Ensure is greater than 33%.) IMPRESSION: 1. No evidence of acute cholecystitis or biliary obstruction. 2. Slightly diminished ejection fraction which can be seen with chronic cholecystitis or biliary dyskinesia. Electronically Signed   By: Randa Ngo M.D.   On: 07/12/2020 17:56        Scheduled Meds: . acidophilus  1 capsule Oral QHS  . atorvastatin  40 mg Oral Daily  . brimonidine  1 drop Both Eyes TID  . brinzolamide  1  drop Both Eyes TID  . cycloSPORINE  1 drop Both Eyes BID  . digoxin  0.0625 mg Oral Daily  . famotidine  20 mg Oral BID  . feeding supplement (ENSURE ENLIVE)  237 mL Oral TID BM  . metoprolol tartrate  75 mg Oral BID  . midodrine  2.5 mg Oral BID WC  . multivitamin with minerals  1 tablet Oral Daily  . niacin  500 mg Oral QHS  . revefenacin  175 mcg Nebulization Daily  . senna-docusate  1 tablet Oral BID  . sertraline  25 mg Oral Daily  . tamsulosin  0.4 mg Oral QHS   Continuous Infusions: . sodium chloride 75 mL/hr at 07/13/20 1426     LOS: 1 day    Time spent: 30 minutes    Barb Merino, MD [facility] Pager [phone]

## 2020-07-13 NOTE — TOC Progression Note (Addendum)
Transition of Care [facility]) - Progression Note    Patient Details  Name: [name] MRN: [id number] Date of Birth: [birth year]  Transition of Care [facility]) CM/SW Contact  Jacalyn Lefevre Edson Snowball, RN Phone Number: 07/13/2020, 11:10 AM  Clinical Narrative:    [phone] Angela at [facility], unable to offer patient a SNF bed. Called Joann [phone] and left message. Await call back. WIll leave medicare.gov list of bed offers at bedside.  Patient now inpatient. Called daughter [name] [phone], [name] answered. Discussed inpatient status and SNF. [name] does want SNF at discharge. She is in agreement to fax FL2 to surrending area. Patient has been at [facility] and that would be their first choice. Faxed and called Angela at [facility].  Will need DNR signed  Expected Discharge Plan: [place] Barriers to Discharge: Continued Medical Work up  Expected Discharge Plan and Services Expected Discharge Plan: [place]   Discharge Planning Services: CM Consult Post Acute Care Choice: [place] arrangements for the past 2 months: Single Family Home                 DME Arranged: N/A DME Agency: NA       HH Arranged: PT, OT HH Agency: Other - See comment ([place]) Date [place]: 07/12/20 Time [place]: 1115 Representative spoke with at [place]: [place] ([place]) Interventions    Readmission Risk Interventions No flowsheet data found.

## 2020-07-13 NOTE — Progress Notes (Signed)
Physical Therapy Treatment Patient Details Name: [name] MRN: [id number] DOB: November 28, 1929 Today's Date: 07/13/2020    History of Present Illness Pt is a 86 y.o. male with medical history significant of pacemaker placement; afib; MGUS; CAD; and COPD presenting with abdominal pain.  He reports having 6 months of RLQ abdominal pain.  The pain is intermittent in nature and sharp with certain movements.  He has persistent nausea that he relates to the pain.  Additionally, pt with progressive weakness.  Pt's principal problem per H and P is failure to thrive and R LQ abdominal pain.    PT Comments    Pt progressing steadily towards his physical therapy goals. Reports feeling "lousy," but did not elaborate further. Session focused on gait training, functional mobility and seated exercises for strengthening. Pt ambulating 100 feet at a min guard assist level. Continues with weakness, balance deficits, and decreased endurance. Continue to recommend SNF for ongoing Physical Therapy.      Follow Up Recommendations  SNF;Supervision/Assistance - 24 hour     Equipment Recommendations  None recommended by PT    Recommendations for Other Services       Precautions / Restrictions Precautions Precautions: Fall Restrictions Weight Bearing Restrictions: No    Mobility  Bed Mobility Overal bed mobility: Modified Independent                Transfers Overall transfer level: Needs assistance Equipment used: Rolling walker (2 wheeled) Transfers: Sit to/from Stand Sit to Stand: Min guard            Ambulation/Gait Ambulation/Gait assistance: Min guard Gait Distance (Feet): 100 Feet Assistive device: Rolling walker (2 wheeled) Gait Pattern/deviations: Decreased stride length;Shuffle;Trunk flexed;Narrow base of support Gait velocity: decreased   General Gait Details: Cues for upward gaze, wider BOS, min guard for safety   Stairs             Wheelchair Mobility     Modified Rankin (Stroke Patients Only)       Balance Overall balance assessment: Needs assistance Sitting-balance support: No upper extremity supported Sitting balance-Leahy Scale: Good     Standing balance support: During functional activity;No upper extremity supported Standing balance-Leahy Scale: Fair                              Cognition Arousal/Alertness: Awake/alert Behavior During Therapy: Flat affect Overall Cognitive Status: Difficult to assess                                 General Comments: Follows all 1 step commands, decreased engagement with therapist. States he feels "lousy," but would not elaborate further      Exercises General Exercises - Lower Extremity Long Arc Quad: Both;10 reps;Seated Hip Flexion/Marching: Both;10 reps;Seated Other Exercises Other Exercises: x5 Sit to stands for functional strength training    General Comments        Pertinent Vitals/Pain Pain Assessment: No/denies pain    Home Living                      Prior Function            PT Goals (current goals can now be found in the care plan section) Acute Rehab PT Goals Patient Stated Goal: get stronger Potential to Achieve Goals: Good Progress towards PT goals: Progressing toward goals    Frequency  Min 3X/week      PT Plan Current plan remains appropriate    Co-evaluation              AM-PAC PT "6 Clicks" Mobility   Outcome Measure  Help needed turning from your back to your side while in a flat bed without using bedrails?: None Help needed moving from lying on your back to sitting on the side of a flat bed without using bedrails?: None Help needed moving to and from a bed to a chair (including a wheelchair)?: A Little Help needed standing up from a chair using your arms (e.g., wheelchair or bedside chair)?: A Little Help needed to walk in hospital room?: A Little Help needed climbing 3-5 steps with a railing? :  A Lot 6 Click Score: 19    End of Session Equipment Utilized During Treatment: Gait belt Activity Tolerance: Patient tolerated treatment well Patient left: in chair;with call bell/phone within reach;with chair alarm set Nurse Communication: Mobility status PT Visit Diagnosis: Unsteadiness on feet (R26.81);Muscle weakness (generalized) (M62.81)     Time: 5732-2025 PT Time Calculation (min) (ACUTE ONLY): 26 min  Charges:  $Gait Training: 8-22 mins $Therapeutic Activity: 8-22 mins                       Wyona Almas, PT, DPT Acute Rehabilitation Services Pager [phone] Office [phone]    Deno Etienne 07/13/2020, 4:35 PM

## 2020-07-13 NOTE — NC FL2 (Signed)
[place] LEVEL OF CARE SCREENING TOOL     IDENTIFICATION  Patient Name: [name] Birthdate: 03-Jan-1929 Sex: male Admission Date (Current Location): 07/10/2020  [facility] and [place] Number:  Herbalist and Address:  [facility], [place] [address] [place], [place] [address]      Provider Number: [id number]  Attending Physician Name and Address:  Barb Merino, MD  Relative Name and Phone Number:  Saulo Anthis [phone]    Current Level of Care: Hospital Recommended Level of Care: [place] Prior Approval Number:    Date Approved/Denied:   PASRR Number: [id number]  Discharge Plan: SNF    Current Diagnoses: Patient Active Problem List   Diagnosis Date Noted  . Goals of care, counseling/discussion   . Palliative care by specialist   . Abdominal pain 07/12/2020  . RLQ abdominal pain 07/11/2020  . Gallbladder polyp 07/11/2020  . Failure to thrive in adult 07/11/2020  . Exudative age-related macular degeneration of right eye with active choroidal neovascularization ([place]) 05/18/2020  . Retinal hemorrhage, right 05/18/2020  . 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 Acute on chronic right-sided heart failure ([place])   . Pulmonary hypertension ([place])   . Anemia of chronic disease 06/15/2017  . CKD (chronic kidney disease) stage 3, GFR 30-59 ml/min 06/15/2017  . Acute coronary syndromes ([place]) 06/15/2017  . Calculus of gallbladder without cholecystitis without obstruction 02/13/2017  . CAD (coronary artery disease) 01/23/2017  . Sepsis secondary to UTI ([place])   . Bacteremia due to Enterobacter species   . Monoclonal gammopathy of unknown significance (MGUS)   . Acute renal failure with acute tubular necrosis superimposed on chronic kidney disease ([place])   . Other hyperlipidemia   . UTI (urinary tract infection) 12/01/2016  . Lactic  acidosis 12/01/2016  . Acute respiratory failure with hypoxia ([place]) 12/01/2016  . Acute-on-chronic kidney injury ([place]) 12/01/2016  . Sepsis ([place]) 12/01/2016  . Orthostatic hypotension 09/10/2016  . Chronic anticoagulation 04/23/2016  . Hyperlipidemia 05/09/2014  . AVB (atrioventricular block)- high grade second degree 02/05/2014  . Pacemaker- MDT 02/04/14 02/04/2014  . 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 Nonspecific ST-T wave electrocardiographic changes   . COPD (chronic obstructive pulmonary disease) ([place])   . Paroxysmal atrial flutter ([place]) 08/15/2013  . Syncope 06/24/2013  . MGUS (monoclonal gammopathy of unknown significance) 12/05/2011  . Anemia of renal disease 12/05/2011  . Anemia, iron deficiency 12/05/2011  . ALLERGIC RHINITIS 04/06/2008  . EMPHYSEMA 03/11/2008    Orientation RESPIRATION BLADDER Height & Weight     Self, Time, Situation, Place  Normal Continent Weight: 73.5 kg Height:  5\' 9"  (175.3 cm)  BEHAVIORAL SYMPTOMS/MOOD NEUROLOGICAL BOWEL NUTRITION STATUS      Continent Diet  AMBULATORY STATUS COMMUNICATION OF NEEDS Skin   Extensive Assist Verbally Normal                       Personal Care Assistance Level of Assistance  Bathing, Dressing, Feeding Bathing Assistance: Limited assistance Feeding assistance: Independent Dressing Assistance: Limited assistance     Functional Limitations Info  Sight, Hearing, Speech Sight Info: Adequate Hearing Info: Impaired (rt ear deaf, lt ear hearing aide) Speech Info: Adequate    SPECIAL CARE FACTORS FREQUENCY  PT (By licensed PT), OT (By licensed OT)     PT Frequency: five times a week OT Frequency: five times a week  Contractures Contractures Info: Not present    Additional Factors Info  Code Status, Allergies Code Status Info: DNR Allergies Info: No Known allergies           Current Medications (07/13/2020):  This is the current hospital active medication list Current Facility-Administered  Medications  Medication Dose Route Frequency Provider Last Rate Last Admin  . 0.9 %  sodium chloride infusion   Intravenous Continuous Barb Merino, MD 75 mL/hr at 07/13/20 0737 Restarted at 07/13/20 0737  . acetaminophen (TYLENOL) tablet 650 mg  650 mg Oral Q6H PRN Karmen Bongo, MD   650 mg at 07/13/20 0744   Or  . acetaminophen (TYLENOL) suppository 650 mg  650 mg Rectal Q6H PRN Karmen Bongo, MD      . acidophilus (RISAQUAD) capsule 1 capsule  1 capsule Oral Ivery Quale, MD   1 capsule at 07/12/20 2209  . albuterol (PROVENTIL) (2.5 MG/3ML) 0.083% nebulizer solution 2.5 mg  2.5 mg Inhalation Q4H PRN Karmen Bongo, MD      . atorvastatin (LIPITOR) tablet 40 mg  40 mg Oral Daily Karmen Bongo, MD   40 mg at 07/13/20 1043  . brimonidine (ALPHAGAN) 0.2 % ophthalmic solution 1 drop  1 drop Both Eyes TID Karmen Bongo, MD   1 drop at 07/13/20 1042  . brinzolamide (AZOPT) 1 % ophthalmic suspension 1 drop  1 drop Both Eyes TID Karmen Bongo, MD   1 drop at 07/13/20 1045  . cycloSPORINE (RESTASIS) 0.05 % ophthalmic emulsion 1 drop  1 drop Both Eyes BID Karmen Bongo, MD   1 drop at 07/13/20 1048  . digoxin (LANOXIN) tablet 0.0625 mg  0.0625 mg Oral Daily Karmen Bongo, MD   0.0625 mg at 07/13/20 1044  . famotidine (PEPCID) tablet 20 mg  20 mg Oral BID Barb Merino, MD   20 mg at 07/13/20 1045  . feeding supplement (ENSURE ENLIVE) (ENSURE ENLIVE) liquid 237 mL  237 mL Oral TID BM Barb Merino, MD   237 mL at 07/13/20 1043  . metoprolol tartrate (LOPRESSOR) tablet 75 mg  75 mg Oral BID Karmen Bongo, MD   75 mg at 07/13/20 1043  . midodrine (PROAMATINE) tablet 2.5 mg  2.5 mg Oral BID WC Karmen Bongo, MD   2.5 mg at 07/13/20 0744  . multivitamin with minerals tablet 1 tablet  1 tablet Oral Daily Barb Merino, MD   1 tablet at 07/13/20 1044  . niacin (NIASPAN) CR tablet 500 mg  500 mg Oral QHS Karmen Bongo, MD   500 mg at 07/12/20 2212  . ondansetron (ZOFRAN) tablet 4  mg  4 mg Oral Q6H PRN Karmen Bongo, MD       Or  . ondansetron [facility]) injection 4 mg  4 mg Intravenous Q6H PRN Karmen Bongo, MD   4 mg at 07/12/20 0507  . polyethylene glycol (MIRALAX / GLYCOLAX) packet 17 g  17 g Oral Daily PRN Barb Merino, MD      . revefenacin (YUPELRI) nebulizer solution 175 mcg  175 mcg Nebulization Daily Karmen Bongo, MD   175 mcg at 07/13/20 0806  . senna-docusate (Senokot-S) tablet 1 tablet  1 tablet Oral BID Barb Merino, MD   1 tablet at 07/13/20 1045  . sertraline (ZOLOFT) tablet 25 mg  25 mg Oral Daily Barb Merino, MD   25 mg at 07/13/20 1044  . tamsulosin (FLOMAX) capsule 0.4 mg  0.4 mg Oral QHS Karmen Bongo, MD   0.4 mg at 07/12/20 2209  Discharge Medications: Please see discharge summary for a list of discharge medications.  Relevant Imaging Results:  Relevant Lab Results:   Additional Information SS: [id number] , pacemaker, a fib on Eliquis, COPD  Melodye Swor, Edson Snowball, RN

## 2020-07-14 ENCOUNTER — Inpatient Hospital Stay (HOSPITAL_COMMUNITY): Payer: Medicare Other | Admitting: Certified Registered Nurse Anesthetist

## 2020-07-14 ENCOUNTER — Encounter (HOSPITAL_COMMUNITY)
Admission: EM | Disposition: A | Discharge status: Skilled Nursing Facility | Payer: Self-pay | Source: Home / Self Care | Attending: Internal Medicine

## 2020-07-14 ENCOUNTER — Encounter (HOSPITAL_COMMUNITY): Payer: Self-pay | Admitting: Internal Medicine

## 2020-07-14 HISTORY — PX: ESOPHAGOGASTRODUODENOSCOPY: SHX1529

## 2020-07-14 HISTORY — PX: ESOPHAGOGASTRODUODENOSCOPY (EGD) WITH PROPOFOL: SHX5813

## 2020-07-14 LAB — COMPREHENSIVE METABOLIC PANEL
ALT: 82 U/L — ABNORMAL HIGH (ref 0–44)
AST: 68 U/L — ABNORMAL HIGH (ref 15–41)
Albumin: 3.1 g/dL — ABNORMAL LOW (ref 3.5–5.0)
Alkaline Phosphatase: 164 U/L — ABNORMAL HIGH (ref 38–126)
Anion gap: 11 (ref 5–15)
BUN: 46 mg/dL — ABNORMAL HIGH (ref 8–23)
CO2: 23 mmol/L (ref 22–32)
Calcium: 9.2 mg/dL (ref 8.9–10.3)
Chloride: 104 mmol/L (ref 98–111)
Creatinine, Ser: 1.64 mg/dL — ABNORMAL HIGH (ref 0.61–1.24)
GFR calc Af Amer: 43 mL/min — ABNORMAL LOW (ref >60)
GFR calc non Af Amer: 37 mL/min — ABNORMAL LOW (ref >60)
Glucose, Bld: 120 mg/dL — ABNORMAL HIGH (ref 70–99)
Potassium: 4.4 mmol/L (ref 3.5–5.1)
Sodium: 138 mmol/L (ref 135–145)
Total Bilirubin: 1 mg/dL (ref 0.3–1.2)
Total Protein: 6.3 g/dL — ABNORMAL LOW (ref 6.5–8.1)

## 2020-07-14 LAB — CBC WITH DIFFERENTIAL/PLATELET
Abs Immature Granulocytes: 0.03 10*3/uL (ref 0.00–0.07)
Basophils Absolute: 0 10*3/uL (ref 0.0–0.1)
Basophils Relative: 0 %
Eosinophils Absolute: 0 10*3/uL (ref 0.0–0.5)
Eosinophils Relative: 0 %
HCT: 28.1 % — ABNORMAL LOW (ref 39.0–52.0)
Hemoglobin: 8.9 g/dL — ABNORMAL LOW (ref 13.0–17.0)
Immature Granulocytes: 1 %
Lymphocytes Relative: 12 %
Lymphs Abs: 0.7 10*3/uL (ref 0.7–4.0)
MCH: 32.1 pg (ref 26.0–34.0)
MCHC: 31.7 g/dL (ref 30.0–36.0)
MCV: 101.4 fL — ABNORMAL HIGH (ref 80.0–100.0)
Monocytes Absolute: 0.7 10*3/uL (ref 0.1–1.0)
Monocytes Relative: 12 %
Neutro Abs: 4.5 10*3/uL (ref 1.7–7.7)
Neutrophils Relative %: 75 %
Platelets: 138 10*3/uL — ABNORMAL LOW (ref 150–400)
RBC: 2.77 MIL/uL — ABNORMAL LOW (ref 4.22–5.81)
RDW: 13.8 % (ref 11.5–15.5)
WBC: 5.9 10*3/uL (ref 4.0–10.5)
nRBC: 0 % (ref 0.0–0.2)

## 2020-07-14 SURGERY — ESOPHAGOGASTRODUODENOSCOPY (EGD) WITH PROPOFOL
Anesthesia: Monitor Anesthesia Care

## 2020-07-14 MED ORDER — LIDOCAINE 2% (20 MG/ML) 5 ML SYRINGE
INTRAMUSCULAR | Status: DC | PRN
  Administered 2020-07-14: 100 mg via INTRAVENOUS

## 2020-07-14 MED ORDER — LACTATED RINGERS IV SOLN: INTRAVENOUS | Status: DC | PRN

## 2020-07-14 MED ORDER — PROPOFOL 500 MG/50ML IV EMUL
INTRAVENOUS | Status: DC | PRN
  Administered 2020-07-14: 100 ug/kg/min via INTRAVENOUS

## 2020-07-14 MED ORDER — POLYETHYLENE GLYCOL 3350 17 G PO PACK
17.0000 g | PACK | Freq: Two times a day (BID) | ORAL | Status: DC
  Administered 2020-07-14 – 2020-07-16 (×5): 17 g via ORAL
  Filled 2020-07-14 (×7): qty 1

## 2020-07-14 MED ORDER — HYDROCODONE-ACETAMINOPHEN 5-325 MG PO TABS
1.0000 | ORAL_TABLET | Freq: Once | ORAL | Status: AC
  Administered 2020-07-14: 1 via ORAL
  Filled 2020-07-14: qty 1

## 2020-07-14 MED ORDER — MINERAL OIL RE ENEM
1.0000 | ENEMA | Freq: Once | RECTAL | Status: AC
  Administered 2020-07-14: 1 via RECTAL
  Filled 2020-07-14: qty 1

## 2020-07-14 MED ORDER — APIXABAN 2.5 MG PO TABS
2.5000 mg | ORAL_TABLET | Freq: Two times a day (BID) | ORAL | Status: DC
  Administered 2020-07-14 – 2020-07-17 (×6): 2.5 mg via ORAL
  Filled 2020-07-14 (×7): qty 1

## 2020-07-14 MED ORDER — PROPOFOL 10 MG/ML IV BOLUS
INTRAVENOUS | Status: DC | PRN
  Administered 2020-07-14: 20 mg via INTRAVENOUS
  Administered 2020-07-14: 60 mg via INTRAVENOUS
  Administered 2020-07-14: 20 mg via INTRAVENOUS

## 2020-07-14 MED ORDER — ACETAMINOPHEN 500 MG PO TABS
500.0000 mg | ORAL_TABLET | Freq: Three times a day (TID) | ORAL | Status: DC
  Administered 2020-07-14 – 2020-07-17 (×10): 500 mg via ORAL
  Filled 2020-07-14 (×10): qty 1

## 2020-07-14 SURGICAL SUPPLY — 15 items

## 2020-07-14 NOTE — Progress Notes (Signed)
Daily Rounding Note  07/14/2020, 9:27 AM  LOS: 2 days   SUBJECTIVE:   Chief complaint:     Constipation w urge to defecate but unable, causing stomach and rectal pain.  sxs much improved after mineral oil enema, vicodin and large BM.    OBJECTIVE:         Vital signs in last 24 hours:    Temp:  [97.8 F (36.6 C)-98.5 F (36.9 C)] 97.8 F (36.6 C) (07/16 0537) Pulse Rate:  [73-90] 78 (07/16 0537) Resp:  [17-18] 18 (07/16 0537) BP: (110-137)/(43-65) 110/43 (07/16 0537) SpO2:  [95 %-97 %] 97 % (07/16 0537) Last BM Date: 07/05/20 Filed Weights   07/10/20 2112  Weight: 73.5 kg   Not re-examined.    Intake/Output from previous day: 07/15 0701 - 07/16 0700 In: 1574.7 [P.O.:650; I.V.:924.7] Out: 600 [Urine:600]  Intake/Output this shift: No intake/output data recorded.  Lab Results: Recent Labs    07/12/20 0244 07/13/20 0330 07/14/20 0625  WBC 5.7 6.0 5.9  HGB 10.5* 10.2* 8.9*  HCT 33.4* 31.5* 28.1*  PLT 143* 151 138*   BMET Recent Labs    07/12/20 0244 07/13/20 0330 07/14/20 0625  NA 136 136 138  K 4.4 4.3 4.4  CL 101 100 104  CO2 23 24 23   GLUCOSE 104* 102* 120*  BUN 27* 39* 46*  CREATININE 1.47* 1.82* 1.64*  CALCIUM 9.6 9.3 9.2   LFT Recent Labs    07/13/20 0330 07/14/20 0625  PROT 6.6 6.3*  ALBUMIN 3.4* 3.1*  AST 39 68*  ALT 49* 82*  ALKPHOS 149* 164*  BILITOT 1.6* 1.0   PT/INR No results for input(s): LABPROT, INR in the last 72 hours. Hepatitis Panel No results for input(s): HEPBSAG, HCVAB, HEPAIGM, HEPBIGM in the last 72 hours.  Studies/Results: NM Hepato W/EF  Result Date: 07/12/2020 CLINICAL DATA:  Upper abdominal pain EXAM: NUCLEAR MEDICINE HEPATOBILIARY IMAGING WITH GALLBLADDER EF TECHNIQUE: Sequential images of the abdomen were obtained out to 60 minutes following intravenous administration of radiopharmaceutical. After oral ingestion of Ensure, gallbladder ejection  fraction was determined. At 60 min, normal ejection fraction is greater than 33%. RADIOPHARMACEUTICALS:  4.7 mCi Tc-74m  Choletec IV COMPARISON:  07/11/2020 FINDINGS: Prompt uptake and biliary excretion of activity by the liver is seen. Gallbladder activity is visualized, consistent with patency of cystic duct. Biliary activity passes into small bowel, consistent with patent common bile duct. Calculated gallbladder ejection fraction is 28%. (Normal gallbladder ejection fraction with Ensure is greater than 33%.) IMPRESSION: 1. No evidence of acute cholecystitis or biliary obstruction. 2. Slightly diminished ejection fraction which can be seen with chronic cholecystitis or biliary dyskinesia. Electronically Signed   By: Randa Ngo M.D.   On: 07/12/2020 17:56    ASSESMENT:   *   Migrating Abdominal pain. Elevated LFTs. Lipase 70 >> 46 over last 5 d.  Chronic elevation IgM.    Diminished gallbladder EF on HIDA; likely small GB polyps and uncomplicated liver cyst on ultrasound.  GI findings unremarkable other than sigmoid diverticulosis on CT but this did reveal some advanced aortic and branch vessel stenosis, so question mesenteric ischemia? Also reveals non-obstructing bladder and kidney stones. U/A is unremarkable so less likely sxs from GU stones.       *   Macrocytic anemia  *    Hearing loss  *    Chronic Eliquis.  Last dose 7/12.  *   Urinary bladder stone  *  MGUS.  IgG lambda monoclonal gammopathy of undetermined significance followed by Dr Marin Olp.  Chronic elevation IgM.     PLAN   *   EGD today at 1315.      Azucena Freed  07/14/2020, 9:27 AM Phone [phone]

## 2020-07-14 NOTE — TOC Progression Note (Signed)
Transition of Care [facility]) - Progression Note    Patient Details  Name: [name] MRN: [id number] Date of Birth: 1929/06/11  Transition of Care [facility] CM/SW Contact  Jacalyn Lefevre Edson Snowball, RN Phone Number: 07/14/2020, 12:29 PM  Clinical Narrative:     Daughters [name] and [name] called NCM on speaker phone. They have reviewed SNF bed offers and have chosen [facility]. [name] [phone] would like someone from [facility] to call her directly . Irine Seal at [facility] will check patient's benefits and then call [name].     Expected Discharge Plan: [place] Barriers to Discharge: Continued Medical Work up  Expected Discharge Plan and Services Expected Discharge Plan: [place]   Discharge Planning Services: CM Consult Post Acute Care Choice: [place] arrangements for the past 2 months: Single Family Home                 DME Arranged: N/A DME Agency: NA       HH Arranged: PT, OT HH Agency: Other - See comment ([place]) Date [place]: 07/12/20 Time [place]: 1115 Representative spoke with at [place]: [place] ([place]) Interventions    Readmission Risk Interventions No flowsheet data found.

## 2020-07-14 NOTE — Progress Notes (Signed)
Patient to Endo via wheelchair.

## 2020-07-14 NOTE — Anesthesia Postprocedure Evaluation (Signed)
Anesthesia Post Note  Patient: [name]  Procedure(s) Performed: ESOPHAGOGASTRODUODENOSCOPY (EGD) WITH PROPOFOL (N/A )     Patient location during evaluation: Endoscopy Anesthesia Type: MAC Level of consciousness: oriented, patient cooperative and sedated Pain management: pain level controlled Vital Signs Assessment: post-procedure vital signs reviewed and stable Respiratory status: spontaneous breathing, nonlabored ventilation, respiratory function stable and patient connected to nasal cannula oxygen Cardiovascular status: stable and blood pressure returned to baseline Postop Assessment: no apparent nausea or vomiting Anesthetic complications: no   No complications documented.  Last Vitals:  Vitals:   07/14/20 1455 07/14/20 1529  BP: (!) 150/76 (!) 146/62  Pulse: (!) 59 62  Resp: 16 16  Temp:  36.5 C  SpO2: 96% 99%    Last Pain:  Vitals:   07/14/20 1529  TempSrc: Oral  PainSc:                  Glynn Yepes,E. Rayburn Mundis

## 2020-07-14 NOTE — Anesthesia Preprocedure Evaluation (Signed)
Anesthesia Evaluation  Patient identified by MRN, date of birth, ID band Patient awake    Reviewed: Allergy & Precautions, NPO status , Patient's Chart, lab work & pertinent test results  History of Anesthesia Complications Negative for: history of anesthetic complications  Airway Mallampati: II  TM Distance: >3 FB Neck ROM: Full    Dental  (+) Edentulous Upper, Edentulous Lower   Pulmonary COPD,  COPD inhaler, former smoker,  07/11/2020 SARS coronavirus NEG   breath sounds clear to auscultation       Cardiovascular hypertension, Pt. on medications and Pt. on home beta blockers + CAD, + CABG and + Peripheral Vascular Disease  + dysrhythmias Atrial Fibrillation + pacemaker (SSS)  Rhythm:Regular Rate:Normal  '18 ECHO: Normal LV size with EF 55-60%. Moderate diastolic dysfunction. Normal RV size and systolic function. Mildly D-shapedinterventricular septum, suggesting RV pressure/volume overload. Moderate pulmonary hypertension. Moderate tricuspid regurgitation.    Neuro/Psych negative neurological ROS     GI/Hepatic Elevated LFTs abd pain   Endo/Other    Renal/GU Renal InsufficiencyRenal disease (creat 1.64)     Musculoskeletal   Abdominal   Peds  Hematology  (+) Blood dyscrasia (Hb 8.9), anemia , eliquis   Anesthesia Other Findings   Reproductive/Obstetrics                             Anesthesia Physical Anesthesia Plan  ASA: III  Anesthesia Plan: MAC   Post-op Pain Management:    Induction:   PONV Risk Score and Plan: 1 and Treatment may vary due to age or medical condition  Airway Management Planned: Natural Airway and Nasal Cannula  Additional Equipment: None  Intra-op Plan:   Post-operative Plan:   Informed Consent: I have reviewed the patients History and Physical, chart, labs and discussed the procedure including the risks, benefits and alternatives for the proposed  anesthesia with the patient or authorized representative who has indicated his/her understanding and acceptance.    Discussed DNR with patient and Suspend DNR.     Plan Discussed with: CRNA and Surgeon  Anesthesia Plan Comments:         Anesthesia Quick Evaluation

## 2020-07-14 NOTE — Op Note (Addendum)
[facility] Patient Name: [name] Procedure Date : 07/14/2020 MRN: [id number] Attending MD: Gatha Mayer , MD Date of Birth: 1929-05-30 CSN: [id number] Age: 86 Admit Type: Inpatient Procedure:                Upper GI endoscopy Indications:              Upper abdominal pain Providers:                Gatha Mayer, MD, Jeanella Cara, RN,                            Laverda Sorenson, Technician, Tyrone Apple,                            Technician, Elmer Sow Referring MD:              Medicines:                Propofol per Anesthesia, Monitored Anesthesia Care Complications:            No immediate complications. Estimated Blood Loss:     Estimated blood loss: none. Procedure:                Pre-Anesthesia Assessment:                           - Prior to the procedure, a History and Physical                            was performed, and patient medications and                            allergies were reviewed. The patient's tolerance of                            previous anesthesia was also reviewed. The risks                            and benefits of the procedure and the sedation                            options and risks were discussed with the patient.                            All questions were answered, and informed consent                            was obtained. Prior Anticoagulants: The patient has                            taken Eliquis (apixaban), last dose was 2 days                            prior to procedure. ASA Grade Assessment: III - A  patient with severe systemic disease. After                            reviewing the risks and benefits, the patient was                            deemed in satisfactory condition to undergo the                            procedure.                           After obtaining informed consent, the endoscope was                            passed under direct vision.  Throughout the                            procedure, the patient's blood pressure, pulse, and                            oxygen saturations were monitored continuously. The                            GIF-H190 ([id number]) Olympus gastroscope was                            introduced through the mouth, and advanced to the                            second part of duodenum. The upper GI endoscopy was                            accomplished without difficulty. The patient                            tolerated the procedure well. Scope In: Scope Out: Findings:      The esophagus was normal.      The stomach was normal.      The examined duodenum was normal.      The cardia and gastric fundus were normal on retroflexion. Impression:               - Normal esophagus.                           - Normal stomach.                           - Normal examined duodenum.                           - No specimens collected.                           - Cause of left and right-sided abdominal pain not  clear to me - I doubt it is GI or all GI in origin. Recommendation:           - Resume previous diet.                           - Continue present medications.                           - Resume Eliquis (apixaban) at prior dose today ok                            by me. defer to [facility]                           - Reviewed results with daughter - discussed                            possibility of nephrolithiasis as a cause - that                            ispossible but alsounclear and no ureteral stones                            seen though was a contrasted exam. Consider                            unenhanced CT scan to better check ureters. She is                            asking that urology see him in the hospital. ? if                            urinary bladder stone is new or old as he had a                            bladder stone in 2017 also.                           -  signing off [place] at 74 would be careful with and                            have low threshold to dc dicyclomine - I doubt that                            is necessary in him                           - do agree w/ better bowel regimen as you are                            pursuing Procedure Code(s):        --- Professional ---  86754, Esophagogastroduodenoscopy, flexible,                            transoral; diagnostic, including collection of                            specimen(s) by brushing or washing, when performed                            (separate procedure) Diagnosis Code(s):        --- Professional ---                           R10.10, Upper abdominal pain, unspecified CPT copyright 2019 American Medical Association. All rights reserved. The codes documented in this report are preliminary and upon coder review may  be revised to meet current compliance requirements. Gatha Mayer, MD 07/14/2020 2:34:51 PM This report has been signed electronically. Number of Addenda: 0

## 2020-07-14 NOTE — Progress Notes (Signed)
Patient returned to room from Endo. VS stable. No c/o's pain.

## 2020-07-14 NOTE — Progress Notes (Signed)
PROGRESS NOTE    [name]  LPF:[id number] DOB: 1929/03/15 DOA: 07/10/2020 PCP: Cyndi Bender, PA-C    Brief Narrative:  86 year old gentleman with history of pacemaker placement, chronic atrial fibrillation anticoagulated with Eliquis, MGUS, CAD and COPD presented with about 6 months of on and off abdominal pain and recently getting worse.  His pain is mostly in the right upper quadrant and occasionally on the left lateral quadrants.  He had episodes of pain at home where he has severe spasm on his abdomin and feels like he cannot breathe.  He is also usually constipated.  No fever.  Seen by multiple providers outpatient.  Patient also noted to be progressively weak and difficulty ambulation.  Patient also noted to be not eating well.  Sleeping excessively.  Recent outpatient ultrasound showed cholelithiasis, ultrasound in the ER showed a small gallbladder polyp with no acute cholecystitis.  Mild elevated transaminases normalized. CT scan abdomen pelvis with no acute findings.  Not on any acid suppression. HIDA scan did not show any evidence of chronic cholecystitis, minimally suppressed ejection fraction with no clinical significance. Some improvement of pain with bowel movement.  Pain worse on movement of the body.  Assessment & Plan:   Principal Problem:   Failure to thrive in adult Active Problems:   MGUS (monoclonal gammopathy of unknown significance)   Paroxysmal atrial flutter (HCC)   COPD (chronic obstructive pulmonary disease) (HCC)   Hyperlipidemia   CKD (chronic kidney disease) stage 3, GFR 30-59 ml/min   RLQ abdominal pain   Gallbladder polyp   Abdominal pain   Goals of care, counseling/discussion   Palliative care by specialist  Abdominal pain:  Episodic severe abdominal pain with no definite cause found.   Patient has multiple episodes of these abdominal pains with no definite diagnosis.  With significant symptoms, remains in the hospital for  investigation. -CT scan abdomen pelvis with contrast with no acute findings.  Right upper quadrant ultrasound with some evidence of chronic liver disease.   LFTs mildly abnormal transaminases, bilirubin normal.  nonobstructive kidney stones. -HIDA scan with very mild decrease in ejection fraction.  Have discussed case with surgery, they recommended no intervention. -CT scan with evidence of nonobstructive kidney stones, 4 mm stone in the bladder, patient probably had passed ureteric stone into the bladder.  Discussed case with urology, they will follow up in 1 week after discharge. -Patient without any history of upper GI endoscopy, undergoing EGD today. -Currently symptomatic treatment.  Started on Pepcid.  Will wait for EGD to see patient benefits with PPI.  - start on aggressive bowel regimen, Will start patient on low dose scheduled tylenol and bentyl, increase miralax to twice daily to hope for good/regular bowel movement.    Chronic atrial fibrillation: Well rate controlled on Lopressor and digoxin.  On Eliquis for anticoagulation.  Holding for procedure. Does have outpatient cardiology follow-up.  COPD without exacerbation: Stable on albuterol and revefenacin.  Continue.  Stage IIIa chronic kidney disease: creatinine stabilizing on iv fluids. Will monitor. old lasix.  Poor appetite, failure to thrive, MGUS: progressive decline in health. Will investigate for any reversible causes. Consult PT/OT. May need rehab/ home therapies.  Hearing loss: Bilateral. Left more than right.  Patient has keratosis obliterans of the left ear.  Gets removal under microscope every 2 weeks at [facility]. On family request, a call was placed to on-call physician on 7/15 to discuss about possibility of doing procedure in the hospital.  Unable to do outpatient procedure  in the hospital. They will follow up after discharge as soon as possible.  DVT prophylaxis:  eliquis , on hold.   Code Status:  DNR Family Communication: daughter [name] on the phone 7/15, will call  Disposition Plan: Status is: Inpatient  The patient will require care spanning > 2 midnights and should be moved to inpatient because: Ongoing diagnostic testing needed not appropriate for outpatient work up, Unsafe d/c plan and IV treatments appropriate due to intensity of illness or inability to take PO  Dispo: The patient is from: Home              Anticipated d/c is to: Skilled nursing facility              Anticipated d/c date is: 2 days              Patient currently is not medically stable to d/c.  Work-up underway.    Consultants:   Surgery , curbside  GI,  Urology, curbside  ENT, phone consultation  Procedures:   None   For EGD today.   Antimicrobials:   None     Subjective: Patient was seen and examined.  "I do not hurt unless I move" Had large bowel movement last night with some improvement. No family at bedside.  Going for endoscopy.  Objective: Vitals:   07/13/20 1422 07/13/20 2043 07/14/20 0537 07/14/20 0940  BP: 115/65 137/61 (!) 110/43   Pulse: 73 90 78   Resp: 17 17 18    Temp: 98.5 F (36.9 C) 97.9 F (36.6 C) 97.8 F (36.6 C)   TempSrc: Oral  Oral   SpO2: 95% 97% 97% 94%  Weight:      Height:        Intake/Output Summary (Last 24 hours) at 07/14/2020 1049 Last data filed at 07/14/2020 0600 Gross per 24 hour  Intake 1454.66 ml  Output 600 ml  Net 854.66 ml   Filed Weights   07/10/20 2112  Weight: 73.5 kg    Examination:  Physical Exam Constitutional:      Comments: Chronically sick looking gentleman, not in distress.  Hard of hearing.  HENT:     Head: Atraumatic.  Cardiovascular:     Rate and Rhythm: Regular rhythm.     Comments: Pacemaker in place Pulmonary:     Breath sounds: Normal breath sounds.  Abdominal:     General: Abdomen is flat. There is no distension.     Comments: No localized tenderness.  Mild pain on the right lateral quadrants.  No  rigidity or guarding.  Neurological:     General: No focal deficit present.     Mental Status: He is oriented to person, place, and time.       Data Reviewed: I have personally reviewed following labs and imaging studies  CBC: Recent Labs  Lab 07/10/20 1649 07/12/20 0244 07/13/20 0330 07/14/20 0625  WBC 4.7 5.7 6.0 5.9  NEUTROABS  --   --  4.1 4.5  HGB 10.7* 10.5* 10.2* 8.9*  HCT 32.7* 33.4* 31.5* 28.1*  MCV 101.2* 100.6* 101.6* 101.4*  PLT 160 143* 151 397*   Basic Metabolic Panel: Recent Labs  Lab 07/10/20 1649 07/12/20 0244 07/13/20 0330 07/14/20 0625  NA 139 136 136 138  K 4.4 4.4 4.3 4.4  CL 105 101 100 104  CO2 24 23 24 23   GLUCOSE 105* 104* 102* 120*  BUN 21 27* 39* 46*  CREATININE 1.28* 1.47* 1.82* 1.64*  CALCIUM 9.3 9.6 9.3  9.2  MG  --   --  2.1  --   PHOS  --   --  4.8*  --    GFR: Estimated Creatinine Clearance: 31.1 mL/min (A) (by C-G formula based on SCr of 1.64 mg/dL (H)). Liver Function Tests: Recent Labs  Lab 07/10/20 1649 07/13/20 0330 07/14/20 0625  AST 56* 39 68*  ALT 60* 49* 82*  ALKPHOS 152* 149* 164*  BILITOT 0.8 1.6* 1.0  PROT 6.4* 6.6 6.3*  ALBUMIN 3.4* 3.4* 3.1*   Recent Labs  Lab 07/10/20 1649 07/13/20 0330  LIPASE 70* 46   No results for input(s): AMMONIA in the last 168 hours. Coagulation Profile: No results for input(s): INR, PROTIME in the last 168 hours. Cardiac Enzymes: No results for input(s): CKTOTAL, CKMB, CKMBINDEX, TROPONINI in the last 168 hours. BNP (last 3 results) No results for input(s): PROBNP in the last 8760 hours. HbA1C: No results for input(s): HGBA1C in the last 72 hours. CBG: No results for input(s): GLUCAP in the last 168 hours. Lipid Profile: No results for input(s): CHOL, HDL, LDLCALC, TRIG, CHOLHDL, LDLDIRECT in the last 72 hours. Thyroid Function Tests: No results for input(s): TSH, T4TOTAL, FREET4, T3FREE, THYROIDAB in the last 72 hours. Anemia Panel: No results for input(s):  VITAMINB12, FOLATE, FERRITIN, TIBC, IRON, RETICCTPCT in the last 72 hours. Sepsis Labs: No results for input(s): PROCALCITON, LATICACIDVEN in the last 168 hours.  Recent Results (from the past 240 hour(s))  SARS CORONAVIRUS 2 (TAT 6-24 HRS) Nasopharyngeal Nasopharyngeal Swab     Status: None   Collection Time: 07/11/20  8:35 AM   Specimen: Nasopharyngeal Swab  Result Value Ref Range Status   SARS Coronavirus 2 NEGATIVE NEGATIVE Final    Comment: (NOTE) SARS-CoV-2 target nucleic acids are NOT DETECTED.  The SARS-CoV-2 RNA is generally detectable in upper and lower respiratory specimens during the acute phase of infection. Negative results do not preclude SARS-CoV-2 infection, do not rule out co-infections with other pathogens, and should not be used as the sole basis for treatment or other patient management decisions. Negative results must be combined with clinical observations, patient history, and epidemiological information. The expected result is Negative.  Fact Sheet for Patients: SugarRoll.be  Fact Sheet for Healthcare Providers: https://www.woods-mathews.com/  This test is not yet approved or cleared by the [place] FDA and  has been authorized for detection and/or diagnosis of SARS-CoV-2 by FDA under an Emergency Use Authorization (EUA). This EUA will remain  in effect (meaning this test can be used) for the duration of the COVID-19 declaration under Se ction 564(b)(1) of the Act, 21 U.S.C. section 360bbb-3(b)(1), unless the authorization is terminated or revoked sooner.  Performed at [facility], [place] [address], [place], [place] [address]          Radiology Studies: NM Hepato W/EF  Result Date: 07/12/2020 CLINICAL DATA:  Upper abdominal pain EXAM: NUCLEAR MEDICINE HEPATOBILIARY IMAGING WITH GALLBLADDER EF TECHNIQUE: Sequential images of the abdomen were obtained out to 60 minutes following intravenous  administration of radiopharmaceutical. After oral ingestion of Ensure, gallbladder ejection fraction was determined. At 60 min, normal ejection fraction is greater than 33%. RADIOPHARMACEUTICALS:  4.7 mCi Tc-4m  Choletec IV COMPARISON:  07/11/2020 FINDINGS: Prompt uptake and biliary excretion of activity by the liver is seen. Gallbladder activity is visualized, consistent with patency of cystic duct. Biliary activity passes into small bowel, consistent with patent common bile duct. Calculated gallbladder ejection fraction is 28%. (Normal gallbladder ejection fraction with Ensure  is greater than 33%.) IMPRESSION: 1. No evidence of acute cholecystitis or biliary obstruction. 2. Slightly diminished ejection fraction which can be seen with chronic cholecystitis or biliary dyskinesia. Electronically Signed   By: Randa Ngo M.D.   On: 07/12/2020 17:56        Scheduled Meds: . acetaminophen  500 mg Oral Q8H  . acidophilus  1 capsule Oral QHS  . atorvastatin  40 mg Oral Daily  . brimonidine  1 drop Both Eyes TID  . brinzolamide  1 drop Both Eyes TID  . cycloSPORINE  1 drop Both Eyes BID  . dicyclomine  10 mg Oral TID AC  . digoxin  0.0625 mg Oral Daily  . famotidine  20 mg Oral BID  . feeding supplement (ENSURE ENLIVE)  237 mL Oral TID BM  . metoprolol tartrate  75 mg Oral BID  . midodrine  2.5 mg Oral BID WC  . multivitamin with minerals  1 tablet Oral Daily  . niacin  500 mg Oral QHS  . polyethylene glycol  17 g Oral BID  . revefenacin  175 mcg Nebulization Daily  . senna-docusate  1 tablet Oral BID  . sertraline  25 mg Oral Daily  . tamsulosin  0.4 mg Oral QHS   Continuous Infusions: . sodium chloride 75 mL/hr at 07/13/20 1531     LOS: 2 days    Time spent: 30 minutes    Barb Merino, MD [facility] Pager [phone]

## 2020-07-14 NOTE — Interval H&P Note (Signed)
History and Physical Interval Note:  07/14/2020 1:45 PM  [name]  has presented today for surgery, with the diagnosis of upper abdominal pain.  The various methods of treatment have been discussed with the patient and family. After consideration of risks, benefits and other options for treatment, the patient has consented to  Procedure(s): ESOPHAGOGASTRODUODENOSCOPY (EGD) WITH PROPOFOL (N/A) as a surgical intervention.  The patient's history has been reviewed, patient examined, no change in status, stable for surgery.  I have reviewed the patient's chart and labs.  Questions were answered to the patient's satisfaction.     Silvano Rusk

## 2020-07-14 NOTE — Transfer of Care (Signed)
Immediate Anesthesia Transfer of Care Note  Patient: BRODEY BONN  Procedure(s) Performed: ESOPHAGOGASTRODUODENOSCOPY (EGD) WITH PROPOFOL (N/A )  Patient Location: Endoscopy Unit  Anesthesia Type:MAC  Level of Consciousness: drowsy  Airway & Oxygen Therapy: Patient Spontanous Breathing and Patient connected to nasal cannula oxygen  Post-op Assessment: Report given to RN and Post -op Vital signs reviewed and stable  Post vital signs: Reviewed and stable  Last Vitals:  Vitals Value Taken Time  BP 98/50 07/14/20 1425  Temp 36.7 C 07/14/20 1424  Pulse 67 07/14/20 1427  Resp 17 07/14/20 1427  SpO2 100 % 07/14/20 1427  Vitals shown include unvalidated device data.  Last Pain:  Vitals:   07/14/20 1424  TempSrc: Temporal  PainSc: 0-No pain      Patients Stated Pain Goal: 0 (33/43/56 8616)  Complications: No complications documented.

## 2020-07-14 NOTE — Progress Notes (Signed)
Pt frequently get up to use the BR. Said that he has the urge to have a BM but not able to. C/o pain at the rectal area as well as nausea. Tylenol/zofran was given. Asked if he can have a suppository, on call NP paged and ordered to gave one. 0125 still not able to have a BM, said " I feel so miserable, and it hurts a lot on my stomach and rectal area. Situation relayed to NP, and ordered to give 1 vicodin and a mineral oil enema. 0245 Pt successfully had a large BM and said that feel so much better.

## 2020-07-15 LAB — SARS CORONAVIRUS 2 BY RT PCR (HOSPITAL ORDER, PERFORMED IN ~~LOC~~ HOSPITAL LAB): SARS Coronavirus 2: NEGATIVE

## 2020-07-15 LAB — COMPREHENSIVE METABOLIC PANEL
ALT: 88 U/L — ABNORMAL HIGH (ref 0–44)
AST: 72 U/L — ABNORMAL HIGH (ref 15–41)
Albumin: 3.1 g/dL — ABNORMAL LOW (ref 3.5–5.0)
Alkaline Phosphatase: 179 U/L — ABNORMAL HIGH (ref 38–126)
Anion gap: 11 (ref 5–15)
BUN: 36 mg/dL — ABNORMAL HIGH (ref 8–23)
CO2: 24 mmol/L (ref 22–32)
Calcium: 9.1 mg/dL (ref 8.9–10.3)
Chloride: 107 mmol/L (ref 98–111)
Creatinine, Ser: 1.29 mg/dL — ABNORMAL HIGH (ref 0.61–1.24)
GFR calc Af Amer: 57 mL/min — ABNORMAL LOW (ref >60)
GFR calc non Af Amer: 49 mL/min — ABNORMAL LOW (ref >60)
Glucose, Bld: 94 mg/dL (ref 70–99)
Potassium: 4.3 mmol/L (ref 3.5–5.1)
Sodium: 142 mmol/L (ref 135–145)
Total Bilirubin: 1.5 mg/dL — ABNORMAL HIGH (ref 0.3–1.2)
Total Protein: 6.2 g/dL — ABNORMAL LOW (ref 6.5–8.1)

## 2020-07-15 MED ORDER — FLEET ENEMA 7-19 GM/118ML RE ENEM
1.0000 | ENEMA | Freq: Once | RECTAL | Status: AC
  Administered 2020-07-15: 1 via RECTAL
  Filled 2020-07-15: qty 1

## 2020-07-15 NOTE — Progress Notes (Signed)
Refused fleets enema. Will try to offer throughout the day in case he changes his mind. MD notified.

## 2020-07-15 NOTE — Progress Notes (Signed)
Fleets enema given with medium amount result.

## 2020-07-15 NOTE — Progress Notes (Signed)
PROGRESS NOTE    [name]  AGT:[id number] DOB: 03-08-1929 DOA: 07/10/2020 PCP: Cyndi Bender, PA-C    Brief Narrative:  86 year old gentleman with history of pacemaker placement, chronic atrial fibrillation anticoagulated with Eliquis, MGUS, CAD and COPD presented with about 6 months of on and off abdominal pain and recently getting worse.  His pain is mostly in the right upper quadrant and occasionally on the left lateral quadrants.  He had episodes of pain at home where he has severe spasm on his abdomin and feels like he cannot breathe.  He is also usually constipated.  No fever.  Seen by multiple providers outpatient.  Patient also noted to be progressively weak and difficulty ambulation.  Patient also noted to be not eating well.  Sleeping excessively.  Recent outpatient ultrasound showed cholelithiasis, ultrasound in the ER showed a small gallbladder polyp with no acute cholecystitis.  Mild elevated transaminases normalized. CT scan abdomen pelvis with no acute findings.  Not on any acid suppression. HIDA scan did not show any evidence of chronic cholecystitis, minimally suppressed ejection fraction with no clinical significance. Some improvement of pain with bowel movement.  Pain worse on movement of the body.  Assessment & Plan:   Principal Problem:   Failure to thrive in adult Active Problems:   MGUS (monoclonal gammopathy of unknown significance)   Paroxysmal atrial flutter (HCC)   COPD (chronic obstructive pulmonary disease) (HCC)   Hyperlipidemia   CKD (chronic kidney disease) stage 3, GFR 30-59 ml/min   RLQ abdominal pain   Gallbladder polyp   Abdominal pain   Goals of care, counseling/discussion   Palliative care by specialist  Abdominal pain: Exact etiology unknown. Episodic severe abdominal pain with no definite cause found.   Patient has multiple episodes of these abdominal pains with no definite diagnosis.   -CT scan abdomen pelvis with contrast with no  acute findings.  Right upper quadrant ultrasound with some evidence of chronic liver disease.   LFTs mildly abnormal transaminases, bilirubin normal.  nonobstructive kidney stones. -HIDA scan with very mild decrease in ejection fraction.  Have discussed case with surgery, they recommended no intervention. -CT scan with evidence of nonobstructive kidney stones, 4 mm stone in the bladder, patient probably had passed ureteric stone into the bladder.  Discussed case with urology, they will follow up in 1 week after discharge. -Patient without any history of upper GI endoscopy, underwent EGD with normal findings.  Eliquis resumed. -Currently symptomatic treatment.  Started on Pepcid.  Since EGD did not show any significant findings, will continue Pepcid. - start on aggressive bowel regimen, increased dose of MiraLAX to twice a day, Senokot.  He declined Fleet enema, a good bowel regimen will definitely be helpful. Started on low-dose scheduled Tylenol to see response.  Chronic atrial fibrillation: Well rate controlled on Lopressor and digoxin.  On Eliquis for anticoagulation.   COPD without exacerbation: Stable on albuterol and revefenacin.  Continue.  Acute kidney injury with history of stage IIIa chronic kidney disease: creatinine stabilizing on iv fluids. Will monitor.  Discontinue IV fluids today in recheck level tomorrow morning to ensure stability.  Poor appetite, failure to thrive, MGUS: progressive decline in health. Will investigate for any reversible causes.  Work with PT OT.  Waiting for skilled nursing facility availability.  Hearing loss: Bilateral. Left more than right.  Patient has keratosis obliterans of the left ear.  Gets removal under microscope every 2 weeks at [facility]. On family request, a call was placed to  on-call physician on 7/15 to discuss about possibility of doing procedure in the hospital.  Unable to do outpatient procedure in the hospital. They will follow up after  discharge as soon as possible.  DVT prophylaxis:  eliquis ,    Code Status: DNR Family Communication: daughter [name] on the phone 7/16. Disposition Plan: Status is: Inpatient  The patient will require care spanning > 2 midnights and should be moved to inpatient because: Ongoing diagnostic testing needed not appropriate for outpatient work up, Unsafe d/c plan and IV treatments appropriate due to intensity of illness or inability to take PO  Dispo: The patient is from: Home              Anticipated d/c is to: Skilled nursing facility              Anticipated d/c date is: When available.              Patient currently is medically stable to transfer to skilled level of care when bed available.    Consultants:   Surgery , curbside, HIDA scan reviewed and no intervention recommended.  GI,  Urology, curbside, Dr. Junious Silk will follow up next week.  ENT, phone consultation, the office will follow up.  Procedures:   EGD.  Normal findings.  Antimicrobials:   None     Subjective: Patient seen and examined.  No overnight events.  No pain overnight.  Patient states "I do not hurt, if I move I will hurt"  Objective: Vitals:   07/14/20 1455 07/14/20 1529 07/14/20 2133 07/15/20 0414  BP: (!) 150/76 (!) 146/62 (!) 120/47 (!) 145/62  Pulse: (!) 59 62 69 76  Resp: 16 16 16 16   Temp:  97.7 F (36.5 C) 98.8 F (37.1 C) 97.9 F (36.6 C)  TempSrc:  Oral Oral Oral  SpO2: 96% 99% 98% 97%  Weight:      Height:        Intake/Output Summary (Last 24 hours) at 07/15/2020 1315 Last data filed at 07/14/2020 2229 Gross per 24 hour  Intake 440 ml  Output 350 ml  Net 90 ml   Filed Weights   07/10/20 2112  Weight: 73.5 kg    Examination:  Physical Exam Constitutional:      Comments: Chronically sick looking gentleman, not in distress.  Hard of hearing.  HENT:     Head: Atraumatic.  Cardiovascular:     Rate and Rhythm: Regular rhythm.     Comments: Pacemaker in  place Pulmonary:     Breath sounds: Normal breath sounds.  Abdominal:     General: Abdomen is flat. There is no distension.     Comments: No localized tenderness.  Mild pain on the right lateral quadrants.  No rigidity or guarding.  Neurological:     General: No focal deficit present.     Mental Status: He is oriented to person, place, and time.       Data Reviewed: I have personally reviewed following labs and imaging studies  CBC: Recent Labs  Lab 07/10/20 1649 07/12/20 0244 07/13/20 0330 07/14/20 0625  WBC 4.7 5.7 6.0 5.9  NEUTROABS  --   --  4.1 4.5  HGB 10.7* 10.5* 10.2* 8.9*  HCT 32.7* 33.4* 31.5* 28.1*  MCV 101.2* 100.6* 101.6* 101.4*  PLT 160 143* 151 545*   Basic Metabolic Panel: Recent Labs  Lab 07/10/20 1649 07/12/20 0244 07/13/20 0330 07/14/20 0625 07/15/20 0413  NA 139 136 136 138 142  K 4.4  4.4 4.3 4.4 4.3  CL 105 101 100 104 107  CO2 24 23 24 23 24   GLUCOSE 105* 104* 102* 120* 94  BUN 21 27* 39* 46* 36*  CREATININE 1.28* 1.47* 1.82* 1.64* 1.29*  CALCIUM 9.3 9.6 9.3 9.2 9.1  MG  --   --  2.1  --   --   PHOS  --   --  4.8*  --   --    GFR: Estimated Creatinine Clearance: 39.6 mL/min (A) (by C-G formula based on SCr of 1.29 mg/dL (H)). Liver Function Tests: Recent Labs  Lab 07/10/20 1649 07/13/20 0330 07/14/20 0625 07/15/20 0413  AST 56* 39 68* 72*  ALT 60* 49* 82* 88*  ALKPHOS 152* 149* 164* 179*  BILITOT 0.8 1.6* 1.0 1.5*  PROT 6.4* 6.6 6.3* 6.2*  ALBUMIN 3.4* 3.4* 3.1* 3.1*   Recent Labs  Lab 07/10/20 1649 07/13/20 0330  LIPASE 70* 46   No results for input(s): AMMONIA in the last 168 hours. Coagulation Profile: No results for input(s): INR, PROTIME in the last 168 hours. Cardiac Enzymes: No results for input(s): CKTOTAL, CKMB, CKMBINDEX, TROPONINI in the last 168 hours. BNP (last 3 results) No results for input(s): PROBNP in the last 8760 hours. HbA1C: No results for input(s): HGBA1C in the last 72 hours. CBG: No results  for input(s): GLUCAP in the last 168 hours. Lipid Profile: No results for input(s): CHOL, HDL, LDLCALC, TRIG, CHOLHDL, LDLDIRECT in the last 72 hours. Thyroid Function Tests: No results for input(s): TSH, T4TOTAL, FREET4, T3FREE, THYROIDAB in the last 72 hours. Anemia Panel: No results for input(s): VITAMINB12, FOLATE, FERRITIN, TIBC, IRON, RETICCTPCT in the last 72 hours. Sepsis Labs: No results for input(s): PROCALCITON, LATICACIDVEN in the last 168 hours.  Recent Results (from the past 240 hour(s))  SARS CORONAVIRUS 2 (TAT 6-24 HRS) Nasopharyngeal Nasopharyngeal Swab     Status: None   Collection Time: 07/11/20  8:35 AM   Specimen: Nasopharyngeal Swab  Result Value Ref Range Status   SARS Coronavirus 2 NEGATIVE NEGATIVE Final    Comment: (NOTE) SARS-CoV-2 target nucleic acids are NOT DETECTED.  The SARS-CoV-2 RNA is generally detectable in upper and lower respiratory specimens during the acute phase of infection. Negative results do not preclude SARS-CoV-2 infection, do not rule out co-infections with other pathogens, and should not be used as the sole basis for treatment or other patient management decisions. Negative results must be combined with clinical observations, patient history, and epidemiological information. The expected result is Negative.  Fact Sheet for Patients: SugarRoll.be  Fact Sheet for Healthcare Providers: https://www.woods-mathews.com/  This test is not yet approved or cleared by the [place] FDA and  has been authorized for detection and/or diagnosis of SARS-CoV-2 by FDA under an Emergency Use Authorization (EUA). This EUA will remain  in effect (meaning this test can be used) for the duration of the COVID-19 declaration under Se ction 564(b)(1) of the Act, 21 U.S.C. section 360bbb-3(b)(1), unless the authorization is terminated or revoked sooner.  Performed at [facility], [place] [address]  [place], [place] [address]          Radiology Studies: No results found.      Scheduled Meds: . acetaminophen  500 mg Oral Q8H  . acidophilus  1 capsule Oral QHS  . apixaban  2.5 mg Oral BID  . atorvastatin  40 mg Oral Daily  . brimonidine  1 drop Both Eyes TID  . brinzolamide  1 drop Both  Eyes TID  . cycloSPORINE  1 drop Both Eyes BID  . digoxin  0.0625 mg Oral Daily  . famotidine  20 mg Oral BID  . feeding supplement (ENSURE ENLIVE)  237 mL Oral TID BM  . metoprolol tartrate  75 mg Oral BID  . midodrine  2.5 mg Oral BID WC  . multivitamin with minerals  1 tablet Oral Daily  . niacin  500 mg Oral QHS  . polyethylene glycol  17 g Oral BID  . revefenacin  175 mcg Nebulization Daily  . senna-docusate  1 tablet Oral BID  . sertraline  25 mg Oral Daily  . sodium phosphate  1 enema Rectal Once  . tamsulosin  0.4 mg Oral QHS   Continuous Infusions:    LOS: 3 days    Time spent: 30 minutes    Barb Merino, MD [facility] Pager [phone]

## 2020-07-16 LAB — CBC WITH DIFFERENTIAL/PLATELET
Abs Immature Granulocytes: 0.03 10*3/uL (ref 0.00–0.07)
Basophils Absolute: 0 10*3/uL (ref 0.0–0.1)
Basophils Relative: 0 %
Eosinophils Absolute: 0 10*3/uL (ref 0.0–0.5)
Eosinophils Relative: 1 %
HCT: 27.6 % — ABNORMAL LOW (ref 39.0–52.0)
Hemoglobin: 8.8 g/dL — ABNORMAL LOW (ref 13.0–17.0)
Immature Granulocytes: 1 %
Lymphocytes Relative: 16 %
Lymphs Abs: 0.8 10*3/uL (ref 0.7–4.0)
MCH: 32.7 pg (ref 26.0–34.0)
MCHC: 31.9 g/dL (ref 30.0–36.0)
MCV: 102.6 fL — ABNORMAL HIGH (ref 80.0–100.0)
Monocytes Absolute: 0.7 10*3/uL (ref 0.1–1.0)
Monocytes Relative: 12 %
Neutro Abs: 3.7 10*3/uL (ref 1.7–7.7)
Neutrophils Relative %: 70 %
Platelets: 128 10*3/uL — ABNORMAL LOW (ref 150–400)
RBC: 2.69 MIL/uL — ABNORMAL LOW (ref 4.22–5.81)
RDW: 14 % (ref 11.5–15.5)
WBC: 5.2 10*3/uL (ref 4.0–10.5)
nRBC: 0 % (ref 0.0–0.2)

## 2020-07-16 LAB — BASIC METABOLIC PANEL
Anion gap: 8 (ref 5–15)
BUN: 28 mg/dL — ABNORMAL HIGH (ref 8–23)
CO2: 25 mmol/L (ref 22–32)
Calcium: 9.2 mg/dL (ref 8.9–10.3)
Chloride: 107 mmol/L (ref 98–111)
Creatinine, Ser: 1.21 mg/dL (ref 0.61–1.24)
GFR calc Af Amer: >60 mL/min (ref >60)
GFR calc non Af Amer: 53 mL/min — ABNORMAL LOW (ref >60)
Glucose, Bld: 121 mg/dL — ABNORMAL HIGH (ref 70–99)
Potassium: 4 mmol/L (ref 3.5–5.1)
Sodium: 140 mmol/L (ref 135–145)

## 2020-07-16 NOTE — TOC Progression Note (Signed)
Transition of Care [facility]) - Progression Note    Patient Details  Name: KENDELL SAGRAVES MRN: [id number] Date of Birth: 1929-03-28  Transition of Care [facility]) CM/SW [place], [place] Phone Number: [phone] 07/16/2020, 1:17 PM  Clinical Narrative:     CSW confirmed with MD Dahal that patient is medically stable for discharge. CSW followed up with facility and they stated that they could admit patient tomorrow due to time.  TOC team will continue to assist with discharge planning needs.  Expected Discharge Plan: [place] Barriers to Discharge: Continued Medical Work up  Expected Discharge Plan and Services Expected Discharge Plan: [place]   Discharge Planning Services: CM Consult Post Acute Care Choice: [place] arrangements for the past 2 months: Single Family Home                 DME Arranged: N/A DME Agency: NA       HH Arranged: PT, OT HH Agency: Other - See comment ([place]) Date [place]: 07/12/20 Time [place]: 1115 Representative spoke with at [place]: [place] ([place]) Interventions    Readmission Risk Interventions No flowsheet data found.

## 2020-07-16 NOTE — Discharge Summary (Signed)
Physician Discharge Summary  [name] UVO:[id number] DOB: 08/01/1929 DOA: 07/10/2020  PCP: Cyndi Bender, PA-C  Admit date: 07/10/2020 Discharge date: 07/17/2020  Admitted From: Home Discharge disposition: SNF   Code Status: DNR  Diet Recommendation:  Diet Order            Diet - low sodium heart healthy           Diet regular Room service appropriate? Yes; Fluid consistency: Thin  Diet effective now                 Recommendations for Outpatient Follow-Up:   1. Follow-up with PCP as an outpatient  Discharge Diagnosis:   Principal Problem:   Failure to thrive in adult Active Problems:   MGUS (monoclonal gammopathy of unknown significance)   Paroxysmal atrial flutter (HCC)   COPD (chronic obstructive pulmonary disease) (HCC)   Hyperlipidemia   CKD (chronic kidney disease) stage 3, GFR 30-59 ml/min   RLQ abdominal pain   Gallbladder polyp   Abdominal pain   Goals of care, counseling/discussion   Palliative care by specialist   History of Present Illness / Brief narrative:  Patient is an 86 year old gentleman with history of pacemaker placement, chronic atrial fibrillation anticoagulated with Eliquis, MGUS, CAD and COPD. Patient presented to ED on 7/12 with about 6 months of on and off abdominal pain And recently started to worsen. His pain is mostly in the right upper quadrant and occasionally on the left lateral quadrants.  He had episodes of pain at home where he has severe spasm on his abdomin and feels like he cannot breathe.  He is also usually constipated.  No fever.  Seen by multiple providers outpatient.  Patient also noted to be progressively weak and difficulty ambulation.  Patient also noted to be not eating well.  Sleeping excessively.  Recent outpatient ultrasound showed cholelithiasis, ultrasound in the ER showed a small gallbladder polyp with no acute cholecystitis.  Mild elevated transaminases normalized. CT scan abdomen pelvis with no acute  findings.  Not on any acid suppression. HIDA scan did not show any evidence of chronic cholecystitis, minimally suppressed ejection fraction with no clinical significance. Some improvement of pain with bowel movement.   Pain tends to worsen on any movement of the body.  Hospital Course:  Acute on chronic abdominal pain:  Exact etiology unknown. Episodic severe abdominal pain with no definite cause found.   Patient has multiple episodes of these abdominal pains with no definite diagnosis.   Pain tends to worsen on any movement of the body. -CT scan abdomen pelvis with contrast with no acute findings.  Right upper quadrant ultrasound with some evidence of chronic liver disease.   LFTs mildly abnormal transaminases, bilirubin normal.  nonobstructive kidney stones. -HIDA scan with very mild decrease in ejection fraction.  Have discussed case with surgery, they recommended no intervention. -CT scan with evidence of nonobstructive kidney stones, 4 mm stone in the bladder, patient probably had passed ureteric stone into the bladder.  Discussed case with urology, they will follow up in 1 week after discharge. -Patient without any history of upper GI endoscopy, underwent EGD with normal findings.  Eliquis resumed. -Currently on symptomatic treatment.  Started on Pepcid.  Since EGD did not show any significant findings, will continue Pepcid. -started on aggressive bowel regimen, increased dose of MiraLAX to twice a day, Senokot.   Chronic atrial fibrillation:   -Continue rate control with Lopressor and digoxin.  On Eliquis for anticoagulation.  Hypotension -Continue midodrine 2.5 mg daily twice daily.  COPD without exacerbation: Stable on albuterol and revefenacin. Continue.  Acute kidney injury with history of stage IIIa chronic kidney disease:  -creatinine stabilizing on iv fluids. Creatinine this morning improved to 1.21.  Poor appetite, failure to thrive, MGUS: progressive decline in  health.  Extensively worked up for any reversible causes.  PT OT evaluation obtained.  SNF recommended.  Hearing loss: Bilateral. Left more than right. Patient has keratosis obturans of the left ear. Gets removal under microscope every 2 weeks at [facility]. On family request, a call was placed to on-call physician on 7/15 to discuss about possibility of doing procedure in the hospital.  Unable to do outpatient procedure in the hospital. They will follow up after discharge as soon as possible.  Code Status: DNR  Wound care:    Subjective:  Seen and examined this morning.  Patient elderly Caucasian male.  Lying on bed.  Not in distress.  No pain at rest.  Complains of abdominal pain with movement.  Discharge Exam:   Vitals:   07/16/20 0857 07/16/20 1404 07/16/20 1955 07/17/20 0421  BP:  134/63 132/61 134/60  Pulse:  73 72 71  Resp:  16 16 17   Temp:  99.2 F (37.3 C) 99.1 F (37.3 C) 99 F (37.2 C)  TempSrc:  Oral Oral Oral  SpO2: 96% 97% 97% 97%  Weight:      Height:        Body mass index is 23.92 kg/m.  General exam: Appears calm and comfortable.  Not in pain at rest Skin: No rashes, lesions or ulcers. HEENT: Atraumatic, normocephalic, supple neck, no obvious bleeding Lungs: Clear to auscultate bilaterally CVS: Regular rate and rhythm, no murmur GI/Abd soft, nondistended, tenderness mild to moderate present on left lower quadrant.  Bowel sound present CNS: Alert, awake, oriented to place and person Psychiatry: Mood is good. Extremities: No pedal edema, no calf tenderness  Discharge Instructions:   Discharge Instructions    Diet - low sodium heart healthy   Complete by: As directed    Increase activity slowly   Complete by: As directed       Contact information for follow-up providers    [facility] [place].   Contact information: [address] [address] [phone]        Cyndi Bender, PA-C Follow up.     Specialty: Physician Assistant Contact information: [place] [address] [phone]        Troy Sine, MD .   Specialty: Cardiology Contact information: [address] [place]  [address] [phone]            Contact information for after-discharge care    Destination    [facility] .   Service: Skilled Nursing Contact information: [place] [phone]                 Allergies as of 07/17/2020   No Known Allergies     Medication List    STOP taking these medications   furosemide 40 MG tablet Commonly known as: LASIX     TAKE these medications   acetaminophen 650 MG CR tablet Commonly known as: TYLENOL Take 1 tablet (650 mg total) by mouth every 8 (eight) hours as needed for pain. What changed: how much to take   apixaban 2.5 MG Tabs tablet Commonly known as: Eliquis  Take 1 tablet (2.5 mg total) by mouth 2 (two) times daily.   atorvastatin 40 MG tablet Commonly known as: LIPITOR TAKE 1 TABLET (40 MG TOTAL) BY MOUTH DAILY. KEEP OV. What changed: additional instructions   bisacodyl 5 MG EC tablet Generic drug: bisacodyl Take 5 mg by mouth as needed for mild constipation or moderate constipation.   Chloramphenicol Palmitate Powd Place 3 puffs into both ears daily.   Citrucel 500 MG Tabs Generic drug: Methylcellulose (Laxative) Take 1,000 mg by mouth at bedtime.   [employer] SENIOR PROBIOTIC PO Take 1 capsule by mouth at bedtime. Once daily   Digoxin 62.5 MCG Tabs Take 0.0625 mg by mouth daily. What changed:   medication strength  how much to take   diphenhydramine-acetaminophen 25-500 MG Tabs tablet Commonly known as: TYLENOL PM Take 2 tablets by mouth at bedtime.   Ensure Take 237 mLs by mouth 2 (two) times daily between meals.   famotidine 20 MG tablet Commonly known as: PEPCID Take 1 tablet (20 mg total) by mouth 2 (two) times  daily.   metoprolol tartrate 50 MG tablet Commonly known as: LOPRESSOR TAKE 1.5 TABLETS (75 MG TOTAL) BY MOUTH 2 (TWO) TIMES DAILY.   midodrine 2.5 MG tablet Commonly known as: PROAMATINE Take 1 tablet (2.5 mg total) by mouth 2 (two) times daily with a meal.   multivitamin with minerals Tabs tablet Take 1 tablet by mouth daily.   niacin 500 MG CR tablet Commonly known as: NIASPAN TAKE 1 TABLET BY MOUTH AT BEDTIME. MUST MAKE OFFICE APPOINTMENT What changed: See the new instructions.   polyethylene glycol 17 g packet Commonly known as: MIRALAX / GLYCOLAX Take 17 g by mouth 2 (two) times daily.   ProAir HFA 108 (90 Base) MCG/ACT inhaler Generic drug: albuterol INHALE 2 PUFFS INTO THE  LUNGS EVERY 4 HOURS AS  NEEDED FOR WHEEZING OR  SHORTNESS OF BREATH What changed: See the new instructions.   Restasis 0.05 % ophthalmic emulsion Generic drug: cycloSPORINE Place 1 drop into both eyes 2 (two) times daily.   senna-docusate 8.6-50 MG tablet Commonly known as: Senokot-S Take 1 tablet by mouth 2 (two) times daily.   sertraline 25 MG tablet Commonly known as: ZOLOFT Take 25 mg by mouth daily.   Simbrinza 1-0.2 % Susp Generic drug: Brinzolamide-Brimonidine Place 1 drop into both eyes 2 (two) times daily.   tamsulosin 0.4 MG Caps capsule Commonly known as: FLOMAX Take 0.4 mg by mouth at bedtime.   Yupelri 175 MCG/3ML nebulizer solution Generic drug: revefenacin Inhale 3 mLs (175 mcg total) into the lungs daily.       Time coordinating discharge: 35 minutes  The results of significant diagnostics from this hospitalization (including imaging, microbiology, ancillary and laboratory) are listed below for reference.    Procedures and Diagnostic Studies:   DG Chest 1 View  Result Date: 07/11/2020 CLINICAL DATA:  Right upper quadrant pain EXAM: CHEST  1 VIEW COMPARISON:  06/15/2017 FINDINGS: Hyperinflation. There is no edema, consolidation, effusion, or pneumothorax. Normal  heart size and mediastinal contours. There has been CABG and dual-chamber pacer implant. IMPRESSION: 1. No evidence of acute disease. 2. Hyperinflation. Electronically Signed   By: Monte Fantasia M.D.   On: 07/11/2020 04:52   CT Abdomen Pelvis W Contrast  Result Date: 07/11/2020 CLINICAL DATA:  Upper abdominal pain. EXAM: CT ABDOMEN AND PELVIS WITH CONTRAST TECHNIQUE: Multidetector CT imaging of the abdomen and pelvis was performed using the standard protocol following bolus administration of intravenous contrast.  CONTRAST:  120mL OMNIPAQUE IOHEXOL 350 MG/ML SOLN COMPARISON:  CT scan 02/20/2018 FINDINGS: Lower chest: Left basilar scarring and some atelectasis due to mild eventration left hemidiaphragm. No worrisome pulmonary lesions or pleural effusion. Underlying emphysematous changes are stable. The heart is normal in size. Stable pacer wires. Stable aortic calcifications. Hepatobiliary: Stable low-attenuation lesion in the left hepatic lobe consistent with a benign cyst. No worrisome hepatic lesions or intrahepatic biliary dilatation. Gallbladder is unremarkable. No common bile duct dilatation. Pancreas: No mass, inflammation or ductal dilatation. Spleen: Normal size.  Small splenic cyst. Adrenals/Urinary Tract: The adrenal glands are unremarkable. Bilateral renal calculi are noted. No obstructing ureteral calculi are identified. There are bilateral renal scarring changes and small renal cysts. No worrisome renal lesions or hydronephrosis. There is a 4 mm calculus in the bladder on the left side. No bladder lesions. Right-sided bladder diverticulum is stable. Stomach/Bowel: The stomach, duodenum, small bowel and colon are grossly normal without oral contrast. No acute inflammatory changes, mass lesions or obstructive findings. The terminal ileum is normal. The appendix is not identified for certain but no findings to suggest acute appendicitis. Sigmoid colon diverticulosis but no findings for acute  diverticulitis. Vascular/Lymphatic: Advanced atherosclerotic calcifications involving the aorta and branch vessels but no aneurysm or dissection. No mesenteric or retroperitoneal mass or adenopathy. Reproductive: Mild prostate gland enlargement. Possible prior TURP defect. The seminal vesicles are unremarkable. Other: No pelvic mass or adenopathy. No free pelvic fluid collections. No inguinal mass or adenopathy. No abdominal wall hernia or subcutaneous lesions. Musculoskeletal: No significant bony findings. Stable T12 and L1 compression deformities. IMPRESSION: 1. No acute abdominal/pelvic findings, mass lesions or adenopathy. 2. Bilateral renal calculi but no obstructing ureteral calculi. 3. 4 mm calculus in the left side of the bladder. 4. Stable right-sided bladder diverticulum. 5. Advanced atherosclerotic calcifications involving the aorta and branch vessels. 6. Stable T12 and L1 compression deformities. Aortic Atherosclerosis (ICD10-I70.0). Electronically Signed   By: Marijo Sanes M.D.   On: 07/11/2020 08:12   US Abdomen Limited  Result Date: 07/11/2020 CLINICAL DATA:  Right upper quadrant abdominal pain. EXAM: ULTRASOUND ABDOMEN LIMITED RIGHT UPPER QUADRANT COMPARISON:  Abdominal ultrasound 07/07/2020 FINDINGS: Gallbladder: A few small nonshadowing echogenic structures may be gallbladder polyps. I do not see any definite mobile mobile stones. No gallbladder wall thickening, pericholecystic fluid or sonographic Murphy sign to suggest acute cholecystitis. Common bile duct: Diameter: 4.7 mm Liver: Normal echogenicity. 2.0 x 1.0 cm left hepatic lobe cyst is noted and unchanged since prior CT scans. Portal vein is patent on color Doppler imaging with normal direction of blood flow towards the liver. Other: None. IMPRESSION: 1. Probable small gallbladder polyps. No definite mobile shadowing gallstones. 2. No findings for acute cholecystitis or biliary dilatation. 3. Normal liver except for a stable small simple  cyst in the left hepatic lobe. Electronically Signed   By: Marijo Sanes M.D.   On: 07/11/2020 05:27     Labs:   Basic Metabolic Panel: Recent Labs  Lab 07/12/20 0244 07/12/20 0244 07/13/20 0330 07/13/20 0330 07/14/20 0625 07/14/20 0625 07/15/20 0413 07/16/20 0210  NA 136  --  136  --  138  --  142 140  K 4.4   < > 4.3   < > 4.4   < > 4.3 4.0  CL 101  --  100  --  104  --  107 107  CO2 23  --  24  --  23  --  24 25  GLUCOSE 104*  --  102*  --  120*  --  94 121*  BUN 27*  --  39*  --  46*  --  36* 28*  CREATININE 1.47*  --  1.82*  --  1.64*  --  1.29* 1.21  CALCIUM 9.6  --  9.3  --  9.2  --  9.1 9.2  MG  --   --  2.1  --   --   --   --   --   PHOS  --   --  4.8*  --   --   --   --   --    < > = values in this interval not displayed.   GFR Estimated Creatinine Clearance: 42.2 mL/min (by C-G formula based on SCr of 1.21 mg/dL). Liver Function Tests: Recent Labs  Lab 07/10/20 1649 07/13/20 0330 07/14/20 0625 07/15/20 0413  AST 56* 39 68* 72*  ALT 60* 49* 82* 88*  ALKPHOS 152* 149* 164* 179*  BILITOT 0.8 1.6* 1.0 1.5*  PROT 6.4* 6.6 6.3* 6.2*  ALBUMIN 3.4* 3.4* 3.1* 3.1*   Recent Labs  Lab 07/10/20 1649 07/13/20 0330  LIPASE 70* 46   No results for input(s): AMMONIA in the last 168 hours. Coagulation profile No results for input(s): INR, PROTIME in the last 168 hours.  CBC: Recent Labs  Lab 07/10/20 1649 07/12/20 0244 07/13/20 0330 07/14/20 0625 07/16/20 0210  WBC 4.7 5.7 6.0 5.9 5.2  NEUTROABS  --   --  4.1 4.5 3.7  HGB 10.7* 10.5* 10.2* 8.9* 8.8*  HCT 32.7* 33.4* 31.5* 28.1* 27.6*  MCV 101.2* 100.6* 101.6* 101.4* 102.6*  PLT 160 143* 151 138* 128*   Cardiac Enzymes: No results for input(s): CKTOTAL, CKMB, CKMBINDEX, TROPONINI in the last 168 hours. BNP: Invalid input(s): POCBNP CBG: No results for input(s): GLUCAP in the last 168 hours. D-Dimer No results for input(s): DDIMER in the last 72 hours. Hgb A1c No results for input(s): HGBA1C in  the last 72 hours. Lipid Profile No results for input(s): CHOL, HDL, LDLCALC, TRIG, CHOLHDL, LDLDIRECT in the last 72 hours. Thyroid function studies No results for input(s): TSH, T4TOTAL, T3FREE, THYROIDAB in the last 72 hours.  Invalid input(s): FREET3 Anemia work up No results for input(s): VITAMINB12, FOLATE, FERRITIN, TIBC, IRON, RETICCTPCT in the last 72 hours. Microbiology Recent Results (from the past 240 hour(s))  SARS CORONAVIRUS 2 (TAT 6-24 HRS) Nasopharyngeal Nasopharyngeal Swab     Status: None   Collection Time: 07/11/20  8:35 AM   Specimen: Nasopharyngeal Swab  Result Value Ref Range Status   SARS Coronavirus 2 NEGATIVE NEGATIVE Final    Comment: (NOTE) SARS-CoV-2 target nucleic acids are NOT DETECTED.  The SARS-CoV-2 RNA is generally detectable in upper and lower respiratory specimens during the acute phase of infection. Negative results do not preclude SARS-CoV-2 infection, do not rule out co-infections with other pathogens, and should not be used as the sole basis for treatment or other patient management decisions. Negative results must be combined with clinical observations, patient history, and epidemiological information. The expected result is Negative.  Fact Sheet for Patients: SugarRoll.be  Fact Sheet for Healthcare Providers: https://www.woods-mathews.com/  This test is not yet approved or cleared by the [place] FDA and  has been authorized for detection and/or diagnosis of SARS-CoV-2 by FDA under an Emergency Use Authorization (EUA). This EUA will remain  in effect (meaning this test can be used) for the duration of the COVID-19 declaration under Se ction 564(b)(1) of the  Act, 21 U.S.C. section 360bbb-3(b)(1), unless the authorization is terminated or revoked sooner.  Performed at [facility], [place] [address] [place], [place] [address]   SARS Coronavirus 2 by RT PCR (hospital order, performed  in [facility] hospital lab) Nasopharyngeal Nasopharyngeal Swab     Status: None   Collection Time: 07/15/20  4:44 PM   Specimen: Nasopharyngeal Swab  Result Value Ref Range Status   SARS Coronavirus 2 NEGATIVE NEGATIVE Final    Comment: (NOTE) SARS-CoV-2 target nucleic acids are NOT DETECTED.  The SARS-CoV-2 RNA is generally detectable in upper and lower respiratory specimens during the acute phase of infection. The lowest concentration of SARS-CoV-2 viral copies this assay can detect is 250 copies / mL. A negative result does not preclude SARS-CoV-2 infection and should not be used as the sole basis for treatment or other patient management decisions.  A negative result may occur with improper specimen collection / handling, submission of specimen other than nasopharyngeal swab, presence of viral mutation(s) within the areas targeted by this assay, and inadequate number of viral copies (<250 copies / mL). A negative result must be combined with clinical observations, patient history, and epidemiological information.  Fact Sheet for Patients:   StrictlyIdeas.no  Fact Sheet for Healthcare Providers: BankingDealers.co.za  This test is not yet approved or  cleared by the [place] FDA and has been authorized for detection and/or diagnosis of SARS-CoV-2 by FDA under an Emergency Use Authorization (EUA).  This EUA will remain in effect (meaning this test can be used) for the duration of the COVID-19 declaration under Section 564(b)(1) of the Act, 21 U.S.C. section 360bbb-3(b)(1), unless the authorization is terminated or revoked sooner.  Performed at [facility], [place] [address], [place], [place] [address]     Please note: You were cared for by a hospitalist during your hospital stay. Once you are discharged, your primary care physician will handle any further medical issues. Please note that NO REFILLS for any discharge  medications will be authorized once you are discharged, as it is imperative that you return to your primary care physician (or establish a relationship with a primary care physician if you do not have one) for your post hospital discharge needs so that they can reassess your need for medications and monitor your lab values.  Signed: Marlowe Aschoff Kingdavid Leinbach  [facility] 07/17/2020, 9:13 AM

## 2020-07-16 NOTE — Progress Notes (Signed)
PROGRESS NOTE  [name]  DOB: 11-28-1929  PCP: Cyndi Bender, PA-C NID:[id number]  DOA: 07/10/2020  LOS: 4 days   Chief Complaint  Patient presents with  . Abdominal Pain   Brief narrative: Patient is an 86 year old gentleman with history of pacemaker placement, chronic atrial fibrillation anticoagulated with Eliquis, MGUS, CAD and COPD. Patient presented to ED on 7/12 with about 6 months of on and off abdominal pain And recently started to worsen.His pain is mostly in the right upper quadrant and occasionally on the left lateral quadrants. He had episodes of pain at home where he has severe spasm on his abdomin and feels like he cannot breathe. He is also usually constipated. No fever. Seen by multiple providers outpatient. Patient also noted to be progressively weak and difficulty ambulation. Patient also noted to be not eating well. Sleeping excessively.  Recent outpatient ultrasound showed cholelithiasis, ultrasound in the ER showed a small gallbladder polyp with no acute cholecystitis. Mild elevated transaminases normalized. CT scan abdomen pelvis with no acute findings. Not on any acid suppression. HIDA scan did not show any evidence of chronic cholecystitis, minimally suppressed ejection fraction with no clinical significance. Some improvement of pain with bowel movement.  Pain tends to worsen on any movement of the body.  Subjective: Patient was seen and examined this morning.  Patient elderly Caucasian male.  Sleeping.  Arousable.  Not in distress.  Hard of hearing.  No pain at rest.  Complains of abdominal pain with any movement.  Assessment/Plan: Acute on chronic abdominal pain:  Exact etiology unknown. Episodic severe abdominal pain with no definite cause found.   Patient has multiple episodes of these abdominal pains with no definite diagnosis.   Pain tends to worsen on any movement of the body. -CT scan abdomen pelvis with contrast with no acute  findings.  Right upper quadrant ultrasound with some evidence of chronic liver disease.   LFTs mildly abnormal transaminases, bilirubin normal.  nonobstructive kidney stones. -HIDA scan with very mild decrease in ejection fraction.  Have discussed case with surgery, they recommended no intervention. -CT scan with evidence of nonobstructive kidney stones, 4 mm stone in the bladder, patient probably had passed ureteric stone into the bladder.  Discussed case with urology, they will follow up in 1 week after discharge. -Patient without any history of upper GI endoscopy, underwent EGD with normal findings.  Eliquis resumed. -Currently on symptomatic treatment.  Started on Pepcid.  Since EGD did not show any significant findings, will continue Pepcid. -started on aggressive bowel regimen, increased dose of MiraLAX to twice a day, Senokot.   Chronic atrial fibrillation:  Continue rate control with Lopressor and digoxin.  On Eliquis for anticoagulation.   Hypotension -Continue midodrine 2.5 mg daily twice daily.  COPD without exacerbation: Stable on albuterol and revefenacin.  Continue.  Acute kidney injury with history of stage IIIa chronic kidney disease:  -creatinine stabilizing on iv fluids.  Creatinine this morning improved to 1.21.  Poor appetite, failure to thrive, MGUS: progressive decline in health.  Extensively worked up for any reversible causes.    PT OT evaluation obtained.  SNF recommended.  Hearing loss: Bilateral. Left more than right.  Patient has keratosis obturans of the left ear.  Gets removal under microscope every 2 weeks at [facility]. On family request, a call was placed to on-call physician on 7/15 to discuss about possibility of doing procedure in the hospital.  Unable to do outpatient procedure in the hospital. They will follow  up after discharge as soon as possible.  Mobility: Encourage ambulation.  PT/OT eval obtained.  SNF recommended. Code Status:   Code Status:  DNR  Nutritional status: Body mass index is 23.92 kg/m. Nutrition Problem: Inadequate oral intake Etiology: decreased appetite (related to abdominal pain) Signs/Symptoms: percent weight loss Percent weight loss: 6.6 % (within 3 months) Diet Order            Diet regular Room service appropriate? Yes; Fluid consistency: Thin  Diet effective now                 DVT prophylaxis: apixaban (ELIQUIS) tablet 2.5 mg Start: 07/14/20 1800 apixaban (ELIQUIS) tablet 2.5 mg    Antimicrobials:  None Fluid: None  Consultants:  Surgery , curbside, HIDA scan reviewed and no intervention recommended. GI, Urology, curbside, Dr. Junious Silk will follow up next week. ENT, phone consultation, the office will follow up.  Family Communication:  Daughters not at bedside today  Status is: Inpatient  Remains inpatient appropriate because pending bed availability  Dispo:  Patient From: Home  Planned Disposition: [place]  Expected discharge date: Tomorrow  medically stable for discharge: Yes   Infusions:    Scheduled Meds: . acetaminophen  500 mg Oral Q8H  . acidophilus  1 capsule Oral QHS  . apixaban  2.5 mg Oral BID  . atorvastatin  40 mg Oral Daily  . brimonidine  1 drop Both Eyes TID  . brinzolamide  1 drop Both Eyes TID  . cycloSPORINE  1 drop Both Eyes BID  . digoxin  0.0625 mg Oral Daily  . famotidine  20 mg Oral BID  . feeding supplement (ENSURE ENLIVE)  237 mL Oral TID BM  . metoprolol tartrate  75 mg Oral BID  . midodrine  2.5 mg Oral BID WC  . multivitamin with minerals  1 tablet Oral Daily  . niacin  500 mg Oral QHS  . polyethylene glycol  17 g Oral BID  . revefenacin  175 mcg Nebulization Daily  . senna-docusate  1 tablet Oral BID  . sertraline  25 mg Oral Daily  . tamsulosin  0.4 mg Oral QHS    Antimicrobials: Anti-infectives (From admission, onward)   None      PRN meds: albuterol, ondansetron **OR** ondansetron (ZOFRAN) IV    Objective: Vitals:   07/16/20 0500 07/16/20 0857  BP: 134/60   Pulse: 75   Resp: 16   Temp: 99.1 F (37.3 C)   SpO2: 98% 96%   No intake or output data in the 24 hours ending 07/16/20 1320 Filed Weights   07/10/20 2112  Weight: 73.5 kg   Weight change:  Body mass index is 23.92 kg/m.   Physical Exam: General exam: Appears calm and comfortable.  Not in distress at rest Skin: No rashes, lesions or ulcers. HEENT: Atraumatic, normocephalic, supple neck, no obvious bleeding Lungs: Clear to auscultation bilaterally CVS: Regular rate and rhythm, no murmur GI/Abd soft, nondistended, tenderness mild to moderate present on left lower quadrant.  Bowel sound present CNS: Alert, awake, oriented to place and person Psychiatry: Mood appropriate Extremities: No calf tenderness, no pedal edema  Data Review: I have personally reviewed the laboratory data and studies available.  Recent Labs  Lab 07/10/20 1649 07/12/20 0244 07/13/20 0330 07/14/20 0625 07/16/20 0210  WBC 4.7 5.7 6.0 5.9 5.2  NEUTROABS  --   --  4.1 4.5 3.7  HGB 10.7* 10.5* 10.2* 8.9* 8.8*  HCT 32.7* 33.4* 31.5* 28.1* 27.6*  MCV 101.2* 100.6* 101.6* 101.4* 102.6*  PLT 160 143* 151 138* 128*   Recent Labs  Lab 07/12/20 0244 07/13/20 0330 07/14/20 0625 07/15/20 0413 07/16/20 0210  NA 136 136 138 142 140  K 4.4 4.3 4.4 4.3 4.0  CL 101 100 104 107 107  CO2 23 24 23 24 25   GLUCOSE 104* 102* 120* 94 121*  BUN 27* 39* 46* 36* 28*  CREATININE 1.47* 1.82* 1.64* 1.29* 1.21  CALCIUM 9.6 9.3 9.2 9.1 9.2  MG  --  2.1  --   --   --   PHOS  --  4.8*  --   --   --    Signed, Terrilee Croak, MD [facility] Pager: [phone] (Secure Chat preferred). 07/16/2020

## 2020-07-17 ENCOUNTER — Encounter (HOSPITAL_COMMUNITY): Payer: Self-pay | Admitting: Internal Medicine

## 2020-07-17 DIAGNOSIS — Z7401 Bed confinement status: Secondary | ICD-10-CM | POA: Diagnosis not present

## 2020-07-17 DIAGNOSIS — Z7901 Long term (current) use of anticoagulants: Secondary | ICD-10-CM | POA: Diagnosis not present

## 2020-07-17 DIAGNOSIS — M79601 Pain in right arm: Secondary | ICD-10-CM | POA: Diagnosis not present

## 2020-07-17 DIAGNOSIS — R102 Pelvic and perineal pain: Secondary | ICD-10-CM | POA: Diagnosis not present

## 2020-07-17 DIAGNOSIS — R52 Pain, unspecified: Secondary | ICD-10-CM | POA: Diagnosis not present

## 2020-07-17 DIAGNOSIS — Z20822 Contact with and (suspected) exposure to covid-19: Secondary | ICD-10-CM | POA: Diagnosis not present

## 2020-07-17 DIAGNOSIS — J449 Chronic obstructive pulmonary disease, unspecified: Secondary | ICD-10-CM | POA: Diagnosis not present

## 2020-07-17 DIAGNOSIS — M79661 Pain in right lower leg: Secondary | ICD-10-CM | POA: Diagnosis not present

## 2020-07-17 DIAGNOSIS — R2681 Unsteadiness on feet: Secondary | ICD-10-CM | POA: Diagnosis not present

## 2020-07-17 DIAGNOSIS — G8929 Other chronic pain: Secondary | ICD-10-CM | POA: Diagnosis not present

## 2020-07-17 DIAGNOSIS — D538 Other specified nutritional anemias: Secondary | ICD-10-CM | POA: Diagnosis not present

## 2020-07-17 DIAGNOSIS — R262 Difficulty in walking, not elsewhere classified: Secondary | ICD-10-CM | POA: Diagnosis not present

## 2020-07-17 DIAGNOSIS — R41841 Cognitive communication deficit: Secondary | ICD-10-CM | POA: Diagnosis not present

## 2020-07-17 DIAGNOSIS — M6259 Muscle wasting and atrophy, not elsewhere classified, multiple sites: Secondary | ICD-10-CM | POA: Diagnosis not present

## 2020-07-17 DIAGNOSIS — N200 Calculus of kidney: Secondary | ICD-10-CM | POA: Diagnosis not present

## 2020-07-17 DIAGNOSIS — M79603 Pain in arm, unspecified: Secondary | ICD-10-CM | POA: Diagnosis not present

## 2020-07-17 DIAGNOSIS — R278 Other lack of coordination: Secondary | ICD-10-CM | POA: Diagnosis not present

## 2020-07-17 DIAGNOSIS — M79671 Pain in right foot: Secondary | ICD-10-CM | POA: Diagnosis not present

## 2020-07-17 DIAGNOSIS — R2689 Other abnormalities of gait and mobility: Secondary | ICD-10-CM | POA: Diagnosis not present

## 2020-07-17 DIAGNOSIS — W1789XA Other fall from one level to another, initial encounter: Secondary | ICD-10-CM | POA: Diagnosis not present

## 2020-07-17 DIAGNOSIS — M2550 Pain in unspecified joint: Secondary | ICD-10-CM | POA: Diagnosis not present

## 2020-07-17 DIAGNOSIS — F329 Major depressive disorder, single episode, unspecified: Secondary | ICD-10-CM | POA: Diagnosis not present

## 2020-07-17 DIAGNOSIS — R9431 Abnormal electrocardiogram [ECG] [EKG]: Secondary | ICD-10-CM | POA: Diagnosis not present

## 2020-07-17 DIAGNOSIS — M6281 Muscle weakness (generalized): Secondary | ICD-10-CM | POA: Diagnosis not present

## 2020-07-17 DIAGNOSIS — S199XXA Unspecified injury of neck, initial encounter: Secondary | ICD-10-CM | POA: Diagnosis not present

## 2020-07-17 DIAGNOSIS — I2510 Atherosclerotic heart disease of native coronary artery without angina pectoris: Secondary | ICD-10-CM | POA: Diagnosis not present

## 2020-07-17 DIAGNOSIS — M79631 Pain in right forearm: Secondary | ICD-10-CM | POA: Diagnosis not present

## 2020-07-17 DIAGNOSIS — R627 Adult failure to thrive: Secondary | ICD-10-CM | POA: Diagnosis not present

## 2020-07-17 DIAGNOSIS — R0902 Hypoxemia: Secondary | ICD-10-CM | POA: Diagnosis not present

## 2020-07-17 DIAGNOSIS — S0990XA Unspecified injury of head, initial encounter: Secondary | ICD-10-CM | POA: Diagnosis not present

## 2020-07-17 DIAGNOSIS — Z03818 Encounter for observation for suspected exposure to other biological agents ruled out: Secondary | ICD-10-CM | POA: Diagnosis not present

## 2020-07-17 DIAGNOSIS — W1839XA Other fall on same level, initial encounter: Secondary | ICD-10-CM | POA: Diagnosis not present

## 2020-07-17 DIAGNOSIS — Y939 Activity, unspecified: Secondary | ICD-10-CM | POA: Diagnosis not present

## 2020-07-17 DIAGNOSIS — F4321 Adjustment disorder with depressed mood: Secondary | ICD-10-CM | POA: Diagnosis not present

## 2020-07-17 DIAGNOSIS — W19XXXA Unspecified fall, initial encounter: Secondary | ICD-10-CM | POA: Diagnosis not present

## 2020-07-17 DIAGNOSIS — R1084 Generalized abdominal pain: Secondary | ICD-10-CM | POA: Diagnosis not present

## 2020-07-17 DIAGNOSIS — H9212 Otorrhea, left ear: Secondary | ICD-10-CM | POA: Diagnosis not present

## 2020-07-17 DIAGNOSIS — Y929 Unspecified place or not applicable: Secondary | ICD-10-CM | POA: Diagnosis not present

## 2020-07-17 DIAGNOSIS — M25571 Pain in right ankle and joints of right foot: Secondary | ICD-10-CM | POA: Diagnosis not present

## 2020-07-17 DIAGNOSIS — K5901 Slow transit constipation: Secondary | ICD-10-CM | POA: Diagnosis not present

## 2020-07-17 DIAGNOSIS — R4189 Other symptoms and signs involving cognitive functions and awareness: Secondary | ICD-10-CM | POA: Diagnosis not present

## 2020-07-17 DIAGNOSIS — R109 Unspecified abdominal pain: Secondary | ICD-10-CM | POA: Diagnosis not present

## 2020-07-17 DIAGNOSIS — I672 Cerebral atherosclerosis: Secondary | ICD-10-CM | POA: Diagnosis not present

## 2020-07-17 DIAGNOSIS — Y999 Unspecified external cause status: Secondary | ICD-10-CM | POA: Diagnosis not present

## 2020-07-17 DIAGNOSIS — I951 Orthostatic hypotension: Secondary | ICD-10-CM | POA: Diagnosis not present

## 2020-07-17 DIAGNOSIS — J984 Other disorders of lung: Secondary | ICD-10-CM | POA: Diagnosis not present

## 2020-07-17 DIAGNOSIS — R5381 Other malaise: Secondary | ICD-10-CM | POA: Diagnosis not present

## 2020-07-17 DIAGNOSIS — Z87891 Personal history of nicotine dependence: Secondary | ICD-10-CM | POA: Diagnosis not present

## 2020-07-17 DIAGNOSIS — S22000D Wedge compression fracture of unspecified thoracic vertebra, subsequent encounter for fracture with routine healing: Secondary | ICD-10-CM | POA: Diagnosis not present

## 2020-07-17 DIAGNOSIS — I480 Paroxysmal atrial fibrillation: Secondary | ICD-10-CM | POA: Diagnosis not present

## 2020-07-17 DIAGNOSIS — R404 Transient alteration of awareness: Secondary | ICD-10-CM | POA: Diagnosis not present

## 2020-07-17 MED ORDER — DIGOXIN 62.5 MCG PO TABS: 0.0625 mg | ORAL_TABLET | Freq: Every day | ORAL | Status: AC

## 2020-07-17 MED ORDER — POLYETHYLENE GLYCOL 3350 17 G PO PACK: 17.0000 g | PACK | Freq: Two times a day (BID) | ORAL | 0 refills | Status: AC

## 2020-07-17 MED ORDER — ADULT MULTIVITAMIN W/MINERALS CH: 1.0000 | ORAL_TABLET | Freq: Every day | ORAL | Status: AC

## 2020-07-17 MED ORDER — ACETAMINOPHEN ER 650 MG PO TBCR: 650.0000 mg | EXTENDED_RELEASE_TABLET | Freq: Three times a day (TID) | ORAL | Status: AC | PRN

## 2020-07-17 MED ORDER — FAMOTIDINE 20 MG PO TABS: 20.0000 mg | ORAL_TABLET | Freq: Two times a day (BID) | ORAL | Status: AC

## 2020-07-17 MED ORDER — SENNOSIDES-DOCUSATE SODIUM 8.6-50 MG PO TABS: 1.0000 | ORAL_TABLET | Freq: Two times a day (BID) | ORAL | Status: AC

## 2020-07-17 NOTE — Progress Notes (Signed)
Occupational Therapy Treatment Patient Details Name: [name] MRN: [id number] DOB: 07-Aug-1929 Today's Date: 07/17/2020    History of present illness Pt is a 86 y.o. male with medical history significant of pacemaker placement; afib; MGUS; CAD; and COPD presenting with abdominal pain.  He reports having 6 months of RLQ abdominal pain.  The pain is intermittent in nature and sharp with certain movements.  He has persistent nausea that he relates to the pain.  Additionally, pt with progressive weakness.  Pt's principal problem per H and P is failure to thrive and R LQ abdominal pain.   OT comments  Pt HOH so gestures used mostly. Pt modified independent with bed mobility and minguardA overall for mobility in room with RW. Pt performing toilet transfer, toilet hygiene and grooming at sink with minguardA overall. Pt with no LOB episodes and no pain reported. Pt would benefit from continued OT skilled services for ADL, mobility and safety. OT following acutely.    Follow Up Recommendations  SNF;Supervision/Assistance - 24 hour    Equipment Recommendations  Other (comment) (defer to next facility)    Recommendations for Other Services      Precautions / Restrictions Precautions Precautions: Fall Restrictions Weight Bearing Restrictions: No       Mobility Bed Mobility Overal bed mobility: Modified Independent                Transfers Overall transfer level: Needs assistance Equipment used: Rolling walker (2 wheeled) Transfers: Sit to/from Stand Sit to Stand: Min guard              Balance Overall balance assessment: Needs assistance Sitting-balance support: No upper extremity supported Sitting balance-Leahy Scale: Good     Standing balance support: During functional activity;No upper extremity supported Standing balance-Leahy Scale: Fair                             ADL either performed or assessed with clinical judgement   ADL Overall ADL's  : Needs assistance/impaired     Grooming: Supervision/safety;Standing Grooming Details (indicate cue type and reason): pt standing at sink for light grooming; no physical assist required.              Lower Body Dressing: Min guard;Sitting/lateral leans;Sit to/from stand Lower Body Dressing Details (indicate cue type and reason): pulling pants down and pulling up to waist for toileting task Toilet Transfer: Min guard;Ambulation;RW   Toileting- Clothing Manipulation and Hygiene: Min guard;Sitting/lateral lean;Sit to/from stand       Functional mobility during ADLs: Min guard;Rolling walker;Cueing for safety General ADL Comments: Pt performing ADL tasks with minguardA overall. OTR using gestures as pt has difficulty hearing. Overall good safety awareness.     Vision       Perception     Praxis      Cognition Arousal/Alertness: Awake/alert Behavior During Therapy: Flat affect Overall Cognitive Status: Difficult to assess                                 General Comments: Pt follows simple 1-2 step commands easily and requires cueing as pt's hearing is poor        Exercises     Shoulder Instructions       General Comments      Pertinent Vitals/ Pain       Pain Assessment: No/denies pain  Home Living  Prior Functioning/Environment              Frequency  Min 2X/week        Progress Toward Goals  OT Goals(current goals can now be found in the care plan section)  Progress towards OT goals: Progressing toward goals  Acute Rehab OT Goals Patient Stated Goal: get stronger OT Goal Formulation: With patient Time For Goal Achievement: 07/25/20 Potential to Achieve Goals: Good ADL Goals Pt Will Perform Grooming: with supervision;sitting;standing Pt Will Perform Lower Body Bathing: with supervision;sit to/from stand Pt Will Perform Lower Body Dressing: with supervision;sit to/from  stand Pt Will Transfer to Toilet: with supervision;ambulating Pt Will Perform Toileting - Clothing Manipulation and hygiene: with supervision;sit to/from stand  Plan Discharge plan remains appropriate    Co-evaluation                 AM-PAC OT "6 Clicks" Daily Activity     Outcome Measure   Help from another person eating meals?: None Help from another person taking care of personal grooming?: A Little Help from another person toileting, which includes using toliet, bedpan, or urinal?: A Little Help from another person bathing (including washing, rinsing, drying)?: A Little Help from another person to put on and taking off regular upper body clothing?: None Help from another person to put on and taking off regular lower body clothing?: A Little 6 Click Score: 20    End of Session Equipment Utilized During Treatment: Gait belt;Rolling walker  OT Visit Diagnosis: Muscle weakness (generalized) (M62.81);Unsteadiness on feet (R26.81)   Activity Tolerance Patient tolerated treatment well   Patient Left in bed;with call bell/phone within reach   Nurse Communication Mobility status        Time: 1117-3567 OT Time Calculation (min): 14 min  Charges: OT General Charges $OT Visit: 1 Visit OT Treatments $Self Care/Home Management : 8-22 mins  Jefferey Pica, OTR/L Acute Rehabilitation Services Pager: [phone] Office: [phone]    Paulena Servais C 07/17/2020, 11:05 AM

## 2020-07-17 NOTE — TOC Transition Note (Signed)
Transition of Care [facility]) - CM/SW Discharge Note   Patient Details  Name: [name] MRN: [id number] Date of Birth: [birth year]  [facility] CM/SW Contact:  Alexander Mt, LCSW Phone Number: 07/17/2020, 9:49 AM   Clinical Narrative:    Spoke with pt daughter [name] via telephone. Introduced self, role, reason for call. Pt aware pt cleared by MD for discharge to SNF. [place] ready and room ready (1008P). PTAR scheduled, DNR signed on chart. No controlled medications noted at this time.    Final next level of care: Skilled Nursing Facility Barriers to Discharge: Barriers Resolved   Patient Goals and CMS Choice Patient states their goals for this hospitalization and ongoing recovery are:: get to rehab, get stronger to return home CMS Medicare.gov Compare Post Acute Care list provided to:: Patient Represenative (must comment) (pt daughters) Choice offered to / list presented to : Adult Children  Discharge Placement   Existing PASRR number confirmed : 07/13/20          Patient [facility] at: [facility] Patient to be transferred to facility by: [place] Name of family member notified: pt daughter [name] via telephone Patient and family notified of of transfer: 07/17/20  Discharge Plan and Services Discharge Planning Services: CM Consult Post Acute Care Choice: Home Health          DME Arranged: N/A DME Agency: NA HH Arranged: PT, OT [place] Agency: Other - See comment ([place]) Date [place]: 07/12/20 Time [place]: 1115 Representative spoke with at [place]: Hoyle Sauer   Readmission Risk Interventions No flowsheet data found.

## 2020-07-17 NOTE — Progress Notes (Signed)
Report given to RN at [facility], patient transported by [facility].

## 2020-07-17 NOTE — Social Work (Signed)
Clinical Social Worker facilitated patient discharge including contacting patient family and facility to confirm patient discharge plans.  Clinical information faxed to facility and family agreeable with plan.  CSW arranged ambulance transport via [place] to [place]  RN to call [phone]  with report prior to discharge.  Clinical Social Worker will sign off for now as social work intervention is no longer needed. Please consult [place] again if new need arises.  Westley Hummer, MSW, LCSW Clinical Social Worker

## 2020-07-17 NOTE — Discharge Instructions (Signed)

## 2020-07-17 NOTE — Care Management Important Message (Signed)
Important Message  Patient Details  Name: [name] MRN: [id number] Date of Birth: 14-Oct-1929   Medicare Important Message Given:  Yes     Ahman Dugdale Montine Circle 07/17/2020, 3:42 PM

## 2020-07-18 DIAGNOSIS — G8929 Other chronic pain: Secondary | ICD-10-CM | POA: Diagnosis not present

## 2020-07-18 DIAGNOSIS — N200 Calculus of kidney: Secondary | ICD-10-CM | POA: Diagnosis not present

## 2020-07-18 DIAGNOSIS — R1084 Generalized abdominal pain: Secondary | ICD-10-CM | POA: Diagnosis not present

## 2020-07-18 DIAGNOSIS — K5901 Slow transit constipation: Secondary | ICD-10-CM | POA: Diagnosis not present

## 2020-07-20 DIAGNOSIS — I480 Paroxysmal atrial fibrillation: Secondary | ICD-10-CM | POA: Diagnosis not present

## 2020-07-20 DIAGNOSIS — R4189 Other symptoms and signs involving cognitive functions and awareness: Secondary | ICD-10-CM | POA: Diagnosis not present

## 2020-07-20 DIAGNOSIS — R627 Adult failure to thrive: Secondary | ICD-10-CM | POA: Diagnosis not present

## 2020-07-20 DIAGNOSIS — I951 Orthostatic hypotension: Secondary | ICD-10-CM | POA: Diagnosis not present

## 2020-07-20 DIAGNOSIS — M79661 Pain in right lower leg: Secondary | ICD-10-CM | POA: Diagnosis not present

## 2020-07-20 DIAGNOSIS — R1084 Generalized abdominal pain: Secondary | ICD-10-CM | POA: Diagnosis not present

## 2020-07-20 DIAGNOSIS — M25571 Pain in right ankle and joints of right foot: Secondary | ICD-10-CM | POA: Diagnosis not present

## 2020-07-21 DIAGNOSIS — R4189 Other symptoms and signs involving cognitive functions and awareness: Secondary | ICD-10-CM | POA: Diagnosis not present

## 2020-07-21 DIAGNOSIS — M79671 Pain in right foot: Secondary | ICD-10-CM | POA: Diagnosis not present

## 2020-07-25 DIAGNOSIS — H9212 Otorrhea, left ear: Secondary | ICD-10-CM | POA: Diagnosis not present

## 2020-07-26 DIAGNOSIS — N200 Calculus of kidney: Secondary | ICD-10-CM | POA: Diagnosis not present

## 2020-07-27 ENCOUNTER — Telehealth: Payer: Self-pay | Admitting: Cardiovascular Disease

## 2020-07-27 ENCOUNTER — Encounter (INDEPENDENT_AMBULATORY_CARE_PROVIDER_SITE_OTHER): Payer: Self-pay

## 2020-07-27 ENCOUNTER — Encounter (INDEPENDENT_AMBULATORY_CARE_PROVIDER_SITE_OTHER): Payer: Medicare Other | Admitting: Ophthalmology

## 2020-07-27 NOTE — Telephone Encounter (Signed)
New Message  Patient's daughter is calling in to inform Dr. Claiborne Billings and his nurse that patient is in the nursing home called [facility] and that is why appointment was cancelled. States she is unsure how long he will be there.

## 2020-07-27 NOTE — Telephone Encounter (Signed)
noted 

## 2020-07-27 NOTE — Telephone Encounter (Signed)
Attempted to call the pts daughter back but kept getting a fast busy signal and then all circuits are busy... will go ahead and forward to Dr. Claiborne Billings and his nurse.   It appears all future appts have been cancelled.

## 2020-07-28 ENCOUNTER — Ambulatory Visit: Payer: Medicare Other | Admitting: Cardiovascular Disease

## 2020-07-28 ENCOUNTER — Telehealth: Payer: Self-pay | Admitting: *Deleted

## 2020-07-28 NOTE — Telephone Encounter (Signed)
-----   Message from Blane Ohara McDiarmid, MD sent at 07/27/2020  2:02 PM EDT ----- Regarding: FW: New patient referred to you by Dr. Viann Fish, Could you contact patient's dgt to see if she would be willing to have her father be seen in [facility]?  I could take him on my panel after that consult, if they like.  Thank you, Sherren Mocha ----- Message ----- From: Elayne Guerin Sent: 07/11/2020   2:14 PM EDT To: Blane Ohara McDiarmid, MD Subject: New patient referred to you by Dr. Anderson Malta #  Hey Dr. McDiarmid,   This patient was referred to you by Dr. Karmen Bongo at the hospital. His daughter, [name] has called wanting to set up an appointment with you. Please let me know if you want me to set him up with you.  Contact person: [name] (daughter) Phone number: [phone]   Thanks,  Jonna Coup

## 2020-07-28 NOTE — Telephone Encounter (Signed)
LM for daughter [name] to call back.  We have 2 open dates for geri clinic, either 8-5 or 8-26.  Informed daughter that 8-26 may be better so we wouldn't have to rush and get his information by next week.  Will wait to hear back from daughter before scheduling him.  Niara Bunker,CMA

## 2020-07-28 NOTE — Telephone Encounter (Signed)
ok 

## 2020-07-31 ENCOUNTER — Other Ambulatory Visit: Payer: Self-pay | Admitting: *Deleted

## 2020-07-31 DIAGNOSIS — F329 Major depressive disorder, single episode, unspecified: Secondary | ICD-10-CM | POA: Diagnosis not present

## 2020-07-31 DIAGNOSIS — F4321 Adjustment disorder with depressed mood: Secondary | ICD-10-CM | POA: Diagnosis not present

## 2020-07-31 DIAGNOSIS — R4189 Other symptoms and signs involving cognitive functions and awareness: Secondary | ICD-10-CM | POA: Diagnosis not present

## 2020-07-31 NOTE — Patient Outreach (Signed)
Member screened for potential [facility] Care Management needs as a benefit of [place] Medicare.  Mr. Vonderhaar is receiving skilled therapy at [facility].  Facility SW reports daughter [name] stated initial transition plan was to return home with daughter. However, transition plan may change pending member's progress. Family care plan meeting scheduled for this week.   Will continue to collaborate with SNF SW and follow for potential [facility] Care Management needs. Will plan outreach as appropriate.    Marthenia Rolling, MSN-Ed, RN,BSN [place] Acute Care Coordinator [phone] [facility]) [phone]  (Toll free office)

## 2020-08-08 ENCOUNTER — Other Ambulatory Visit: Payer: Self-pay | Admitting: *Deleted

## 2020-08-08 DIAGNOSIS — R1084 Generalized abdominal pain: Secondary | ICD-10-CM | POA: Diagnosis not present

## 2020-08-08 DIAGNOSIS — I480 Paroxysmal atrial fibrillation: Secondary | ICD-10-CM | POA: Diagnosis not present

## 2020-08-08 DIAGNOSIS — J984 Other disorders of lung: Secondary | ICD-10-CM | POA: Diagnosis not present

## 2020-08-08 DIAGNOSIS — K5901 Slow transit constipation: Secondary | ICD-10-CM | POA: Diagnosis not present

## 2020-08-08 DIAGNOSIS — R627 Adult failure to thrive: Secondary | ICD-10-CM | POA: Diagnosis not present

## 2020-08-08 DIAGNOSIS — D538 Other specified nutritional anemias: Secondary | ICD-10-CM | POA: Diagnosis not present

## 2020-08-08 DIAGNOSIS — G8929 Other chronic pain: Secondary | ICD-10-CM | POA: Diagnosis not present

## 2020-08-08 NOTE — Patient Outreach (Signed)
[facility] Post- Acute Care Coordinator follow up. Member screened for potential [facility] Care Management needs as a benefit of [place] Medicare.  Member remains in [facility] for skilled therapy. Collaboration with SNF SW who indicates member's family is interested in long term care under Medicaid. Medicaid is pending.   Unless transition plans change, no current [facility] Care Management needs identified.  Marthenia Rolling, MSN-Ed, RN,BSN [place] Acute Care Coordinator [phone] [facility]) [phone]  (Toll free office)

## 2020-08-10 DIAGNOSIS — W1789XA Other fall from one level to another, initial encounter: Secondary | ICD-10-CM | POA: Diagnosis not present

## 2020-08-10 DIAGNOSIS — R4189 Other symptoms and signs involving cognitive functions and awareness: Secondary | ICD-10-CM | POA: Diagnosis not present

## 2020-08-10 DIAGNOSIS — I951 Orthostatic hypotension: Secondary | ICD-10-CM | POA: Diagnosis not present

## 2020-08-13 ENCOUNTER — Other Ambulatory Visit: Payer: Self-pay

## 2020-08-13 ENCOUNTER — Emergency Department (HOSPITAL_COMMUNITY)
Admission: EM | Admit: 2020-08-13 | Discharge: 2020-08-13 | Disposition: A | Discharge status: Home / Self Care | Payer: Medicare Other | Attending: Emergency Medicine | Admitting: Emergency Medicine

## 2020-08-13 ENCOUNTER — Emergency Department (HOSPITAL_COMMUNITY): Payer: Medicare Other

## 2020-08-13 DIAGNOSIS — Z7901 Long term (current) use of anticoagulants: Secondary | ICD-10-CM | POA: Diagnosis not present

## 2020-08-13 DIAGNOSIS — Z87891 Personal history of nicotine dependence: Secondary | ICD-10-CM | POA: Insufficient documentation

## 2020-08-13 DIAGNOSIS — R9431 Abnormal electrocardiogram [ECG] [EKG]: Secondary | ICD-10-CM | POA: Diagnosis not present

## 2020-08-13 DIAGNOSIS — J449 Chronic obstructive pulmonary disease, unspecified: Secondary | ICD-10-CM | POA: Diagnosis not present

## 2020-08-13 DIAGNOSIS — W19XXXA Unspecified fall, initial encounter: Secondary | ICD-10-CM

## 2020-08-13 DIAGNOSIS — I672 Cerebral atherosclerosis: Secondary | ICD-10-CM | POA: Diagnosis not present

## 2020-08-13 DIAGNOSIS — Y999 Unspecified external cause status: Secondary | ICD-10-CM | POA: Insufficient documentation

## 2020-08-13 DIAGNOSIS — M79601 Pain in right arm: Secondary | ICD-10-CM | POA: Diagnosis not present

## 2020-08-13 DIAGNOSIS — Y939 Activity, unspecified: Secondary | ICD-10-CM | POA: Insufficient documentation

## 2020-08-13 DIAGNOSIS — I2510 Atherosclerotic heart disease of native coronary artery without angina pectoris: Secondary | ICD-10-CM | POA: Diagnosis not present

## 2020-08-13 DIAGNOSIS — W1839XA Other fall on same level, initial encounter: Secondary | ICD-10-CM | POA: Insufficient documentation

## 2020-08-13 DIAGNOSIS — R531 Weakness: Secondary | ICD-10-CM

## 2020-08-13 DIAGNOSIS — R102 Pelvic and perineal pain: Secondary | ICD-10-CM | POA: Diagnosis not present

## 2020-08-13 DIAGNOSIS — M79631 Pain in right forearm: Secondary | ICD-10-CM | POA: Diagnosis not present

## 2020-08-13 DIAGNOSIS — S0990XA Unspecified injury of head, initial encounter: Secondary | ICD-10-CM | POA: Diagnosis not present

## 2020-08-13 DIAGNOSIS — Y929 Unspecified place or not applicable: Secondary | ICD-10-CM | POA: Insufficient documentation

## 2020-08-13 DIAGNOSIS — S199XXA Unspecified injury of neck, initial encounter: Secondary | ICD-10-CM | POA: Diagnosis not present

## 2020-08-13 NOTE — ED Notes (Signed)
Update provided to family [name])

## 2020-08-13 NOTE — ED Notes (Signed)
Patient transported to X-ray 

## 2020-08-13 NOTE — ED Triage Notes (Addendum)
Pt presents from [facility] via EMS for unwitnessed fall on eliquis (not leveled per charge), originally complained of R arm pain (appears to have landed on R arm) but is now pain free. H/o recent falls, at baseline cognitive per facility notes, moves R arm at this time. No obvious pain or injury to head, neck, back, otherwise. Uncooperative with staff and EMS. Refuses to answer orientation questions to RN but does reply that he is not in pain. Hard of hearing.

## 2020-08-13 NOTE — ED Notes (Signed)
Attempted report to [facility], unable to give

## 2020-08-13 NOTE — Discharge Instructions (Addendum)
[name] was seen in the ED today after a fall.  CT of his head and neck were normal as well as normal Xray of his R hip and R arm.  Feel that he is stable for discharge back to his facility today.

## 2020-08-13 NOTE — ED Notes (Signed)
Pt back from CT

## 2020-08-13 NOTE — ED Provider Notes (Signed)
[facility] EMERGENCY DEPARTMENT Provider Note   CSN: [id number] Arrival date & time: 08/13/20  1429     History Chief Complaint  Patient presents with  . Fall    [name] is a 86 y.o. male.   Fall This is a new problem. The current episode started less than 1 hour ago. The problem occurs rarely. The problem has not changed since onset.Nothing aggravates the symptoms. Nothing relieves the symptoms. He has tried nothing for the symptoms. The treatment provided no relief.       Past Medical History:  Diagnosis Date  . ACS (acute coronary syndrome) ([place])   . Acute respiratory failure with hypoxia ([place]) 05/2017  . Anemia of renal disease 12/05/2011  . Anemia, iron deficiency 12/05/2011  . Asthma   . CAS (cerebral atherosclerosis)    Carotid Dopplers, 04/09/2012 - Bilateral Bulb/Proximal ICAs-mild to moderate amount of fibrous plaque of fibrous soft plaque w/o evidence of a significant diameter reduction, dissection, or any other vascular abnormality  . COPD (chronic obstructive pulmonary disease) ([place])   . Coronary heart disease    a. s/p CABG in 2006 with LIMA-LAD, SVG-Cx, SVG-IM-OM b. low-risk NST in 08/2016  . Dysrhythmia 11/09/2013   SVT VS A FLUTTER   . Kidney stones   . MGUS (monoclonal gammopathy of unknown significance) 12/05/2011  . Orthostatic hypotension 09/10/2016  . PAF (paroxysmal atrial fibrillation) (HCC)    a. on Eliquis  . Shortness of breath   . SSS (sick sinus syndrome) ([place])    a. s/p PPM placement in 2015    Patient Active Problem List   Diagnosis Date Noted  . Generalized weakness   . Goals of care, counseling/discussion   . Palliative care by specialist   . Abdominal pain 07/12/2020  . RLQ abdominal pain 07/11/2020  . Gallbladder polyp 07/11/2020  . Failure to thrive in adult 07/11/2020  . Exudative age-related macular degeneration of right eye with active choroidal neovascularization ([place]) 05/18/2020  . Retinal  hemorrhage, right 05/18/2020  . 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 Acute on chronic right-sided heart failure ([place])   . Pulmonary hypertension ([place])   . Anemia of chronic disease 06/15/2017  . CKD (chronic kidney disease) stage 3, GFR 30-59 ml/min 06/15/2017  . Acute coronary syndromes ([place]) 06/15/2017  . Calculus of gallbladder without cholecystitis without obstruction 02/13/2017  . CAD (coronary artery disease) 01/23/2017  . Sepsis secondary to UTI ([place])   . Bacteremia due to Enterobacter species   . Monoclonal gammopathy of unknown significance (MGUS)   . Acute renal failure with acute tubular necrosis superimposed on chronic kidney disease ([place])   . Other hyperlipidemia   . UTI (urinary tract infection) 12/01/2016  . Lactic acidosis 12/01/2016  . Acute respiratory failure with hypoxia ([place]) 12/01/2016  . Acute-on-chronic kidney injury ([place]) 12/01/2016  . Sepsis ([place]) 12/01/2016  . Orthostatic hypotension 09/10/2016  . Chronic anticoagulation 04/23/2016  . Hyperlipidemia 05/09/2014  . AVB (atrioventricular block)- high grade second degree 02/05/2014  . Pacemaker- MDT 02/04/14 02/04/2014  . 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 Nonspecific ST-T wave electrocardiographic changes   . COPD (chronic obstructive pulmonary disease) ([place])   . Paroxysmal atrial flutter ([place]) 08/15/2013  . Syncope 06/24/2013  . MGUS (monoclonal gammopathy of unknown significance) 12/05/2011  . Anemia of renal disease 12/05/2011  . Anemia, iron deficiency 12/05/2011  . ALLERGIC RHINITIS 04/06/2008  . EMPHYSEMA 03/11/2008    Past  Surgical History:  Procedure Laterality Date  . CARDIAC CATHETERIZATION  12/05/2005   Recommended CABG  . CARDIOVERSION N/A 11/09/2013   Procedure: CARDIOVERSION;  Surgeon: Pixie Casino, MD;  Location[facility] ENDOSCOPY;  Service: Cardiovascular;  Laterality: N/A;  . CATARACT EXTRACTION    . CORONARY ARTERY  BYPASS GRAFT  12/06/2005   x4, LIMA to LAD, vein to distal circumflex, sequential vein to intermediate and obtuse marginal vessel  . ESOPHAGOGASTRODUODENOSCOPY  07/14/2020  . ESOPHAGOGASTRODUODENOSCOPY (EGD) WITH PROPOFOL N/A 07/14/2020   Procedure: ESOPHAGOGASTRODUODENOSCOPY (EGD) WITH PROPOFOL;  Surgeon: Gatha Mayer, MD;  Location: [facility];  Service: Endoscopy;  Laterality: N/A;  . INNER EAR SURGERY    . KIDNEY STONE SURGERY    . LOOP RECORDER IMPLANT N/A 11/30/2013   Procedure: LOOP RECORDER IMPLANT;  Surgeon: Sanda Klein, MD;  Location: [facility] CATH LAB;  Service: Cardiovascular;  Laterality: N/A;  . PERMANENT PACEMAKER INSERTION N/A 02/04/2014   Procedure: PERMANENT PACEMAKER INSERTION;  Surgeon: Sanda Klein, MD;  Location: [facility] CATH LAB;  Service: Cardiovascular;  Laterality: N/A;  . TEE WITHOUT CARDIOVERSION N/A 11/09/2013   Procedure: TRANSESOPHAGEAL ECHOCARDIOGRAM (TEE);  Surgeon: Pixie Casino, MD;  Location[facility] ENDOSCOPY;  Service: Cardiovascular;  Laterality: N/A;       Family History  Problem Relation Age of Onset  . Tuberculosis Father   . Heart attack Father   . Heart disease Mother   . Heart attack Mother   . Heart disease Brother   . Emphysema Daughter        premature without tobacco exposure    Social History   Tobacco Use  . Smoking status: Former Smoker    Packs/day: 1.00    Years: 47.00    Pack years: 47.00    Types: Cigarettes    Start date: 12/02/1951    Quit date: 12/30/1993    Years since quitting: 26.6  . Smokeless tobacco: Never Used  . Tobacco comment: quit 20 years ago  Vaping Use  . Vaping Use: Never used  Substance Use Topics  . Alcohol use: No    Alcohol/week: 0.0 standard drinks  . Drug use: No    Home Medications Prior to Admission medications   Medication Sig Start Date End Date Taking? Authorizing Provider  acetaminophen (TYLENOL) 650 MG CR tablet Take 1 tablet (650 mg total) by mouth every 8 (eight) hours as needed for pain.  07/17/20   Terrilee Croak, MD  apixaban (ELIQUIS) 2.5 MG TABS tablet Take 1 tablet (2.5 mg total) by mouth 2 (two) times daily. 06/09/20   Troy Sine, MD  atorvastatin (LIPITOR) 40 MG tablet TAKE 1 TABLET (40 MG TOTAL) BY MOUTH DAILY. KEEP OV. Patient taking differently: Take 40 mg by mouth daily.  12/06/19   Troy Sine, MD  bisacodyl (BISACODYL) 5 MG EC tablet Take 5 mg by mouth as needed for mild constipation or moderate constipation.    [provider]  Chloramphenicol Palmitate POWD Place 3 puffs into both ears daily.     [provider]  cycloSPORINE (RESTASIS) 0.05 % ophthalmic emulsion Place 1 drop into both eyes 2 (two) times daily.  01/21/20   [provider]  digoxin 62.5 MCG TABS Take 0.0625 mg by mouth daily. 07/17/20   Dahal, Marlowe Aschoff, MD  diphenhydramine-acetaminophen (TYLENOL PM) 25-500 MG TABS Take 2 tablets by mouth at bedtime.     [provider]  ENSURE (ENSURE) Take 237 mLs by mouth 2 (two) times daily between meals.  [provider]  famotidine (PEPCID) 20 MG tablet Take 1 tablet (20 mg total) by mouth 2 (two) times daily. 07/17/20   Terrilee Croak, MD  Methylcellulose, Laxative, (CITRUCEL) 500 MG TABS Take 1,000 mg by mouth at bedtime.    [provider]  metoprolol tartrate (LOPRESSOR) 50 MG tablet TAKE 1.5 TABLETS (75 MG TOTAL) BY MOUTH 2 (TWO) TIMES DAILY. 07/06/20   Troy Sine, MD  midodrine (PROAMATINE) 2.5 MG tablet Take 1 tablet (2.5 mg total) by mouth 2 (two) times daily with a meal. 12/07/19   Troy Sine, MD  Multiple Vitamin (MULTIVITAMIN WITH MINERALS) TABS tablet Take 1 tablet by mouth daily. 07/17/20   Dahal, Marlowe Aschoff, MD  niacin (NIASPAN) 500 MG CR tablet TAKE 1 TABLET BY MOUTH AT BEDTIME. MUST MAKE OFFICE APPOINTMENT Patient taking differently: Take 500 mg by mouth at bedtime.  05/10/20   Troy Sine, MD  polyethylene glycol (MIRALAX / GLYCOLAX) 17 g packet Take 17 g by mouth 2 (two) times daily.  07/17/20   Dahal, Marlowe Aschoff, MD  PROAIR HFA 108 (90 Base) MCG/ACT inhaler INHALE 2 PUFFS INTO THE  LUNGS EVERY 4 HOURS AS  NEEDED FOR WHEEZING OR  SHORTNESS OF BREATH Patient taking differently: Inhale 2 puffs into the lungs every 4 (four) hours as needed for wheezing or shortness of breath.  04/20/18   Collene Gobble, MD  Probiotic Product ([employer] SENIOR PROBIOTIC PO) Take 1 capsule by mouth at bedtime. Once daily    [provider]  revefenacin (YUPELRI) 175 MCG/3ML nebulizer solution Inhale 3 mLs (175 mcg total) into the lungs daily. 04/06/20   Collene Gobble, MD  senna-docusate (SENOKOT-S) 8.6-50 MG tablet Take 1 tablet by mouth 2 (two) times daily. 07/17/20   Terrilee Croak, MD  sertraline (ZOLOFT) 25 MG tablet Take 25 mg by mouth daily. 06/29/20   [provider]  SIMBRINZA 1-0.2 % SUSP Place 1 drop into both eyes 2 (two) times daily.  10/12/19   [provider]  Tamsulosin HCl (FLOMAX) 0.4 MG CAPS Take 0.4 mg by mouth at bedtime.     [provider]    Allergies    Patient has no known allergies.  Review of Systems   Review of Systems  Unable to perform ROS: Other    Physical Exam Updated Vital Signs BP 122/65   Pulse 100   Temp 97.8 F (36.6 C)   Resp 18   SpO2 94%   Physical Exam Vitals and nursing note reviewed.  Constitutional:      Appearance: He is well-developed.     Comments: Pt in C collar  HENT:     Head: Normocephalic and atraumatic.  Eyes:     Conjunctiva/sclera: Conjunctivae normal.  Cardiovascular:     Rate and Rhythm: Normal rate and regular rhythm.     Heart sounds: No murmur heard.   Pulmonary:     Effort: Pulmonary effort is normal. No respiratory distress.     Breath sounds: Normal breath sounds.  Abdominal:     Palpations: Abdomen is soft.     Tenderness: There is no abdominal tenderness. There is no guarding or rebound.  Musculoskeletal:        General: No swelling, tenderness, deformity or signs of injury.      Cervical back: Neck supple.     Right lower leg: No edema.     Left lower leg: No edema.  Skin:    General: Skin is warm and dry.  Findings: No bruising, lesion or rash.  Neurological:     Mental Status: He is alert.  Psychiatric:        Mood and Affect: Mood normal.        Behavior: Behavior normal.     ED Results / Procedures / Treatments   Labs (all labs ordered are listed, but only abnormal results are displayed) Labs Reviewed  BASIC METABOLIC PANEL  CBC    EKG EKG Interpretation  Date/Time:  Sunday August 13 2020 15:41:17 EDT Ventricular Rate:  77 PR Interval:  216 QRS Duration: 130 QT Interval:  384 QTC Calculation: 434 R Axis:   41 Text Interpretation: Sinus rhythm with sinus arrhythmia with 1st degree A-V block Right bundle branch block T wave abnormality, consider inferior ischemia Abnormal ECG No significant change since last tracing Confirmed by Gareth Morgan [phone]) on 08/13/2020 3:53:10 PM   Radiology DG Forearm Right  Result Date: 08/13/2020 CLINICAL DATA:  Fall with pain EXAM: RIGHT FOREARM - 2 VIEW COMPARISON:  None. FINDINGS: There is no evidence of fracture or other focal bone lesions. There is diffuse osseous demineralization. Soft tissues are unremarkable. IMPRESSION: Negative. Electronically Signed   By: Zerita Boers M.D.   On: 08/13/2020 16:35   CT Head Wo Contrast  Result Date: 08/13/2020 CLINICAL DATA:  Head trauma EXAM: CT HEAD WITHOUT CONTRAST TECHNIQUE: Contiguous axial images were obtained from the base of the skull through the vertex without intravenous contrast. COMPARISON:  None. FINDINGS: Brain: No evidence of acute territorial infarction, hemorrhage, hydrocephalus,extra-axial collection or mass lesion/mass effect. There is dilatation the ventricles and sulci consistent with age-related atrophy. Low-attenuation changes in the deep white matter consistent with small vessel ischemia. Vascular: No hyperdense vessel or unexpected  calcification. Skull: The skull is intact. No fracture or focal lesion identified. Sinuses/Orbits: The visualized paranasal sinuses and mastoid air cells are clear. The orbits and globes intact. Other: None Cervical spine: Alignment: Physiologic Skull base and vertebrae: Visualized skull base is intact. No atlanto-occipital dissociation. The vertebral body heights are well maintained. No fracture or pathologic osseous lesion seen. Soft tissues and spinal canal: The visualized paraspinal soft tissues are unremarkable. No prevertebral soft tissue swelling is seen. The spinal canal is grossly unremarkable, no large epidural collection or significant canal narrowing. Disc levels: Multilevel cervical spine spondylosis is seen with disc osteophyte complex and uncovertebral osteophytes most notable at C3-C4 and C4-C5 with a central disc protrusion and moderate neural foraminal and central canal stenosis. Upper chest: Biapical scarring and pleural calcifications are seen. Centrilobular emphysematous changes are noted. Thoracic inlet is within normal limits. Other: None IMPRESSION: No acute intracranial abnormality. Findings consistent with age related atrophy and chronic small vessel ischemia No acute fracture or malalignment of the spine. Emphysema (ICD10-J43.9). Electronically Signed   By: Prudencio Pair M.D.   On: 08/13/2020 17:12   CT Cervical Spine Wo Contrast  Result Date: 08/13/2020 CLINICAL DATA:  Head trauma EXAM: CT HEAD WITHOUT CONTRAST TECHNIQUE: Contiguous axial images were obtained from the base of the skull through the vertex without intravenous contrast. COMPARISON:  None. FINDINGS: Brain: No evidence of acute territorial infarction, hemorrhage, hydrocephalus,extra-axial collection or mass lesion/mass effect. There is dilatation the ventricles and sulci consistent with age-related atrophy. Low-attenuation changes in the deep white matter consistent with small vessel ischemia. Vascular: No hyperdense vessel  or unexpected calcification. Skull: The skull is intact. No fracture or focal lesion identified. Sinuses/Orbits: The visualized paranasal sinuses and mastoid air cells are clear. The orbits and  globes intact. Other: None Cervical spine: Alignment: Physiologic Skull base and vertebrae: Visualized skull base is intact. No atlanto-occipital dissociation. The vertebral body heights are well maintained. No fracture or pathologic osseous lesion seen. Soft tissues and spinal canal: The visualized paraspinal soft tissues are unremarkable. No prevertebral soft tissue swelling is seen. The spinal canal is grossly unremarkable, no large epidural collection or significant canal narrowing. Disc levels: Multilevel cervical spine spondylosis is seen with disc osteophyte complex and uncovertebral osteophytes most notable at C3-C4 and C4-C5 with a central disc protrusion and moderate neural foraminal and central canal stenosis. Upper chest: Biapical scarring and pleural calcifications are seen. Centrilobular emphysematous changes are noted. Thoracic inlet is within normal limits. Other: None IMPRESSION: No acute intracranial abnormality. Findings consistent with age related atrophy and chronic small vessel ischemia No acute fracture or malalignment of the spine. Emphysema (ICD10-J43.9). Electronically Signed   By: Prudencio Pair M.D.   On: 08/13/2020 17:12   DG Pelvis Portable  Result Date: 08/13/2020 CLINICAL DATA:  Fall with pain EXAM: PORTABLE PELVIS 1-2 VIEWS COMPARISON:  CT abdomen pelvis dated 02/20/2018. FINDINGS: There is no evidence of pelvic fracture or diastasis. Degenerative changes are seen in the hips and lumbar spine. IMPRESSION: No acute findings. Electronically Signed   By: Zerita Boers M.D.   On: 08/13/2020 16:37    Procedures Procedures (including critical care time)  Medications Ordered in ED Medications - No data to display  ED Course  I have reviewed the triage vital signs and the nursing  notes.  Pertinent labs & imaging results that were available during my care of the patient were reviewed by me and considered in my medical decision making (see chart for details).    MDM Rules/Calculators/A&P                          86 year old male presents as an unwitnessed fall on Eliquis.  Patient had unwitnessed fall at his care facility on Eliquis and was transported to [facility] for further evaluation.  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  Report from EMS is that patient landed on his right arm and was originally complaining of right arm pain though denied any pain with EMS.  Afebrile vital signs stable.  Exam as above.  No focal signs of trauma on physical exam.  Will obtain CT head as well as CT C-spine and x-rays of the right arm and hip to evaluate for injury.    Upon reevaluation patient found his hearing impaired however is still unable to fully participate in interview or physical exam.  Upon chart review from paper sent from patient's facility it appears that patient does not typically cooperate with history and physical exam which appears to be his baseline and consistent with today's physical exam.  CT of his head and C-spine were negative for acute fractures or intracranial abnormalities.  X-ray of the right arm and the right hip again negative for acute fractures.  C-collar was removed and patient continued to deny any specific complaints.  EKG consistent with patient's previous EKGs without signs of acute ischemia.    At this time feel the patient is stable for discharge back to his facility.  Do not feel the patient requires further imaging or labs at this time given his baseline per previous notes and no signs of trauma on physical exam.  Patient was discharged in  stable condition without further events.  Final Clinical Impression(s) / ED Diagnoses Final diagnoses:  Fall, initial encounter    Rx / DC  Orders ED Discharge Orders    None       Silvestre Gunner, MD 08/13/20 [id number]    Gareth Morgan, MD 08/15/20 [id number]

## 2020-08-14 ENCOUNTER — Other Ambulatory Visit: Payer: Self-pay | Admitting: *Deleted

## 2020-08-14 DIAGNOSIS — G8929 Other chronic pain: Secondary | ICD-10-CM | POA: Diagnosis not present

## 2020-08-14 DIAGNOSIS — W1789XA Other fall from one level to another, initial encounter: Secondary | ICD-10-CM | POA: Diagnosis not present

## 2020-08-14 DIAGNOSIS — S22000D Wedge compression fracture of unspecified thoracic vertebra, subsequent encounter for fracture with routine healing: Secondary | ICD-10-CM | POA: Diagnosis not present

## 2020-08-14 NOTE — Patient Outreach (Signed)
[facility]N Post- Acute Care Coordinator follow up. Member screened for potential [facility] Care Management needs as a benefit of [place] Medicare.  Mr. Wandler is receiving skilled therapy at [facility]. Mr. Panuco recently returned to SNF from hospital ED. Facility SW states transition plan remains to be LTC at the facility.  Marthenia Rolling, MSN-Ed, RN,BSN [place] Acute Care Coordinator [phone] [facility]) [phone]  (Toll free office)

## 2020-08-21 ENCOUNTER — Other Ambulatory Visit: Payer: Self-pay | Admitting: *Deleted

## 2020-08-21 NOTE — Patient Outreach (Addendum)
THN Post- Acute Care Coordinator follow up. Member screened for potential [facility] Care Management needs as a benefit of [place] Medicare.  Communication sent to [facility] to request update on transition plan/disposition.    Marthenia Rolling, MSN-Ed, RN,BSN [place] Acute Care Coordinator [phone] [facility]) [phone]  (Toll free office)

## 2020-08-21 NOTE — Patient Outreach (Signed)
THN Post- Acute Care Coordinator follow up.   Update received from [facility]. Member will transition to long term care under Medicaid pending tomorrow 08/22/20.   No identifiable [facility] Care Management needs.    Marthenia Rolling, MSN-Ed, RN,BSN [place] Acute Care Coordinator [phone] [facility]) [phone]  (Toll free office)

## 2020-08-22 DIAGNOSIS — G8929 Other chronic pain: Secondary | ICD-10-CM | POA: Diagnosis not present

## 2020-08-22 DIAGNOSIS — H73893 Other specified disorders of tympanic membrane, bilateral: Secondary | ICD-10-CM | POA: Diagnosis not present

## 2020-08-22 DIAGNOSIS — S4990XA Unspecified injury of shoulder and upper arm, unspecified arm, initial encounter: Secondary | ICD-10-CM | POA: Diagnosis not present

## 2020-08-22 DIAGNOSIS — S41112A Laceration without foreign body of left upper arm, initial encounter: Secondary | ICD-10-CM | POA: Diagnosis not present

## 2020-08-26 DIAGNOSIS — Z20822 Contact with and (suspected) exposure to covid-19: Secondary | ICD-10-CM | POA: Diagnosis not present

## 2020-08-28 ENCOUNTER — Ambulatory Visit: Payer: Medicare Other | Admitting: Cardiovascular Disease

## 2020-08-28 ENCOUNTER — Other Ambulatory Visit: Payer: Medicare Other

## 2020-08-28 ENCOUNTER — Inpatient Hospital Stay: Payer: Medicare Other

## 2020-08-28 ENCOUNTER — Inpatient Hospital Stay: Payer: Medicare Other | Admitting: Hematology & Oncology

## 2020-08-28 ENCOUNTER — Ambulatory Visit: Payer: Medicare Other | Admitting: Hematology & Oncology

## 2020-08-29 DIAGNOSIS — Z20822 Contact with and (suspected) exposure to covid-19: Secondary | ICD-10-CM | POA: Diagnosis not present

## 2020-08-30 ENCOUNTER — Telehealth: Payer: Self-pay | Admitting: Cardiovascular Disease

## 2020-08-30 ENCOUNTER — Telehealth: Payer: Self-pay | Admitting: Emergency Medicine

## 2020-08-30 NOTE — Telephone Encounter (Signed)
Pts daughter [name] called to see if Dr. Claiborne Billings can give them a letter for social services of [facility] that states that her sister [name] has been his medically necessary caregiver up until he has been living at [facility] since 07/14/20. They sent him there to get stronger with around the clock care but he is not doing well there.. he is refusing nutrition and therapy and will not get out of bed.   They are applying for Medicaid and wanting to bring him home in the care of [facility].   I asked them to call his PCP but she says she has not been able to get through to them... Dr. Arline Asp.   She says she will continue to try but still asks if she can have a letter from Dr. Claiborne Billings since he has been aware of this problem that has arisen just this summer.   I will forward to Dr. Claiborne Billings for his review.

## 2020-08-30 NOTE — Telephone Encounter (Signed)
The patient's daughter had done a good job taking care of her father for some time prior to his nursing home.  I did not send him to the nursing home and I am not sure who the attending physician is.  However she is capable of tending to her father's needs

## 2020-08-30 NOTE — Telephone Encounter (Signed)
New message:    Patient daughter calling to get a note concerning her father, he is now in a nursing home. Please call patient daughter. Please call patient daughter.

## 2020-08-30 NOTE — Telephone Encounter (Signed)
Patient family is requesting a letter describing [name] condition for medicaid and [name]. The daughter, [name], has lived with her father for 10 years and assisted with his care, he has now been moved to a nursing home. Can we write a letter describing his condition and care he might have needed over the last 10 years? Please advise.

## 2020-08-31 DIAGNOSIS — Z20822 Contact with and (suspected) exposure to covid-19: Secondary | ICD-10-CM | POA: Diagnosis not present

## 2020-08-31 DIAGNOSIS — R627 Adult failure to thrive: Secondary | ICD-10-CM | POA: Diagnosis not present

## 2020-08-31 DIAGNOSIS — R1084 Generalized abdominal pain: Secondary | ICD-10-CM | POA: Diagnosis not present

## 2020-08-31 DIAGNOSIS — R630 Anorexia: Secondary | ICD-10-CM | POA: Diagnosis not present

## 2020-08-31 DIAGNOSIS — R634 Abnormal weight loss: Secondary | ICD-10-CM | POA: Diagnosis not present

## 2020-09-01 ENCOUNTER — Encounter: Payer: Self-pay | Admitting: *Deleted

## 2020-09-01 NOTE — Telephone Encounter (Signed)
Spoke with pt daughter, aware of dr Evette Georges comments. Placed in letter form and placed at the front desk for pick up.

## 2020-09-01 NOTE — Telephone Encounter (Signed)
    [name] calling back to follow up

## 2020-09-05 DIAGNOSIS — R41841 Cognitive communication deficit: Secondary | ICD-10-CM | POA: Diagnosis not present

## 2020-09-05 DIAGNOSIS — R2681 Unsteadiness on feet: Secondary | ICD-10-CM | POA: Diagnosis not present

## 2020-09-05 DIAGNOSIS — R627 Adult failure to thrive: Secondary | ICD-10-CM | POA: Diagnosis not present

## 2020-09-05 DIAGNOSIS — R1312 Dysphagia, oropharyngeal phase: Secondary | ICD-10-CM | POA: Diagnosis not present

## 2020-09-05 DIAGNOSIS — I2510 Atherosclerotic heart disease of native coronary artery without angina pectoris: Secondary | ICD-10-CM | POA: Diagnosis not present

## 2020-09-05 DIAGNOSIS — H9212 Otorrhea, left ear: Secondary | ICD-10-CM | POA: Diagnosis not present

## 2020-09-05 DIAGNOSIS — R262 Difficulty in walking, not elsewhere classified: Secondary | ICD-10-CM | POA: Diagnosis not present

## 2020-09-05 DIAGNOSIS — M6259 Muscle wasting and atrophy, not elsewhere classified, multiple sites: Secondary | ICD-10-CM | POA: Diagnosis not present

## 2020-09-05 DIAGNOSIS — R2689 Other abnormalities of gait and mobility: Secondary | ICD-10-CM | POA: Diagnosis not present

## 2020-09-05 DIAGNOSIS — Z20822 Contact with and (suspected) exposure to covid-19: Secondary | ICD-10-CM | POA: Diagnosis not present

## 2020-09-05 DIAGNOSIS — M6281 Muscle weakness (generalized): Secondary | ICD-10-CM | POA: Diagnosis not present

## 2020-09-05 DIAGNOSIS — R278 Other lack of coordination: Secondary | ICD-10-CM | POA: Diagnosis not present

## 2020-09-06 DIAGNOSIS — R41841 Cognitive communication deficit: Secondary | ICD-10-CM | POA: Diagnosis not present

## 2020-09-06 DIAGNOSIS — R1312 Dysphagia, oropharyngeal phase: Secondary | ICD-10-CM | POA: Diagnosis not present

## 2020-09-06 DIAGNOSIS — R278 Other lack of coordination: Secondary | ICD-10-CM | POA: Diagnosis not present

## 2020-09-06 DIAGNOSIS — R2689 Other abnormalities of gait and mobility: Secondary | ICD-10-CM | POA: Diagnosis not present

## 2020-09-06 DIAGNOSIS — M6281 Muscle weakness (generalized): Secondary | ICD-10-CM | POA: Diagnosis not present

## 2020-09-06 DIAGNOSIS — M6259 Muscle wasting and atrophy, not elsewhere classified, multiple sites: Secondary | ICD-10-CM | POA: Diagnosis not present

## 2020-09-07 ENCOUNTER — Encounter: Payer: Self-pay | Admitting: Emergency Medicine

## 2020-09-07 DIAGNOSIS — J984 Other disorders of lung: Secondary | ICD-10-CM | POA: Diagnosis not present

## 2020-09-07 DIAGNOSIS — D472 Monoclonal gammopathy: Secondary | ICD-10-CM | POA: Diagnosis not present

## 2020-09-07 DIAGNOSIS — K769 Liver disease, unspecified: Secondary | ICD-10-CM | POA: Diagnosis not present

## 2020-09-07 DIAGNOSIS — K5901 Slow transit constipation: Secondary | ICD-10-CM | POA: Diagnosis not present

## 2020-09-07 DIAGNOSIS — R627 Adult failure to thrive: Secondary | ICD-10-CM | POA: Diagnosis not present

## 2020-09-07 DIAGNOSIS — I959 Hypotension, unspecified: Secondary | ICD-10-CM | POA: Diagnosis not present

## 2020-09-07 DIAGNOSIS — N189 Chronic kidney disease, unspecified: Secondary | ICD-10-CM | POA: Diagnosis not present

## 2020-09-07 DIAGNOSIS — G8929 Other chronic pain: Secondary | ICD-10-CM | POA: Diagnosis not present

## 2020-09-07 DIAGNOSIS — I480 Paroxysmal atrial fibrillation: Secondary | ICD-10-CM | POA: Diagnosis not present

## 2020-09-07 DIAGNOSIS — Z03818 Encounter for observation for suspected exposure to other biological agents ruled out: Secondary | ICD-10-CM | POA: Diagnosis not present

## 2020-09-07 DIAGNOSIS — H6040 Cholesteatoma of external ear, unspecified ear: Secondary | ICD-10-CM | POA: Diagnosis not present

## 2020-09-07 DIAGNOSIS — R1084 Generalized abdominal pain: Secondary | ICD-10-CM | POA: Diagnosis not present

## 2020-09-07 DIAGNOSIS — R634 Abnormal weight loss: Secondary | ICD-10-CM | POA: Diagnosis not present

## 2020-09-07 NOTE — Telephone Encounter (Signed)
Spoke with Wendie Simmer. I have written the letter but could not get it to print due software problem.

## 2020-09-07 NOTE — Telephone Encounter (Signed)
Printed and will leave at front desk for [facility] to pick up

## 2020-09-08 DIAGNOSIS — M79641 Pain in right hand: Secondary | ICD-10-CM | POA: Diagnosis not present

## 2020-09-08 DIAGNOSIS — M25531 Pain in right wrist: Secondary | ICD-10-CM | POA: Diagnosis not present

## 2020-09-11 DIAGNOSIS — M6259 Muscle wasting and atrophy, not elsewhere classified, multiple sites: Secondary | ICD-10-CM | POA: Diagnosis not present

## 2020-09-11 DIAGNOSIS — R41841 Cognitive communication deficit: Secondary | ICD-10-CM | POA: Diagnosis not present

## 2020-09-11 DIAGNOSIS — M6281 Muscle weakness (generalized): Secondary | ICD-10-CM | POA: Diagnosis not present

## 2020-09-11 DIAGNOSIS — M25531 Pain in right wrist: Secondary | ICD-10-CM | POA: Diagnosis not present

## 2020-09-11 DIAGNOSIS — R2689 Other abnormalities of gait and mobility: Secondary | ICD-10-CM | POA: Diagnosis not present

## 2020-09-11 DIAGNOSIS — R1312 Dysphagia, oropharyngeal phase: Secondary | ICD-10-CM | POA: Diagnosis not present

## 2020-09-11 DIAGNOSIS — R278 Other lack of coordination: Secondary | ICD-10-CM | POA: Diagnosis not present

## 2020-09-12 DIAGNOSIS — M6259 Muscle wasting and atrophy, not elsewhere classified, multiple sites: Secondary | ICD-10-CM | POA: Diagnosis not present

## 2020-09-12 DIAGNOSIS — R278 Other lack of coordination: Secondary | ICD-10-CM | POA: Diagnosis not present

## 2020-09-12 DIAGNOSIS — R2689 Other abnormalities of gait and mobility: Secondary | ICD-10-CM | POA: Diagnosis not present

## 2020-09-12 DIAGNOSIS — M6281 Muscle weakness (generalized): Secondary | ICD-10-CM | POA: Diagnosis not present

## 2020-09-12 DIAGNOSIS — R1312 Dysphagia, oropharyngeal phase: Secondary | ICD-10-CM | POA: Diagnosis not present

## 2020-09-12 DIAGNOSIS — R41841 Cognitive communication deficit: Secondary | ICD-10-CM | POA: Diagnosis not present

## 2020-09-12 DIAGNOSIS — Z20822 Contact with and (suspected) exposure to covid-19: Secondary | ICD-10-CM | POA: Diagnosis not present

## 2020-09-13 DIAGNOSIS — R278 Other lack of coordination: Secondary | ICD-10-CM | POA: Diagnosis not present

## 2020-09-13 DIAGNOSIS — R41841 Cognitive communication deficit: Secondary | ICD-10-CM | POA: Diagnosis not present

## 2020-09-13 DIAGNOSIS — R2689 Other abnormalities of gait and mobility: Secondary | ICD-10-CM | POA: Diagnosis not present

## 2020-09-13 DIAGNOSIS — R1312 Dysphagia, oropharyngeal phase: Secondary | ICD-10-CM | POA: Diagnosis not present

## 2020-09-13 DIAGNOSIS — M6281 Muscle weakness (generalized): Secondary | ICD-10-CM | POA: Diagnosis not present

## 2020-09-13 DIAGNOSIS — M6259 Muscle wasting and atrophy, not elsewhere classified, multiple sites: Secondary | ICD-10-CM | POA: Diagnosis not present

## 2020-09-14 DIAGNOSIS — R278 Other lack of coordination: Secondary | ICD-10-CM | POA: Diagnosis not present

## 2020-09-14 DIAGNOSIS — M6281 Muscle weakness (generalized): Secondary | ICD-10-CM | POA: Diagnosis not present

## 2020-09-14 DIAGNOSIS — R41841 Cognitive communication deficit: Secondary | ICD-10-CM | POA: Diagnosis not present

## 2020-09-14 DIAGNOSIS — R2689 Other abnormalities of gait and mobility: Secondary | ICD-10-CM | POA: Diagnosis not present

## 2020-09-14 DIAGNOSIS — R1312 Dysphagia, oropharyngeal phase: Secondary | ICD-10-CM | POA: Diagnosis not present

## 2020-09-14 DIAGNOSIS — M6259 Muscle wasting and atrophy, not elsewhere classified, multiple sites: Secondary | ICD-10-CM | POA: Diagnosis not present

## 2020-09-15 DIAGNOSIS — Z20822 Contact with and (suspected) exposure to covid-19: Secondary | ICD-10-CM | POA: Diagnosis not present

## 2020-09-18 DIAGNOSIS — Z20822 Contact with and (suspected) exposure to covid-19: Secondary | ICD-10-CM | POA: Diagnosis not present

## 2020-09-21 DIAGNOSIS — Z20822 Contact with and (suspected) exposure to covid-19: Secondary | ICD-10-CM | POA: Diagnosis not present

## 2020-09-25 DIAGNOSIS — U071 COVID-19: Secondary | ICD-10-CM | POA: Diagnosis not present

## 2020-09-27 DIAGNOSIS — M6259 Muscle wasting and atrophy, not elsewhere classified, multiple sites: Secondary | ICD-10-CM | POA: Diagnosis not present

## 2020-09-27 DIAGNOSIS — R278 Other lack of coordination: Secondary | ICD-10-CM | POA: Diagnosis not present

## 2020-09-27 DIAGNOSIS — M6281 Muscle weakness (generalized): Secondary | ICD-10-CM | POA: Diagnosis not present

## 2020-09-27 DIAGNOSIS — R2689 Other abnormalities of gait and mobility: Secondary | ICD-10-CM | POA: Diagnosis not present

## 2020-09-27 DIAGNOSIS — R1312 Dysphagia, oropharyngeal phase: Secondary | ICD-10-CM | POA: Diagnosis not present

## 2020-09-27 DIAGNOSIS — R41841 Cognitive communication deficit: Secondary | ICD-10-CM | POA: Diagnosis not present

## 2020-09-28 DIAGNOSIS — R634 Abnormal weight loss: Secondary | ICD-10-CM | POA: Diagnosis not present

## 2020-09-28 DIAGNOSIS — U071 COVID-19: Secondary | ICD-10-CM | POA: Diagnosis not present

## 2020-09-28 DIAGNOSIS — R1312 Dysphagia, oropharyngeal phase: Secondary | ICD-10-CM | POA: Diagnosis not present

## 2020-09-28 DIAGNOSIS — R41841 Cognitive communication deficit: Secondary | ICD-10-CM | POA: Diagnosis not present

## 2020-09-28 DIAGNOSIS — R2689 Other abnormalities of gait and mobility: Secondary | ICD-10-CM | POA: Diagnosis not present

## 2020-09-28 DIAGNOSIS — R195 Other fecal abnormalities: Secondary | ICD-10-CM | POA: Diagnosis not present

## 2020-09-28 DIAGNOSIS — R278 Other lack of coordination: Secondary | ICD-10-CM | POA: Diagnosis not present

## 2020-09-28 DIAGNOSIS — M6281 Muscle weakness (generalized): Secondary | ICD-10-CM | POA: Diagnosis not present

## 2020-09-28 DIAGNOSIS — M6259 Muscle wasting and atrophy, not elsewhere classified, multiple sites: Secondary | ICD-10-CM | POA: Diagnosis not present

## 2020-09-29 DIAGNOSIS — R278 Other lack of coordination: Secondary | ICD-10-CM | POA: Diagnosis not present

## 2020-09-29 DIAGNOSIS — R2689 Other abnormalities of gait and mobility: Secondary | ICD-10-CM | POA: Diagnosis not present

## 2020-09-29 DIAGNOSIS — R262 Difficulty in walking, not elsewhere classified: Secondary | ICD-10-CM | POA: Diagnosis not present

## 2020-09-29 DIAGNOSIS — R627 Adult failure to thrive: Secondary | ICD-10-CM | POA: Diagnosis not present

## 2020-09-29 DIAGNOSIS — I2510 Atherosclerotic heart disease of native coronary artery without angina pectoris: Secondary | ICD-10-CM | POA: Diagnosis not present

## 2020-09-29 DIAGNOSIS — R2681 Unsteadiness on feet: Secondary | ICD-10-CM | POA: Diagnosis not present

## 2020-09-29 DIAGNOSIS — R41841 Cognitive communication deficit: Secondary | ICD-10-CM | POA: Diagnosis not present

## 2020-09-29 DIAGNOSIS — M6259 Muscle wasting and atrophy, not elsewhere classified, multiple sites: Secondary | ICD-10-CM | POA: Diagnosis not present

## 2020-09-29 DIAGNOSIS — R1312 Dysphagia, oropharyngeal phase: Secondary | ICD-10-CM | POA: Diagnosis not present

## 2020-09-29 DIAGNOSIS — M6281 Muscle weakness (generalized): Secondary | ICD-10-CM | POA: Diagnosis not present

## 2020-09-29 DIAGNOSIS — J449 Chronic obstructive pulmonary disease, unspecified: Secondary | ICD-10-CM | POA: Diagnosis not present

## 2020-09-30 DIAGNOSIS — R197 Diarrhea, unspecified: Secondary | ICD-10-CM | POA: Diagnosis not present

## 2020-10-02 ENCOUNTER — Telehealth: Payer: Self-pay | Admitting: Emergency Medicine

## 2020-10-02 DIAGNOSIS — R195 Other fecal abnormalities: Secondary | ICD-10-CM | POA: Diagnosis not present

## 2020-10-02 DIAGNOSIS — A0472 Enterocolitis due to Clostridium difficile, not specified as recurrent: Secondary | ICD-10-CM | POA: Diagnosis not present

## 2020-10-02 DIAGNOSIS — R627 Adult failure to thrive: Secondary | ICD-10-CM | POA: Diagnosis not present

## 2020-10-02 DIAGNOSIS — K5900 Constipation, unspecified: Secondary | ICD-10-CM | POA: Diagnosis not present

## 2020-10-02 NOTE — Telephone Encounter (Signed)
Thanks for letting me know.  We may need to refill his meds if he was due

## 2020-10-02 NOTE — Telephone Encounter (Signed)
pt's daughter calling to state pt is now in nursing home [facility] ) - wants to give Dr. Lamonte Sakai in house MD name & number - DR. Jules Husbands - [phone] - if Dr. Keenan Bachelor is not available  Director of [place] - direct cell # [phone] - daughter doesn't know if Dr. Lamonte Sakai needs to reach out or not - states that pt is going down hill quick -

## 2020-10-02 NOTE — Telephone Encounter (Signed)
Spoke with the pt's daughter  She states that she received letter that it's time for the pt to f/u with Dr Lamonte Sakai  She wanted to let us know that pt is not doing well overall and has been taken to [facility]  He is refusing to eat and "basically just given up"  Will not be able to schedule appt at this time  Will forward to Dr Lamonte Sakai to make him aware  I advised her to please call us if pt needs anything

## 2020-10-02 NOTE — Telephone Encounter (Signed)
Called and spoke with daughter who states she believes [facility] should reach out to [place] when patient is in need to refills. He is on Yupelri and they get it from [place] so she is not sure if [facility] will reach out to [place] or to them. Advised her I would make a note and we would just wait to hear from them if patient needed anything. She expressed understanding. Nothing further needed at this time.

## 2020-10-03 DIAGNOSIS — U071 COVID-19: Secondary | ICD-10-CM | POA: Diagnosis not present

## 2020-10-05 DIAGNOSIS — R278 Other lack of coordination: Secondary | ICD-10-CM | POA: Diagnosis not present

## 2020-10-05 DIAGNOSIS — U071 COVID-19: Secondary | ICD-10-CM | POA: Diagnosis not present

## 2020-10-05 DIAGNOSIS — R1312 Dysphagia, oropharyngeal phase: Secondary | ICD-10-CM | POA: Diagnosis not present

## 2020-10-05 DIAGNOSIS — M6259 Muscle wasting and atrophy, not elsewhere classified, multiple sites: Secondary | ICD-10-CM | POA: Diagnosis not present

## 2020-10-05 DIAGNOSIS — J449 Chronic obstructive pulmonary disease, unspecified: Secondary | ICD-10-CM | POA: Diagnosis not present

## 2020-10-05 DIAGNOSIS — M6281 Muscle weakness (generalized): Secondary | ICD-10-CM | POA: Diagnosis not present

## 2020-10-05 DIAGNOSIS — R41841 Cognitive communication deficit: Secondary | ICD-10-CM | POA: Diagnosis not present

## 2020-10-06 DIAGNOSIS — N400 Enlarged prostate without lower urinary tract symptoms: Secondary | ICD-10-CM | POA: Diagnosis not present

## 2020-10-06 DIAGNOSIS — Z950 Presence of cardiac pacemaker: Secondary | ICD-10-CM | POA: Diagnosis not present

## 2020-10-06 DIAGNOSIS — D631 Anemia in chronic kidney disease: Secondary | ICD-10-CM | POA: Diagnosis not present

## 2020-10-06 DIAGNOSIS — A0472 Enterocolitis due to Clostridium difficile, not specified as recurrent: Secondary | ICD-10-CM | POA: Diagnosis not present

## 2020-10-06 DIAGNOSIS — Z681 Body mass index (BMI) 19 or less, adult: Secondary | ICD-10-CM | POA: Diagnosis not present

## 2020-10-06 DIAGNOSIS — H9191 Unspecified hearing loss, right ear: Secondary | ICD-10-CM | POA: Diagnosis not present

## 2020-10-06 DIAGNOSIS — J439 Emphysema, unspecified: Secondary | ICD-10-CM | POA: Diagnosis not present

## 2020-10-06 DIAGNOSIS — H04129 Dry eye syndrome of unspecified lacrimal gland: Secondary | ICD-10-CM | POA: Diagnosis not present

## 2020-10-06 DIAGNOSIS — H6042 Cholesteatoma of left external ear: Secondary | ICD-10-CM | POA: Diagnosis not present

## 2020-10-06 DIAGNOSIS — N183 Chronic kidney disease, stage 3 unspecified: Secondary | ICD-10-CM | POA: Diagnosis not present

## 2020-10-06 DIAGNOSIS — I959 Hypotension, unspecified: Secondary | ICD-10-CM | POA: Diagnosis not present

## 2020-10-06 DIAGNOSIS — I4891 Unspecified atrial fibrillation: Secondary | ICD-10-CM | POA: Diagnosis not present

## 2020-10-06 DIAGNOSIS — J9611 Chronic respiratory failure with hypoxia: Secondary | ICD-10-CM | POA: Diagnosis not present

## 2020-10-06 DIAGNOSIS — L219 Seborrheic dermatitis, unspecified: Secondary | ICD-10-CM | POA: Diagnosis not present

## 2020-10-06 DIAGNOSIS — H409 Unspecified glaucoma: Secondary | ICD-10-CM | POA: Diagnosis not present

## 2020-10-06 DIAGNOSIS — M1990 Unspecified osteoarthritis, unspecified site: Secondary | ICD-10-CM | POA: Diagnosis not present

## 2020-10-06 DIAGNOSIS — D472 Monoclonal gammopathy: Secondary | ICD-10-CM | POA: Diagnosis not present

## 2020-10-06 DIAGNOSIS — E785 Hyperlipidemia, unspecified: Secondary | ICD-10-CM | POA: Diagnosis not present

## 2020-10-06 DIAGNOSIS — H3530 Unspecified macular degeneration: Secondary | ICD-10-CM | POA: Diagnosis not present

## 2020-10-06 DIAGNOSIS — R634 Abnormal weight loss: Secondary | ICD-10-CM | POA: Diagnosis not present

## 2020-10-06 DIAGNOSIS — F32A Depression, unspecified: Secondary | ICD-10-CM | POA: Diagnosis not present

## 2020-10-06 DIAGNOSIS — I2510 Atherosclerotic heart disease of native coronary artery without angina pectoris: Secondary | ICD-10-CM | POA: Diagnosis not present

## 2020-10-07 DIAGNOSIS — J439 Emphysema, unspecified: Secondary | ICD-10-CM | POA: Diagnosis not present

## 2020-10-07 DIAGNOSIS — J9611 Chronic respiratory failure with hypoxia: Secondary | ICD-10-CM | POA: Diagnosis not present

## 2020-10-07 DIAGNOSIS — I959 Hypotension, unspecified: Secondary | ICD-10-CM | POA: Diagnosis not present

## 2020-10-07 DIAGNOSIS — R634 Abnormal weight loss: Secondary | ICD-10-CM | POA: Diagnosis not present

## 2020-10-07 DIAGNOSIS — D472 Monoclonal gammopathy: Secondary | ICD-10-CM | POA: Diagnosis not present

## 2020-10-07 DIAGNOSIS — A0472 Enterocolitis due to Clostridium difficile, not specified as recurrent: Secondary | ICD-10-CM | POA: Diagnosis not present

## 2020-10-09 DIAGNOSIS — J9611 Chronic respiratory failure with hypoxia: Secondary | ICD-10-CM | POA: Diagnosis not present

## 2020-10-09 DIAGNOSIS — J439 Emphysema, unspecified: Secondary | ICD-10-CM | POA: Diagnosis not present

## 2020-10-09 DIAGNOSIS — A0472 Enterocolitis due to Clostridium difficile, not specified as recurrent: Secondary | ICD-10-CM | POA: Diagnosis not present

## 2020-10-09 DIAGNOSIS — D472 Monoclonal gammopathy: Secondary | ICD-10-CM | POA: Diagnosis not present

## 2020-10-09 DIAGNOSIS — I959 Hypotension, unspecified: Secondary | ICD-10-CM | POA: Diagnosis not present

## 2020-10-09 DIAGNOSIS — R634 Abnormal weight loss: Secondary | ICD-10-CM | POA: Diagnosis not present

## 2020-10-10 DIAGNOSIS — J449 Chronic obstructive pulmonary disease, unspecified: Secondary | ICD-10-CM | POA: Diagnosis not present

## 2020-10-10 DIAGNOSIS — D539 Nutritional anemia, unspecified: Secondary | ICD-10-CM | POA: Diagnosis not present

## 2020-10-10 DIAGNOSIS — R627 Adult failure to thrive: Secondary | ICD-10-CM | POA: Diagnosis not present

## 2020-10-10 DIAGNOSIS — I4891 Unspecified atrial fibrillation: Secondary | ICD-10-CM | POA: Diagnosis not present

## 2020-10-11 DIAGNOSIS — E785 Hyperlipidemia, unspecified: Secondary | ICD-10-CM | POA: Diagnosis not present

## 2020-10-11 DIAGNOSIS — I959 Hypotension, unspecified: Secondary | ICD-10-CM | POA: Diagnosis not present

## 2020-10-11 DIAGNOSIS — J9611 Chronic respiratory failure with hypoxia: Secondary | ICD-10-CM | POA: Diagnosis not present

## 2020-10-11 DIAGNOSIS — A0472 Enterocolitis due to Clostridium difficile, not specified as recurrent: Secondary | ICD-10-CM | POA: Diagnosis not present

## 2020-10-11 DIAGNOSIS — K829 Disease of gallbladder, unspecified: Secondary | ICD-10-CM | POA: Diagnosis not present

## 2020-10-11 DIAGNOSIS — R634 Abnormal weight loss: Secondary | ICD-10-CM | POA: Diagnosis not present

## 2020-10-11 DIAGNOSIS — R627 Adult failure to thrive: Secondary | ICD-10-CM | POA: Diagnosis not present

## 2020-10-11 DIAGNOSIS — J439 Emphysema, unspecified: Secondary | ICD-10-CM | POA: Diagnosis not present

## 2020-10-11 DIAGNOSIS — D472 Monoclonal gammopathy: Secondary | ICD-10-CM | POA: Diagnosis not present

## 2020-10-11 DIAGNOSIS — N183 Chronic kidney disease, stage 3 unspecified: Secondary | ICD-10-CM | POA: Diagnosis not present

## 2020-10-12 DIAGNOSIS — U071 COVID-19: Secondary | ICD-10-CM | POA: Diagnosis not present

## 2020-10-13 DIAGNOSIS — Z0189 Encounter for other specified special examinations: Secondary | ICD-10-CM | POA: Diagnosis not present

## 2020-10-13 DIAGNOSIS — R627 Adult failure to thrive: Secondary | ICD-10-CM | POA: Diagnosis not present

## 2020-10-13 DIAGNOSIS — A048 Other specified bacterial intestinal infections: Secondary | ICD-10-CM | POA: Diagnosis not present

## 2020-10-16 DIAGNOSIS — G8929 Other chronic pain: Secondary | ICD-10-CM | POA: Diagnosis not present

## 2020-10-16 DIAGNOSIS — U071 COVID-19: Secondary | ICD-10-CM | POA: Diagnosis not present

## 2020-10-18 DIAGNOSIS — I959 Hypotension, unspecified: Secondary | ICD-10-CM | POA: Diagnosis not present

## 2020-10-18 DIAGNOSIS — J439 Emphysema, unspecified: Secondary | ICD-10-CM | POA: Diagnosis not present

## 2020-10-18 DIAGNOSIS — D472 Monoclonal gammopathy: Secondary | ICD-10-CM | POA: Diagnosis not present

## 2020-10-18 DIAGNOSIS — J9611 Chronic respiratory failure with hypoxia: Secondary | ICD-10-CM | POA: Diagnosis not present

## 2020-10-18 DIAGNOSIS — R634 Abnormal weight loss: Secondary | ICD-10-CM | POA: Diagnosis not present

## 2020-10-18 DIAGNOSIS — A0472 Enterocolitis due to Clostridium difficile, not specified as recurrent: Secondary | ICD-10-CM | POA: Diagnosis not present

## 2020-10-18 LAB — PULMONARY FUNCTION TEST
DL/VA % pred: 64 %
DL/VA: 2.93 ml/min/mmHg/L
DLCO unc % pred: 40 %
DLCO unc: 13.15 ml/min/mmHg
FEF 25-75 Post: 1.18 L/sec
FEF 25-75 Pre: 0.83 L/sec
FEF2575-%Change-Post: 43 %
FEF2575-%Pred-Post: 63 %
FEF2575-%Pred-Pre: 44 %
FEV1-%Change-Post: 9 %
FEV1-%Pred-Post: 62 %
FEV1-%Pred-Pre: 57 %
FEV1-Post: 1.74 L
FEV1-Pre: 1.59 L
FEV1FVC-%Change-Post: 3 %
FEV1FVC-%Pred-Pre: 91 %
FEV6-%Change-Post: 7 %
FEV6-%Pred-Post: 69 %
FEV6-%Pred-Pre: 65 %
FEV6-Post: 2.56 L
FEV6-Pre: 2.39 L
FEV6FVC-%Change-Post: 1 %
FEV6FVC-%Pred-Post: 107 %
FEV6FVC-%Pred-Pre: 105 %
FVC-%Change-Post: 5 %
FVC-%Pred-Post: 65 %
FVC-%Pred-Pre: 61 %
FVC-Post: 2.57 L
Post FEV1/FVC ratio: 68 %
Post FEV6/FVC ratio: 100 %
Pre FEV1/FVC ratio: 65 %
Pre FEV6/FVC Ratio: 98 %
RV % pred: 94 %
RV: 2.56 L
TLC % pred: 66 %
TLC: 4.72 L

## 2020-10-19 DIAGNOSIS — Z20822 Contact with and (suspected) exposure to covid-19: Secondary | ICD-10-CM | POA: Diagnosis not present

## 2020-10-20 DIAGNOSIS — D472 Monoclonal gammopathy: Secondary | ICD-10-CM | POA: Diagnosis not present

## 2020-10-20 DIAGNOSIS — J9611 Chronic respiratory failure with hypoxia: Secondary | ICD-10-CM | POA: Diagnosis not present

## 2020-10-20 DIAGNOSIS — J439 Emphysema, unspecified: Secondary | ICD-10-CM | POA: Diagnosis not present

## 2020-10-20 DIAGNOSIS — R634 Abnormal weight loss: Secondary | ICD-10-CM | POA: Diagnosis not present

## 2020-10-20 DIAGNOSIS — A0472 Enterocolitis due to Clostridium difficile, not specified as recurrent: Secondary | ICD-10-CM | POA: Diagnosis not present

## 2020-10-20 DIAGNOSIS — I959 Hypotension, unspecified: Secondary | ICD-10-CM | POA: Diagnosis not present

## 2020-10-24 DIAGNOSIS — A0472 Enterocolitis due to Clostridium difficile, not specified as recurrent: Secondary | ICD-10-CM | POA: Diagnosis not present

## 2020-10-24 DIAGNOSIS — R634 Abnormal weight loss: Secondary | ICD-10-CM | POA: Diagnosis not present

## 2020-10-24 DIAGNOSIS — J9611 Chronic respiratory failure with hypoxia: Secondary | ICD-10-CM | POA: Diagnosis not present

## 2020-10-24 DIAGNOSIS — D472 Monoclonal gammopathy: Secondary | ICD-10-CM | POA: Diagnosis not present

## 2020-10-24 DIAGNOSIS — I959 Hypotension, unspecified: Secondary | ICD-10-CM | POA: Diagnosis not present

## 2020-10-24 DIAGNOSIS — J439 Emphysema, unspecified: Secondary | ICD-10-CM | POA: Diagnosis not present

## 2020-10-26 DIAGNOSIS — R634 Abnormal weight loss: Secondary | ICD-10-CM | POA: Diagnosis not present

## 2020-10-26 DIAGNOSIS — J9611 Chronic respiratory failure with hypoxia: Secondary | ICD-10-CM | POA: Diagnosis not present

## 2020-10-26 DIAGNOSIS — I959 Hypotension, unspecified: Secondary | ICD-10-CM | POA: Diagnosis not present

## 2020-10-26 DIAGNOSIS — J439 Emphysema, unspecified: Secondary | ICD-10-CM | POA: Diagnosis not present

## 2020-10-26 DIAGNOSIS — D472 Monoclonal gammopathy: Secondary | ICD-10-CM | POA: Diagnosis not present

## 2020-10-26 DIAGNOSIS — A0472 Enterocolitis due to Clostridium difficile, not specified as recurrent: Secondary | ICD-10-CM | POA: Diagnosis not present

## 2020-10-28 DIAGNOSIS — J9611 Chronic respiratory failure with hypoxia: Secondary | ICD-10-CM | POA: Diagnosis not present

## 2020-10-28 DIAGNOSIS — J439 Emphysema, unspecified: Secondary | ICD-10-CM | POA: Diagnosis not present

## 2020-10-28 DIAGNOSIS — D472 Monoclonal gammopathy: Secondary | ICD-10-CM | POA: Diagnosis not present

## 2020-10-28 DIAGNOSIS — A0472 Enterocolitis due to Clostridium difficile, not specified as recurrent: Secondary | ICD-10-CM | POA: Diagnosis not present

## 2020-10-28 DIAGNOSIS — R634 Abnormal weight loss: Secondary | ICD-10-CM | POA: Diagnosis not present

## 2020-10-28 DIAGNOSIS — I959 Hypotension, unspecified: Secondary | ICD-10-CM | POA: Diagnosis not present

## 2020-10-30 DIAGNOSIS — I2510 Atherosclerotic heart disease of native coronary artery without angina pectoris: Secondary | ICD-10-CM | POA: Diagnosis not present

## 2020-10-30 DIAGNOSIS — N183 Chronic kidney disease, stage 3 unspecified: Secondary | ICD-10-CM | POA: Diagnosis not present

## 2020-10-30 DIAGNOSIS — H409 Unspecified glaucoma: Secondary | ICD-10-CM | POA: Diagnosis not present

## 2020-10-30 DIAGNOSIS — I959 Hypotension, unspecified: Secondary | ICD-10-CM | POA: Diagnosis not present

## 2020-10-30 DIAGNOSIS — F32A Depression, unspecified: Secondary | ICD-10-CM | POA: Diagnosis not present

## 2020-10-30 DIAGNOSIS — A0472 Enterocolitis due to Clostridium difficile, not specified as recurrent: Secondary | ICD-10-CM | POA: Diagnosis not present

## 2020-10-30 DIAGNOSIS — H6042 Cholesteatoma of left external ear: Secondary | ICD-10-CM | POA: Diagnosis not present

## 2020-10-30 DIAGNOSIS — E785 Hyperlipidemia, unspecified: Secondary | ICD-10-CM | POA: Diagnosis not present

## 2020-10-30 DIAGNOSIS — J9611 Chronic respiratory failure with hypoxia: Secondary | ICD-10-CM | POA: Diagnosis not present

## 2020-10-30 DIAGNOSIS — I4891 Unspecified atrial fibrillation: Secondary | ICD-10-CM | POA: Diagnosis not present

## 2020-10-30 DIAGNOSIS — N400 Enlarged prostate without lower urinary tract symptoms: Secondary | ICD-10-CM | POA: Diagnosis not present

## 2020-10-30 DIAGNOSIS — H3530 Unspecified macular degeneration: Secondary | ICD-10-CM | POA: Diagnosis not present

## 2020-10-30 DIAGNOSIS — H9191 Unspecified hearing loss, right ear: Secondary | ICD-10-CM | POA: Diagnosis not present

## 2020-10-30 DIAGNOSIS — Z950 Presence of cardiac pacemaker: Secondary | ICD-10-CM | POA: Diagnosis not present

## 2020-10-30 DIAGNOSIS — J439 Emphysema, unspecified: Secondary | ICD-10-CM | POA: Diagnosis not present

## 2020-10-30 DIAGNOSIS — D631 Anemia in chronic kidney disease: Secondary | ICD-10-CM | POA: Diagnosis not present

## 2020-10-30 DIAGNOSIS — L219 Seborrheic dermatitis, unspecified: Secondary | ICD-10-CM | POA: Diagnosis not present

## 2020-10-30 DIAGNOSIS — R634 Abnormal weight loss: Secondary | ICD-10-CM | POA: Diagnosis not present

## 2020-10-30 DIAGNOSIS — H04129 Dry eye syndrome of unspecified lacrimal gland: Secondary | ICD-10-CM | POA: Diagnosis not present

## 2020-10-30 DIAGNOSIS — D472 Monoclonal gammopathy: Secondary | ICD-10-CM | POA: Diagnosis not present

## 2020-10-30 DIAGNOSIS — Z681 Body mass index (BMI) 19 or less, adult: Secondary | ICD-10-CM | POA: Diagnosis not present

## 2020-10-30 DIAGNOSIS — M1990 Unspecified osteoarthritis, unspecified site: Secondary | ICD-10-CM | POA: Diagnosis not present

## 2020-10-31 DIAGNOSIS — D472 Monoclonal gammopathy: Secondary | ICD-10-CM | POA: Diagnosis not present

## 2020-10-31 DIAGNOSIS — A0472 Enterocolitis due to Clostridium difficile, not specified as recurrent: Secondary | ICD-10-CM | POA: Diagnosis not present

## 2020-10-31 DIAGNOSIS — J9611 Chronic respiratory failure with hypoxia: Secondary | ICD-10-CM | POA: Diagnosis not present

## 2020-10-31 DIAGNOSIS — R634 Abnormal weight loss: Secondary | ICD-10-CM | POA: Diagnosis not present

## 2020-10-31 DIAGNOSIS — I959 Hypotension, unspecified: Secondary | ICD-10-CM | POA: Diagnosis not present

## 2020-10-31 DIAGNOSIS — J439 Emphysema, unspecified: Secondary | ICD-10-CM | POA: Diagnosis not present

## 2020-11-01 DIAGNOSIS — A0472 Enterocolitis due to Clostridium difficile, not specified as recurrent: Secondary | ICD-10-CM | POA: Diagnosis not present

## 2020-11-01 DIAGNOSIS — D472 Monoclonal gammopathy: Secondary | ICD-10-CM | POA: Diagnosis not present

## 2020-11-01 DIAGNOSIS — I959 Hypotension, unspecified: Secondary | ICD-10-CM | POA: Diagnosis not present

## 2020-11-01 DIAGNOSIS — J439 Emphysema, unspecified: Secondary | ICD-10-CM | POA: Diagnosis not present

## 2020-11-01 DIAGNOSIS — J9611 Chronic respiratory failure with hypoxia: Secondary | ICD-10-CM | POA: Diagnosis not present

## 2020-11-01 DIAGNOSIS — R634 Abnormal weight loss: Secondary | ICD-10-CM | POA: Diagnosis not present

## 2020-11-02 DIAGNOSIS — J439 Emphysema, unspecified: Secondary | ICD-10-CM | POA: Diagnosis not present

## 2020-11-02 DIAGNOSIS — N183 Chronic kidney disease, stage 3 unspecified: Secondary | ICD-10-CM | POA: Diagnosis not present

## 2020-11-02 DIAGNOSIS — I959 Hypotension, unspecified: Secondary | ICD-10-CM | POA: Diagnosis not present

## 2020-11-02 DIAGNOSIS — D472 Monoclonal gammopathy: Secondary | ICD-10-CM | POA: Diagnosis not present

## 2020-11-02 DIAGNOSIS — I10 Essential (primary) hypertension: Secondary | ICD-10-CM | POA: Diagnosis not present

## 2020-11-02 DIAGNOSIS — A0472 Enterocolitis due to Clostridium difficile, not specified as recurrent: Secondary | ICD-10-CM | POA: Diagnosis not present

## 2020-11-02 DIAGNOSIS — R634 Abnormal weight loss: Secondary | ICD-10-CM | POA: Diagnosis not present

## 2020-11-02 DIAGNOSIS — J449 Chronic obstructive pulmonary disease, unspecified: Secondary | ICD-10-CM | POA: Diagnosis not present

## 2020-11-02 DIAGNOSIS — J9611 Chronic respiratory failure with hypoxia: Secondary | ICD-10-CM | POA: Diagnosis not present

## 2020-11-02 DIAGNOSIS — E785 Hyperlipidemia, unspecified: Secondary | ICD-10-CM | POA: Diagnosis not present

## 2020-11-02 DIAGNOSIS — K829 Disease of gallbladder, unspecified: Secondary | ICD-10-CM | POA: Diagnosis not present

## 2020-11-02 DIAGNOSIS — R197 Diarrhea, unspecified: Secondary | ICD-10-CM | POA: Diagnosis not present

## 2020-11-02 DIAGNOSIS — D519 Vitamin B12 deficiency anemia, unspecified: Secondary | ICD-10-CM | POA: Diagnosis not present

## 2020-11-06 DIAGNOSIS — A0472 Enterocolitis due to Clostridium difficile, not specified as recurrent: Secondary | ICD-10-CM | POA: Diagnosis not present

## 2020-11-06 DIAGNOSIS — J439 Emphysema, unspecified: Secondary | ICD-10-CM | POA: Diagnosis not present

## 2020-11-06 DIAGNOSIS — R634 Abnormal weight loss: Secondary | ICD-10-CM | POA: Diagnosis not present

## 2020-11-06 DIAGNOSIS — I959 Hypotension, unspecified: Secondary | ICD-10-CM | POA: Diagnosis not present

## 2020-11-06 DIAGNOSIS — D472 Monoclonal gammopathy: Secondary | ICD-10-CM | POA: Diagnosis not present

## 2020-11-06 DIAGNOSIS — J9611 Chronic respiratory failure with hypoxia: Secondary | ICD-10-CM | POA: Diagnosis not present

## 2020-11-08 DIAGNOSIS — I959 Hypotension, unspecified: Secondary | ICD-10-CM | POA: Diagnosis not present

## 2020-11-08 DIAGNOSIS — J439 Emphysema, unspecified: Secondary | ICD-10-CM | POA: Diagnosis not present

## 2020-11-08 DIAGNOSIS — D472 Monoclonal gammopathy: Secondary | ICD-10-CM | POA: Diagnosis not present

## 2020-11-08 DIAGNOSIS — R634 Abnormal weight loss: Secondary | ICD-10-CM | POA: Diagnosis not present

## 2020-11-08 DIAGNOSIS — J9611 Chronic respiratory failure with hypoxia: Secondary | ICD-10-CM | POA: Diagnosis not present

## 2020-11-08 DIAGNOSIS — A0472 Enterocolitis due to Clostridium difficile, not specified as recurrent: Secondary | ICD-10-CM | POA: Diagnosis not present

## 2020-11-09 DIAGNOSIS — I9589 Other hypotension: Secondary | ICD-10-CM | POA: Diagnosis not present

## 2020-11-09 DIAGNOSIS — I480 Paroxysmal atrial fibrillation: Secondary | ICD-10-CM | POA: Diagnosis not present

## 2020-11-09 DIAGNOSIS — R627 Adult failure to thrive: Secondary | ICD-10-CM | POA: Diagnosis not present

## 2020-11-09 DIAGNOSIS — W1789XA Other fall from one level to another, initial encounter: Secondary | ICD-10-CM | POA: Diagnosis not present

## 2020-11-17 DIAGNOSIS — E46 Unspecified protein-calorie malnutrition: Secondary | ICD-10-CM | POA: Diagnosis not present

## 2020-11-17 DIAGNOSIS — A0472 Enterocolitis due to Clostridium difficile, not specified as recurrent: Secondary | ICD-10-CM | POA: Diagnosis not present

## 2020-11-17 DIAGNOSIS — R634 Abnormal weight loss: Secondary | ICD-10-CM | POA: Diagnosis not present

## 2020-11-17 DIAGNOSIS — J439 Emphysema, unspecified: Secondary | ICD-10-CM | POA: Diagnosis not present

## 2020-11-17 DIAGNOSIS — R627 Adult failure to thrive: Secondary | ICD-10-CM | POA: Diagnosis not present

## 2020-11-17 DIAGNOSIS — D472 Monoclonal gammopathy: Secondary | ICD-10-CM | POA: Diagnosis not present

## 2020-11-17 DIAGNOSIS — J9611 Chronic respiratory failure with hypoxia: Secondary | ICD-10-CM | POA: Diagnosis not present

## 2020-11-17 DIAGNOSIS — I959 Hypotension, unspecified: Secondary | ICD-10-CM | POA: Diagnosis not present

## 2020-11-17 DIAGNOSIS — I9589 Other hypotension: Secondary | ICD-10-CM | POA: Diagnosis not present

## 2020-11-20 DIAGNOSIS — I959 Hypotension, unspecified: Secondary | ICD-10-CM | POA: Diagnosis not present

## 2020-11-20 DIAGNOSIS — J439 Emphysema, unspecified: Secondary | ICD-10-CM | POA: Diagnosis not present

## 2020-11-20 DIAGNOSIS — D472 Monoclonal gammopathy: Secondary | ICD-10-CM | POA: Diagnosis not present

## 2020-11-20 DIAGNOSIS — A0472 Enterocolitis due to Clostridium difficile, not specified as recurrent: Secondary | ICD-10-CM | POA: Diagnosis not present

## 2020-11-20 DIAGNOSIS — R634 Abnormal weight loss: Secondary | ICD-10-CM | POA: Diagnosis not present

## 2020-11-20 DIAGNOSIS — J9611 Chronic respiratory failure with hypoxia: Secondary | ICD-10-CM | POA: Diagnosis not present

## 2020-11-22 DIAGNOSIS — J9611 Chronic respiratory failure with hypoxia: Secondary | ICD-10-CM | POA: Diagnosis not present

## 2020-11-22 DIAGNOSIS — I959 Hypotension, unspecified: Secondary | ICD-10-CM | POA: Diagnosis not present

## 2020-11-22 DIAGNOSIS — J439 Emphysema, unspecified: Secondary | ICD-10-CM | POA: Diagnosis not present

## 2020-11-22 DIAGNOSIS — R634 Abnormal weight loss: Secondary | ICD-10-CM | POA: Diagnosis not present

## 2020-11-22 DIAGNOSIS — A0472 Enterocolitis due to Clostridium difficile, not specified as recurrent: Secondary | ICD-10-CM | POA: Diagnosis not present

## 2020-11-22 DIAGNOSIS — D472 Monoclonal gammopathy: Secondary | ICD-10-CM | POA: Diagnosis not present

## 2020-11-27 DIAGNOSIS — R634 Abnormal weight loss: Secondary | ICD-10-CM | POA: Diagnosis not present

## 2020-11-27 DIAGNOSIS — I959 Hypotension, unspecified: Secondary | ICD-10-CM | POA: Diagnosis not present

## 2020-11-27 DIAGNOSIS — J439 Emphysema, unspecified: Secondary | ICD-10-CM | POA: Diagnosis not present

## 2020-11-27 DIAGNOSIS — A0472 Enterocolitis due to Clostridium difficile, not specified as recurrent: Secondary | ICD-10-CM | POA: Diagnosis not present

## 2020-11-27 DIAGNOSIS — D472 Monoclonal gammopathy: Secondary | ICD-10-CM | POA: Diagnosis not present

## 2020-11-27 DIAGNOSIS — J9611 Chronic respiratory failure with hypoxia: Secondary | ICD-10-CM | POA: Diagnosis not present

## 2020-11-28 DIAGNOSIS — I9589 Other hypotension: Secondary | ICD-10-CM | POA: Diagnosis not present

## 2020-11-28 DIAGNOSIS — W1789XA Other fall from one level to another, initial encounter: Secondary | ICD-10-CM | POA: Diagnosis not present

## 2020-11-29 DIAGNOSIS — H04129 Dry eye syndrome of unspecified lacrimal gland: Secondary | ICD-10-CM | POA: Diagnosis not present

## 2020-11-29 DIAGNOSIS — F32A Depression, unspecified: Secondary | ICD-10-CM | POA: Diagnosis not present

## 2020-11-29 DIAGNOSIS — I959 Hypotension, unspecified: Secondary | ICD-10-CM | POA: Diagnosis not present

## 2020-11-29 DIAGNOSIS — J9611 Chronic respiratory failure with hypoxia: Secondary | ICD-10-CM | POA: Diagnosis not present

## 2020-11-29 DIAGNOSIS — H6042 Cholesteatoma of left external ear: Secondary | ICD-10-CM | POA: Diagnosis not present

## 2020-11-29 DIAGNOSIS — H409 Unspecified glaucoma: Secondary | ICD-10-CM | POA: Diagnosis not present

## 2020-11-29 DIAGNOSIS — R634 Abnormal weight loss: Secondary | ICD-10-CM | POA: Diagnosis not present

## 2020-11-29 DIAGNOSIS — D472 Monoclonal gammopathy: Secondary | ICD-10-CM | POA: Diagnosis not present

## 2020-11-29 DIAGNOSIS — A0472 Enterocolitis due to Clostridium difficile, not specified as recurrent: Secondary | ICD-10-CM | POA: Diagnosis not present

## 2020-11-29 DIAGNOSIS — M1990 Unspecified osteoarthritis, unspecified site: Secondary | ICD-10-CM | POA: Diagnosis not present

## 2020-11-29 DIAGNOSIS — L219 Seborrheic dermatitis, unspecified: Secondary | ICD-10-CM | POA: Diagnosis not present

## 2020-11-29 DIAGNOSIS — D631 Anemia in chronic kidney disease: Secondary | ICD-10-CM | POA: Diagnosis not present

## 2020-11-29 DIAGNOSIS — I4891 Unspecified atrial fibrillation: Secondary | ICD-10-CM | POA: Diagnosis not present

## 2020-11-29 DIAGNOSIS — H3530 Unspecified macular degeneration: Secondary | ICD-10-CM | POA: Diagnosis not present

## 2020-11-29 DIAGNOSIS — J439 Emphysema, unspecified: Secondary | ICD-10-CM | POA: Diagnosis not present

## 2020-11-29 DIAGNOSIS — Z950 Presence of cardiac pacemaker: Secondary | ICD-10-CM | POA: Diagnosis not present

## 2020-11-29 DIAGNOSIS — Z681 Body mass index (BMI) 19 or less, adult: Secondary | ICD-10-CM | POA: Diagnosis not present

## 2020-11-29 DIAGNOSIS — I2510 Atherosclerotic heart disease of native coronary artery without angina pectoris: Secondary | ICD-10-CM | POA: Diagnosis not present

## 2020-11-29 DIAGNOSIS — H9191 Unspecified hearing loss, right ear: Secondary | ICD-10-CM | POA: Diagnosis not present

## 2020-11-29 DIAGNOSIS — E785 Hyperlipidemia, unspecified: Secondary | ICD-10-CM | POA: Diagnosis not present

## 2020-11-29 DIAGNOSIS — N400 Enlarged prostate without lower urinary tract symptoms: Secondary | ICD-10-CM | POA: Diagnosis not present

## 2020-11-29 DIAGNOSIS — N183 Chronic kidney disease, stage 3 unspecified: Secondary | ICD-10-CM | POA: Diagnosis not present

## 2020-12-04 DIAGNOSIS — J439 Emphysema, unspecified: Secondary | ICD-10-CM | POA: Diagnosis not present

## 2020-12-04 DIAGNOSIS — D472 Monoclonal gammopathy: Secondary | ICD-10-CM | POA: Diagnosis not present

## 2020-12-04 DIAGNOSIS — R634 Abnormal weight loss: Secondary | ICD-10-CM | POA: Diagnosis not present

## 2020-12-04 DIAGNOSIS — I959 Hypotension, unspecified: Secondary | ICD-10-CM | POA: Diagnosis not present

## 2020-12-04 DIAGNOSIS — A0472 Enterocolitis due to Clostridium difficile, not specified as recurrent: Secondary | ICD-10-CM | POA: Diagnosis not present

## 2020-12-04 DIAGNOSIS — J9611 Chronic respiratory failure with hypoxia: Secondary | ICD-10-CM | POA: Diagnosis not present

## 2020-12-05 DIAGNOSIS — D472 Monoclonal gammopathy: Secondary | ICD-10-CM | POA: Diagnosis not present

## 2020-12-05 DIAGNOSIS — J9611 Chronic respiratory failure with hypoxia: Secondary | ICD-10-CM | POA: Diagnosis not present

## 2020-12-05 DIAGNOSIS — Z515 Encounter for palliative care: Secondary | ICD-10-CM | POA: Diagnosis not present

## 2020-12-05 DIAGNOSIS — R296 Repeated falls: Secondary | ICD-10-CM | POA: Diagnosis not present

## 2020-12-05 DIAGNOSIS — A0472 Enterocolitis due to Clostridium difficile, not specified as recurrent: Secondary | ICD-10-CM | POA: Diagnosis not present

## 2020-12-05 DIAGNOSIS — R627 Adult failure to thrive: Secondary | ICD-10-CM | POA: Diagnosis not present

## 2020-12-05 DIAGNOSIS — J439 Emphysema, unspecified: Secondary | ICD-10-CM | POA: Diagnosis not present

## 2020-12-05 DIAGNOSIS — R451 Restlessness and agitation: Secondary | ICD-10-CM | POA: Diagnosis not present

## 2020-12-05 DIAGNOSIS — I959 Hypotension, unspecified: Secondary | ICD-10-CM | POA: Diagnosis not present

## 2020-12-05 DIAGNOSIS — R634 Abnormal weight loss: Secondary | ICD-10-CM | POA: Diagnosis not present

## 2020-12-06 DIAGNOSIS — J439 Emphysema, unspecified: Secondary | ICD-10-CM | POA: Diagnosis not present

## 2020-12-06 DIAGNOSIS — J9611 Chronic respiratory failure with hypoxia: Secondary | ICD-10-CM | POA: Diagnosis not present

## 2020-12-06 DIAGNOSIS — I959 Hypotension, unspecified: Secondary | ICD-10-CM | POA: Diagnosis not present

## 2020-12-06 DIAGNOSIS — A0472 Enterocolitis due to Clostridium difficile, not specified as recurrent: Secondary | ICD-10-CM | POA: Diagnosis not present

## 2020-12-06 DIAGNOSIS — R634 Abnormal weight loss: Secondary | ICD-10-CM | POA: Diagnosis not present

## 2020-12-06 DIAGNOSIS — D472 Monoclonal gammopathy: Secondary | ICD-10-CM | POA: Diagnosis not present

## 2020-12-07 DIAGNOSIS — I480 Paroxysmal atrial fibrillation: Secondary | ICD-10-CM | POA: Diagnosis not present

## 2020-12-07 DIAGNOSIS — D6489 Other specified anemias: Secondary | ICD-10-CM | POA: Diagnosis not present

## 2020-12-07 DIAGNOSIS — J984 Other disorders of lung: Secondary | ICD-10-CM | POA: Diagnosis not present

## 2020-12-07 DIAGNOSIS — I9589 Other hypotension: Secondary | ICD-10-CM | POA: Diagnosis not present

## 2020-12-07 DIAGNOSIS — N200 Calculus of kidney: Secondary | ICD-10-CM | POA: Diagnosis not present

## 2020-12-07 DIAGNOSIS — R748 Abnormal levels of other serum enzymes: Secondary | ICD-10-CM | POA: Diagnosis not present

## 2020-12-07 DIAGNOSIS — R627 Adult failure to thrive: Secondary | ICD-10-CM | POA: Diagnosis not present

## 2020-12-07 DIAGNOSIS — K829 Disease of gallbladder, unspecified: Secondary | ICD-10-CM | POA: Diagnosis not present

## 2020-12-07 DIAGNOSIS — R7989 Other specified abnormal findings of blood chemistry: Secondary | ICD-10-CM | POA: Diagnosis not present

## 2020-12-07 DIAGNOSIS — E781 Pure hyperglyceridemia: Secondary | ICD-10-CM | POA: Diagnosis not present

## 2020-12-07 DIAGNOSIS — N183 Chronic kidney disease, stage 3 unspecified: Secondary | ICD-10-CM | POA: Diagnosis not present

## 2020-12-08 DIAGNOSIS — R634 Abnormal weight loss: Secondary | ICD-10-CM | POA: Diagnosis not present

## 2020-12-08 DIAGNOSIS — J439 Emphysema, unspecified: Secondary | ICD-10-CM | POA: Diagnosis not present

## 2020-12-08 DIAGNOSIS — D472 Monoclonal gammopathy: Secondary | ICD-10-CM | POA: Diagnosis not present

## 2020-12-08 DIAGNOSIS — J9611 Chronic respiratory failure with hypoxia: Secondary | ICD-10-CM | POA: Diagnosis not present

## 2020-12-08 DIAGNOSIS — I959 Hypotension, unspecified: Secondary | ICD-10-CM | POA: Diagnosis not present

## 2020-12-08 DIAGNOSIS — A0472 Enterocolitis due to Clostridium difficile, not specified as recurrent: Secondary | ICD-10-CM | POA: Diagnosis not present

## 2020-12-09 DIAGNOSIS — A0472 Enterocolitis due to Clostridium difficile, not specified as recurrent: Secondary | ICD-10-CM | POA: Diagnosis not present

## 2020-12-09 DIAGNOSIS — D472 Monoclonal gammopathy: Secondary | ICD-10-CM | POA: Diagnosis not present

## 2020-12-09 DIAGNOSIS — I959 Hypotension, unspecified: Secondary | ICD-10-CM | POA: Diagnosis not present

## 2020-12-09 DIAGNOSIS — J439 Emphysema, unspecified: Secondary | ICD-10-CM | POA: Diagnosis not present

## 2020-12-09 DIAGNOSIS — R634 Abnormal weight loss: Secondary | ICD-10-CM | POA: Diagnosis not present

## 2020-12-09 DIAGNOSIS — J9611 Chronic respiratory failure with hypoxia: Secondary | ICD-10-CM | POA: Diagnosis not present

## 2020-12-12 DIAGNOSIS — I959 Hypotension, unspecified: Secondary | ICD-10-CM | POA: Diagnosis not present

## 2020-12-12 DIAGNOSIS — D472 Monoclonal gammopathy: Secondary | ICD-10-CM | POA: Diagnosis not present

## 2020-12-12 DIAGNOSIS — J9611 Chronic respiratory failure with hypoxia: Secondary | ICD-10-CM | POA: Diagnosis not present

## 2020-12-12 DIAGNOSIS — J439 Emphysema, unspecified: Secondary | ICD-10-CM | POA: Diagnosis not present

## 2020-12-12 DIAGNOSIS — A0472 Enterocolitis due to Clostridium difficile, not specified as recurrent: Secondary | ICD-10-CM | POA: Diagnosis not present

## 2020-12-12 DIAGNOSIS — R634 Abnormal weight loss: Secondary | ICD-10-CM | POA: Diagnosis not present

## 2020-12-14 DIAGNOSIS — I959 Hypotension, unspecified: Secondary | ICD-10-CM | POA: Diagnosis not present

## 2020-12-14 DIAGNOSIS — R296 Repeated falls: Secondary | ICD-10-CM | POA: Diagnosis not present

## 2020-12-14 DIAGNOSIS — D472 Monoclonal gammopathy: Secondary | ICD-10-CM | POA: Diagnosis not present

## 2020-12-14 DIAGNOSIS — J9611 Chronic respiratory failure with hypoxia: Secondary | ICD-10-CM | POA: Diagnosis not present

## 2020-12-14 DIAGNOSIS — A0472 Enterocolitis due to Clostridium difficile, not specified as recurrent: Secondary | ICD-10-CM | POA: Diagnosis not present

## 2020-12-14 DIAGNOSIS — J439 Emphysema, unspecified: Secondary | ICD-10-CM | POA: Diagnosis not present

## 2020-12-14 DIAGNOSIS — R627 Adult failure to thrive: Secondary | ICD-10-CM | POA: Diagnosis not present

## 2020-12-14 DIAGNOSIS — R634 Abnormal weight loss: Secondary | ICD-10-CM | POA: Diagnosis not present

## 2020-12-30 DEATH — deceased
